# Patient Record
Sex: Female | Born: 1970 | Race: Black or African American | Hispanic: No | Marital: Married | Smoking: Never smoker
Health system: Southern US, Community
[De-identification: ages and names within clinical notes are randomized; demographics above are authoritative.]

## PROBLEM LIST (undated history)

## (undated) DIAGNOSIS — T8859XA Other complications of anesthesia, initial encounter: Secondary | ICD-10-CM

## (undated) DIAGNOSIS — N809 Endometriosis, unspecified: Secondary | ICD-10-CM

## (undated) DIAGNOSIS — M419 Scoliosis, unspecified: Secondary | ICD-10-CM

## (undated) DIAGNOSIS — D649 Anemia, unspecified: Secondary | ICD-10-CM

## (undated) DIAGNOSIS — G4700 Insomnia, unspecified: Secondary | ICD-10-CM

## (undated) DIAGNOSIS — IMO0002 Reserved for concepts with insufficient information to code with codable children: Secondary | ICD-10-CM

## (undated) DIAGNOSIS — K219 Gastro-esophageal reflux disease without esophagitis: Secondary | ICD-10-CM

## (undated) DIAGNOSIS — M797 Fibromyalgia: Secondary | ICD-10-CM

## (undated) DIAGNOSIS — T4145XA Adverse effect of unspecified anesthetic, initial encounter: Secondary | ICD-10-CM

## (undated) DIAGNOSIS — I499 Cardiac arrhythmia, unspecified: Secondary | ICD-10-CM

## (undated) DIAGNOSIS — E7800 Pure hypercholesterolemia, unspecified: Secondary | ICD-10-CM

## (undated) DIAGNOSIS — E282 Polycystic ovarian syndrome: Secondary | ICD-10-CM

## (undated) DIAGNOSIS — G8929 Other chronic pain: Secondary | ICD-10-CM

## (undated) DIAGNOSIS — R252 Cramp and spasm: Secondary | ICD-10-CM

## (undated) DIAGNOSIS — M1990 Unspecified osteoarthritis, unspecified site: Secondary | ICD-10-CM

## (undated) DIAGNOSIS — F419 Anxiety disorder, unspecified: Secondary | ICD-10-CM

## (undated) DIAGNOSIS — D219 Benign neoplasm of connective and other soft tissue, unspecified: Secondary | ICD-10-CM

## (undated) DIAGNOSIS — D573 Sickle-cell trait: Secondary | ICD-10-CM

## (undated) HISTORY — PX: BIOPSY BREAST: PRO8

## (undated) HISTORY — PX: WISDOM TOOTH EXTRACTION: SHX21

## (undated) HISTORY — DX: Endometriosis, unspecified: N80.9

## (undated) HISTORY — PX: LAPAROSCOPY: SHX197

---

## 1997-11-11 ENCOUNTER — Emergency Department (HOSPITAL_COMMUNITY): Admission: EM | Admit: 1997-11-11 | Discharge: 1997-11-11 | Payer: Self-pay | Admitting: Emergency Medicine

## 1998-07-02 ENCOUNTER — Other Ambulatory Visit: Admission: RE | Admit: 1998-07-02 | Discharge: 1998-07-02 | Payer: Self-pay | Admitting: Family Medicine

## 1999-08-10 ENCOUNTER — Other Ambulatory Visit: Admission: RE | Admit: 1999-08-10 | Discharge: 1999-08-10 | Payer: Self-pay | Admitting: *Deleted

## 2000-08-09 ENCOUNTER — Other Ambulatory Visit: Admission: RE | Admit: 2000-08-09 | Discharge: 2000-08-09 | Payer: Self-pay | Admitting: Family Medicine

## 2000-09-27 ENCOUNTER — Emergency Department (HOSPITAL_COMMUNITY): Admission: EM | Admit: 2000-09-27 | Discharge: 2000-09-27 | Payer: Self-pay | Admitting: Emergency Medicine

## 2000-09-28 ENCOUNTER — Encounter: Payer: Self-pay | Admitting: Emergency Medicine

## 2001-01-24 ENCOUNTER — Ambulatory Visit (HOSPITAL_COMMUNITY): Admission: RE | Admit: 2001-01-24 | Discharge: 2001-01-24 | Payer: Self-pay | Admitting: Obstetrics and Gynecology

## 2001-01-24 ENCOUNTER — Encounter (INDEPENDENT_AMBULATORY_CARE_PROVIDER_SITE_OTHER): Payer: Self-pay

## 2001-05-12 ENCOUNTER — Emergency Department (HOSPITAL_COMMUNITY): Admission: EM | Admit: 2001-05-12 | Discharge: 2001-05-13 | Payer: Self-pay | Admitting: Emergency Medicine

## 2001-05-13 ENCOUNTER — Encounter: Payer: Self-pay | Admitting: Emergency Medicine

## 2001-06-10 ENCOUNTER — Other Ambulatory Visit: Admission: RE | Admit: 2001-06-10 | Discharge: 2001-06-10 | Payer: Self-pay | Admitting: Obstetrics and Gynecology

## 2001-08-21 ENCOUNTER — Encounter: Admission: RE | Admit: 2001-08-21 | Discharge: 2001-11-19 | Payer: Self-pay | Admitting: Family Medicine

## 2001-08-29 ENCOUNTER — Emergency Department (HOSPITAL_COMMUNITY): Admission: EM | Admit: 2001-08-29 | Discharge: 2001-08-30 | Payer: Self-pay | Admitting: Emergency Medicine

## 2001-11-12 ENCOUNTER — Emergency Department (HOSPITAL_COMMUNITY): Admission: EM | Admit: 2001-11-12 | Discharge: 2001-11-12 | Payer: Self-pay | Admitting: Emergency Medicine

## 2001-11-12 ENCOUNTER — Encounter: Payer: Self-pay | Admitting: Emergency Medicine

## 2002-08-14 ENCOUNTER — Encounter: Payer: Self-pay | Admitting: Emergency Medicine

## 2002-08-14 ENCOUNTER — Emergency Department (HOSPITAL_COMMUNITY): Admission: EM | Admit: 2002-08-14 | Discharge: 2002-08-14 | Payer: Self-pay | Admitting: Emergency Medicine

## 2003-01-02 ENCOUNTER — Encounter: Admission: RE | Admit: 2003-01-02 | Discharge: 2003-01-02 | Payer: Self-pay | Admitting: Family Medicine

## 2003-01-02 ENCOUNTER — Encounter: Payer: Self-pay | Admitting: Family Medicine

## 2003-03-10 ENCOUNTER — Emergency Department (HOSPITAL_COMMUNITY): Admission: EM | Admit: 2003-03-10 | Discharge: 2003-03-10 | Payer: Self-pay | Admitting: Emergency Medicine

## 2003-04-04 ENCOUNTER — Emergency Department (HOSPITAL_COMMUNITY): Admission: EM | Admit: 2003-04-04 | Discharge: 2003-04-05 | Payer: Self-pay | Admitting: Emergency Medicine

## 2003-08-10 ENCOUNTER — Ambulatory Visit (HOSPITAL_COMMUNITY): Admission: RE | Admit: 2003-08-10 | Discharge: 2003-08-10 | Payer: Self-pay | Admitting: Obstetrics and Gynecology

## 2003-08-11 ENCOUNTER — Encounter: Admission: RE | Admit: 2003-08-11 | Discharge: 2003-11-09 | Payer: Self-pay | Admitting: Obstetrics and Gynecology

## 2004-06-15 ENCOUNTER — Emergency Department (HOSPITAL_COMMUNITY): Admission: EM | Admit: 2004-06-15 | Discharge: 2004-06-15 | Payer: Self-pay | Admitting: Emergency Medicine

## 2005-02-09 ENCOUNTER — Encounter: Admission: RE | Admit: 2005-02-09 | Discharge: 2005-03-06 | Payer: Self-pay | Admitting: Family Medicine

## 2005-02-15 ENCOUNTER — Ambulatory Visit (HOSPITAL_COMMUNITY): Admission: RE | Admit: 2005-02-15 | Discharge: 2005-02-15 | Payer: Self-pay | Admitting: Obstetrics and Gynecology

## 2005-05-23 ENCOUNTER — Emergency Department (HOSPITAL_COMMUNITY): Admission: EM | Admit: 2005-05-23 | Discharge: 2005-05-23 | Payer: Self-pay | Admitting: Emergency Medicine

## 2005-07-12 ENCOUNTER — Emergency Department (HOSPITAL_COMMUNITY): Admission: EM | Admit: 2005-07-12 | Discharge: 2005-07-13 | Payer: Self-pay | Admitting: Emergency Medicine

## 2005-09-05 ENCOUNTER — Encounter: Admission: RE | Admit: 2005-09-05 | Discharge: 2005-09-05 | Payer: Self-pay | Admitting: Sports Medicine

## 2005-09-21 ENCOUNTER — Encounter: Admission: RE | Admit: 2005-09-21 | Discharge: 2005-09-21 | Payer: Self-pay | Admitting: Sports Medicine

## 2005-10-12 ENCOUNTER — Encounter: Admission: RE | Admit: 2005-10-12 | Discharge: 2005-10-12 | Payer: Self-pay | Admitting: Sports Medicine

## 2006-05-01 HISTORY — PX: TOE SURGERY: SHX1073

## 2006-05-18 ENCOUNTER — Encounter: Admission: RE | Admit: 2006-05-18 | Discharge: 2006-05-18 | Payer: Self-pay | Admitting: Sports Medicine

## 2006-06-07 ENCOUNTER — Encounter: Admission: RE | Admit: 2006-06-07 | Discharge: 2006-06-07 | Payer: Self-pay | Admitting: Sports Medicine

## 2006-11-01 ENCOUNTER — Emergency Department (HOSPITAL_COMMUNITY): Admission: EM | Admit: 2006-11-01 | Discharge: 2006-11-01 | Payer: Self-pay | Admitting: Emergency Medicine

## 2006-12-24 ENCOUNTER — Emergency Department (HOSPITAL_COMMUNITY): Admission: EM | Admit: 2006-12-24 | Discharge: 2006-12-24 | Payer: Self-pay | Admitting: Family Medicine

## 2007-04-18 ENCOUNTER — Encounter: Admission: RE | Admit: 2007-04-18 | Discharge: 2007-04-18 | Payer: Self-pay | Admitting: Sports Medicine

## 2007-11-09 ENCOUNTER — Emergency Department (HOSPITAL_COMMUNITY): Admission: EM | Admit: 2007-11-09 | Discharge: 2007-11-09 | Payer: Self-pay | Admitting: Family Medicine

## 2007-11-12 ENCOUNTER — Other Ambulatory Visit: Admission: RE | Admit: 2007-11-12 | Discharge: 2007-11-12 | Payer: Self-pay | Admitting: Obstetrics and Gynecology

## 2007-12-14 ENCOUNTER — Emergency Department (HOSPITAL_COMMUNITY): Admission: EM | Admit: 2007-12-14 | Discharge: 2007-12-14 | Payer: Self-pay | Admitting: Family Medicine

## 2008-03-03 ENCOUNTER — Encounter: Admission: RE | Admit: 2008-03-03 | Discharge: 2008-03-03 | Payer: Self-pay | Admitting: Orthopaedic Surgery

## 2008-04-03 ENCOUNTER — Ambulatory Visit (HOSPITAL_COMMUNITY): Admission: RE | Admit: 2008-04-03 | Discharge: 2008-04-03 | Payer: Self-pay | Admitting: Obstetrics and Gynecology

## 2008-04-28 ENCOUNTER — Encounter: Admission: RE | Admit: 2008-04-28 | Discharge: 2008-04-28 | Payer: Self-pay | Admitting: Obstetrics and Gynecology

## 2008-05-01 HISTORY — PX: BACK SURGERY: SHX140

## 2008-05-27 ENCOUNTER — Encounter: Admission: RE | Admit: 2008-05-27 | Discharge: 2008-05-27 | Payer: Self-pay | Admitting: Orthopaedic Surgery

## 2008-07-12 ENCOUNTER — Emergency Department (HOSPITAL_COMMUNITY): Admission: EM | Admit: 2008-07-12 | Discharge: 2008-07-12 | Payer: Self-pay | Admitting: Family Medicine

## 2008-07-14 ENCOUNTER — Encounter: Admission: RE | Admit: 2008-07-14 | Discharge: 2008-07-14 | Payer: Self-pay | Admitting: Orthopaedic Surgery

## 2008-07-30 ENCOUNTER — Encounter: Admission: RE | Admit: 2008-07-30 | Discharge: 2008-07-30 | Payer: Self-pay | Admitting: Neurosurgery

## 2008-09-10 ENCOUNTER — Inpatient Hospital Stay (HOSPITAL_COMMUNITY): Admission: RE | Admit: 2008-09-10 | Discharge: 2008-09-14 | Payer: Self-pay | Admitting: Neurosurgery

## 2008-12-06 ENCOUNTER — Emergency Department (HOSPITAL_COMMUNITY): Admission: EM | Admit: 2008-12-06 | Discharge: 2008-12-06 | Payer: Self-pay | Admitting: Emergency Medicine

## 2009-04-29 ENCOUNTER — Encounter: Admission: RE | Admit: 2009-04-29 | Discharge: 2009-04-29 | Payer: Self-pay | Admitting: Obstetrics and Gynecology

## 2009-06-06 ENCOUNTER — Emergency Department (HOSPITAL_COMMUNITY): Admission: EM | Admit: 2009-06-06 | Discharge: 2009-06-06 | Payer: Self-pay | Admitting: Family Medicine

## 2009-06-23 ENCOUNTER — Emergency Department (HOSPITAL_COMMUNITY): Admission: EM | Admit: 2009-06-23 | Discharge: 2009-06-23 | Payer: Self-pay | Admitting: Family Medicine

## 2009-10-07 ENCOUNTER — Emergency Department (HOSPITAL_COMMUNITY): Admission: EM | Admit: 2009-10-07 | Discharge: 2009-10-07 | Payer: Self-pay | Admitting: Emergency Medicine

## 2009-12-02 ENCOUNTER — Emergency Department (HOSPITAL_COMMUNITY): Admission: EM | Admit: 2009-12-02 | Discharge: 2009-12-02 | Payer: Self-pay | Admitting: Emergency Medicine

## 2010-04-14 ENCOUNTER — Observation Stay (HOSPITAL_COMMUNITY)
Admission: EM | Admit: 2010-04-14 | Discharge: 2010-04-15 | Payer: Self-pay | Source: Home / Self Care | Attending: Internal Medicine | Admitting: Internal Medicine

## 2010-04-15 ENCOUNTER — Encounter (INDEPENDENT_AMBULATORY_CARE_PROVIDER_SITE_OTHER): Payer: Self-pay | Admitting: Internal Medicine

## 2010-05-22 ENCOUNTER — Encounter: Payer: Self-pay | Admitting: Sports Medicine

## 2010-05-22 ENCOUNTER — Encounter: Payer: Self-pay | Admitting: Obstetrics and Gynecology

## 2010-07-10 ENCOUNTER — Inpatient Hospital Stay (INDEPENDENT_AMBULATORY_CARE_PROVIDER_SITE_OTHER)
Admission: RE | Admit: 2010-07-10 | Discharge: 2010-07-10 | Disposition: A | Discharge status: Home / Self Care | Payer: BC Managed Care – PPO | Source: Ambulatory Visit | Attending: Family Medicine | Admitting: Family Medicine

## 2010-07-10 DIAGNOSIS — M549 Dorsalgia, unspecified: Secondary | ICD-10-CM

## 2010-07-11 LAB — COMPREHENSIVE METABOLIC PANEL
Alkaline Phosphatase: 110 U/L (ref 39–117)
BUN: 8 mg/dL (ref 6–23)
CO2: 24 mEq/L (ref 19–32)
Chloride: 102 mEq/L (ref 96–112)
GFR calc non Af Amer: >60 mL/min (ref >60)
Glucose, Bld: 432 mg/dL — ABNORMAL HIGH (ref 70–99)
Potassium: 4.3 mEq/L (ref 3.5–5.1)
Total Bilirubin: 0.3 mg/dL (ref 0.3–1.2)

## 2010-07-11 LAB — CBC
HCT: 38.4 % (ref 36.0–46.0)
MCV: 82.9 fL (ref 78.0–100.0)
RBC: 4.63 MIL/uL (ref 3.87–5.11)
WBC: 8.3 10*3/uL (ref 4.0–10.5)

## 2010-07-11 LAB — LIPID PANEL
LDL Cholesterol: 149 mg/dL — ABNORMAL HIGH (ref 0–99)
Total CHOL/HDL Ratio: 6 RATIO
VLDL: 25 mg/dL (ref 0–40)

## 2010-07-11 LAB — CK TOTAL AND CKMB (NOT AT ARMC)
Relative Index: INVALID (ref 0.0–2.5)
Total CK: 74 U/L (ref 7–177)

## 2010-07-11 LAB — CARDIAC PANEL(CRET KIN+CKTOT+MB+TROPI)
CK, MB: 1.1 ng/mL (ref 0.3–4.0)
Relative Index: INVALID (ref 0.0–2.5)
Relative Index: INVALID (ref 0.0–2.5)
Troponin I: 0.01 ng/mL (ref 0.00–0.06)
Troponin I: 0.01 ng/mL (ref 0.00–0.06)

## 2010-07-11 LAB — HEMOGLOBIN A1C
Hgb A1c MFr Bld: 10.9 % — ABNORMAL HIGH (ref <5.7)
Mean Plasma Glucose: 266 mg/dL — ABNORMAL HIGH (ref <117)

## 2010-07-11 LAB — GLUCOSE, CAPILLARY
Glucose-Capillary: 177 mg/dL — ABNORMAL HIGH (ref 70–99)
Glucose-Capillary: 280 mg/dL — ABNORMAL HIGH (ref 70–99)
Glucose-Capillary: 419 mg/dL — ABNORMAL HIGH (ref 70–99)

## 2010-07-11 LAB — POCT CARDIAC MARKERS: Myoglobin, poc: 59.5 ng/mL (ref 12–200)

## 2010-07-11 LAB — SAMPLE TO BLOOD BANK

## 2010-07-20 LAB — GLUCOSE, CAPILLARY

## 2010-08-06 LAB — CBC
Hemoglobin: 10.9 g/dL — ABNORMAL LOW (ref 12.0–15.0)
MCHC: 32 g/dL (ref 30.0–36.0)
MCV: 81.5 fL (ref 78.0–100.0)
RBC: 4.17 MIL/uL (ref 3.87–5.11)

## 2010-08-06 LAB — POCT I-STAT, CHEM 8
Calcium, Ion: 1.13 mmol/L (ref 1.12–1.32)
Chloride: 104 mEq/L (ref 96–112)
Glucose, Bld: 150 mg/dL — ABNORMAL HIGH (ref 70–99)
HCT: 34 % — ABNORMAL LOW (ref 36.0–46.0)
Hemoglobin: 11.6 g/dL — ABNORMAL LOW (ref 12.0–15.0)

## 2010-08-06 LAB — GLUCOSE, CAPILLARY

## 2010-08-06 LAB — URINALYSIS, ROUTINE W REFLEX MICROSCOPIC
Bilirubin Urine: NEGATIVE
Hgb urine dipstick: NEGATIVE
Specific Gravity, Urine: 1.013 (ref 1.005–1.030)
pH: 6 (ref 5.0–8.0)

## 2010-08-06 LAB — POCT CARDIAC MARKERS: Troponin i, poc: <0.05 ng/mL (ref 0.00–0.09)

## 2010-08-06 LAB — DIFFERENTIAL
Basophils Relative: 2 % — ABNORMAL HIGH (ref 0–1)
Eosinophils Absolute: 0.2 10*3/uL (ref 0.0–0.7)
Monocytes Absolute: 0.5 10*3/uL (ref 0.1–1.0)
Monocytes Relative: 6 % (ref 3–12)

## 2010-08-09 LAB — GLUCOSE, CAPILLARY
Glucose-Capillary: 124 mg/dL — ABNORMAL HIGH (ref 70–99)
Glucose-Capillary: 147 mg/dL — ABNORMAL HIGH (ref 70–99)
Glucose-Capillary: 138 mg/dL — ABNORMAL HIGH (ref 70–99)
Glucose-Capillary: 149 mg/dL — ABNORMAL HIGH (ref 70–99)
Glucose-Capillary: 166 mg/dL — ABNORMAL HIGH (ref 70–99)
Glucose-Capillary: 170 mg/dL — ABNORMAL HIGH (ref 70–99)
Glucose-Capillary: 173 mg/dL — ABNORMAL HIGH (ref 70–99)

## 2010-08-09 LAB — CBC
MCHC: 32.9 g/dL (ref 30.0–36.0)
Platelets: 387 10*3/uL (ref 150–400)
RBC: 4.33 MIL/uL (ref 3.87–5.11)
RDW: 16 % — ABNORMAL HIGH (ref 11.5–15.5)

## 2010-08-09 LAB — BASIC METABOLIC PANEL
BUN: 6 mg/dL (ref 6–23)
CO2: 26 mEq/L (ref 19–32)
Calcium: 9 mg/dL (ref 8.4–10.5)
Creatinine, Ser: 0.8 mg/dL (ref 0.4–1.2)
GFR calc Af Amer: >60 mL/min (ref >60)

## 2010-08-09 LAB — URINALYSIS, MICROSCOPIC ONLY
Nitrite: NEGATIVE
Protein, ur: NEGATIVE mg/dL
Specific Gravity, Urine: 1.014 (ref 1.005–1.030)
Urobilinogen, UA: 1 mg/dL (ref 0.0–1.0)

## 2010-08-09 LAB — PROTIME-INR
INR: 1 (ref 0.00–1.49)
Prothrombin Time: 13.2 seconds (ref 11.6–15.2)

## 2010-08-09 LAB — TYPE AND SCREEN: Antibody Screen: NEGATIVE

## 2010-09-03 ENCOUNTER — Emergency Department (HOSPITAL_COMMUNITY)
Admission: EM | Admit: 2010-09-03 | Discharge: 2010-09-03 | Disposition: A | Discharge status: Home / Self Care | Payer: BC Managed Care – PPO | Attending: Emergency Medicine | Admitting: Emergency Medicine

## 2010-09-03 DIAGNOSIS — M545 Low back pain, unspecified: Secondary | ICD-10-CM | POA: Insufficient documentation

## 2010-09-03 DIAGNOSIS — N390 Urinary tract infection, site not specified: Secondary | ICD-10-CM | POA: Insufficient documentation

## 2010-09-03 DIAGNOSIS — Z79899 Other long term (current) drug therapy: Secondary | ICD-10-CM | POA: Insufficient documentation

## 2010-09-03 DIAGNOSIS — E119 Type 2 diabetes mellitus without complications: Secondary | ICD-10-CM | POA: Insufficient documentation

## 2010-09-03 DIAGNOSIS — Z794 Long term (current) use of insulin: Secondary | ICD-10-CM | POA: Insufficient documentation

## 2010-09-03 LAB — POCT PREGNANCY, URINE: Preg Test, Ur: NEGATIVE

## 2010-09-03 LAB — URINALYSIS, ROUTINE W REFLEX MICROSCOPIC
Ketones, ur: NEGATIVE mg/dL
Leukocytes, UA: NEGATIVE
Nitrite: POSITIVE — AB
Protein, ur: NEGATIVE mg/dL

## 2010-09-05 LAB — URINE CULTURE
Colony Count: 75000
Culture  Setup Time: 201205061158

## 2010-09-13 NOTE — Op Note (Signed)
NAMERONESHIA, DREW               ACCOUNT NO.:  [id number]   MEDICAL RECORD NO.:  [id number]          PATIENT TYPE:  INP   LOCATION:  3025                         FACILITY:  [facility]   PHYSICIAN:  Clydene Fake, M.D.  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   PROCEDURES:  Decompressive laminectomy; decompression of the L5 and S1  roots (2 levels); posterior lumbar interbody fusion, L5-S1; Saber  interbody cages, L5-S1; Expedium pedicle screw fixation, nonsegmented,  L5-S1; posterolateral fusion, L5-S1; autograft, same incision; and  Infuse bone morphogenetic protein.   SURGEON:  Clydene Fake, MD   ASSISTANT:  Hilda Lias, MD   General endotracheal tube anesthesia.   ESTIMATED BLOOD LOSS:  150 cc   BLOOD GIVEN:  None.   DRAINS:  None.   COMPLICATIONS:  None.   REASON FOR PROCEDURE:  The patient is a 40 year old woman who has had a  long history of chronic back pain, treated with epidural injections, but  has had acute and sudden radiculopathy, right leg pain on top that with  increasing back pain.  She had an epidural injection for chronic pain  just prior to leg pain coming on and MRI was done showing severe  spondylitic changes at L5-S1, broad-based disk bulging, spurring, and  probably new acute disk herniation to the right side at L5-S1.  The  patient has multilevel spinal changes also.  After much discussion,  decided to bring the patient for decompressive laminectomy, diskectomy,  and fusion of L5-S1 level.   PROCEDURE IN DETAIL:  The patient was brought into the  operating room  and general anesthesia was induced.  The patient was placed in the prone  position on a Wilson frame with all pressure points padded.  The patient  was prepped and draped in sterile fashion.  The site of incision was  injected with 20 cc of 1% lidocaine with epinephrine.  Incision was  made.  Incision taken down to the fascia.  Hemostasis obtained with  Bovie cauterization.  Fascia was incised at L5 and S1 spinous process  and subperiosteal dissection was done over the lamina out to the facets,  markers were placed at the interspace.  X-rays were obtained confirming  our positioning at L5-S1 on the contralateral side.  We then did a  subperiosteal dissection and we dissected out laterally, so we can see  the facet at L5-S1, the transverse processes of both L5 and on the  lateral sacrum.  We brought fluoro and put markers at the pedicles and  the interspace again confirmed our positioning.  Decompressive  laminectomy was then done removing the spinous processes and most of the  lamina of L5 and medial facetectomy.  We decompressed the the central  canal and decompressed the  L5 and S1 roots, did a significant  foraminotomy over the S1 roots.  We explored disk space in front, broad-  based and severely spondylitic disk space and inside of the space.  We  started the diskectomy with pituitary rongeurs.  We removed the  osteophytes with osteophyte tools and curettes.  We were able to get  interspace clearing and distracted the interspace up to 7 mm in each  side and then continued the diskectomy with curettes, pituitary  rongeurs.  We spent significant amount of time trying to remove the bony  lip of the disk space that went off medially, working from each side.  Doing this we decompressed the nerve roots  Finally we still had a  midline ridge, but we had good decompression of nerve roots and thecal  sac.  We then explored the space through the axilla under the S1 root  and on  the right side found free fragment of disk and we removed this.  At this point, we had good decompression of the nerve roots.  We then  packed two 7-mm Saber cages with Infuse BNP and autograft bone, all the  bone that was removed during laminectomy, so this was cleaned from its  soft tissue, chopped in small pieces and this was then also packed in  the interspace, and then we tapped the Saber cages into place, one in  each side.  They were in good position, firmly in place and we used high-  speed drill to decorticate facet, lateral sacrum, transverse process of  L5 in each side, then used drill under fluoroscopic guidance to drill  entry point for pedicle screws for L5 and S1, and placed a probe down  the pedicle, tapped the hole, used a small ball probe to make sure we  had bony circumference.  We then placed pedicle screws, Expedium screws  were used and used 6-mm wide screws and 50-mm screws were used at L5, 45-  mm screw was used in the right side at S1 and 40-mm screw at the left  side.  After screws are placed, we took final AP and lateral  fluoroscopic imaging showing good position of the interbody cages and  pedicle screws.  We placed Infuse BNP and bone graft in the  posterolateral gutters for posterolateral fusion at L5 to S1, placed the  rods in the screw heads, placed locking nuts and then final tightened  the nuts over the all 4 screws.  Retractors were removed and again  explored dura, nerve roots, both L5 and S1 roots were well-decompressed.  Placed some Gelfoam over the lateral gutters so no bone graft would  impinge on the nerve roots and the fascia was closed with 0 Vicryl  interrupted sutures.  Subcutaneous tissue closed with 0, 2-0, and 3-0  Vicryl interrupted sutures.  Skin closed with Benzoin, Steri-Strips,  dressing was placed.  The patient was placed back in a supine position,  awoken from anesthesia, and transferred to recovery room in stable  condition.            ______________________________  Clydene Fake, M.D.     JRH/MEDQ  D:  09/10/2008  T:  09/11/2008  Job:  [id number]

## 2010-09-16 NOTE — Op Note (Signed)
[facility]  Patient:    Karen Stein, Karen Stein Visit Number: [id number] MRN: [id number]          Service Type: DSU Location: [facility] Attending Physician:  Jaymes Graff A Dictated by:   Pierre Bali Normand Sloop, M.D. Admit Date:  01/24/2001                             Operative Report  PREOPERATIVE DIAGNOSIS:       Chronic pelvic pain.  POSTOPERATIVE DIAGNOSES:      1. Multiple omental-abdominal wall adhesions.                               2. Implants in the posterior cul-de-sac and left                                  abdominal wall along the ureter consistent                                  with endometriosis.  PROCEDURES:                   1. Diagnostic laparoscopy.                               2. Biopsy of the posterior cul-de-sac and right                                  uterosacral ligament implants.  SURGEON:                      Naima A. Normand Sloop, M.D.  ASSISTANT:                    Maris Berger. Pennie Rushing, M.D.  ANESTHESIA:                   General.  ESTIMATED BLOOD LOSS:         Minimal.  IV FLUIDS:                    2100 cc of crystalloid.  URINE OUTPUT:                 100 cc clear urine.  FINDINGS:                     Small implants in the posterior cul-de-sac and right uterosacral ligaments.  Also a white implant consistent with endometriosis along the left ureter.  Normal-appearing appendix, right upper quadrant, gallbladder and liver.  Multiple thick omental-abdominal wall adhesions.  Normal-appearing uterus, tubes and ovaries bilaterally.  COMPLICATIONS:                None.  DISPOSITION:                  The patient to the recovery room in stable condition.  PROCEDURE IN DETAIL:          The risks and benefits of the procedure were reviewed with the patient.  The risk of bleeding, infection and damage to abdominal organs such as bowel and  bladder.  She still wished to proceed with the procedure.  The patient was taken to the  operating room and placed in the dorsal lithotomy position.  She was prepped and draped in the normal sterile fashion.  A bivalve speculum was then placed in the vagina.  The cervix was grasped with a single-tooth tenaculum.  The uterus was then sounded to 8 cm. The ______ was then placed after the sound was removed without difficulty as a means to manipulate the uterus.  Attention was then turned to the patients umbilicus, where a vertical 10-mm infraumbilical incision was made with a knife.  A Veress needle was placed at a 45-degree angle while tenting the abdominal wall.  Intra-abdominal placement was confirmed with fluid-filled syringe.  The abdomen was then insufflated with CO2 gas.  Upon starting the gas, the pressure was ______ mmHg.  The abdomen was insufflated with about 3.5 L of CO2 gas.  The Veress neelde was removed.  A 10-mm trocar was placed at a 45-degree angle while tenting the abdominal wall.  Intra-abomdinal placement was confirmed with the laparoscope.  The findings noted above were seen.  Multiple omental adhesions toward the patients left side an on down toward the lower abdomen.  They were dense adhesions and could not be taken down safely laparoscopically.  Maneuvering through the adhesions, I was to see both ovaries and tubes, which appeared normal.  The anterior cul-de-sac, which did have some small, filmy adhesions, was taken down, for the most part bluntly.  Normal bladder.  The posterior cul-de-sac and along the right uterosacral ligament, there were small, powderburn implants, each about 1 mm in size.  Biopsy was done on that side.  the laparoscope was then panned on the right side and we were able to find the appendix, which was normal in appearance.  The liver and gallbladder seemed normal in appearance.  Because of dense adhesions, we were able to see the stomach or further up in the left upper quadrant.  There was, however, a white implant along the ureter on  the left.  Also, there was a small amount of fluid in the cul-de-sac, which was drained.  Once the fluid was drained, there still was no abnormality seen except for the areas with endometriosis.  The biopsy was done with biopsy forceps and hemostasis was noted.  There were three biopsies taken.  No biopsies were taken of the left implant secondary to the placement right along the left ureter.  The CO2 gas was then allowed to escape from the trocar.  The trocar was removed under direct vision of the laparoscope.  There was noted to be no entrance into bowel just entrance into some adhesions, which were hemostatic.  No injury was noted.  The 10-mm port was closed using 0 Vicryl on a urology needle.  Hemostasis was assured.  The skin was closed with 3-0 Vicryl in a subcutaneous stitch.  Hemostasis was assured.  The ______ was removed and no serious bleeding from the vagina or from the tenaculum site. Sponge, lap, and needle counts were correct x 2.  The patient went to the recovery room in stable condition.  The patient will be offered Lupron before she leaves the hospital and, if she refuses, will be offered Lupron at her postoperative visit. Dictated by:   Pierre Bali. Normand Sloop, M.D. Attending Physician:  Michael Litter DD:  01/24/01 TD:  01/24/01 Job: [id number]

## 2010-09-16 NOTE — H&P (Signed)
[facility]  Patient:    Karen Stein, Karen Stein Visit Number: [id number] MRN: [id number]          Service Type: Attending:  Naima A. Normand Sloop, M.D. Dictated by:   Pierre Bali. Normand Sloop, M.D.                           History and Physical  HISTORY OF PRESENT ILLNESS:   The patient is a 40 year old African-American female G2, P0-1-1 who presents with chronic pelvic pain unrelieved by Naprosyn or other pain medications for 2-3 years.  The patient also complains of dyspareunia.  The pain is constant.  She missed one period in June 2002.  A pregnancy test was negative.  Ultrasound revealed a very small fibroid.  The patient has taken Tylenol and hydrocodone with Naprosyn off and on for one month without relief.  She does have some nausea, no vomiting, no change in bowel habits.  The patient denies any history of PID or sexually transmitted disease or history of abnormal Pap.  She does have dysmenorrhea.  PAST MEDICAL HISTORY:         Reflux for several years.  PAST SURGICAL HISTORY:        C-section in 1995.  ALLERGIES:                    SULFA.  FAMILY HISTORY:               Hypertension and diabetes.  No GYN cancer.  SOCIAL HISTORY:               Negative x 2 with occasional alcohol use.  PHYSICAL EXAMINATION:  VITAL SIGNS:                  On December 24, 2000, the patients blood pressure was 118/70, her weight was 240 pounds.  HEENT:                        Within normal limits.  HEART:                        Regular.  LUNGS:                        Clear.  ABDOMEN:                      Soft and nontender.  BACK:                         No CVA tenderness.  EXTREMITIES:                  No cyanosis, clubbing, or edema.  GENITOURINARY:                The patients ______ vaginal was within normal limits.  Cervix:  She had no CMT and no uterine tenderness.  Uterus was normal size and consistency, nontender.  Adnexa had no masses or  tenderness. GC/chlamydia as of April 2002 was within normal limits.  IMPRESSION:                   The patient has chronic pelvic pain was scheduled for a diagnostic laparoscopy.  The patient was given Naprosyn to help alleviate the pain.  I told her to stop if the reflux became worse.  At the visit, the patient was diagnosed with bacterial vaginosis and was treated with MetroGel.  The patient is for surgery on January 24, 2001. Dictated by:   Pierre Bali. Normand Sloop, M.D. Attending:  Naima A. Dillard, M.D. DD:  01/21/01 TD:  01/21/01 Job: [id number]

## 2010-09-16 NOTE — Discharge Summary (Signed)
Karen Stein, Karen Stein               ACCOUNT NO.:  [id number]   MEDICAL RECORD NO.:  [id number]          PATIENT TYPE:  INP   LOCATION:  3025                         FACILITY:  [facility]   PHYSICIAN:  Clydene Fake, M.D.  DATE OF BIRTH:  03-17-1971   DATE OF ADMISSION:  09/10/2008  DATE OF DISCHARGE:  09/14/2008                               DISCHARGE SUMMARY   DIAGNOSES:  Degenerative disk disease with herniated nucleus pulposus,  with radiculopathy and spondylosis, middle recess stenosis of L5-S1.   DISCHARGE DIAGNOSES:  Degenerative disk disease with herniated nucleus  pulposus, with radiculopathy and spondylosis, middle recess stenosis of  L5-S1.   PROCEDURE:  Decompression  laminectomy in L5-S1 roots with PLIF L5-S1  with Saber interbody cages, Expedium pedicle screw fixation and  posterolateral fusion with Infuse.   REASON FOR ADMISSION:  The patient is a 40 year old woman with long  history of chronic back pain treated with epidural injections,  had  sudden acute radiculopathy and right leg pain.  MRI was done showing  spondylotic changes at L5-S1 and new acute large disk herniation right  side L5-S1.  The patient was brought in for decompression and fusion at  L5-S1 level.   HOSPITAL COURSE:  The patient admitted today for surgery and underwent  the procedure without complications.  Postop, the patient was  transferred to the recovery room and then to the floor where she slowly  increased ambulating.  PT/OT to work in increasing her activity.  She  continued making progress.  Incision remained clean, dry, and intact;  switched from PCA meds to p.o. meds, but she continued improving and by  Sep 14, 2008, she had a bowel movement, and she was eating well, up  ambulating, some moderate incisional pain, but doing well, treated with  p.o. meds.  The patient was discharged to home in stable condition.  Discharge meds same as prehospitalization plus Percocet and Flexeril   p.r.n.   No strenuous activity, up with brace and follow up in 3-4 weeks in my  office.           ______________________________  Clydene Fake, M.D.     JRH/MEDQ  D:  10/08/2008  T:  10/08/2008  Job:  [id number]

## 2010-12-04 ENCOUNTER — Inpatient Hospital Stay (INDEPENDENT_AMBULATORY_CARE_PROVIDER_SITE_OTHER)
Admission: RE | Admit: 2010-12-04 | Discharge: 2010-12-04 | Disposition: A | Discharge status: Home / Self Care | Payer: BC Managed Care – PPO | Source: Ambulatory Visit | Attending: Family Medicine | Admitting: Family Medicine

## 2010-12-04 DIAGNOSIS — M549 Dorsalgia, unspecified: Secondary | ICD-10-CM

## 2010-12-04 DIAGNOSIS — E119 Type 2 diabetes mellitus without complications: Secondary | ICD-10-CM

## 2010-12-04 LAB — POCT URINALYSIS DIP (DEVICE)
Bilirubin Urine: NEGATIVE
Glucose, UA: >=1000 mg/dL — AB
Leukocytes, UA: NEGATIVE
Nitrite: NEGATIVE
Urobilinogen, UA: 0.2 mg/dL (ref 0.0–1.0)
pH: 5 (ref 5.0–8.0)

## 2010-12-04 LAB — POCT I-STAT, CHEM 8
Calcium, Ion: 1.13 mmol/L (ref 1.12–1.32)
Chloride: 100 mEq/L (ref 96–112)
HCT: 42 % (ref 36.0–46.0)
Hemoglobin: 14.3 g/dL (ref 12.0–15.0)

## 2010-12-21 ENCOUNTER — Emergency Department (HOSPITAL_COMMUNITY)
Admission: EM | Admit: 2010-12-21 | Discharge: 2010-12-21 | Disposition: A | Discharge status: Home / Self Care | Payer: BC Managed Care – PPO | Attending: Emergency Medicine | Admitting: Emergency Medicine

## 2010-12-21 ENCOUNTER — Emergency Department (HOSPITAL_COMMUNITY): Payer: BC Managed Care – PPO

## 2010-12-21 DIAGNOSIS — E669 Obesity, unspecified: Secondary | ICD-10-CM | POA: Insufficient documentation

## 2010-12-21 DIAGNOSIS — Z79899 Other long term (current) drug therapy: Secondary | ICD-10-CM | POA: Insufficient documentation

## 2010-12-21 DIAGNOSIS — G8929 Other chronic pain: Secondary | ICD-10-CM | POA: Insufficient documentation

## 2010-12-21 DIAGNOSIS — E119 Type 2 diabetes mellitus without complications: Secondary | ICD-10-CM | POA: Insufficient documentation

## 2010-12-21 DIAGNOSIS — M129 Arthropathy, unspecified: Secondary | ICD-10-CM | POA: Insufficient documentation

## 2010-12-21 DIAGNOSIS — M545 Low back pain, unspecified: Secondary | ICD-10-CM | POA: Insufficient documentation

## 2010-12-21 DIAGNOSIS — Z794 Long term (current) use of insulin: Secondary | ICD-10-CM | POA: Insufficient documentation

## 2010-12-26 ENCOUNTER — Other Ambulatory Visit: Payer: Self-pay | Admitting: *Deleted

## 2010-12-26 ENCOUNTER — Other Ambulatory Visit: Payer: Self-pay | Admitting: Obstetrics and Gynecology

## 2010-12-26 DIAGNOSIS — Z1231 Encounter for screening mammogram for malignant neoplasm of breast: Secondary | ICD-10-CM

## 2010-12-28 ENCOUNTER — Ambulatory Visit
Admission: RE | Admit: 2010-12-28 | Discharge: 2010-12-28 | Disposition: A | Discharge status: Home / Self Care | Payer: BC Managed Care – PPO | Source: Ambulatory Visit | Attending: Obstetrics and Gynecology | Admitting: Obstetrics and Gynecology

## 2010-12-28 DIAGNOSIS — Z1231 Encounter for screening mammogram for malignant neoplasm of breast: Secondary | ICD-10-CM

## 2010-12-29 ENCOUNTER — Other Ambulatory Visit: Payer: Self-pay | Admitting: Obstetrics and Gynecology

## 2010-12-29 DIAGNOSIS — R928 Other abnormal and inconclusive findings on diagnostic imaging of breast: Secondary | ICD-10-CM

## 2011-01-06 ENCOUNTER — Other Ambulatory Visit: Payer: Self-pay | Admitting: Obstetrics and Gynecology

## 2011-01-06 ENCOUNTER — Ambulatory Visit
Admission: RE | Admit: 2011-01-06 | Discharge: 2011-01-06 | Disposition: A | Discharge status: Home / Self Care | Payer: BC Managed Care – PPO | Source: Ambulatory Visit | Attending: Obstetrics and Gynecology | Admitting: Obstetrics and Gynecology

## 2011-01-06 DIAGNOSIS — R928 Other abnormal and inconclusive findings on diagnostic imaging of breast: Secondary | ICD-10-CM

## 2011-02-07 ENCOUNTER — Other Ambulatory Visit: Payer: Self-pay | Admitting: Neurosurgery

## 2011-02-07 DIAGNOSIS — M792 Neuralgia and neuritis, unspecified: Secondary | ICD-10-CM

## 2011-02-14 LAB — POCT CARDIAC MARKERS
CKMB, poc: <1 — ABNORMAL LOW
Myoglobin, poc: 97

## 2011-02-14 LAB — BASIC METABOLIC PANEL
CO2: 25
Chloride: 105
GFR calc Af Amer: >60
Potassium: 3.7
Sodium: 136

## 2011-02-15 ENCOUNTER — Ambulatory Visit
Admission: RE | Admit: 2011-02-15 | Discharge: 2011-02-15 | Disposition: A | Discharge status: Home / Self Care | Payer: BC Managed Care – PPO | Source: Ambulatory Visit | Attending: Neurosurgery | Admitting: Neurosurgery

## 2011-02-15 DIAGNOSIS — M792 Neuralgia and neuritis, unspecified: Secondary | ICD-10-CM

## 2011-02-15 MED ORDER — GADOBENATE DIMEGLUMINE 529 MG/ML IV SOLN
20.0000 mL | Freq: Once | INTRAVENOUS | Status: AC | PRN
  Administered 2011-02-15: 20 mL via INTRAVENOUS

## 2011-04-06 ENCOUNTER — Observation Stay (HOSPITAL_COMMUNITY)
Admission: EM | Admit: 2011-04-06 | Discharge: 2011-04-06 | Disposition: A | Discharge status: Home / Self Care | Payer: BC Managed Care – PPO | Attending: Emergency Medicine | Admitting: Emergency Medicine

## 2011-04-06 ENCOUNTER — Encounter (HOSPITAL_COMMUNITY): Payer: Self-pay | Admitting: *Deleted

## 2011-04-06 DIAGNOSIS — M5136 Other intervertebral disc degeneration, lumbar region: Secondary | ICD-10-CM | POA: Insufficient documentation

## 2011-04-06 DIAGNOSIS — IMO0002 Reserved for concepts with insufficient information to code with codable children: Secondary | ICD-10-CM | POA: Insufficient documentation

## 2011-04-06 DIAGNOSIS — E119 Type 2 diabetes mellitus without complications: Principal | ICD-10-CM | POA: Insufficient documentation

## 2011-04-06 DIAGNOSIS — D573 Sickle-cell trait: Secondary | ICD-10-CM | POA: Insufficient documentation

## 2011-04-06 DIAGNOSIS — R739 Hyperglycemia, unspecified: Secondary | ICD-10-CM

## 2011-04-06 HISTORY — DX: Sickle-cell trait: D57.3

## 2011-04-06 HISTORY — DX: Reserved for concepts with insufficient information to code with codable children: IMO0002

## 2011-04-06 HISTORY — DX: Unspecified osteoarthritis, unspecified site: M19.90

## 2011-04-06 LAB — URINALYSIS, ROUTINE W REFLEX MICROSCOPIC
Glucose, UA: >1000 mg/dL — AB
Leukocytes, UA: NEGATIVE
Nitrite: NEGATIVE
Protein, ur: NEGATIVE mg/dL
pH: 6 (ref 5.0–8.0)

## 2011-04-06 LAB — URINE MICROSCOPIC-ADD ON

## 2011-04-06 LAB — GLUCOSE, CAPILLARY
Glucose-Capillary: 392 mg/dL — ABNORMAL HIGH (ref 70–99)
Glucose-Capillary: 411 mg/dL — ABNORMAL HIGH (ref 70–99)
Glucose-Capillary: 354 mg/dL — ABNORMAL HIGH (ref 70–99)
Glucose-Capillary: 265 mg/dL — ABNORMAL HIGH (ref 70–99)
Glucose-Capillary: 187 mg/dL — ABNORMAL HIGH (ref 70–99)

## 2011-04-06 LAB — POCT I-STAT, CHEM 8
BUN: 17 mg/dL (ref 6–23)
Calcium, Ion: 1.17 mmol/L (ref 1.12–1.32)
Glucose, Bld: 431 mg/dL — ABNORMAL HIGH (ref 70–99)
HCT: 39 % (ref 36.0–46.0)
TCO2: 25 mmol/L (ref 0–100)

## 2011-04-06 MED ORDER — INSULIN ASPART 100 UNIT/ML ~~LOC~~ SOLN
10.0000 [IU] | Freq: Once | SUBCUTANEOUS | Status: AC
  Administered 2011-04-06: 10 [IU] via SUBCUTANEOUS
  Filled 2011-04-06: qty 1

## 2011-04-06 MED ORDER — INSULIN ASPART 100 UNIT/ML ~~LOC~~ SOLN
10.0000 [IU] | Freq: Once | SUBCUTANEOUS | Status: AC
  Administered 2011-04-06: 10 [IU] via SUBCUTANEOUS

## 2011-04-06 MED ORDER — SODIUM CHLORIDE 0.9 % IV SOLN: Freq: Once | INTRAVENOUS | Status: DC

## 2011-04-06 MED ORDER — SODIUM CHLORIDE 0.9 % IV SOLN
INTRAVENOUS | Status: DC
  Administered 2011-04-06: 2.5 [IU]/h via INTRAVENOUS
  Filled 2011-04-06: qty 1

## 2011-04-06 MED ORDER — DEXTROSE-NACL 5-0.45 % IV SOLN: INTRAVENOUS | Status: DC

## 2011-04-06 MED ORDER — SODIUM CHLORIDE 0.9 % IV SOLN
Freq: Once | INTRAVENOUS | Status: AC
  Administered 2011-04-06: 11:00:00 via INTRAVENOUS

## 2011-04-06 MED ORDER — INSULIN ASPART 100 UNIT/ML ~~LOC~~ SOLN
SUBCUTANEOUS | Status: AC
  Filled 2011-04-06: qty 1

## 2011-04-06 MED ORDER — INSULIN REGULAR BOLUS VIA INFUSION
0.0000 [IU] | Freq: Three times a day (TID) | INTRAVENOUS | Status: DC
  Filled 2011-04-06 (×2): qty 10

## 2011-04-06 MED ORDER — SODIUM CHLORIDE 0.9 % IV SOLN
INTRAVENOUS | Status: DC
  Administered 2011-04-06: 17:00:00 via INTRAVENOUS

## 2011-04-06 MED ORDER — DEXTROSE 50 % IV SOLN: 25.0000 mL | INTRAVENOUS | Status: DC | PRN

## 2011-04-06 NOTE — ED Notes (Signed)
Pt CBG was 337

## 2011-04-06 NOTE — ED Notes (Signed)
Vital signs stable. 

## 2011-04-06 NOTE — Progress Notes (Signed)
Observation review completed. 

## 2011-04-06 NOTE — ED Provider Notes (Signed)
History     CSN: [id number] Arrival date & time: 04/06/2011  9:24 AM   First MD Initiated Contact with Patient 04/06/11 1708      Chief Complaint  Patient presents with  . Hyperglycemia    (Consider location/radiation/quality/duration/timing/severity/associated sxs/prior treatment) HPI  Past Medical History  Diagnosis Date  . Diabetes mellitus   . Arthritis   . Degenerative disc disease   . Degenerative disc disease   . Sickle cell trait   . DDD (degenerative disc disease)     Past Surgical History  Procedure Date  . Back surgery   . Cesarean section     History reviewed. No pertinent family history.  History  Substance Use Topics  . Smoking status: Never Smoker   . Smokeless tobacco: Not on file  . Alcohol Use: Yes     twice anually    OB History    Grav Para Term Preterm Abortions TAB SAB Ect Mult Living                  Review of Systems  Allergies  Shellfish allergy and Sulfa antibiotics  Home Medications   Current Outpatient Rx  Name Route Sig Dispense Refill  . INSULIN ISOPHANE & REGULAR (70-30) 100 UNIT/ML [place] SUSP Subcutaneous Inject 30 Units into the skin 3 (three) times daily with meals.     Marland Kitchen PRESCRIPTION MEDICATION Intramuscular Inject 1 each into the muscle once.      Marland Kitchen TIZANIDINE HCL 4 MG PO TABS Oral Take 4 mg by mouth every 6 (six) hours as needed. Back pain       BP 144/85  Pulse 86  Temp(Src) 98.3 F (36.8 C) (Oral)  Resp 20  Ht 5\' 8"  (1.727 m)  Wt 239 lb (108.41 kg)  BMI 36.34 kg/m2  SpO2 99%  LMP 03/05/2011  Physical Exam  ED Course  Procedures (including critical care time)  Labs Reviewed  GLUCOSE, CAPILLARY - Abnormal; Notable for the following:    Glucose-Capillary 392 (*)    All other components within normal limits  GLUCOSE, CAPILLARY - Abnormal; Notable for the following:    Glucose-Capillary 411 (*)    All other components within normal limits  POCT I-STAT, CHEM 8 - Abnormal; Notable for the following:    Glucose, Bld 431 (*)    All other components within normal limits  GLUCOSE, CAPILLARY - Abnormal; Notable for the following:    Glucose-Capillary 354 (*)    All other components within normal limits  GLUCOSE, CAPILLARY - Abnormal; Notable for the following:    Glucose-Capillary 337 (*)    All other components within normal limits  URINALYSIS, ROUTINE W REFLEX MICROSCOPIC - Abnormal; Notable for the following:    Glucose, UA >1000 (*)    Hgb urine dipstick TRACE (*)    All other components within normal limits  URINE MICROSCOPIC-ADD ON - Abnormal; Notable for the following:    Squamous Epithelial / LPF MANY (*)    Bacteria, UA MANY (*)    All other components within normal limits  GLUCOSE, CAPILLARY - Abnormal; Notable for the following:    Glucose-Capillary 310 (*)    All other components within normal limits  POCT CBG MONITORING  I-STAT, CHEM 8  POCT CBG MONITORING  POCT CBG MONITORING   No results found.   No diagnosis found.    MDM  Arrived in CDU, blood sugar remains at 310 - stable on glucomander      Repeat blood sugar now,  is 159 - has eaten, will discharge home.  Feels markedly improved - instructed to do glucose checks between meals tomorrow and use her regular coverage scale.  Izola Price Berger, [place] 04/06/11 2146

## 2011-04-06 NOTE — ED Notes (Signed)
Informed patient and/or family of status.  

## 2011-04-06 NOTE — ED Provider Notes (Signed)
Medical screening examination/treatment/procedure(s) were performed by non-physician practitioner and as supervising physician I was immediately available for consultation/collaboration.   Hartlee Amedee L Debhora Titus, MD 04/06/11 2348 

## 2011-04-06 NOTE — ED Notes (Signed)
Pt. Ate 100 % of dinner, no s/s of hypoglycemia or hyper glycemia.

## 2011-04-06 NOTE — ED Provider Notes (Signed)
History     CSN: [id number] Arrival date & time: 04/06/2011  9:24 AM   None     Chief Complaint  Patient presents with  . Hyperglycemia    (Consider location/radiation/quality/duration/timing/severity/associated sxs/prior treatment) HPI  Past Medical History  Diagnosis Date  . Diabetes mellitus   . Arthritis   . Degenerative disc disease   . Degenerative disc disease   . Sickle cell trait   . DDD (degenerative disc disease)     Past Surgical History  Procedure Date  . Back surgery   . Cesarean section     No family history on file.  History  Substance Use Topics  . Smoking status: Never Smoker   . Smokeless tobacco: Not on file  . Alcohol Use: Yes     twice anually    OB History    Grav Para Term Preterm Abortions TAB SAB Ect Mult Living                  Review of Systems  Allergies  Shellfish allergy and Sulfa antibiotics  Home Medications   Current Outpatient Rx  Name Route Sig Dispense Refill  . INSULIN ISOPHANE & REGULAR (70-30) 100 UNIT/ML [place] SUSP Subcutaneous Inject into the skin.        BP 140/85  Pulse 107  Temp(Src) 97.3 F (36.3 C) (Oral)  Resp 18  SpO2 99%  LMP 03/05/2011  Physical Exam  ED Course  Procedures (including critical care time)  Labs Reviewed  GLUCOSE, CAPILLARY - Abnormal; Notable for the following:    Glucose-Capillary 392 (*)    All other components within normal limits  POCT CBG MONITORING   CBG 411 at 10:53, 10 units novolog given at same time  Glucose 431 on istat at 11:19.  AG 11 on istat, so no evidence of ketoacidosis  Hyperglycemia: Likely related to steroid injection in back yesterday.  Glucose still high at 337 at 13:47, despite 10 units novolog subQ x 2 so far.  Will transfer her to ED CDU for observation and place her on glucostabilizer protocol to lower her sugars, with goal glucose <250, ideally <200.     MDM  I discussed all aspects of this case with my attending Dr. Ignacia Palma.  He agreed  with the workup and plan of care.    I spoke with her new outpatient PCP Dr. Velna Hatchet at [facility].  She saw Ms. Neisen for the first time yesterday.  Labwork drawn yesterday is still pending, no results back.  Dr. Allyne Gee is aware that Ms. Julien Girt came to the ED today for high sugars and wishes to see patient for follow-up next week to check sugar and monitor glucose.  Once sugar is down today, will discharge patient with plan to follow-up with Dr. Allyne Gee next week.        Blanca Friend, MD 04/06/11 1356

## 2011-04-06 NOTE — ED Notes (Signed)
Pt cbg <250. Ok to d/c insulin drip per PA

## 2011-04-06 NOTE — ED Notes (Signed)
Patient is resting comfortably. 

## 2011-04-06 NOTE — ED Notes (Signed)
Had steroid injection in back yesterday, c/o high blood sugar since then. Reports running 400's. C/o feeling lightheaded.

## 2011-04-06 NOTE — ED Notes (Signed)
Patient denies pain and is resting comfortably.  

## 2011-09-05 ENCOUNTER — Emergency Department (HOSPITAL_COMMUNITY): Payer: BC Managed Care – PPO

## 2011-09-05 ENCOUNTER — Encounter (HOSPITAL_COMMUNITY): Payer: Self-pay | Admitting: *Deleted

## 2011-09-05 ENCOUNTER — Emergency Department (HOSPITAL_COMMUNITY)
Admission: EM | Admit: 2011-09-05 | Discharge: 2011-09-06 | Disposition: A | Discharge status: Home / Self Care | Payer: BC Managed Care – PPO | Attending: Emergency Medicine | Admitting: Emergency Medicine

## 2011-09-05 DIAGNOSIS — R0989 Other specified symptoms and signs involving the circulatory and respiratory systems: Secondary | ICD-10-CM | POA: Insufficient documentation

## 2011-09-05 DIAGNOSIS — R079 Chest pain, unspecified: Secondary | ICD-10-CM | POA: Insufficient documentation

## 2011-09-05 DIAGNOSIS — M129 Arthropathy, unspecified: Secondary | ICD-10-CM | POA: Insufficient documentation

## 2011-09-05 DIAGNOSIS — Z79899 Other long term (current) drug therapy: Secondary | ICD-10-CM | POA: Insufficient documentation

## 2011-09-05 DIAGNOSIS — R0609 Other forms of dyspnea: Secondary | ICD-10-CM | POA: Insufficient documentation

## 2011-09-05 DIAGNOSIS — R0602 Shortness of breath: Secondary | ICD-10-CM | POA: Insufficient documentation

## 2011-09-05 DIAGNOSIS — E119 Type 2 diabetes mellitus without complications: Secondary | ICD-10-CM | POA: Insufficient documentation

## 2011-09-05 DIAGNOSIS — R000 Tachycardia, unspecified: Secondary | ICD-10-CM | POA: Insufficient documentation

## 2011-09-05 DIAGNOSIS — Z794 Long term (current) use of insulin: Secondary | ICD-10-CM | POA: Insufficient documentation

## 2011-09-05 HISTORY — DX: Anxiety disorder, unspecified: F41.9

## 2011-09-05 LAB — DIFFERENTIAL
Basophils Relative: 0 % (ref 0–1)
Eosinophils Absolute: 0.1 10*3/uL (ref 0.0–0.7)
Monocytes Absolute: 0.6 10*3/uL (ref 0.1–1.0)
Monocytes Relative: 7 % (ref 3–12)

## 2011-09-05 LAB — POCT I-STAT TROPONIN I: Troponin i, poc: 0.01 ng/mL (ref 0.00–0.08)

## 2011-09-05 LAB — COMPREHENSIVE METABOLIC PANEL
Albumin: 3.1 g/dL — ABNORMAL LOW (ref 3.5–5.2)
BUN: 8 mg/dL (ref 6–23)
Calcium: 9 mg/dL (ref 8.4–10.5)
Creatinine, Ser: 0.73 mg/dL (ref 0.50–1.10)
Total Bilirubin: 0.3 mg/dL (ref 0.3–1.2)
Total Protein: 8.2 g/dL (ref 6.0–8.3)

## 2011-09-05 LAB — CK TOTAL AND CKMB (NOT AT ARMC)
CK, MB: 1.3 ng/mL (ref 0.3–4.0)
Total CK: 44 U/L (ref 7–177)

## 2011-09-05 LAB — CBC
HCT: 34.7 % — ABNORMAL LOW (ref 36.0–46.0)
Hemoglobin: 11.7 g/dL — ABNORMAL LOW (ref 12.0–15.0)
MCH: 27.1 pg (ref 26.0–34.0)
MCHC: 33.7 g/dL (ref 30.0–36.0)

## 2011-09-05 LAB — TROPONIN I: Troponin I: <0.3 ng/mL (ref <0.30)

## 2011-09-05 MED ORDER — IOHEXOL 350 MG/ML SOLN
100.0000 mL | Freq: Once | INTRAVENOUS | Status: AC | PRN
  Administered 2011-09-05: 100 mL via INTRAVENOUS

## 2011-09-05 NOTE — ED Notes (Signed)
Patient with chest pain and shortness of breath and cough.  Patient continues with pain in chest when she coughs.

## 2011-09-05 NOTE — ED Notes (Signed)
Patient returned from CT

## 2011-09-05 NOTE — ED Provider Notes (Signed)
History     CSN: [id number]  Arrival date & time 09/05/11  1730   First MD Initiated Contact with Patient 09/05/11 1857      Chief Complaint  Patient presents with  . Chest Pain  . Shortness of Breath    (Consider location/radiation/quality/duration/timing/severity/associated sxs/prior treatment) HPI The patient presents with concerns of chest pain and lightheadedness.  She notes that prior to one week ago she was in her usual state of health.  Since that time she has had intermittent ability increasingly more present pain focally about her sternum.  The pain is described as a tightness, there is associated dyspnea.  The dyspnea and the pain worse with deep inspiration and coughing.  It is not an exertional component.  The patient denies any syncope, and that she is lightheaded.  She denies any nausea, vomiting, diarrhea, fevers, chills. Past Medical History  Diagnosis Date  . Diabetes mellitus   . Arthritis   . Degenerative disc disease   . Degenerative disc disease   . Sickle cell trait   . DDD (degenerative disc disease)   . Anxiety     Past Surgical History  Procedure Date  . Back surgery   . Cesarean section     No family history on file.  History  Substance Use Topics  . Smoking status: Never Smoker   . Smokeless tobacco: Not on file  . Alcohol Use: Yes     twice anually    OB History    Grav Para Term Preterm Abortions TAB SAB Ect Mult Living                  Review of Systems  Constitutional:       HPI  HENT:       HPI otherwise negative  Eyes: Negative.   Respiratory:       HPI, otherwise negative  Cardiovascular:       HPI, otherwise nmegative  Gastrointestinal: Negative for vomiting.  Genitourinary:       HPI, otherwise negative  Musculoskeletal:       HPI, otherwise negative  Skin: Negative.   Neurological: Negative for syncope.    Allergies  Shellfish allergy and Sulfa antibiotics  Home Medications   Current Outpatient Rx  Name  Route Sig Dispense Refill  . AMITRIPTYLINE HCL 10 MG PO TABS Oral Take 10 mg by mouth at bedtime. For pain    . INSULIN DETEMIR 100 UNIT/ML [place] SOLN Subcutaneous Inject 15 Units into the skin at bedtime.    Marland Kitchen METFORMIN HCL 500 MG PO TABS Oral Take 1,000 mg by mouth 2 (two) times daily with a meal.    . NITROFURANTOIN MONOHYD MACRO 100 MG PO CAPS Oral Take 100 mg by mouth 2 (two) times daily.    Marland Kitchen SITAGLIPTIN PHOSPHATE 100 MG PO TABS Oral Take 100 mg by mouth 2 (two) times daily.    Marland Kitchen TAPENTADOL HCL 50 MG PO TABS Oral Take 50 mg by mouth at bedtime as needed. For back pain      BP 133/84  Pulse 116  Temp(Src) 98.3 F (36.8 C) (Oral)  Resp 21  SpO2 100%  LMP 08/31/2011  Physical Exam  Nursing note and vitals reviewed. Constitutional: She is oriented to person, place, and time. She appears well-developed and well-nourished. No distress.  HENT:  Head: Normocephalic and atraumatic.  Eyes: Conjunctivae and EOM are normal.  Cardiovascular: Regular rhythm.  Tachycardia present.   Pulmonary/Chest: Effort normal and breath sounds normal.  No stridor. No respiratory distress.  Abdominal: She exhibits no distension.  Musculoskeletal: She exhibits no edema.  Neurological: She is alert and oriented to person, place, and time. No cranial nerve deficit.  Skin: Skin is warm and dry.  Psychiatric: She has a normal mood and affect.    ED Course  Procedures (including critical care time)  Labs Reviewed  CBC - Abnormal; Notable for the following:    Hemoglobin 11.7 (*)    HCT 34.7 (*)    Platelets 479 (*)    All other components within normal limits  COMPREHENSIVE METABOLIC PANEL - Abnormal; Notable for the following:    Sodium 134 (*)    Glucose, Bld 162 (*)    Albumin 3.1 (*)    All other components within normal limits  DIFFERENTIAL  CK TOTAL AND CKMB  TROPONIN I  POCT I-STAT TROPONIN I  D-DIMER, QUANTITATIVE   Dg Chest 2 View  09/05/2011  *RADIOLOGY REPORT*  Clinical Data: Chest  pain.  Short of breath.  CHEST - 2 VIEW  Comparison: 04/14/2010.  Findings: Chronic elevation of the right hemidiaphragm with right basilar atelectasis.  There is no airspace disease.  No effusion. Cardiopericardial silhouette and mediastinal contours are within normal limits.  IMPRESSION: Chronic elevation of the right hemidiaphragm with associated atelectasis.  No acute cardiopulmonary disease.  Original Report Authenticated By: Andreas Newport, M.D.     No diagnosis found.  The patient has cardiac monitor 110 sinus tach abnormal Pulse oximetry 100% room air normal   Date: 09/06/2011  Rate: 116  Rhythm: sinus tachycardia  QRS Axis: right  Intervals: normal  ST/T Wave abnormalities: nonspecific T wave changes  Conduction Disutrbances:none  Narrative Interpretation:   Old EKG Reviewed: changes noted ABNORMAL  12:05 AM Patient is ambulatory  MDM  This young female presents with chest pain.  Notably, the patient also complains of dyspnea and vague dysesthesia.  On exam the patient is in no distress, but given the patient's tachycardia, tachypnea, there is some suspicion for PE.  The patient is not per negative.  The patient's femoral is positive, and all the remainder of her labs are largely reassuring she had CT angiography performed.  CT did not demonstrate acute PE or other notable findings.  The patient was discharged in stable condition to continue evaluation of her pain with her primary care physician.        Gerhard Munch, MD 09/06/11 [phone]

## 2011-09-05 NOTE — ED Notes (Signed)
The has had mid-chest pain for over a week.  At present she is hyperventilating c/o numbness and tingling and dizziness with the pain.  lmp April 9th

## 2011-09-05 NOTE — ED Notes (Signed)
Patient transported to CT 

## 2011-09-06 MED ORDER — TRAMADOL HCL 50 MG PO TABS: 50.0000 mg | ORAL_TABLET | Freq: Four times a day (QID) | ORAL | Status: AC | PRN

## 2011-09-06 NOTE — Discharge Instructions (Signed)
Chest Pain (Nonspecific) It is often hard to give a specific diagnosis for the cause of chest pain. There is always a chance that your pain could be related to something serious, such as a heart attack or a blood clot in the lungs. You need to follow up with your caregiver for further evaluation. CAUSES   Heartburn.   Pneumonia or bronchitis.   Anxiety or stress.   Inflammation around your heart (pericarditis) or lung (pleuritis or pleurisy).   A blood clot in the lung.   A collapsed lung (pneumothorax). It can develop suddenly on its own (spontaneous pneumothorax) or from injury (trauma) to the chest.   Shingles infection (herpes zoster virus).  The chest wall is composed of bones, muscles, and cartilage. Any of these can be the source of the pain.  The bones can be bruised by injury.   The muscles or cartilage can be strained by coughing or overwork.   The cartilage can be affected by inflammation and become sore (costochondritis).  DIAGNOSIS  Lab tests or other studies, such as X-rays, electrocardiography, stress testing, or cardiac imaging, may be needed to find the cause of your pain.  TREATMENT   Treatment depends on what may be causing your chest pain. Treatment may include:   Acid blockers for heartburn.   Anti-inflammatory medicine.   Pain medicine for inflammatory conditions.   Antibiotics if an infection is present.   You may be advised to change lifestyle habits. This includes stopping smoking and avoiding alcohol, caffeine, and chocolate.   You may be advised to keep your head raised (elevated) when sleeping. This reduces the chance of acid going backward from your stomach into your esophagus.   Most of the time, nonspecific chest pain will improve within 2 to 3 days with rest and mild pain medicine.  HOME CARE INSTRUCTIONS   If antibiotics were prescribed, take your antibiotics as directed. Finish them even if you start to feel better.   For the next few  days, avoid physical activities that bring on chest pain. Continue physical activities as directed.   Do not smoke.   Avoid drinking alcohol.   Only take over-the-counter or prescription medicine for pain, discomfort, or fever as directed by your caregiver.   Follow your caregiver's suggestions for further testing if your chest pain does not go away.   Keep any follow-up appointments you made. If you do not go to an appointment, you could develop lasting (chronic) problems with pain. If there is any problem keeping an appointment, you must call to reschedule.  SEEK MEDICAL CARE IF:   You think you are having problems from the medicine you are taking. Read your medicine instructions carefully.   Your chest pain does not go away, even after treatment.   You develop a rash with blisters on your chest.  SEEK IMMEDIATE MEDICAL CARE IF:   You have increased chest pain or pain that spreads to your arm, neck, jaw, back, or abdomen.   You develop shortness of breath, an increasing cough, or you are coughing up blood.   You have severe back or abdominal pain, feel nauseous, or vomit.   You develop severe weakness, fainting, or chills.   You have a fever.  THIS IS AN EMERGENCY. Do not wait to see if the pain will go away. Get medical help at once. Call your local emergency services (911 in [place] Do not drive yourself to the hospital. MAKE SURE YOU:   Understand these instructions.     Will watch your condition.   Will get help right away if you are not doing well or get worse.  Document Released: 01/25/2005 Document Revised: 04/06/2011 Document Reviewed: 11/21/2007 Exit[employer] Patient Information 2012 [employer]. 

## 2011-09-07 ENCOUNTER — Encounter: Payer: Self-pay | Admitting: Obstetrics and Gynecology

## 2011-09-21 ENCOUNTER — Encounter: Payer: Self-pay | Admitting: Obstetrics and Gynecology

## 2011-11-06 ENCOUNTER — Other Ambulatory Visit: Payer: Self-pay | Admitting: Obstetrics and Gynecology

## 2011-11-06 DIAGNOSIS — N6019 Diffuse cystic mastopathy of unspecified breast: Secondary | ICD-10-CM

## 2011-11-08 ENCOUNTER — Other Ambulatory Visit: Payer: Self-pay | Admitting: Internal Medicine

## 2011-11-08 DIAGNOSIS — N6019 Diffuse cystic mastopathy of unspecified breast: Secondary | ICD-10-CM

## 2011-11-10 ENCOUNTER — Other Ambulatory Visit: Payer: BC Managed Care – PPO

## 2011-11-16 ENCOUNTER — Ambulatory Visit
Admission: RE | Admit: 2011-11-16 | Discharge: 2011-11-16 | Disposition: A | Discharge status: Home / Self Care | Payer: BC Managed Care – PPO | Source: Ambulatory Visit | Attending: Obstetrics and Gynecology | Admitting: Obstetrics and Gynecology

## 2011-11-16 DIAGNOSIS — N6019 Diffuse cystic mastopathy of unspecified breast: Secondary | ICD-10-CM

## 2011-11-17 ENCOUNTER — Encounter: Payer: Self-pay | Admitting: Obstetrics and Gynecology

## 2011-11-23 ENCOUNTER — Ambulatory Visit (INDEPENDENT_AMBULATORY_CARE_PROVIDER_SITE_OTHER): Payer: BC Managed Care – PPO | Admitting: Obstetrics and Gynecology

## 2011-11-23 ENCOUNTER — Encounter: Payer: Self-pay | Admitting: Obstetrics and Gynecology

## 2011-11-23 VITALS — BP 114/70 | Temp 98.5°F | Wt 228.0 lb

## 2011-11-23 DIAGNOSIS — IMO0001 Reserved for inherently not codable concepts without codable children: Secondary | ICD-10-CM

## 2011-11-23 DIAGNOSIS — Z2089 Contact with and (suspected) exposure to other communicable diseases: Secondary | ICD-10-CM

## 2011-11-23 DIAGNOSIS — Z202 Contact with and (suspected) exposure to infections with a predominantly sexual mode of transmission: Secondary | ICD-10-CM

## 2011-11-23 DIAGNOSIS — Z309 Encounter for contraceptive management, unspecified: Secondary | ICD-10-CM

## 2011-11-23 NOTE — Patient Instructions (Signed)
Sterilization, Women Sterilization is a surgical procedure. This surgery permanently prevents pregnancy in women. This can be done by tying (with or without cutting) the fallopian tubes or burning the tubes closed (tubal ligation). Tubal ligation blocks the tubes and prevents the egg from being fertilized by the sperm. Sterilization can be done by removing the ovaries that produce the egg (castration) as well. Sterilization is considered safe with very rare complications. It does not affect menstrual periods, sexual desire, or performance.  Since sterilization is considered permanent, you should not do it until you are sure you do not want to have more children. You and your partner should fully agree to have the procedure. Your decision to have the procedure should not be made when you are in a stressful situation. This can include a loss of a pregnancy, illness or death of a spouse, or divorce. There are other means of preventing unwanted pregnancies that can be used until you are completely sure you want to be sterilized. Sterilization does not protect against sexually transmitted disease. Women who had a sterilization procedure and want it reversed must know that it requires an expensive and major operation. The reversal may not be successful and has a high rate of tubal (ectopic) pregnancy that can be dangerous and require surgery. There are several ways to perform a tubal sterlization:  Laparoscopy. The abdomen is filled with a gas to see the pelvic organs. Then, a tube with a light attached is inserted into the abdomen through 2 small incisions. The fallopian tubes are blocked with a ring, clip or electrocautery to burn closed the tubes. Then, the gas is released and the small incisions are closed.   Hysteroscopy. A tube with a light is inserted in the vagina, through the cervix and then into the uterus. A spring-like instrument is inserted into the opening of the fallopian tubes. The spring causes  scaring and blocks the tubes. Other forms of contraception should be used for three months at which time an X-ray is done to be sure the tubes are blocked.   Minilaparotomy. This is done right after giving birth. A small incision is made under the belly button and the tubes are exposed. The tubes can then be burned, tied and/or cut.   Tubal ligation can be done during a Cesarean section.   Castration is a surgical procedure that removes both ovaries.  Tubal sterilization should be discussed with your caregiver to answer any concerns you or your partner might have. This meeting will help to decide for sure if the operation is safe for you and which procedure is the best one for you. You can change your mind and cancel the surgery at any time. HOME CARE INSTRUCTIONS   Follow your caregivers instructions regarding diet, rest, work, social and sexual activities and follow up appointments.   Shoulder pain is common following a laparoscopy. The pain may be relieved by lying down flat.   Only take over-the-counter or prescription medicines for pain, discomfort or fever as directed by your caregiver.   You may use lozenges for throat discomfort.   Keep the incisions covered to prevent infection.  SEEK IMMEDIATE MEDICAL CARE IF:   You develop a temperature of 102 F (38.9 C), or as your caregiver suggests.   You become dizzy or faint.   You start to feel sick to your stomach (nausea) or throw up (vomit).   You develop abdominal pain not relieved with over-the-counter medications.   You have redness and puffiness (  swelling) of the cut (incision).   You see pus draining from the incision.   You miss a menstrual period.  Document Released: 10/04/2007 Document Revised: 04/06/2011 Document Reviewed: 10/04/2007 Exit[employer] Patient Information 2012 [employer]. 

## 2011-11-23 NOTE — Progress Notes (Signed)
Pt here today to discuss tubal ligation. Pt given [employer] pamphlet for Sterilization for Men and Women. 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   Pt denies ever having an outbreak.  She desires to be tested.  She also desires to have a tubal ligation Past Medical History  Diagnosis Date  . Diabetes mellitus   . Arthritis   . Degenerative disc disease   . Degenerative disc disease   . Sickle cell trait   . DDD (degenerative disc disease)   . Anxiety   . Endometriosis    Past Surgical History  Procedure Date  . Back surgery   . Cesarean section   . Laparoscopy 2002 or 2003  . Wisdom tooth extraction    PRN Meds:.   Family History  Problem Relation Age of Onset  . Diabetes Paternal Grandfather   . Hypertension Paternal Grandfather   . Hyperlipidemia Paternal Grandfather   . Diabetes Paternal Grandmother   . Hypertension Paternal Grandmother   . Hyperlipidemia Paternal Grandmother   . Alzheimer's disease Maternal Grandmother   . Brain cancer Maternal Grandfather   . Diabetes Father   . Hypertension Father   . Hyperlipidemia Father    History   Social History  . Marital Status: Married    Spouse Name: N/A    Number of Children: N/A  . Years of Education: N/A   Social History Main Topics  . Smoking status: Never Smoker   . Smokeless tobacco: None  . Alcohol Use: No     twice anually  . Drug Use: No  . Sexually Active: Yes   Other Topics Concern  . None   Social History Narrative  . None  Pt without complaints Physical Examination: General appearance - alert, well appearing, and in no distress Mental status - normal mood, behavior, speech, dress, motor activity, and thought processes Neck - supple, no significant adenopathy, thyroid exam: thyroid is normal in size without nodules or tenderness Chest - clear to auscultation, no wheezes, rales or rhonchi, symmetric air entry Heart  - normal rate and regular rhythm Abdomen - soft, nontender, nondistended, no masses or organomegaly Breasts - breasts appear normal, no suspicious masses, no skin or nipple changes or axillary nodes Pelvic - normal external genitalia, vulva, vagina, cervix, uterus and adnexa Rectal - normal rectal, no masses, rectal exam not indicated Back exam - full range of motion, no tenderness, palpable spasm or pain on motion Neurological - alert, oriented, normal speech, no focal findings or movement disorder noted Musculoskeletal - no joint tenderness, deformity or swelling Extremities - no edema, redness or tenderness in the calves or thighs Skin - normal coloration and turgor, no rashes, no suspicious skin lesions noted Routine exam Pap sent no Mammogram due no pt desires sterilization.  L/S BTL and Essure reviewed with the pt.  she will call with the decision RT

## 2011-11-24 ENCOUNTER — Telehealth: Payer: Self-pay | Admitting: Obstetrics and Gynecology

## 2011-11-24 LAB — HSV 2 ANTIBODY, IGG: HSV 2 Glycoprotein G Ab, IgG: 5.1 IV — ABNORMAL HIGH

## 2011-11-24 NOTE — Telephone Encounter (Signed)
NICCOLE/RES

## 2011-11-27 NOTE — Telephone Encounter (Signed)
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

## 2011-11-27 NOTE — Telephone Encounter (Signed)
Lm on vm tcb rgd labs 

## 2011-12-23 ENCOUNTER — Emergency Department (INDEPENDENT_AMBULATORY_CARE_PROVIDER_SITE_OTHER)
Admission: EM | Admit: 2011-12-23 | Discharge: 2011-12-23 | Disposition: A | Discharge status: Home / Self Care | Payer: BC Managed Care – PPO | Source: Home / Self Care | Attending: Emergency Medicine | Admitting: Emergency Medicine

## 2011-12-23 ENCOUNTER — Encounter (HOSPITAL_COMMUNITY): Payer: Self-pay

## 2011-12-23 DIAGNOSIS — N390 Urinary tract infection, site not specified: Secondary | ICD-10-CM

## 2011-12-23 DIAGNOSIS — M25569 Pain in unspecified knee: Secondary | ICD-10-CM

## 2011-12-23 DIAGNOSIS — IMO0002 Reserved for concepts with insufficient information to code with codable children: Secondary | ICD-10-CM

## 2011-12-23 DIAGNOSIS — M25561 Pain in right knee: Secondary | ICD-10-CM

## 2011-12-23 LAB — POCT URINALYSIS DIP (DEVICE)
Ketones, ur: NEGATIVE mg/dL
Protein, ur: NEGATIVE mg/dL
Specific Gravity, Urine: 1.01 (ref 1.005–1.030)

## 2011-12-23 MED ORDER — HYDROCODONE-ACETAMINOPHEN 5-325 MG PO TABS
ORAL_TABLET | ORAL | Status: AC
  Filled 2011-12-23: qty 2

## 2011-12-23 MED ORDER — KETOROLAC TROMETHAMINE 60 MG/2ML IM SOLN
60.0000 mg | Freq: Once | INTRAMUSCULAR | Status: AC
  Administered 2011-12-23: 60 mg via INTRAMUSCULAR

## 2011-12-23 MED ORDER — CEPHALEXIN 500 MG PO CAPS: 500.0000 mg | ORAL_CAPSULE | Freq: Three times a day (TID) | ORAL | Status: AC

## 2011-12-23 MED ORDER — HYDROCODONE-ACETAMINOPHEN 5-325 MG PO TABS
2.0000 | ORAL_TABLET | Freq: Once | ORAL | Status: AC
  Administered 2011-12-23: 2 via ORAL

## 2011-12-23 MED ORDER — CYCLOBENZAPRINE HCL 5 MG PO TABS: 5.0000 mg | ORAL_TABLET | Freq: Three times a day (TID) | ORAL | Status: AC | PRN

## 2011-12-23 MED ORDER — DICLOFENAC SODIUM 75 MG PO TBEC: 75.0000 mg | DELAYED_RELEASE_TABLET | Freq: Two times a day (BID) | ORAL | Status: DC

## 2011-12-23 MED ORDER — OXYCODONE-ACETAMINOPHEN 5-325 MG PO TABS: ORAL_TABLET | ORAL | Status: AC

## 2011-12-23 MED ORDER — KETOROLAC TROMETHAMINE 60 MG/2ML IM SOLN
INTRAMUSCULAR | Status: AC
  Filled 2011-12-23: qty 2

## 2011-12-23 NOTE — ED Notes (Signed)
Pt has back pain that extends from her upper back to lower back that started one week ago after moving her daughter in her dorm, she also has rt knee pain.  No known injury.

## 2011-12-23 NOTE — ED Provider Notes (Signed)
Chief Complaint  Patient presents with  . Back Pain    History of Present Illness:   The patient is a 41 year old female who has had a three-year history of lower back pain. She had back surgery in 2010 done by Dr. Phoebe Perch. She's had lower back pain since then and been on Nucynta since then. She continues to see Dr. Phoebe Perch in followup. Her back was at its baseline status up until about a week ago when she helped her daughter move into a dorm at [employer]. She had long car ride back home to [place]. Ever since then her back pain has been severe rated 10+ over 10 in intensity. It feels like a shock like pain or sharp pain and is worse with any kind of movement such as getting in and out of the car, twisting, or bending. Despite that she's been working every day. The pain is localized to her neck on down to her feet. Her right knee feels like it's about to give way. She has some aching in the right knee. She denies any numbness or tingling in the legs, bladder or bowel complaints. Specifically, she denies dysuria, frequency, urgency, or hematuria. She's had no fever, chills, weight loss. She is a diabetic and is on Levemir and metformin. She also takes an antidepressant. She is allergic to sulfa.  Review of Systems:  Other than noted above, the patient denies any of the following symptoms: Systemic:  No fever, chills, fatigue, or weight loss. GI:  No abdominal pain, nausea, vomiting, diarrhea, constipation or blood in stool. GU:  No dysuria, frequency, urgency, or hematuria. No incontinence or difficulty urinating.  M-S:  No neck pain, joint pain, arthritis, or myalgias. Neuro:  No parethesias or muscular weakness. Skin:  No rash or itching.   PMFSH:  Past medical history, family history, social history, meds, and allergies were reviewed.  Physical Exam:   Vital signs:  BP 158/89  Pulse 104  Temp 98.7 F (37.1 C) (Oral)  Resp 20  SpO2 100%  LMP 12/08/2011 General:  Alert, oriented,  in no distress. Abdomen:  Soft, non-tender.  No organomegaly or mass.  No pulsatile midline abdominal mass or bruit. Back:  Her back was diffusely tender to palpation and had just a few degrees of motion in all directions with pain. Straight leg raising produces some lower back pain, but her muscles are very tight. Neuro:  Normal muscle strength, sensations and DTRs. Extremities: Pedal pulses were full, there was no edema. Exam of the knee reveals no swelling or pain to palpation. The knee has a full range of motion with no pain and no crepitus. McMurray sign was negative, Lachman's sign was negative, anterior and posterior drawer signs were negative, and varus and valgus stress were negative. Skin:  Clear, warm and dry.  No rash.  Labs:   Results for orders placed during the hospital encounter of 12/23/11  POCT URINALYSIS DIP (DEVICE)      Component Value Range   Glucose, UA 500 (*) NEGATIVE mg/dL   Bilirubin Urine NEGATIVE  NEGATIVE   Ketones, ur NEGATIVE  NEGATIVE mg/dL   Specific Gravity, Urine 1.010  1.005 - 1.030   Hgb urine dipstick TRACE (*) NEGATIVE   pH 5.5  5.0 - 8.0   Protein, ur NEGATIVE  NEGATIVE mg/dL   Urobilinogen, UA 0.2  0.0 - 1.0 mg/dL   Nitrite POSITIVE (*) NEGATIVE   Leukocytes, UA NEGATIVE  NEGATIVE    Other Labs Obtained at Urgent  Care Center:  A urine culture was obtained.  Results are pending at this time and we will call about any positive results.  Course in Urgent Care Center:   She was given Toradol 60 mg IM and Norco 5/325, 2 by mouth for pain and tolerated these well without any immediate side effects.  Assessment:  The primary encounter diagnosis was Degenerative disc disease. Diagnoses of Right knee pain and UTI (lower urinary tract infection) were also pertinent to this visit.  Plan:   1.  The following meds were prescribed:   New Prescriptions   CEPHALEXIN (KEFLEX) 500 MG CAPSULE    Take 1 capsule (500 mg total) by mouth 3 (three) times daily.    CYCLOBENZAPRINE (FLEXERIL) 5 MG TABLET    Take 1 tablet (5 mg total) by mouth 3 (three) times daily as needed for muscle spasms.   DICLOFENAC (VOLTAREN) 75 MG EC TABLET    Take 1 tablet (75 mg total) by mouth 2 (two) times daily.   OXYCODONE-ACETAMINOPHEN (PERCOCET) 5-325 MG PER TABLET    1 to 2 tablets every 6 hours as needed for pain.   2.  The patient was instructed in symptomatic care and handouts were given. 3.  The patient was told to return if becoming worse in any way, if no better in 2 weeks, and given some red flag symptoms that would indicate earlier return. 4.  The patient was encouraged to try to be as active as possible and given some exercises to do followed by moist heat. She was encouraged to followup with Dr. Phoebe Perch next week.    Reuben Likes, MD 12/23/11 1315

## 2011-12-25 LAB — URINE CULTURE: Colony Count: >=100000

## 2011-12-25 NOTE — ED Notes (Signed)
Urine culture: >100,000 colonies E. Coli.  Pt. adequately treated with Keflex. Karen Stein 12/25/2011

## 2012-02-25 ENCOUNTER — Emergency Department (HOSPITAL_COMMUNITY)
Admission: EM | Admit: 2012-02-25 | Discharge: 2012-02-26 | Disposition: A | Discharge status: Home / Self Care | Payer: BC Managed Care – PPO | Attending: Emergency Medicine | Admitting: Emergency Medicine

## 2012-02-25 ENCOUNTER — Encounter (HOSPITAL_COMMUNITY): Payer: Self-pay | Admitting: *Deleted

## 2012-02-25 DIAGNOSIS — N809 Endometriosis, unspecified: Secondary | ICD-10-CM | POA: Insufficient documentation

## 2012-02-25 DIAGNOSIS — M4120 Other idiopathic scoliosis, site unspecified: Secondary | ICD-10-CM | POA: Insufficient documentation

## 2012-02-25 DIAGNOSIS — R739 Hyperglycemia, unspecified: Secondary | ICD-10-CM

## 2012-02-25 DIAGNOSIS — M129 Arthropathy, unspecified: Secondary | ICD-10-CM | POA: Insufficient documentation

## 2012-02-25 DIAGNOSIS — R350 Frequency of micturition: Secondary | ICD-10-CM | POA: Insufficient documentation

## 2012-02-25 DIAGNOSIS — Z79899 Other long term (current) drug therapy: Secondary | ICD-10-CM | POA: Insufficient documentation

## 2012-02-25 DIAGNOSIS — Z794 Long term (current) use of insulin: Secondary | ICD-10-CM | POA: Insufficient documentation

## 2012-02-25 DIAGNOSIS — R7309 Other abnormal glucose: Secondary | ICD-10-CM | POA: Insufficient documentation

## 2012-02-25 DIAGNOSIS — D573 Sickle-cell trait: Secondary | ICD-10-CM | POA: Insufficient documentation

## 2012-02-25 DIAGNOSIS — IMO0002 Reserved for concepts with insufficient information to code with codable children: Secondary | ICD-10-CM | POA: Insufficient documentation

## 2012-02-25 DIAGNOSIS — F411 Generalized anxiety disorder: Secondary | ICD-10-CM | POA: Insufficient documentation

## 2012-02-25 DIAGNOSIS — E119 Type 2 diabetes mellitus without complications: Secondary | ICD-10-CM | POA: Insufficient documentation

## 2012-02-25 DIAGNOSIS — R5381 Other malaise: Secondary | ICD-10-CM | POA: Insufficient documentation

## 2012-02-25 HISTORY — DX: Scoliosis, unspecified: M41.9

## 2012-02-25 LAB — GLUCOSE, CAPILLARY: Glucose-Capillary: 328 mg/dL — ABNORMAL HIGH (ref 70–99)

## 2012-02-25 NOTE — ED Notes (Signed)
Pt reports that her blood sugar was > 400 at home this pm; She states that she is supposed to take 1000mg  Glucophage in the evening but took 1500mg  this evening; denies increased thirst or increase in urination; pt reports that she has been having trouble with her blood sugar being greater than 200 for several months; denies any other symptoms currently.

## 2012-02-26 ENCOUNTER — Encounter (HOSPITAL_COMMUNITY): Payer: Self-pay | Admitting: Emergency Medicine

## 2012-02-26 LAB — CBC WITH DIFFERENTIAL/PLATELET
Basophils Absolute: 0 10*3/uL (ref 0.0–0.1)
Basophils Relative: 1 % (ref 0–1)
Eosinophils Absolute: 0.1 K/uL (ref 0.0–0.7)
Eosinophils Relative: 2 % (ref 0–5)
HCT: 33.4 % — ABNORMAL LOW (ref 36.0–46.0)
Hemoglobin: 10.8 g/dL — ABNORMAL LOW (ref 12.0–15.0)
Lymphocytes Relative: 44 % (ref 12–46)
Lymphs Abs: 3.3 K/uL (ref 0.7–4.0)
MCH: 25.7 pg — ABNORMAL LOW (ref 26.0–34.0)
MCHC: 32.3 g/dL (ref 30.0–36.0)
MCV: 79.5 fL (ref 78.0–100.0)
Monocytes Absolute: 0.3 10*3/uL (ref 0.1–1.0)
Monocytes Relative: 4 % (ref 3–12)
Neutro Abs: 3.8 10*3/uL (ref 1.7–7.7)
Neutrophils Relative %: 51 % (ref 43–77)
Platelets: 451 10*3/uL — ABNORMAL HIGH (ref 150–400)
RBC: 4.2 MIL/uL (ref 3.87–5.11)
RDW: 14 % (ref 11.5–15.5)
WBC: 7.6 10*3/uL (ref 4.0–10.5)

## 2012-02-26 LAB — URINALYSIS, ROUTINE W REFLEX MICROSCOPIC
Bilirubin Urine: NEGATIVE
Glucose, UA: >1000 mg/dL — AB
Hgb urine dipstick: NEGATIVE
Ketones, ur: NEGATIVE mg/dL
Leukocytes, UA: NEGATIVE
Nitrite: NEGATIVE
Protein, ur: NEGATIVE mg/dL
Specific Gravity, Urine: 1.021 (ref 1.005–1.030)
Urobilinogen, UA: 0.2 mg/dL (ref 0.0–1.0)
pH: 6 (ref 5.0–8.0)

## 2012-02-26 LAB — COMPREHENSIVE METABOLIC PANEL WITH GFR
AST: 11 U/L (ref 0–37)
Albumin: 3.3 g/dL — ABNORMAL LOW (ref 3.5–5.2)
Calcium: 8.6 mg/dL (ref 8.4–10.5)
Chloride: 100 meq/L (ref 96–112)
Creatinine, Ser: 0.84 mg/dL (ref 0.50–1.10)
Total Bilirubin: 0.2 mg/dL — ABNORMAL LOW (ref 0.3–1.2)

## 2012-02-26 LAB — COMPREHENSIVE METABOLIC PANEL
ALT: 8 U/L (ref 0–35)
Alkaline Phosphatase: 86 U/L (ref 39–117)
BUN: 10 mg/dL (ref 6–23)
CO2: 27 mEq/L (ref 19–32)
GFR calc Af Amer: >90 mL/min (ref >90)
GFR calc non Af Amer: 85 mL/min — ABNORMAL LOW (ref >90)
Glucose, Bld: 296 mg/dL — ABNORMAL HIGH (ref 70–99)
Potassium: 3.8 mEq/L (ref 3.5–5.1)
Sodium: 134 mEq/L — ABNORMAL LOW (ref 135–145)
Total Protein: 7.7 g/dL (ref 6.0–8.3)

## 2012-02-26 LAB — GLUCOSE, CAPILLARY
Glucose-Capillary: 232 mg/dL — ABNORMAL HIGH (ref 70–99)
Glucose-Capillary: 184 mg/dL — ABNORMAL HIGH (ref 70–99)

## 2012-02-26 LAB — URINE MICROSCOPIC-ADD ON

## 2012-02-26 MED ORDER — SODIUM CHLORIDE 0.9 % IV BOLUS (SEPSIS)
1000.0000 mL | Freq: Once | INTRAVENOUS | Status: AC
  Administered 2012-02-26: 1000 mL via INTRAVENOUS

## 2012-02-26 MED ORDER — INSULIN ASPART 100 UNIT/ML ~~LOC~~ SOLN: 8.0000 [IU] | Freq: Once | SUBCUTANEOUS | Status: DC

## 2012-02-26 MED ORDER — INSULIN REGULAR HUMAN 100 UNIT/ML IJ SOLN: 8.0000 [IU] | Freq: Once | INTRAMUSCULAR | Status: DC

## 2012-02-26 NOTE — ED Provider Notes (Signed)
History     CSN: [id number]  Arrival date & time 02/25/12  2202   First MD Initiated Contact with Patient 02/25/12 2341      Chief Complaint  Patient presents with  . Hyperglycemia    (Consider location/radiation/quality/duration/timing/severity/associated sxs/prior treatment) HPI Comments: Pt with h/o DM for more than 10 years, now on 3 meds for DM, reports over past few months sugars have been trending higher, she think she may need to be on insulin soon.   Her PCP is Dr. Allyne Gee.  This evening, her sugar was over 400, so came to the ED.  Feels thirsty, has polyuria, but denies dysuria, N/V, fevers, cold symptoms, cough, diarrhea.  No sig change in diet.  No CP, SOB.   No new numbness or weakness.  She notes some itching and dry skin to feet, worse on left side. She has tried some fungal cream in the past with no sig improvement.    The history is provided by the patient.    Past Medical History  Diagnosis Date  . Diabetes mellitus   . Arthritis   . Degenerative disc disease   . Degenerative disc disease   . Sickle cell trait   . DDD (degenerative disc disease)   . Anxiety   . Endometriosis   . Scoliosis   . DDD (degenerative disc disease)     Past Surgical History  Procedure Date  . Back surgery   . Cesarean section   . Laparoscopy 2002 or 2003  . Wisdom tooth extraction   . Toe surgery     Family History  Problem Relation Age of Onset  . Diabetes Paternal Grandfather   . Hypertension Paternal Grandfather   . Hyperlipidemia Paternal Grandfather   . Diabetes Paternal Grandmother   . Hypertension Paternal Grandmother   . Hyperlipidemia Paternal Grandmother   . Alzheimer's disease Maternal Grandmother   . Brain cancer Maternal Grandfather   . Diabetes Father   . Hypertension Father   . Hyperlipidemia Father     History  Substance Use Topics  . Smoking status: Never Smoker   . Smokeless tobacco: Not on file  . Alcohol Use: No     twice anually    OB  History    Grav Para Term Preterm Abortions TAB SAB Ect Mult Living   4 2 1 1 2  2   1       Review of Systems  Constitutional: Positive for fatigue. Negative for fever, chills, appetite change and unexpected weight change.  Cardiovascular: Negative for chest pain.  Gastrointestinal: Negative for nausea, vomiting, abdominal pain and diarrhea.  Genitourinary: Positive for frequency. Negative for dysuria.  All other systems reviewed and are negative.    Allergies  Shellfish allergy and Sulfa antibiotics  Home Medications   Current Outpatient Rx  Name Route Sig Dispense Refill  . AMITRIPTYLINE HCL 10 MG PO TABS Oral Take 10 mg by mouth at bedtime. For pain    . DICLOFENAC SODIUM 75 MG PO TBEC Oral Take 1 tablet (75 mg total) by mouth 2 (two) times daily. 20 tablet 0  . INSULIN DETEMIR 100 UNIT/ML [place] SOLN Subcutaneous Inject 15 Units into the skin at bedtime.    Marland Kitchen METFORMIN HCL 500 MG PO TABS Oral Take 1,000 mg by mouth 2 (two) times daily with a meal.    . SITAGLIPTIN PHOSPHATE 100 MG PO TABS Oral Take 100 mg by mouth 2 (two) times daily.    Marland Kitchen TAPENTADOL HCL 50 MG  PO TABS Oral Take 50 mg by mouth at bedtime as needed. For back pain.    Marland Kitchen TAPENTADOL HCL ER 100 MG PO TB12 Oral Take 1 tablet by mouth daily. Pt uses medication for during the day      BP 129/75  Pulse 93  Temp 99.1 F (37.3 C) (Oral)  Resp 18  Ht 5\' 8"  (1.727 m)  Wt 260 lb (117.935 kg)  BMI 39.53 kg/m2  SpO2 100%  LMP 02/02/2012  Physical Exam  Nursing note and vitals reviewed. Constitutional: She is oriented to person, place, and time. She appears well-developed and well-nourished.  HENT:  Head: Normocephalic and atraumatic.  Eyes: Pupils are equal, round, and reactive to light.  Neck: Normal range of motion. Neck supple.  Cardiovascular: Normal rate and regular rhythm.   Pulmonary/Chest: Effort normal. No respiratory distress. She has no wheezes.  Abdominal: Soft. She exhibits no distension. There is no  tenderness.  Musculoskeletal: Normal range of motion. She exhibits no edema and no tenderness.       Right foot: Normal.       Left foot: Normal. She exhibits normal range of motion, no tenderness, no bony tenderness, no swelling, no deformity and no laceration.  Neurological: She is alert and oriented to person, place, and time. No cranial nerve deficit. Coordination normal.  Skin: Skin is warm.    ED Course  Procedures (including critical care time)  Labs Reviewed  GLUCOSE, CAPILLARY - Abnormal; Notable for the following:    Glucose-Capillary 328 (*)     All other components within normal limits  COMPREHENSIVE METABOLIC PANEL - Abnormal; Notable for the following:    Sodium 134 (*)     Glucose, Bld 296 (*)     Albumin 3.3 (*)     Total Bilirubin 0.2 (*)     GFR calc non Af Amer 85 (*)     All other components within normal limits  CBC WITH DIFFERENTIAL - Abnormal; Notable for the following:    Hemoglobin 10.8 (*)     HCT 33.4 (*)     MCH 25.7 (*)     Platelets 451 (*)     All other components within normal limits  URINALYSIS, ROUTINE W REFLEX MICROSCOPIC - Abnormal; Notable for the following:    APPearance CLOUDY (*)     Glucose, UA >1000 (*)     All other components within normal limits  GLUCOSE, CAPILLARY - Abnormal; Notable for the following:    Glucose-Capillary 232 (*)     All other components within normal limits  URINE MICROSCOPIC-ADD ON - Abnormal; Notable for the following:    Squamous Epithelial / LPF MANY (*)     Bacteria, UA MANY (*)     All other components within normal limits  GLUCOSE, CAPILLARY - Abnormal; Notable for the following:    Glucose-Capillary 184 (*)     All other components within normal limits   No results found.   1. Hyperglycemia without ketosis      ra sat is 100% and i interpret to be normal  Sugar improved to <250.  After 2nd liter, glucose now 184.  Stable for discharge to home.     MDM  Will give IVF's.  CMP shows glucose  is actually 296.  No evidecne of DKA.  anion gap is only 7.  Not in DKA, mildly dehydrated, possibly.  Not toxic appearing.  Pt understands to discuss with PCP this week to consider starting insulin.  Gavin Pound. Oletta Lamas, MD 02/26/12 [phone]

## 2012-02-26 NOTE — Discharge Instructions (Signed)

## 2012-04-18 ENCOUNTER — Telehealth: Payer: Self-pay | Admitting: Obstetrics and Gynecology

## 2012-04-18 MED ORDER — VALACYCLOVIR HCL 500 MG PO TABS: 500.0000 mg | ORAL_TABLET | Freq: Every day | ORAL | Status: DC

## 2012-04-18 NOTE — Telephone Encounter (Signed)
Tc to pt regarding message. Pt want rx for hsv 2. Sent rx to pt pharmacy. Pt voiced understanding.

## 2012-07-04 ENCOUNTER — Telehealth: Payer: Self-pay | Admitting: Obstetrics and Gynecology

## 2012-07-04 NOTE — Telephone Encounter (Signed)
Spoke with pt rgd msg. Pt stated she was bleeding for 12 days and heavy with clots.made an app with EP on 07/09/2012 @ 11:15 am. To have a evaluation done. Pt's voice understanding

## 2012-07-07 ENCOUNTER — Encounter (HOSPITAL_COMMUNITY): Payer: Self-pay | Admitting: *Deleted

## 2012-07-07 ENCOUNTER — Inpatient Hospital Stay (HOSPITAL_COMMUNITY)
Admission: AD | Admit: 2012-07-07 | Discharge: 2012-07-07 | Disposition: A | Discharge status: Hospice | Payer: BC Managed Care – PPO | Source: Ambulatory Visit | Attending: Obstetrics and Gynecology | Admitting: Obstetrics and Gynecology

## 2012-07-07 DIAGNOSIS — D649 Anemia, unspecified: Secondary | ICD-10-CM | POA: Insufficient documentation

## 2012-07-07 DIAGNOSIS — N920 Excessive and frequent menstruation with regular cycle: Secondary | ICD-10-CM | POA: Insufficient documentation

## 2012-07-07 DIAGNOSIS — E119 Type 2 diabetes mellitus without complications: Secondary | ICD-10-CM | POA: Insufficient documentation

## 2012-07-07 DIAGNOSIS — N390 Urinary tract infection, site not specified: Secondary | ICD-10-CM | POA: Insufficient documentation

## 2012-07-07 DIAGNOSIS — R109 Unspecified abdominal pain: Secondary | ICD-10-CM | POA: Insufficient documentation

## 2012-07-07 HISTORY — DX: Polycystic ovarian syndrome: E28.2

## 2012-07-07 HISTORY — DX: Benign neoplasm of connective and other soft tissue, unspecified: D21.9

## 2012-07-07 LAB — URINALYSIS, ROUTINE W REFLEX MICROSCOPIC
Nitrite: POSITIVE — AB
Specific Gravity, Urine: 1.02 (ref 1.005–1.030)
Urobilinogen, UA: 0.2 mg/dL (ref 0.0–1.0)
pH: 6 (ref 5.0–8.0)

## 2012-07-07 LAB — COMPREHENSIVE METABOLIC PANEL
BUN: 8 mg/dL (ref 6–23)
Calcium: 9.1 mg/dL (ref 8.4–10.5)
GFR calc Af Amer: >90 mL/min (ref >90)
Glucose, Bld: 249 mg/dL — ABNORMAL HIGH (ref 70–99)
Total Protein: 6.9 g/dL (ref 6.0–8.3)

## 2012-07-07 LAB — WET PREP, GENITAL
Clue Cells Wet Prep HPF POC: NONE SEEN
Yeast Wet Prep HPF POC: NONE SEEN

## 2012-07-07 LAB — CBC
Hemoglobin: 9.2 g/dL — ABNORMAL LOW (ref 12.0–15.0)
MCV: 80 fL (ref 78.0–100.0)
Platelets: 452 10*3/uL — ABNORMAL HIGH (ref 150–400)
RBC: 3.55 MIL/uL — ABNORMAL LOW (ref 3.87–5.11)
WBC: 8.1 10*3/uL (ref 4.0–10.5)

## 2012-07-07 LAB — URINE MICROSCOPIC-ADD ON

## 2012-07-07 LAB — POCT PREGNANCY, URINE: Preg Test, Ur: NEGATIVE

## 2012-07-07 NOTE — MAU Provider Note (Signed)
History     CSN: [id number]  Arrival date and time: 07/07/12 2055   First Provider Initiated Contact with Patient 07/07/12 2254      No chief complaint on file.  HPI Comments: Pt is a [id number] that arrives with c/o menorrhagia, most recent cycle about 48 days, now has had bleeding for about 15 days, using tampons and pads, changing about every 4-6 hours. Bleeding was a little lighter today. Pt also c/o abdominal cramping, has not taken any OTC meds. Denies syncope. Denies any UTI sx's. Pt has appt at the office this week for evaluation.      Past Medical History  Diagnosis Date  . Diabetes mellitus   . Arthritis   . Degenerative disc disease   . Degenerative disc disease   . Sickle cell trait   . DDD (degenerative disc disease)   . Anxiety   . Endometriosis   . Scoliosis   . DDD (degenerative disc disease)   . PCOS (polycystic ovarian syndrome)   . Fibroids     Past Surgical History  Procedure Laterality Date  . Back surgery    . Cesarean section    . Laparoscopy  2002 or 2003  . Wisdom tooth extraction    . Toe surgery    . Biopsy breast      Family History  Problem Relation Age of Onset  . Diabetes Paternal Grandfather   . Hypertension Paternal Grandfather   . Hyperlipidemia Paternal Grandfather   . Diabetes Paternal Grandmother   . Hypertension Paternal Grandmother   . Hyperlipidemia Paternal Grandmother   . Alzheimer's disease Maternal Grandmother   . Brain cancer Maternal Grandfather   . Diabetes Father   . Hypertension Father   . Hyperlipidemia Father     History  Substance Use Topics  . Smoking status: Never Smoker   . Smokeless tobacco: Not on file  . Alcohol Use: No     Comment: twice anually    Allergies:  Allergies  Allergen Reactions  . Shellfish Allergy Swelling    Itching of lips and ears  . Sulfa Antibiotics Other (See Comments)    Dizzy and sees spots with sulfa drugs    Prescriptions prior to admission  Medication Sig Dispense  Refill  . insulin detemir (LEVEMIR) 100 UNIT/ML injection Inject 10 Units into the skin at bedtime.       . SitaGLIPtin-MetFORMIN HCl [phone] MG TB24 Take 1 tablet by mouth daily.      . tapentadol (NUCYNTA) 50 MG TABS Take 50 mg by mouth at bedtime as needed. For back pain.      . Tapentadol HCl (NUCYNTA ER) 150 MG TB12 Take 1 tablet by mouth daily.      . valACYclovir (VALTREX) 500 MG tablet Take 1 tablet (500 mg total) by mouth daily.  30 tablet  11    Review of Systems  Gastrointestinal: Positive for abdominal pain.  Neurological: Negative for dizziness.  All other systems reviewed and are negative.   Physical Exam   Blood pressure 119/77, pulse 101, temperature 97 F (36.1 C), temperature source Oral, resp. rate 16, height 5\' 8"  (1.727 Stein), weight 236 lb (107.049 kg), last menstrual period 06/23/2012.  Physical Exam  Nursing note and vitals reviewed. Constitutional: She is oriented to person, place, and time. She appears well-developed and well-nourished.  HENT:  Head: Normocephalic.  Cardiovascular: Normal rate.   Respiratory: Effort normal.  GI: Soft. She exhibits no distension. There is no tenderness. There is no  rebound.  Genitourinary: Vagina normal.  Wet prep and GC/CT obtained Mod amt blood in vault, cervix otherwise normal Bimanual exam normal    Musculoskeletal: Normal range of motion.  Neurological: She is alert and oriented to person, place, and time. She has normal reflexes.  Skin: Skin is warm and dry.  Psychiatric: She has a normal mood and affect. Her behavior is normal.    MAU Course  Procedures CBC, CMP Wet prep GC/CT  Assessment and Plan  42yo w menorrhagia Diabetes Anemia - hemodynamically stable  Possible UTI - will send for culture  D/c home, f/u office this week as scheduled Increase iron-rich foods    Karen Stein,Karen Stein 07/07/2012, 11:03 PM

## 2012-07-09 ENCOUNTER — Encounter: Payer: BC Managed Care – PPO | Admitting: Obstetrics and Gynecology

## 2012-07-10 LAB — URINE CULTURE

## 2012-07-22 ENCOUNTER — Other Ambulatory Visit: Payer: Self-pay | Admitting: Obstetrics and Gynecology

## 2012-07-22 LAB — CBC
Hemoglobin: 10.3 g/dL — ABNORMAL LOW (ref 12.0–15.0)
MCHC: 32.6 g/dL (ref 30.0–36.0)
RBC: 4.11 MIL/uL (ref 3.87–5.11)

## 2012-07-22 LAB — PROLACTIN: Prolactin: 6.7 ng/mL

## 2012-07-22 LAB — TSH: TSH: 0.676 u[IU]/mL (ref 0.350–4.500)

## 2012-08-07 ENCOUNTER — Other Ambulatory Visit: Payer: Self-pay | Admitting: Internal Medicine

## 2012-08-07 DIAGNOSIS — N6320 Unspecified lump in the left breast, unspecified quadrant: Secondary | ICD-10-CM

## 2012-08-13 ENCOUNTER — Ambulatory Visit
Admission: RE | Admit: 2012-08-13 | Discharge: 2012-08-13 | Disposition: A | Discharge status: Home / Self Care | Payer: BC Managed Care – PPO | Source: Ambulatory Visit | Attending: Internal Medicine | Admitting: Internal Medicine

## 2012-08-13 ENCOUNTER — Other Ambulatory Visit: Payer: Self-pay | Admitting: Internal Medicine

## 2012-08-13 DIAGNOSIS — N6320 Unspecified lump in the left breast, unspecified quadrant: Secondary | ICD-10-CM

## 2013-05-21 ENCOUNTER — Emergency Department (HOSPITAL_COMMUNITY)
Admission: EM | Admit: 2013-05-21 | Discharge: 2013-05-22 | Disposition: A | Discharge status: Home / Self Care | Payer: BC Managed Care – PPO | Attending: Emergency Medicine | Admitting: Emergency Medicine

## 2013-05-21 ENCOUNTER — Emergency Department (HOSPITAL_COMMUNITY): Payer: BC Managed Care – PPO

## 2013-05-21 ENCOUNTER — Encounter (HOSPITAL_COMMUNITY): Payer: Self-pay | Admitting: Emergency Medicine

## 2013-05-21 DIAGNOSIS — E119 Type 2 diabetes mellitus without complications: Secondary | ICD-10-CM | POA: Insufficient documentation

## 2013-05-21 DIAGNOSIS — Z79899 Other long term (current) drug therapy: Secondary | ICD-10-CM | POA: Insufficient documentation

## 2013-05-21 DIAGNOSIS — R197 Diarrhea, unspecified: Secondary | ICD-10-CM | POA: Insufficient documentation

## 2013-05-21 DIAGNOSIS — N760 Acute vaginitis: Secondary | ICD-10-CM | POA: Insufficient documentation

## 2013-05-21 DIAGNOSIS — Z8739 Personal history of other diseases of the musculoskeletal system and connective tissue: Secondary | ICD-10-CM | POA: Insufficient documentation

## 2013-05-21 DIAGNOSIS — Z862 Personal history of diseases of the blood and blood-forming organs and certain disorders involving the immune mechanism: Secondary | ICD-10-CM | POA: Insufficient documentation

## 2013-05-21 DIAGNOSIS — K5900 Constipation, unspecified: Secondary | ICD-10-CM

## 2013-05-21 DIAGNOSIS — R109 Unspecified abdominal pain: Secondary | ICD-10-CM

## 2013-05-21 DIAGNOSIS — N898 Other specified noninflammatory disorders of vagina: Secondary | ICD-10-CM | POA: Insufficient documentation

## 2013-05-21 DIAGNOSIS — R1032 Left lower quadrant pain: Secondary | ICD-10-CM | POA: Insufficient documentation

## 2013-05-21 DIAGNOSIS — A499 Bacterial infection, unspecified: Secondary | ICD-10-CM | POA: Insufficient documentation

## 2013-05-21 DIAGNOSIS — Z3202 Encounter for pregnancy test, result negative: Secondary | ICD-10-CM | POA: Insufficient documentation

## 2013-05-21 DIAGNOSIS — B9689 Other specified bacterial agents as the cause of diseases classified elsewhere: Secondary | ICD-10-CM | POA: Insufficient documentation

## 2013-05-21 DIAGNOSIS — Z794 Long term (current) use of insulin: Secondary | ICD-10-CM | POA: Insufficient documentation

## 2013-05-21 DIAGNOSIS — Z8659 Personal history of other mental and behavioral disorders: Secondary | ICD-10-CM | POA: Insufficient documentation

## 2013-05-21 LAB — POCT I-STAT, CHEM 8
BUN: 14 mg/dL (ref 6–23)
Calcium, Ion: 1.2 mmol/L (ref 1.12–1.23)
Chloride: 101 mEq/L (ref 96–112)
Creatinine, Ser: 1 mg/dL (ref 0.50–1.10)
Glucose, Bld: 302 mg/dL — ABNORMAL HIGH (ref 70–99)
HCT: 41 % (ref 36.0–46.0)
Hemoglobin: 13.9 g/dL (ref 12.0–15.0)
Potassium: 4.3 mEq/L (ref 3.7–5.3)
Sodium: 137 mEq/L (ref 137–147)
TCO2: 26 mmol/L (ref 0–100)

## 2013-05-21 LAB — CBC WITH DIFFERENTIAL/PLATELET
Basophils Absolute: 0.1 10*3/uL (ref 0.0–0.1)
Basophils Relative: 1 % (ref 0–1)
Eosinophils Absolute: 0.1 10*3/uL (ref 0.0–0.7)
Eosinophils Relative: 1 % (ref 0–5)
HCT: 37 % (ref 36.0–46.0)
Hemoglobin: 11.9 g/dL — ABNORMAL LOW (ref 12.0–15.0)
Lymphocytes Relative: 28 % (ref 12–46)
Lymphs Abs: 2.9 10*3/uL (ref 0.7–4.0)
MCH: 25 pg — ABNORMAL LOW (ref 26.0–34.0)
MCHC: 32.2 g/dL (ref 30.0–36.0)
MCV: 77.7 fL — ABNORMAL LOW (ref 78.0–100.0)
Monocytes Absolute: 0.5 10*3/uL (ref 0.1–1.0)
Monocytes Relative: 5 % (ref 3–12)
Neutro Abs: 6.8 10*3/uL (ref 1.7–7.7)
Neutrophils Relative %: 65 % (ref 43–77)
Platelets: 477 10*3/uL — ABNORMAL HIGH (ref 150–400)
RBC: 4.76 MIL/uL (ref 3.87–5.11)
RDW: 16.9 % — ABNORMAL HIGH (ref 11.5–15.5)
WBC: 10.3 10*3/uL (ref 4.0–10.5)

## 2013-05-21 LAB — URINALYSIS, ROUTINE W REFLEX MICROSCOPIC
Bilirubin Urine: NEGATIVE
Glucose, UA: >1000 mg/dL — AB
KETONES UR: NEGATIVE mg/dL
NITRITE: NEGATIVE
PH: 5 (ref 5.0–8.0)
Protein, ur: NEGATIVE mg/dL
SPECIFIC GRAVITY, URINE: 1.027 (ref 1.005–1.030)
Urobilinogen, UA: 0.2 mg/dL (ref 0.0–1.0)

## 2013-05-21 LAB — URINE MICROSCOPIC-ADD ON

## 2013-05-21 LAB — GLUCOSE, CAPILLARY: Glucose-Capillary: 313 mg/dL — ABNORMAL HIGH (ref 70–99)

## 2013-05-21 LAB — WET PREP, GENITAL
Trich, Wet Prep: NONE SEEN
WBC, Wet Prep HPF POC: NONE SEEN
Yeast Wet Prep HPF POC: NONE SEEN

## 2013-05-21 LAB — PREGNANCY, URINE: Preg Test, Ur: NEGATIVE

## 2013-05-21 MED ORDER — FENTANYL CITRATE 0.05 MG/ML IJ SOLN
50.0000 ug | Freq: Once | INTRAMUSCULAR | Status: AC
  Administered 2013-05-21: 50 ug via INTRAVENOUS
  Filled 2013-05-21: qty 2

## 2013-05-21 MED ORDER — POLYETHYLENE GLYCOL 3350 17 G PO PACK: 17.0000 g | PACK | Freq: Every day | ORAL | Status: DC

## 2013-05-21 MED ORDER — METRONIDAZOLE 500 MG PO TABS: 500.0000 mg | ORAL_TABLET | Freq: Two times a day (BID) | ORAL | Status: DC

## 2013-05-21 MED ORDER — IOHEXOL 300 MG/ML  SOLN
100.0000 mL | Freq: Once | INTRAMUSCULAR | Status: AC | PRN
  Administered 2013-05-21: 100 mL via INTRAVENOUS

## 2013-05-21 MED ORDER — SODIUM CHLORIDE 0.9 % IV BOLUS (SEPSIS)
500.0000 mL | Freq: Once | INTRAVENOUS | Status: AC
  Administered 2013-05-21: 500 mL via INTRAVENOUS

## 2013-05-21 MED ORDER — IOHEXOL 300 MG/ML  SOLN
50.0000 mL | Freq: Once | INTRAMUSCULAR | Status: AC | PRN
  Administered 2013-05-21: 50 mL via ORAL

## 2013-05-21 NOTE — ED Notes (Signed)
Pt states that she has been having LLQ abd pain since Monday.  States that her cycle just went off and states that during her period, she was passing "funny clots".  States that she "felt up inside herself and with her fingers extended she could feel something "smooth, round, and hard".  Pt informed that she was probably feeling her cervix.

## 2013-05-21 NOTE — ED Provider Notes (Signed)
CSN: [id number]     Arrival date & time 05/21/13  1833 History   First MD Initiated Contact with Patient 05/21/13 1918     Chief Complaint  Patient presents with  . Abdominal Pain   (Consider location/radiation/quality/duration/timing/severity/associated sxs/prior Treatment) Patient is a 43 y.o. female presenting with abdominal pain. The history is provided by the patient.  Abdominal Pain Associated symptoms: vaginal bleeding   Associated symptoms: no chest pain, no diarrhea, no nausea, no shortness of breath, no vaginal discharge and no vomiting    patient presents with lower abdominal pain for last 2 days. It is in her right lower abdomen. It is worse with movement. It is worse with using her legs also. No dysuria. She states she did have some irregular bleeding with her menses. She states she missed a month or 2 of her menses. She states she has had a problem with constipation, however she has had regular bowel movement yesterday. No nausea vomiting. No fevers. She states her temperature was 99.5 at home. Pain is constant. She denies dysuria. She states that her blood sugars have been running somewhat higher, however she has not been checking them. She states that she put her fingers in her vagina and felt something round in there. She states it was more on the right side where the pain was.  Past Medical History  Diagnosis Date  . Diabetes mellitus   . Arthritis   . Degenerative disc disease   . Degenerative disc disease   . Sickle cell trait   . DDD (degenerative disc disease)   . Anxiety   . Endometriosis   . Scoliosis   . DDD (degenerative disc disease)   . PCOS (polycystic ovarian syndrome)   . Fibroids    Past Surgical History  Procedure Laterality Date  . Back surgery    . Cesarean section    . Laparoscopy  2002 or 2003  . Wisdom tooth extraction    . Toe surgery    . Biopsy breast     Family History  Problem Relation Age of Onset  . Diabetes Paternal Grandfather    . Hypertension Paternal Grandfather   . Hyperlipidemia Paternal Grandfather   . Diabetes Paternal Grandmother   . Hypertension Paternal Grandmother   . Hyperlipidemia Paternal Grandmother   . Alzheimer's disease Maternal Grandmother   . Brain cancer Maternal Grandfather   . Diabetes Father   . Hypertension Father   . Hyperlipidemia Father    History  Substance Use Topics  . Smoking status: Never Smoker   . Smokeless tobacco: Not on file  . Alcohol Use: No     Comment: twice anually   OB History   Grav Para Term Preterm Abortions TAB SAB Ect Mult Living   4 2 1 1 2  2   1      Review of Systems  Constitutional: Negative for activity change and appetite change.  Eyes: Negative for pain.  Respiratory: Negative for chest tightness and shortness of breath.   Cardiovascular: Negative for chest pain and leg swelling.  Gastrointestinal: Positive for abdominal pain. Negative for nausea, vomiting and diarrhea.  Genitourinary: Positive for vaginal bleeding. Negative for flank pain and vaginal discharge.  Musculoskeletal: Negative for back pain and neck stiffness.  Skin: Negative for rash.  Neurological: Negative for weakness, numbness and headaches.  Psychiatric/Behavioral: Negative for behavioral problems.   pelvic exam: Mild white vaginal discharge. Mild cervical motion tenderness. No adnexal tenderness or Allergies  Shellfish allergy and Sulfa  antibiotics  Home Medications   Current Outpatient Rx  Name  Route  Sig  Dispense  Refill  . CRESTOR 10 MG tablet   Oral   Take 10 mg by mouth daily.          . cyclobenzaprine (FLEXERIL) 10 MG tablet   Oral   Take 10 mg by mouth 3 (three) times daily as needed for muscle spasms.         . ferrous sulfate 325 (65 FE) MG tablet   Oral   Take 325 mg by mouth daily with breakfast.         . JARDIANCE 25 MG TABS   Oral   Take 25 mg by mouth daily.          Marland Kitchen lidocaine (LIDODERM) 5 %   Transdermal   Place 1 patch onto the  skin as needed (back pain). Remove & Discard patch within 12 hours or as directed by MD         . SitaGLIPtin-MetFORMIN HCl [phone] MG TB24   Oral   Take 1 tablet by mouth every evening.          . Tapentadol HCl (NUCYNTA ER) 150 MG TB12   Oral   Take 150 mg by mouth 2 (two) times daily as needed (chronic pain).         . valACYclovir (VALTREX) 500 MG tablet   Oral   Take 1 tablet (500 mg total) by mouth daily.   30 tablet   11   . WELCHOL 625 MG tablet   Oral   Take 1,250 mg by mouth 2 (two) times daily with a meal.          . LANTUS SOLOSTAR 100 UNIT/ML Solostar Pen   Subcutaneous   Inject 20 Units into the skin daily at 10 pm.          . metroNIDAZOLE (FLAGYL) 500 MG tablet   Oral   Take 1 tablet (500 mg total) by mouth 2 (two) times daily.   14 tablet   0   . polyethylene glycol (MIRALAX) packet   Oral   Take 17 g by mouth daily.   7 each   0    BP 108/65  Pulse 87  Temp(Src) 98.7 F (37.1 C) (Oral)  Resp 14  SpO2 100%  LMP 05/14/2013 Physical Exam  Nursing note and vitals reviewed. Constitutional: She is oriented to person, place, and time. She appears well-developed and well-nourished.  HENT:  Head: Normocephalic and atraumatic.  Eyes: EOM are normal. Pupils are equal, round, and reactive to light.  Neck: Normal range of motion. Neck supple.  Cardiovascular: Normal rate, regular rhythm and normal heart sounds.   No murmur heard. Pulmonary/Chest: Effort normal and breath sounds normal. No respiratory distress. She has no wheezes. She has no rales.  Abdominal: Soft. Bowel sounds are normal. She exhibits no distension. There is tenderness. There is no rebound and no guarding.  Bilateral lower abdominal and suprapubic tenderness without rebound or guarding. No masses.  Musculoskeletal: Normal range of motion.  Neurological: She is alert and oriented to person, place, and time. No cranial nerve deficit.  Skin: Skin is warm and dry.  Psychiatric:  She has a normal mood and affect. Her speech is normal.    ED Course  Procedures (including critical care time) Labs Review Labs Reviewed  WET PREP, GENITAL - Abnormal; Notable for the following:    Clue Cells Wet Prep HPF POC RARE (*)  All other components within normal limits  GLUCOSE, CAPILLARY - Abnormal; Notable for the following:    Glucose-Capillary 313 (*)    All other components within normal limits  CBC WITH DIFFERENTIAL - Abnormal; Notable for the following:    Hemoglobin 11.9 (*)    MCV 77.7 (*)    MCH 25.0 (*)    RDW 16.9 (*)    Platelets 477 (*)    All other components within normal limits  URINALYSIS, ROUTINE W REFLEX MICROSCOPIC - Abnormal; Notable for the following:    APPearance CLOUDY (*)    Glucose, UA >1000 (*)    Hgb urine dipstick SMALL (*)    Leukocytes, UA SMALL (*)    All other components within normal limits  POCT I-STAT, CHEM 8 - Abnormal; Notable for the following:    Glucose, Bld 302 (*)    All other components within normal limits  GC/CHLAMYDIA PROBE AMP  URINE CULTURE  PREGNANCY, URINE  URINE MICROSCOPIC-ADD ON   Imaging Review Ct Abdomen Pelvis W Contrast  05/21/2013   CLINICAL DATA:  Left lower quadrant pain since Monday.  EXAM: CT ABDOMEN AND PELVIS WITH CONTRAST  TECHNIQUE: Multidetector CT imaging of the abdomen and pelvis was performed using the standard protocol following bolus administration of intravenous contrast.  CONTRAST:  95mL OMNIPAQUE IOHEXOL 300 MG/ML SOLN, 174mL OMNIPAQUE IOHEXOL 300 MG/ML SOLN  COMPARISON:  None.  FINDINGS: Lower Chest: Anterior right lung base scarring. Heart size upper normal, without pericardial or pleural effusion.  Abdomen/Pelvis: Normal liver, spleen, stomach, pancreas. Partially contracted gallbladder. Normal biliary tract. Normal adrenal glands and kidneys.  No retroperitoneal or retrocrural adenopathy. Colonic stool burden suggests constipation. Normal terminal ileum and appendix. Solid stool like  material within the distal small bowel. No obstruction or ascites.  No pelvic adenopathy. Uterus eccentric right. Normal urinary bladder. No adnexal mass or significant free fluid.  Bones/Musculoskeletal: Lumbosacral spine fixation. The sacral screws extend anterior to the cortex, without surrounding hematoma. Advanced degenerative disc disease throughout the lumbar spine.  IMPRESSION: 1.  Possible constipation. 2. No other explanation for left lower quadrant pain.   Electronically Signed   By: Abigail Miyamoto M.D.   On: 05/21/2013 22:33    EKG Interpretation   None       MDM   1. Abdominal pain   2. Bacterial vaginosis   3. Constipation    Patient lower abdominal pain. Lab work overall reassuring. CT scan done and shows only constipation. We'll treat for mild bacterial vaginosis and constipation. Will followup with PCP as needed    Jasper Riling. Alvino Chapel, MD 05/21/13 2332

## 2013-05-21 NOTE — Discharge Instructions (Signed)
Abdominal Pain, Adult Many things can cause abdominal pain. Usually, abdominal pain is not caused by a disease and will improve without treatment. It can often be observed and treated at home. Your health care provider will do a physical exam and possibly order blood tests and X-rays to help determine the seriousness of your pain. However, in many cases, more time must pass before a clear cause of the pain can be found. Before that point, your health care provider may not know if you need more testing or further treatment. HOME CARE INSTRUCTIONS  Monitor your abdominal pain for any changes. The following actions may help to alleviate any discomfort you are experiencing:  Only take over-the-counter or prescription medicines as directed by your health care provider.  Do not take laxatives unless directed to do so by your health care provider.  Try a clear liquid diet (broth, tea, or water) as directed by your health care provider. Slowly move to a bland diet as tolerated. SEEK MEDICAL CARE IF:  You have unexplained abdominal pain.  You have abdominal pain associated with nausea or diarrhea.  You have pain when you urinate or have a bowel movement.  You experience abdominal pain that wakes you in the night.  You have abdominal pain that is worsened or improved by eating food.  You have abdominal pain that is worsened with eating fatty foods. SEEK IMMEDIATE MEDICAL CARE IF:   Your pain does not go away within 2 hours.  You have a fever.  You keep throwing up (vomiting).  Your pain is felt only in portions of the abdomen, such as the right side or the left lower portion of the abdomen.  You pass bloody or black tarry stools. MAKE SURE YOU:  Understand these instructions.   Will watch your condition.   Will get help right away if you are not doing well or get worse.  Document Released: 01/25/2005 Document Revised: 02/05/2013 Document Reviewed: 12/25/2012 [facility] Patient  Information 2014 [place].  Constipation, Adult Constipation is when a person has fewer than 3 bowel movements a week; has difficulty having a bowel movement; or has stools that are dry, hard, or larger than normal. As people grow older, constipation is more common. If you try to fix constipation with medicines that make you have a bowel movement (laxatives), the problem may get worse. Long-term laxative use may cause the muscles of the colon to become weak. A low-fiber diet, not taking in enough fluids, and taking certain medicines may make constipation worse. CAUSES   Certain medicines, such as antidepressants, pain medicine, iron supplements, antacids, and water pills.   Certain diseases, such as diabetes, irritable bowel syndrome (IBS), thyroid disease, or depression.   Not drinking enough water.   Not eating enough fiber-rich foods.   Stress or travel.  Lack of physical activity or exercise.  Not going to the restroom when there is the urge to have a bowel movement.  Ignoring the urge to have a bowel movement.  Using laxatives too much. SYMPTOMS   Having fewer than 3 bowel movements a week.   Straining to have a bowel movement.   Having hard, dry, or larger than normal stools.   Feeling full or bloated.   Pain in the lower abdomen.  Not feeling relief after having a bowel movement. DIAGNOSIS  Your caregiver will take a medical history and perform a physical exam. Further testing may be done for severe constipation. Some tests may include:   A barium  enema X-ray to examine your rectum, colon, and sometimes, your small intestine.  A sigmoidoscopy to examine your lower colon.  A colonoscopy to examine your entire colon. TREATMENT  Treatment will depend on the severity of your constipation and what is causing it. Some dietary treatments include drinking more fluids and eating more fiber-rich foods. Lifestyle treatments may include regular exercise. If these  diet and lifestyle recommendations do not help, your caregiver may recommend taking over-the-counter laxative medicines to help you have bowel movements. Prescription medicines may be prescribed if over-the-counter medicines do not work.  HOME CARE INSTRUCTIONS   Increase dietary fiber in your diet, such as fruits, vegetables, whole grains, and beans. Limit high-fat and processed sugars in your diet, such as [place] fries, hamburgers, cookies, candies, and soda.   A fiber supplement may be added to your diet if you cannot get enough fiber from foods.   Drink enough fluids to keep your urine clear or pale yellow.   Exercise regularly or as directed by your caregiver.   Go to the restroom when you have the urge to go. Do not hold it.  Only take medicines as directed by your caregiver. Do not take other medicines for constipation without talking to your caregiver first. [place] IF:   You have bright red blood in your stool.   Your constipation lasts for more than 4 days or gets worse.   You have abdominal or rectal pain.   You have thin, pencil-like stools.  You have unexplained weight loss. MAKE SURE YOU:   Understand these instructions.  Will watch your condition.  Will get help right away if you are not doing well or get worse. Document Released: 01/14/2004 Document Revised: 07/10/2011 Document Reviewed: 01/27/2013 [facility] Patient Information 2014 [place], [place].  Bacterial Vaginosis Bacterial vaginosis is a vaginal infection that occurs when the normal balance of bacteria in the vagina is disrupted. It results from an overgrowth of certain bacteria. This is the most common vaginal infection in women of childbearing age. Treatment is important to prevent complications, especially in pregnant women, as it can cause a premature delivery. CAUSES  Bacterial vaginosis is caused by an increase in harmful bacteria that are normally present in smaller amounts  in the vagina. Several different kinds of bacteria can cause bacterial vaginosis. However, the reason that the condition develops is not fully understood. RISK FACTORS Certain activities or behaviors can put you at an increased risk of developing bacterial vaginosis, including:  Having a new sex partner or multiple sex partners.  Douching.  Using an intrauterine device (IUD) for contraception. Women do not get bacterial vaginosis from toilet seats, bedding, swimming pools, or contact with objects around them. SIGNS AND SYMPTOMS  Some women with bacterial vaginosis have no signs or symptoms. Common symptoms include:  Grey vaginal discharge.  A fishlike odor with discharge, especially after sexual intercourse.  Itching or burning of the vagina and vulva.  Burning or pain with urination. DIAGNOSIS  Your health care provider will take a medical history and examine the vagina for signs of bacterial vaginosis. A sample of vaginal fluid may be taken. Your health care provider will look at this sample under a microscope to check for bacteria and abnormal cells. A vaginal pH test may also be done.  TREATMENT  Bacterial vaginosis may be treated with antibiotic medicines. These may be given in the form of a pill or a vaginal cream. A second round of antibiotics may be prescribed if  the condition comes back after treatment.  HOME CARE INSTRUCTIONS   Only take over-the-counter or prescription medicines as directed by your health care provider.  If antibiotic medicine was prescribed, take it as directed. Make sure you finish it even if you start to feel better.  Do not have sex until treatment is completed.  Tell all sexual partners that you have a vaginal infection. They should see their health care provider and be treated if they have problems, such as a mild rash or itching.  Practice safe sex by using condoms and only having one sex partner. SEEK MEDICAL CARE IF:   Your symptoms are not  improving after 3 days of treatment.  You have increased discharge or pain.  You have a fever. MAKE SURE YOU:   Understand these instructions.  Will watch your condition.  Will get help right away if you are not doing well or get worse. FOR MORE INFORMATION  Centers for Disease Control and Prevention, Division of STD Prevention: Appraiser[employer]raud.fi American Sexual Health Association (ASHA): www.ashastd.org  Document Released: 04/17/2005 Document Revised: 02/05/2013 Document Reviewed: 11/27/2012 [facility] Patient Information 2014 [place].

## 2013-05-22 LAB — GC/CHLAMYDIA PROBE AMP
CT Probe RNA: NEGATIVE
GC PROBE AMP APTIMA: NEGATIVE

## 2013-05-22 NOTE — ED Notes (Signed)
Waited x4 hours after Fentanyl for pt to be able to drive herself home at this time

## 2013-05-24 LAB — URINE CULTURE: Colony Count: >=100000

## 2013-05-25 ENCOUNTER — Telehealth (HOSPITAL_COMMUNITY): Payer: Self-pay | Admitting: Emergency Medicine

## 2013-05-25 NOTE — ED Notes (Signed)
Post ED Visit - Positive Culture Follow-up  Culture report reviewed by antimicrobial stewardship pharmacist: []  Wes [place], Pharm.D., BCPS [x]  Heide Guile, Pharm.D., BCPS []  Alycia Rossetti, Pharm.D., BCPS []  [place], Pharm.D., BCPS, AAHIVP []  Legrand Como, Pharm.D., BCPS, AAHIVP  Positive urine culture Per [facility] PA-C, no treatment needed and no further patient follow-up is required at this time.  Myrna Blazer 05/25/2013, 11:39 AM

## 2013-09-18 ENCOUNTER — Encounter (HOSPITAL_COMMUNITY): Payer: Self-pay | Admitting: Emergency Medicine

## 2013-09-18 ENCOUNTER — Emergency Department (INDEPENDENT_AMBULATORY_CARE_PROVIDER_SITE_OTHER)
Admission: EM | Admit: 2013-09-18 | Discharge: 2013-09-18 | Disposition: A | Discharge status: Home / Self Care | Payer: 59 | Source: Home / Self Care | Attending: Emergency Medicine | Admitting: Emergency Medicine

## 2013-09-18 DIAGNOSIS — S39012A Strain of muscle, fascia and tendon of lower back, initial encounter: Secondary | ICD-10-CM

## 2013-09-18 DIAGNOSIS — N390 Urinary tract infection, site not specified: Secondary | ICD-10-CM

## 2013-09-18 DIAGNOSIS — S335XXA Sprain of ligaments of lumbar spine, initial encounter: Secondary | ICD-10-CM

## 2013-09-18 HISTORY — DX: Pure hypercholesterolemia, unspecified: E78.00

## 2013-09-18 LAB — POCT URINALYSIS DIP (DEVICE)
Bilirubin Urine: NEGATIVE
Glucose, UA: >=1000 mg/dL — AB
Ketones, ur: NEGATIVE mg/dL
LEUKOCYTES UA: NEGATIVE
NITRITE: POSITIVE — AB
PROTEIN: NEGATIVE mg/dL
Specific Gravity, Urine: <=1.005 (ref 1.005–1.030)
UROBILINOGEN UA: 0.2 mg/dL (ref 0.0–1.0)
pH: 5.5 (ref 5.0–8.0)

## 2013-09-18 MED ORDER — DICLOFENAC SODIUM 75 MG PO TBEC: 75.0000 mg | DELAYED_RELEASE_TABLET | Freq: Two times a day (BID) | ORAL | Status: DC

## 2013-09-18 MED ORDER — CEPHALEXIN 500 MG PO CAPS: 500.0000 mg | ORAL_CAPSULE | Freq: Three times a day (TID) | ORAL | Status: DC

## 2013-09-18 MED ORDER — HYDROCODONE-ACETAMINOPHEN 5-325 MG PO TABS
2.0000 | ORAL_TABLET | Freq: Once | ORAL | Status: AC
  Administered 2013-09-18: 2 via ORAL

## 2013-09-18 MED ORDER — METHYLPREDNISOLONE ACETATE 80 MG/ML IJ SUSP
80.0000 mg | Freq: Once | INTRAMUSCULAR | Status: AC
  Administered 2013-09-18: 80 mg via INTRAMUSCULAR

## 2013-09-18 MED ORDER — METHYLPREDNISOLONE ACETATE 80 MG/ML IJ SUSP
INTRAMUSCULAR | Status: AC
  Filled 2013-09-18: qty 1

## 2013-09-18 MED ORDER — HYDROCODONE-ACETAMINOPHEN 5-325 MG PO TABS: ORAL_TABLET | ORAL | Status: DC

## 2013-09-18 MED ORDER — HYDROCODONE-ACETAMINOPHEN 5-325 MG PO TABS
ORAL_TABLET | ORAL | Status: AC
  Filled 2013-09-18: qty 2

## 2013-09-18 MED ORDER — KETOROLAC TROMETHAMINE 60 MG/2ML IM SOLN
60.0000 mg | Freq: Once | INTRAMUSCULAR | Status: AC
  Administered 2013-09-18: 60 mg via INTRAMUSCULAR

## 2013-09-18 MED ORDER — KETOROLAC TROMETHAMINE 60 MG/2ML IM SOLN
INTRAMUSCULAR | Status: AC
  Filled 2013-09-18: qty 2

## 2013-09-18 MED ORDER — CYCLOBENZAPRINE HCL 5 MG PO TABS: 5.0000 mg | ORAL_TABLET | Freq: Three times a day (TID) | ORAL | Status: DC | PRN

## 2013-09-18 NOTE — ED Notes (Signed)
Hx. Scoliosis and DDD. C/o sharp burning pain in low back while walking on treadmill at work yesterday.  She put a heating pad on it when she got home and Tylenol PM without relief.

## 2013-09-18 NOTE — ED Provider Notes (Signed)
Chief Complaint   Chief Complaint  Patient presents with  . Back Pain    History of Present Illness   Karen Stein is a 43 year old female with a long-standing history of degenerative disc disease. She had a 2 level spinal fusion done in 2010 by Dr. Luiz Ochoa. She's had ongoing back pain since that. The patient was exercising on a treadmill at work yesterday. She states she was not going particularly fast or an incline but experienced sudden onset of burning discomfort in the left shoulder blade area and the left flank. This was a 10 over 10 in intensity and worse with movement. She denies any radiation down the arm or down the leg. There is no pain with inspiration. No shortness of breath, cough, or hemoptysis. She denies any abdominal pain, fever, chills, weight loss, or history of cancer and there is no radiation into the legs. No numbness, tingling, or weakness. She denies any bladder or bowel dysfunction or saddle anesthesia. It hurts to bend or with any type of movement.  Review of Systems   Other than as noted above, the patient denies any of the following symptoms: Systemic:  No fever, chills, or unexplained weight loss. GI:  No abdominal painor incontinence of bowel. GU:  No dysuria, frequency, urgency, or hematuria. No incontinence of urine or urinary retention.  M-S:  No neck pain or arthritis. Neuro:  No paresthesias, headache, saddle anesthesia, muscular weakness, or progressive neurological deficit.  [place]   Past medical history, family history, social history, meds, and allergies were reviewed. Specifically, there is no history of cancer, major trauma, osteoporosis, immunosuppression, or HIV infection. She is allergic to sulfa and shellfish. She has diabetes and hypercholesterolemia. Current meds include Invokana, Levemir insulin, Victoza, Livalo, Valtrex, WelChol, Crestor, ferrous sulfate, Jardiance, Lantus, sitagliptin/metformin, Nucenta, MiraLax, and Flagyl.  Physical  Examination    Vital signs:  BP 121/75  Pulse 90  Temp(Src) 99.3 F (37.4 C) (Oral)  Resp 18  SpO2 98%  LMP 09/06/2013 General:  Alert, oriented, appears to be in distress, she is laying over the table. Abdomen:  Soft, non-tender.  No organomegaly or mass.  No pulsatile midline abdominal mass or bruit. Back: There is no tenderness to palpation over the left shoulder, left shoulder blade, the upper back. There is moderate tenderness to palpation over the left lower back area. The lower back has a very limited range of motion with 10 of forward flexion, 5 of extension, 10 of lateral bending, and 15 of rotation with pain. Straight leg raising was positive on the left side, negative on the right  Neuro:  Normal muscle strength, sensations and DTRs. Extremities: Pedal pulses were full, there was no edema. Skin:  Clear, warm and dry.  No rash.  Labs   Results for orders placed during the hospital encounter of 09/18/13  POCT URINALYSIS DIP (DEVICE)      Result Value Ref Range   Glucose, UA >=1000 (*) NEGATIVE mg/dL   Bilirubin Urine NEGATIVE  NEGATIVE   Ketones, ur NEGATIVE  NEGATIVE mg/dL   Specific Gravity, Urine <=1.005  1.005 - 1.030   Hgb urine dipstick SMALL (*) NEGATIVE   pH 5.5  5.0 - 8.0   Protein, ur NEGATIVE  NEGATIVE mg/dL   Urobilinogen, UA 0.2  0.0 - 1.0 mg/dL   Nitrite POSITIVE (*) NEGATIVE   Leukocytes, UA NEGATIVE  NEGATIVE    The urine was cultured.  Assessment   The primary encounter diagnosis was Lumbar strain. A diagnosis  of UTI (lower urinary tract infection) was also pertinent to this visit.  Suggested that she followup with her neurosurgeon within the next week. No evidence of cauda equina syndrome.  Plan     1.  Meds:  The following meds were prescribed:   Discharge Medication List as of 09/18/2013  7:36 PM    START taking these medications   Details  cephALEXin (KEFLEX) 500 MG capsule Take 1 capsule (500 mg total) by mouth 3 (three) times daily.,  Starting 09/18/2013, Until Discontinued, Normal    !! cyclobenzaprine (FLEXERIL) 5 MG tablet Take 1 tablet (5 mg total) by mouth 3 (three) times daily as needed for muscle spasms., Starting 09/18/2013, Until Discontinued, Normal    diclofenac (VOLTAREN) 75 MG EC tablet Take 1 tablet (75 mg total) by mouth 2 (two) times daily., Starting 09/18/2013, Until Discontinued, Normal    HYDROcodone-acetaminophen (NORCO/VICODIN) 5-325 MG per tablet 1 to 2 tabs every 4 to 6 hours as needed for pain., Print     !! - Potential duplicate medications found. Please discuss with provider.      2.  Patient Education/Counseling:  The patient was given appropriate handouts, self care instructions, and instructed in symptomatic relief. The patient was encouraged to try to be as active as possible and given some exercises to do followed by moist heat.  3.  Follow up:  The patient was told to follow up here if no better in 3 to 4 days, or sooner if becoming worse in any way, and given some red flag symptoms such as worsening pain or new neurological symptoms which would prompt immediate return.  Follow up with Dr. Luiz Ochoa next week.     Harden Mo, MD 09/18/13 2141

## 2013-09-18 NOTE — Discharge Instructions (Signed)
Do exercises twice daily followed by moist heat for 15 minutes. ° ° ° ° ° °Try to be as active as possible. ° °If no better in 2 weeks, follow up with orthopedist. ° ° °

## 2013-09-20 LAB — URINE CULTURE

## 2013-09-21 NOTE — ED Notes (Signed)
Urine culture: >100,000 colonies E.Coli.  Pt. adequately treated with Keflex. Hanley Seamen [facility] 09/21/2013

## 2013-09-21 NOTE — Progress Notes (Signed)
Quick Note:  Results are abnormal as noted, but have been adequately treated. No further action necessary. ______ 

## 2013-12-13 ENCOUNTER — Encounter (HOSPITAL_COMMUNITY): Payer: Self-pay | Admitting: Emergency Medicine

## 2013-12-13 ENCOUNTER — Emergency Department (HOSPITAL_COMMUNITY): Payer: 59

## 2013-12-13 ENCOUNTER — Emergency Department (HOSPITAL_COMMUNITY)
Admission: EM | Admit: 2013-12-13 | Discharge: 2013-12-13 | Disposition: A | Discharge status: Home / Self Care | Payer: 59 | Attending: Emergency Medicine | Admitting: Emergency Medicine

## 2013-12-13 DIAGNOSIS — S99919A Unspecified injury of unspecified ankle, initial encounter: Principal | ICD-10-CM

## 2013-12-13 DIAGNOSIS — R296 Repeated falls: Secondary | ICD-10-CM | POA: Diagnosis not present

## 2013-12-13 DIAGNOSIS — Y9389 Activity, other specified: Secondary | ICD-10-CM | POA: Insufficient documentation

## 2013-12-13 DIAGNOSIS — S93409A Sprain of unspecified ligament of unspecified ankle, initial encounter: Secondary | ICD-10-CM | POA: Insufficient documentation

## 2013-12-13 DIAGNOSIS — S8990XA Unspecified injury of unspecified lower leg, initial encounter: Secondary | ICD-10-CM | POA: Diagnosis not present

## 2013-12-13 DIAGNOSIS — S8992XA Unspecified injury of left lower leg, initial encounter: Secondary | ICD-10-CM

## 2013-12-13 DIAGNOSIS — E119 Type 2 diabetes mellitus without complications: Secondary | ICD-10-CM | POA: Insufficient documentation

## 2013-12-13 DIAGNOSIS — Y92009 Unspecified place in unspecified non-institutional (private) residence as the place of occurrence of the external cause: Secondary | ICD-10-CM | POA: Insufficient documentation

## 2013-12-13 DIAGNOSIS — IMO0002 Reserved for concepts with insufficient information to code with codable children: Secondary | ICD-10-CM | POA: Diagnosis not present

## 2013-12-13 DIAGNOSIS — Z791 Long term (current) use of non-steroidal anti-inflammatories (NSAID): Secondary | ICD-10-CM | POA: Insufficient documentation

## 2013-12-13 DIAGNOSIS — W19XXXA Unspecified fall, initial encounter: Secondary | ICD-10-CM

## 2013-12-13 DIAGNOSIS — Z87448 Personal history of other diseases of urinary system: Secondary | ICD-10-CM | POA: Insufficient documentation

## 2013-12-13 DIAGNOSIS — S8991XA Unspecified injury of right lower leg, initial encounter: Secondary | ICD-10-CM

## 2013-12-13 DIAGNOSIS — E7800 Pure hypercholesterolemia, unspecified: Secondary | ICD-10-CM | POA: Insufficient documentation

## 2013-12-13 DIAGNOSIS — Z794 Long term (current) use of insulin: Secondary | ICD-10-CM | POA: Diagnosis not present

## 2013-12-13 DIAGNOSIS — S99929A Unspecified injury of unspecified foot, initial encounter: Secondary | ICD-10-CM | POA: Diagnosis not present

## 2013-12-13 DIAGNOSIS — M129 Arthropathy, unspecified: Secondary | ICD-10-CM | POA: Insufficient documentation

## 2013-12-13 DIAGNOSIS — Z792 Long term (current) use of antibiotics: Secondary | ICD-10-CM | POA: Diagnosis not present

## 2013-12-13 DIAGNOSIS — S93402A Sprain of unspecified ligament of left ankle, initial encounter: Secondary | ICD-10-CM

## 2013-12-13 DIAGNOSIS — Z9889 Other specified postprocedural states: Secondary | ICD-10-CM | POA: Insufficient documentation

## 2013-12-13 MED ORDER — HYDROCODONE-ACETAMINOPHEN 5-325 MG PO TABS
2.0000 | ORAL_TABLET | Freq: Once | ORAL | Status: AC
  Administered 2013-12-13: 2 via ORAL
  Filled 2013-12-13: qty 2

## 2013-12-13 MED ORDER — HYDROCODONE-ACETAMINOPHEN 5-325 MG PO TABS: 1.0000 | ORAL_TABLET | ORAL | Status: DC | PRN

## 2013-12-13 NOTE — ED Provider Notes (Signed)
Medical screening examination/treatment/procedure(s) were performed by non-physician practitioner and as supervising physician I was immediately available for consultation/collaboration.   EKG Interpretation None       Orlie Dakin, MD 12/13/13 2154

## 2013-12-13 NOTE — ED Provider Notes (Signed)
CSN: [id number]     Arrival date & time 12/13/13  1544 History  This chart was scribed for non-physician practitioner, Alvina Chou, PA-C, working with Orlie Dakin, MD, by Delphia Grates, ED Scribe. This patient was seen in room WTR6/WTR6 and the patient's care was started at 5:21 PM.    Chief Complaint  Patient presents with  . Fall     Patient is a 43 y.o. female presenting with fall. The history is provided by the patient. No language interpreter was used.  Fall This is a new problem. The problem has not changed since onset.Pertinent negatives include no chest pain, no abdominal pain, no headaches and no shortness of breath. The symptoms are aggravated by walking and standing. She has tried nothing for the symptoms.    HPI Comments: Karen Stein is a 43 y.o. female who presents to the Emergency Department for fall that occurred PTA. Patient states she was doing yard work when she fell forward off her deck, landing on her bilateral knees. Patient suspects she fractured her left ankle. She denies hitting her head or LOC. Patient has history of back surgery and suspects the fall "aggrevated her back". There is associated bilateral knee pain, left ankle pain, and back pain. Patient states the pain is worsen when standing and ambulating and states her right knee "feels like it is going to pop". Patient has history of DM, arthritis, degenerative disc disease, anxiety, endometriosis, scoliosis, PCOS, fibroids, and high cholesterol.    Past Medical History  Diagnosis Date  . Diabetes mellitus   . Arthritis   . Degenerative disc disease   . Degenerative disc disease   . Sickle cell trait   . DDD (degenerative disc disease)   . Anxiety   . Endometriosis   . Scoliosis   . DDD (degenerative disc disease)   . PCOS (polycystic ovarian syndrome)   . Fibroids   . High cholesterol    Past Surgical History  Procedure Laterality Date  . Cesarean section    . Laparoscopy  2002  or 2003  . Wisdom tooth extraction    . Toe surgery  2008  . Biopsy breast    . Back surgery  2010   Family History  Problem Relation Age of Onset  . Diabetes Paternal Grandfather   . Hypertension Paternal Grandfather   . Hyperlipidemia Paternal Grandfather   . Diabetes Paternal Grandmother   . Hypertension Paternal Grandmother   . Hyperlipidemia Paternal Grandmother   . Alzheimer's disease Maternal Grandmother   . Brain cancer Maternal Grandfather   . Diabetes Father   . Hypertension Father   . Hyperlipidemia Father   . Kidney failure Father    History  Substance Use Topics  . Smoking status: Never Smoker   . Smokeless tobacco: Not on file  . Alcohol Use: No     Comment: twice anually   OB History   Grav Para Term Preterm Abortions TAB SAB Ect Mult Living   4 2 1 1 2  2   1      Review of Systems  Respiratory: Negative for shortness of breath.   Cardiovascular: Negative for chest pain.  Gastrointestinal: Negative for abdominal pain.  Musculoskeletal: Positive for arthralgias (bilateral knees, left ankle) and back pain.  Neurological: Negative for headaches.  All other systems reviewed and are negative.     Allergies  Shellfish allergy and Sulfa antibiotics  Home Medications   Prior to Admission medications   Medication Sig Start Date End  Date Taking? Authorizing Provider  Canagliflozin (INVOKANA) 300 MG TABS Take by mouth.    Historical Provider, MD  cephALEXin (KEFLEX) 500 MG capsule Take 1 capsule (500 mg total) by mouth 3 (three) times daily. 09/18/13   Harden Mo, MD  CRESTOR 10 MG tablet Take 10 mg by mouth daily.  05/19/13   Historical Provider, MD  cyclobenzaprine (FLEXERIL) 10 MG tablet Take 10 mg by mouth 3 (three) times daily as needed for muscle spasms.    Historical Provider, MD  cyclobenzaprine (FLEXERIL) 5 MG tablet Take 1 tablet (5 mg total) by mouth 3 (three) times daily as needed for muscle spasms. 09/18/13   Harden Mo, MD  diclofenac  (VOLTAREN) 75 MG EC tablet Take 1 tablet (75 mg total) by mouth 2 (two) times daily. 09/18/13   Harden Mo, MD  ferrous sulfate 325 (65 FE) MG tablet Take 325 mg by mouth daily with breakfast.    Historical Provider, MD  HYDROcodone-acetaminophen (NORCO/VICODIN) 5-325 MG per tablet 1 to 2 tabs every 4 to 6 hours as needed for pain. 09/18/13   Harden Mo, MD  insulin detemir (LEVEMIR) 100 UNIT/ML injection Inject 30 Units into the skin at bedtime.    Historical Provider, MD  JARDIANCE 25 MG TABS Take 25 mg by mouth daily.  05/19/13   Historical Provider, MD  LANTUS SOLOSTAR 100 UNIT/ML Solostar Pen Inject 20 Units into the skin daily at 10 pm.  05/19/13   Historical Provider, MD  lidocaine (LIDODERM) 5 % Place 1 patch onto the skin as needed (back pain). Remove & Discard patch within 12 hours or as directed by MD    Historical Provider, MD  Liraglutide (VICTOZA) 18 MG/3ML SOPN Inject 18 mg into the skin.    Historical Provider, MD  metroNIDAZOLE (FLAGYL) 500 MG tablet Take 1 tablet (500 mg total) by mouth 2 (two) times daily. 05/21/13   Jasper Riling. Pickering, MD  Pitavastatin Calcium (LIVALO) 2 MG TABS Take by mouth every morning.    Historical Provider, MD  polyethylene glycol (MIRALAX) packet Take 17 g by mouth daily. 05/21/13   Jasper Riling. Pickering, MD  SitaGLIPtin-MetFORMIN HCl [phone] MG TB24 Take 1 tablet by mouth every evening.     Historical Provider, MD  Tapentadol HCl (NUCYNTA ER) 150 MG TB12 Take 150 mg by mouth 2 (two) times daily as needed (chronic pain).    Historical Provider, MD  valACYclovir (VALTREX) 500 MG tablet Take 1 tablet (500 mg total) by mouth daily. 04/18/12   Naima A Dillard, MD  WELCHOL 625 MG tablet Take 1,250 mg by mouth 2 (two) times daily with a meal.  05/19/13   Historical Provider, MD   Triage Vitals: BP 127/73  Pulse 109  Temp(Src) 98.4 F (36.9 C) (Oral)  Resp 16  SpO2 100%  LMP 11/29/2013  Physical Exam  Nursing note and vitals reviewed. Constitutional:  She is oriented to person, place, and time. She appears well-developed and well-nourished. No distress.  HENT:  Head: Normocephalic and atraumatic.  Eyes: Conjunctivae and EOM are normal.  Neck: Neck supple. No tracheal deviation present.  Cardiovascular: Normal rate.   Pulmonary/Chest: Effort normal. No respiratory distress.  Musculoskeletal: Normal range of motion. She exhibits edema and tenderness.  Bilateral anterior knee TTP without edema or obvious defrormity. Left malleolar edama with TTP. Sightly limited ROM of left ankle due to pain.  Neurological: She is alert and oriented to person, place, and time.  Skin: Skin is warm and  dry.  Psychiatric: She has a normal mood and affect. Her behavior is normal.    ED Course  Procedures (including critical care time)  DIAGNOSTIC STUDIES: Oxygen Saturation is 100% on room air, normal by my interpretation.    COORDINATION OF CARE: At 1728 Discussed treatment plan with patient which includes ASO ankle brace. Patient agrees.   SPLINT APPLICATION Date/Time: 8:09 PM Authorized by: Alvina Chou Consent: Verbal consent obtained. Risks and benefits: risks, benefits and alternatives were discussed Consent given by: patient Splint applied by: orthopedic technician Location details: left ankle Splint type: ASO brace Supplies used: ASO brace Post-procedure: The splinted body part was neurovascularly unchanged following the procedure. Patient tolerance: Patient tolerated the procedure well with no immediate complications.     Labs Review Labs Reviewed - No data to display  Imaging Review Dg Ankle Complete Left  12/13/2013   CLINICAL DATA:  Left ankle pain.  EXAM: LEFT ANKLE COMPLETE - 3+ VIEW  COMPARISON:  None.  FINDINGS: Imaged bones, joints and soft tissues appear normal.  IMPRESSION: Negative exam.   Electronically Signed   By: Inge Rise M.D.   On: 12/13/2013 17:12   Dg Knee Complete 4 Views Left  12/13/2013   CLINICAL  DATA:  Knee pain, fall  EXAM: LEFT KNEE - COMPLETE 4+ VIEW  COMPARISON:  None.  FINDINGS: No fracture of the proximal tibia or distal femur. Patella is normal. No joint effusion.  IMPRESSION: No acute osseous abnormality.   Electronically Signed   By: Suzy Bouchard M.D.   On: 12/13/2013 17:12   Dg Knee Complete 4 Views Right  12/13/2013   CLINICAL DATA:  Left knee buckled  EXAM: RIGHT KNEE - COMPLETE 4+ VIEW  COMPARISON:  None  FINDINGS: No fracture of the proximal tibia or distal femur. Patella is normal. No joint effusion.  IMPRESSION: No acute osseous abnormality.   Electronically Signed   By: Suzy Bouchard M.D.   On: 12/13/2013 17:16     EKG Interpretation None      MDM   Final diagnoses:  Fall, initial encounter  Left ankle sprain, initial encounter  Knee injury, left, initial encounter  Knee injury, right, initial encounter    Patient's xrays unremarkable for acute changes. Patient will have Vicodin for pain. ASO splint applied for ankle sprain.   I personally performed the services described in this documentation, which was scribed in my presence. The recorded information has been reviewed and is accurate.    Alvina Chou, [place] 12/13/13 1906

## 2013-12-13 NOTE — Discharge Instructions (Signed)
Rest, ice and elevate injuries. Take Vicodin as needed for pain. Follow up with Dr Sharol Given for further evaluation as needed.

## 2013-12-13 NOTE — ED Notes (Signed)
She states she fell in her yard today.  She c/o bilat. Knee pain, plus left ankle pain.  She is in no distress and walks with great difficulty.

## 2014-01-06 ENCOUNTER — Emergency Department (HOSPITAL_COMMUNITY): Payer: BC Managed Care – PPO

## 2014-01-06 ENCOUNTER — Encounter (HOSPITAL_COMMUNITY): Payer: Self-pay | Admitting: Emergency Medicine

## 2014-01-06 ENCOUNTER — Emergency Department (HOSPITAL_COMMUNITY)
Admission: EM | Admit: 2014-01-06 | Discharge: 2014-01-06 | Disposition: A | Discharge status: Home / Self Care | Payer: BC Managed Care – PPO | Attending: Emergency Medicine | Admitting: Emergency Medicine

## 2014-01-06 DIAGNOSIS — Z8742 Personal history of other diseases of the female genital tract: Secondary | ICD-10-CM | POA: Insufficient documentation

## 2014-01-06 DIAGNOSIS — Z8659 Personal history of other mental and behavioral disorders: Secondary | ICD-10-CM | POA: Insufficient documentation

## 2014-01-06 DIAGNOSIS — R079 Chest pain, unspecified: Secondary | ICD-10-CM | POA: Diagnosis present

## 2014-01-06 DIAGNOSIS — Z794 Long term (current) use of insulin: Secondary | ICD-10-CM | POA: Diagnosis not present

## 2014-01-06 DIAGNOSIS — Z3202 Encounter for pregnancy test, result negative: Secondary | ICD-10-CM | POA: Insufficient documentation

## 2014-01-06 DIAGNOSIS — R1012 Left upper quadrant pain: Secondary | ICD-10-CM | POA: Insufficient documentation

## 2014-01-06 DIAGNOSIS — Z79899 Other long term (current) drug therapy: Secondary | ICD-10-CM | POA: Diagnosis not present

## 2014-01-06 DIAGNOSIS — J039 Acute tonsillitis, unspecified: Secondary | ICD-10-CM | POA: Diagnosis not present

## 2014-01-06 DIAGNOSIS — Z9889 Other specified postprocedural states: Secondary | ICD-10-CM | POA: Diagnosis not present

## 2014-01-06 DIAGNOSIS — Z862 Personal history of diseases of the blood and blood-forming organs and certain disorders involving the immune mechanism: Secondary | ICD-10-CM | POA: Insufficient documentation

## 2014-01-06 DIAGNOSIS — R1011 Right upper quadrant pain: Secondary | ICD-10-CM | POA: Insufficient documentation

## 2014-01-06 DIAGNOSIS — E7800 Pure hypercholesterolemia, unspecified: Secondary | ICD-10-CM | POA: Insufficient documentation

## 2014-01-06 DIAGNOSIS — R1013 Epigastric pain: Secondary | ICD-10-CM | POA: Diagnosis not present

## 2014-01-06 DIAGNOSIS — E119 Type 2 diabetes mellitus without complications: Secondary | ICD-10-CM | POA: Diagnosis not present

## 2014-01-06 DIAGNOSIS — M129 Arthropathy, unspecified: Secondary | ICD-10-CM | POA: Insufficient documentation

## 2014-01-06 LAB — I-STAT TROPONIN, ED: Troponin i, poc: 0 ng/mL (ref 0.00–0.08)

## 2014-01-06 LAB — CBC
HCT: 38.8 % (ref 36.0–46.0)
HEMOGLOBIN: 12 g/dL (ref 12.0–15.0)
MCH: 23.8 pg — ABNORMAL LOW (ref 26.0–34.0)
MCHC: 30.9 g/dL (ref 30.0–36.0)
MCV: 76.8 fL — AB (ref 78.0–100.0)
Platelets: 498 10*3/uL — ABNORMAL HIGH (ref 150–400)
RBC: 5.05 MIL/uL (ref 3.87–5.11)
RDW: 14.8 % (ref 11.5–15.5)
WBC: 7.1 10*3/uL (ref 4.0–10.5)

## 2014-01-06 LAB — BASIC METABOLIC PANEL
Anion gap: 12 (ref 5–15)
BUN: 10 mg/dL (ref 6–23)
CO2: 24 meq/L (ref 19–32)
CREATININE: 0.94 mg/dL (ref 0.50–1.10)
Calcium: 9.1 mg/dL (ref 8.4–10.5)
Chloride: 103 mEq/L (ref 96–112)
GFR calc Af Amer: 84 mL/min — ABNORMAL LOW (ref >90)
GFR calc non Af Amer: 73 mL/min — ABNORMAL LOW (ref >90)
GLUCOSE: 129 mg/dL — AB (ref 70–99)
Potassium: 3.9 mEq/L (ref 3.7–5.3)
Sodium: 139 mEq/L (ref 137–147)

## 2014-01-06 LAB — HEPATIC FUNCTION PANEL
ALT: 10 U/L (ref 0–35)
AST: 13 U/L (ref 0–37)
Albumin: 3.5 g/dL (ref 3.5–5.2)
Alkaline Phosphatase: 92 U/L (ref 39–117)
Bilirubin, Direct: <0.2 mg/dL (ref 0.0–0.3)
TOTAL PROTEIN: 8.4 g/dL — AB (ref 6.0–8.3)
Total Bilirubin: 0.4 mg/dL (ref 0.3–1.2)

## 2014-01-06 LAB — LIPASE, BLOOD: LIPASE: 145 U/L — AB (ref 11–59)

## 2014-01-06 LAB — RAPID STREP SCREEN (MED CTR MEBANE ONLY): Streptococcus, Group A Screen (Direct): NEGATIVE

## 2014-01-06 LAB — POC URINE PREG, ED: Preg Test, Ur: NEGATIVE

## 2014-01-06 MED ORDER — AMOXICILLIN 500 MG PO CAPS: 500.0000 mg | ORAL_CAPSULE | Freq: Three times a day (TID) | ORAL | Status: DC

## 2014-01-06 MED ORDER — HYDROCODONE-ACETAMINOPHEN 5-325 MG PO TABS: 2.0000 | ORAL_TABLET | ORAL | Status: DC | PRN

## 2014-01-06 NOTE — Discharge Instructions (Signed)
Abdominal Pain, Women °Abdominal (stomach, pelvic, or belly) pain can be caused by many things. It is important to tell your doctor: °· The location of the pain. °· Does it come and go or is it present all the time? °· Are there things that start the pain (eating certain foods, exercise)? °· Are there other symptoms associated with the pain (fever, nausea, vomiting, diarrhea)? °All of this is helpful to know when trying to find the cause of the pain. °CAUSES  °· Stomach: virus or bacteria infection, or ulcer. °· Intestine: appendicitis (inflamed appendix), regional ileitis (Crohn's disease), ulcerative colitis (inflamed colon), irritable bowel syndrome, diverticulitis (inflamed diverticulum of the colon), or cancer of the stomach or intestine. °· Gallbladder disease or stones in the gallbladder. °· Kidney disease, kidney stones, or infection. °· Pancreas infection or cancer. °· Fibromyalgia (pain disorder). °· Diseases of the female organs: °¨ Uterus: fibroid (non-cancerous) tumors or infection. °¨ Fallopian tubes: infection or tubal pregnancy. °¨ Ovary: cysts or tumors. °¨ Pelvic adhesions (scar tissue). °¨ Endometriosis (uterus lining tissue growing in the pelvis and on the pelvic organs). °¨ Pelvic congestion syndrome (female organs filling up with blood just before the menstrual period). °¨ Pain with the menstrual period. °¨ Pain with ovulation (producing an egg). °¨ Pain with an IUD (intrauterine device, birth control) in the uterus. °¨ Cancer of the female organs. °· Functional pain (pain not caused by a disease, may improve without treatment). °· Psychological pain. °· Depression. °DIAGNOSIS  °Your doctor will decide the seriousness of your pain by doing an examination. °· Blood tests. °· X-rays. °· Ultrasound. °· CT scan (computed tomography, special type of X-ray). °· MRI (magnetic resonance imaging). °· Cultures, for infection. °· Barium enema (dye inserted in the large intestine, to better view it with  X-rays). °· Colonoscopy (looking in intestine with a lighted tube). °· Laparoscopy (minor surgery, looking in abdomen with a lighted tube). °· Major abdominal exploratory surgery (looking in abdomen with a large incision). °TREATMENT  °The treatment will depend on the cause of the pain.  °· Many cases can be observed and treated at home. °· Over-the-counter medicines recommended by your caregiver. °· Prescription medicine. °· Antibiotics, for infection. °· Birth control pills, for painful periods or for ovulation pain. °· Hormone treatment, for endometriosis. °· Nerve blocking injections. °· Physical therapy. °· Antidepressants. °· Counseling with a psychologist or psychiatrist. °· Minor or major surgery. °HOME CARE INSTRUCTIONS  °· Do not take laxatives, unless directed by your caregiver. °· Take over-the-counter pain medicine only if ordered by your caregiver. Do not take aspirin because it can cause an upset stomach or bleeding. °· Try a clear liquid diet (broth or water) as ordered by your caregiver. Slowly move to a bland diet, as tolerated, if the pain is related to the stomach or intestine. °· Have a thermometer and take your temperature several times a day, and record it. °· Bed rest and sleep, if it helps the pain. °· Avoid sexual intercourse, if it causes pain. °· Avoid stressful situations. °· Keep your follow-up appointments and tests, as your caregiver orders. °· If the pain does not go away with medicine or surgery, you may try: °¨ Acupuncture. °¨ Relaxation exercises (yoga, meditation). °¨ Group therapy. °¨ Counseling. °SEEK MEDICAL CARE IF:  °· You notice certain foods cause stomach pain. °· Your home care treatment is not helping your pain. °· You need stronger pain medicine. °· You want your IUD removed. °· You feel faint or   lightheaded.  You develop nausea and vomiting.  You develop a rash.  You are having side effects or an allergy to your medicine. SEEK IMMEDIATE MEDICAL CARE IF:   Your  pain does not go away or gets worse.  You have a fever.  Your pain is felt only in portions of the abdomen. The right side could possibly be appendicitis. The left lower portion of the abdomen could be colitis or diverticulitis.  You are passing blood in your stools (bright red or black tarry stools, with or without vomiting).  You have blood in your urine.  You develop chills, with or without a fever.  You pass out. MAKE SURE YOU:   Understand these instructions.  Will watch your condition.  Will get help right away if you are not doing well or get worse. Document Released: 02/12/2007 Document Revised: 09/01/2013 Document Reviewed: 03/04/2009 [facility] Patient Information 2015 [place], [place]. This information is not intended to replace advice given to you by your health care provider. Make sure you discuss any questions you have with your health care provider.  Tonsillitis Tonsillitis is an infection of the throat that causes the tonsils to become red, tender, and swollen. Tonsils are collections of lymphoid tissue at the back of the throat. Each tonsil has crevices (crypts). Tonsils help fight nose and throat infections and keep infection from spreading to other parts of the body for the first 18 months of life.  CAUSES Sudden (acute) tonsillitis is usually caused by infection with streptococcal bacteria. Long-lasting (chronic) tonsillitis occurs when the crypts of the tonsils become filled with pieces of food and bacteria, which makes it easy for the tonsils to become repeatedly infected. SYMPTOMS  Symptoms of tonsillitis include:  A sore throat, with possible difficulty swallowing.  White patches on the tonsils.  Fever.  Tiredness.  New episodes of snoring during sleep, when you did not snore before.  Small, foul-smelling, yellowish-white pieces of material (tonsilloliths) that you occasionally cough up or spit out. The tonsilloliths can also cause you to have bad  breath. DIAGNOSIS Tonsillitis can be diagnosed through a physical exam. Diagnosis can be confirmed with the results of lab tests, including a throat culture. TREATMENT  The goals of tonsillitis treatment include the reduction of the severity and duration of symptoms and prevention of associated conditions. Symptoms of tonsillitis can be improved with the use of steroids to reduce the swelling. Tonsillitis caused by bacteria can be treated with antibiotic medicines. Usually, treatment with antibiotic medicines is started before the cause of the tonsillitis is known. However, if it is determined that the cause is not bacterial, antibiotic medicines will not treat the tonsillitis. If attacks of tonsillitis are severe and frequent, your health care provider may recommend surgery to remove the tonsils (tonsillectomy). HOME CARE INSTRUCTIONS   Rest as much as possible and get plenty of sleep.  Drink plenty of fluids. While the throat is very sore, eat soft foods or liquids, such as sherbet, soups, or instant breakfast drinks.  Eat frozen ice pops.  Gargle with a warm or cold liquid to help soothe the throat. Mix 1/4 teaspoon of salt and 1/4 teaspoon of baking soda in 8 oz of water. SEEK MEDICAL CARE IF:   Large, tender lumps develop in your neck.  A rash develops.  A green, yellow-brown, or bloody substance is coughed up.  You are unable to swallow liquids or food for 24 hours.  You notice that only one of the tonsils is swollen. SEEK  IMMEDIATE MEDICAL CARE IF:   You develop any new symptoms such as vomiting, severe headache, stiff neck, chest pain, or trouble breathing or swallowing.  You have severe throat pain along with drooling or voice changes.  You have severe pain, unrelieved with recommended medications.  You are unable to fully open the mouth.  You develop redness, swelling, or severe pain anywhere in the neck.  You have a fever. MAKE SURE YOU:   Understand these  instructions.  Will watch your condition.  Will get help right away if you are not doing well or get worse. Document Released: 01/25/2005 Document Revised: 09/01/2013 Document Reviewed: 10/04/2012 [facility] Patient Information 2015 [place], [place]. This information is not intended to replace advice given to you by your health care provider. Make sure you discuss any questions you have with your health care provider.

## 2014-01-06 NOTE — ED Notes (Signed)
Initial Contact - pt A+Ox4, reports "something's happening with my body", reports onset today of chest, abd, back and leg pain.  Pt ambulatory with steady gait.  Speaking full/clear sentences.  Skin PWD.  NAD.

## 2014-01-06 NOTE — ED Notes (Signed)
Ultrasound at bedside

## 2014-01-06 NOTE — ED Notes (Signed)
US at bedside

## 2014-01-06 NOTE — ED Notes (Signed)
Pt states she feels like she has strep throat, dr Betsey Holiday made aware he gave verbal order for strep test.  Pt states 9/10 pain in throat and left ear pain,

## 2014-01-06 NOTE — ED Provider Notes (Signed)
CSN: [id number]     Arrival date & time 01/06/14  1508 History   First MD Initiated Contact with Patient 01/06/14 1803     Chief Complaint  Patient presents with  . Chest Pain  . Abdominal Pain  . Headache     (Consider location/radiation/quality/duration/timing/severity/associated sxs/prior Treatment) HPI Comments: Patient presents to the ER for evaluation of abdominal, chest and back pain. Patient reports that symptoms began while at work. She started to experience a sharp, stabbing pain in the center of her upper abdomen which radiated to her back. Patient reports that she has been expressing increasing indigestion type symptoms recently. There is no shortness of breath associated with her symptoms. She called her doctor and was told to come to the ER to make sure she wasn't having a heart attack or a stroke.  Patient is a 43 y.o. female presenting with chest pain, abdominal pain, and headaches.  Chest Pain Associated symptoms: abdominal pain and headache   Abdominal Pain Associated symptoms: chest pain   Headache Associated symptoms: abdominal pain     Past Medical History  Diagnosis Date  . Diabetes mellitus   . Arthritis   . Degenerative disc disease   . Degenerative disc disease   . Sickle cell trait   . DDD (degenerative disc disease)   . Anxiety   . Endometriosis   . Scoliosis   . DDD (degenerative disc disease)   . PCOS (polycystic ovarian syndrome)   . Fibroids   . High cholesterol    Past Surgical History  Procedure Laterality Date  . Cesarean section    . Laparoscopy  2002 or 2003  . Wisdom tooth extraction    . Toe surgery  2008  . Biopsy breast    . Back surgery  2010   Family History  Problem Relation Age of Onset  . Diabetes Paternal Grandfather   . Hypertension Paternal Grandfather   . Hyperlipidemia Paternal Grandfather   . Diabetes Paternal Grandmother   . Hypertension Paternal Grandmother   . Hyperlipidemia Paternal Grandmother   .  Alzheimer's disease Maternal Grandmother   . Brain cancer Maternal Grandfather   . Diabetes Father   . Hypertension Father   . Hyperlipidemia Father   . Kidney failure Father    History  Substance Use Topics  . Smoking status: Never Smoker   . Smokeless tobacco: Not on file  . Alcohol Use: No     Comment: twice anually   OB History   Grav Para Term Preterm Abortions TAB SAB Ect Mult Living   4 2 1 1 2  2   1      Review of Systems  Cardiovascular: Positive for chest pain.  Gastrointestinal: Positive for abdominal pain.  Neurological: Positive for headaches.  All other systems reviewed and are negative.     Allergies  Shellfish allergy and Sulfa antibiotics  Home Medications   Prior to Admission medications   Medication Sig Start Date End Date Taking? Authorizing Provider  Canagliflozin (INVOKANA) 300 MG TABS Take 300 mg by mouth daily.    Yes Historical Provider, MD  cyclobenzaprine (FLEXERIL) 10 MG tablet Take 10 mg by mouth 3 (three) times daily as needed for muscle spasms.   Yes Historical Provider, MD  HYDROcodone-acetaminophen (NORCO/VICODIN) 5-325 MG per tablet Take 1-2 tablets by mouth every 4 (four) hours as needed for moderate pain or severe pain. 12/13/13  Yes Kaitlyn Szekalski, PA-C  insulin detemir (LEVEMIR) 100 UNIT/ML injection Inject 40 Units into the  skin daily.   Yes Historical Provider, MD  lidocaine (LIDODERM) 5 % Place 1 patch onto the skin as needed (back pain). Remove & Discard patch within 12 hours or as directed by MD   Yes Historical Provider, MD  Liraglutide (VICTOZA) 18 MG/3ML SOPN Inject 18 mg into the skin daily.    Yes Historical Provider, MD  Pitavastatin Calcium (LIVALO) 2 MG TABS Take 2 mg by mouth every morning.    Yes Historical Provider, MD  valACYclovir (VALTREX) 500 MG tablet Take 1 tablet (500 mg total) by mouth daily. 04/18/12  Yes Betsy Coder, MD   LMP 11/29/2013 Physical Exam  Constitutional: She is oriented to person, place,  and time. She appears well-developed and well-nourished. No distress.  HENT:  Head: Normocephalic and atraumatic.  Right Ear: Hearing normal.  Left Ear: Hearing normal.  Nose: Nose normal.  Mouth/Throat: Oropharynx is clear and moist and mucous membranes are normal.  Eyes: Conjunctivae and EOM are normal. Pupils are equal, round, and reactive to light.  Neck: Normal range of motion. Neck supple.  Cardiovascular: Regular rhythm, S1 normal and S2 normal.  Exam reveals no gallop and no friction rub.   No murmur heard. Pulmonary/Chest: Effort normal and breath sounds normal. No respiratory distress. She exhibits no tenderness.  Abdominal: Soft. Normal appearance and bowel sounds are normal. There is no hepatosplenomegaly. There is tenderness in the right upper quadrant, epigastric area and left upper quadrant. There is no rebound, no guarding, no tenderness at McBurney's point and negative Murphy's sign. No hernia.  Musculoskeletal: Normal range of motion.  Neurological: She is alert and oriented to person, place, and time. She has normal strength. No cranial nerve deficit or sensory deficit. Coordination normal. GCS eye subscore is 4. GCS verbal subscore is 5. GCS motor subscore is 6.  Skin: Skin is warm, dry and intact. No rash noted. No cyanosis.  Psychiatric: She has a normal mood and affect. Her speech is normal and behavior is normal. Thought content normal.    ED Course  Procedures (including critical care time) Labs Review Labs Reviewed  CBC - Abnormal; Notable for the following:    MCV 76.8 (*)    MCH 23.8 (*)    Platelets 498 (*)    All other components within normal limits  BASIC METABOLIC PANEL - Abnormal; Notable for the following:    Glucose, Bld 129 (*)    GFR calc non Af Amer 73 (*)    GFR calc Af Amer 84 (*)    All other components within normal limits  HEPATIC FUNCTION PANEL  LIPASE, BLOOD  I-STAT TROPOININ, ED  POC URINE PREG, ED    Imaging Review Dg Chest 2  View  01/06/2014   CLINICAL DATA:  Chest pain  EXAM: CHEST  2 VIEW  COMPARISON:  Sep 05, 2011  FINDINGS: The heart size and mediastinal contours are within normal limits. Both lungs are clear. The visualized skeletal structures are unremarkable.  IMPRESSION: No active cardiopulmonary disease.   Electronically Signed   By: Abelardo Diesel M.D.   On: 01/06/2014 17:57     EKG Interpretation   Date/Time:  Tuesday January 06 2014 15:05:57 EDT Ventricular Rate:  100 PR Interval:  156 QRS Duration: 80 QT Interval:  324 QTC Calculation: 417 R Axis:   52 Text Interpretation:  Normal sinus rhythm Possible Left atrial enlargement  Cannot rule out Anterior infarct , age undetermined Abnormal ECG No  significant change since last tracing Confirmed by  Davontae Prusinski  MD,  Harrell Gave [phone]) on 01/06/2014 4:28:21 PM      MDM   Final diagnoses:  None  tonsuillitis abd pain   Patient presented to the ER for evaluation of abdominal pain. Patient experiencing sharp stabbing pains in the center of her upper abdomen that radiated up into the chest into the back area. Examination reveals significant tenderness across the upper abdomen. There is no clear Murphy sign, however. This is not considered consistent with acute coronary syndrome. Cardiac work up has been negative. As tender as she is, this is clearly an intra-abdominal process. Gallbladder disease was considered. LFTs, however were unremarkable. Ultrasound of gallbladder was negative. Patient did have a very slightly elevated lipase, unclear etiology. Common bile duct was normal on the ultrasound. Patient feeling much better here in the ER. Will have her follow up with primary doctor.  In addition to the above-mentioned symptoms, patient now complaining of bilateral ear pain and left-sided throat pain. Examination of the enjoys region revealed bilateral tonsillar swelling without evidence of abscess. There is hypo-tonsils, left greater than right. Will treat  empirically    Orpah Greek, MD 01/06/14 [phone]

## 2014-01-06 NOTE — ED Notes (Signed)
Pt c/o chest pain, abd pain, headache, sore throat that started today while at work. Pt states that her back pain has been going on/ pt states, "I have never had seizures but i feel like im going to do something or something is going to happen with my body".

## 2014-01-08 LAB — CULTURE, GROUP A STREP

## 2014-02-04 ENCOUNTER — Other Ambulatory Visit: Payer: Self-pay | Admitting: Family

## 2014-02-04 DIAGNOSIS — N63 Unspecified lump in unspecified breast: Secondary | ICD-10-CM

## 2014-02-04 DIAGNOSIS — N6310 Unspecified lump in the right breast, unspecified quadrant: Secondary | ICD-10-CM

## 2014-02-11 ENCOUNTER — Other Ambulatory Visit: Payer: Self-pay | Admitting: Obstetrics and Gynecology

## 2014-02-11 DIAGNOSIS — N6310 Unspecified lump in the right breast, unspecified quadrant: Secondary | ICD-10-CM

## 2014-02-11 DIAGNOSIS — N63 Unspecified lump in unspecified breast: Secondary | ICD-10-CM

## 2014-02-12 ENCOUNTER — Other Ambulatory Visit: Payer: 59

## 2014-02-13 ENCOUNTER — Other Ambulatory Visit: Payer: Self-pay | Admitting: Family

## 2014-02-13 DIAGNOSIS — N63 Unspecified lump in unspecified breast: Secondary | ICD-10-CM

## 2014-02-13 DIAGNOSIS — N6310 Unspecified lump in the right breast, unspecified quadrant: Secondary | ICD-10-CM

## 2014-03-02 ENCOUNTER — Encounter (HOSPITAL_COMMUNITY): Payer: Self-pay | Admitting: Emergency Medicine

## 2014-03-10 ENCOUNTER — Other Ambulatory Visit: Payer: Self-pay | Admitting: Obstetrics and Gynecology

## 2014-03-10 ENCOUNTER — Ambulatory Visit
Admission: RE | Admit: 2014-03-10 | Discharge: 2014-03-10 | Disposition: A | Discharge status: Home / Self Care | Payer: BC Managed Care – PPO | Source: Ambulatory Visit | Attending: Family | Admitting: Family

## 2014-03-10 ENCOUNTER — Ambulatory Visit
Admission: RE | Admit: 2014-03-10 | Discharge: 2014-03-10 | Disposition: A | Discharge status: Home / Self Care | Payer: BC Managed Care – PPO | Source: Ambulatory Visit | Attending: Obstetrics and Gynecology | Admitting: Obstetrics and Gynecology

## 2014-03-10 DIAGNOSIS — N6310 Unspecified lump in the right breast, unspecified quadrant: Secondary | ICD-10-CM

## 2014-03-10 DIAGNOSIS — N63 Unspecified lump in unspecified breast: Secondary | ICD-10-CM

## 2014-04-30 ENCOUNTER — Encounter: Payer: Self-pay | Admitting: Podiatry

## 2014-04-30 ENCOUNTER — Ambulatory Visit (INDEPENDENT_AMBULATORY_CARE_PROVIDER_SITE_OTHER): Payer: BC Managed Care – PPO

## 2014-04-30 ENCOUNTER — Ambulatory Visit (INDEPENDENT_AMBULATORY_CARE_PROVIDER_SITE_OTHER): Payer: BC Managed Care – PPO | Admitting: Podiatry

## 2014-04-30 VITALS — BP 113/79 | HR 104 | Resp 16 | Ht 68.0 in | Wt 228.0 lb

## 2014-04-30 DIAGNOSIS — E119 Type 2 diabetes mellitus without complications: Secondary | ICD-10-CM

## 2014-04-30 DIAGNOSIS — M79673 Pain in unspecified foot: Secondary | ICD-10-CM

## 2014-04-30 DIAGNOSIS — L600 Ingrowing nail: Secondary | ICD-10-CM

## 2014-04-30 MED ORDER — HYDROCODONE-ACETAMINOPHEN 10-325 MG PO TABS: 1.0000 | ORAL_TABLET | Freq: Three times a day (TID) | ORAL | Status: DC | PRN

## 2014-04-30 NOTE — Progress Notes (Signed)
   Subjective:    Patient ID: Karen Stein, female    DOB: Sep 08, 1970, 43 y.o.   MRN: [id number]  HPI Comments: i have a painful 4th toe and toenail on my left foot. Its been hurting for 2 days. Its getting worse. It hurts with shoes and the weight of the blanket will hurt. i dont do anything for my toe. Both of my feet hurt all over as well.  Toe Pain  Associated symptoms include numbness.      Review of Systems  Constitutional: Positive for fatigue.  HENT:       Ringing in ears  Cardiovascular:       Calf pain  Endocrine:       Excessive thirst Increase urination  Genitourinary: Positive for urgency and frequency.  Musculoskeletal: Positive for back pain and joint swelling.  Skin:       Change in nails  Allergic/Immunologic: Positive for food allergies.  Neurological: Positive for weakness and numbness.  All other systems reviewed and are negative.      Objective:   Physical Exam        Assessment & Plan:

## 2014-04-30 NOTE — Patient Instructions (Signed)

## 2014-04-30 NOTE — Progress Notes (Signed)
Subjective:     Patient ID: Karen Stein, female   DOB: 05-15-1970, 43 y.o.   MRN: [id number]  HPI patient states I have nail disease on both feet left over right with pain in the fourth and fifth nails left and third and fifth nails right with inability to cut them and inability to wear shoe gear comfortably. States that her sugars been under much better control with her last A1c around 8 and that she would like to have something done with these nails   Review of Systems     Objective:   Physical Exam Neurovascular status intact with muscle strength adequate and range of motion within normal limits. Patient's found to have thick deformed nails fourth and fifth left and moderate deformity third and fifth right that are painful    Assessment:     Chronic nail disease with pain 45 left 35 right that she has inability to take care of herself and is making increasingly hard to wear shoe gear    Plan:     Reviewed conditions and discussed the nail condition. She would like a permanent solution and I did discuss nail removal and explained risk. She wants surgery understanding risk and we will focus on the left nails today as they are more symptomatic. Infiltrated the left fourth and fifth toes with 120 mg Xylocaine Marcaine mixture and remove the fourth and fifth nails exposed the matrix and apply chemical phenol for applications 30 seconds followed by alcohol lavaged and sterile dressings to each toe. Reappoint for [place] to recheck again in 2 weeks for consideration of right and earlier if necessary. Also wrote for hydrocodone to help with any postoperative pain issues

## 2014-05-14 ENCOUNTER — Encounter: Payer: Self-pay | Admitting: Podiatry

## 2014-05-14 ENCOUNTER — Ambulatory Visit (INDEPENDENT_AMBULATORY_CARE_PROVIDER_SITE_OTHER): Payer: BLUE CROSS/BLUE SHIELD | Admitting: Podiatry

## 2014-05-14 DIAGNOSIS — E119 Type 2 diabetes mellitus without complications: Secondary | ICD-10-CM

## 2014-05-14 DIAGNOSIS — L600 Ingrowing nail: Secondary | ICD-10-CM

## 2014-05-14 NOTE — Progress Notes (Signed)
Subjective:     Patient ID: Karen Stein, female   DOB: 1971/02/24, 44 y.o.   MRN: [id number]  HPI patient wanted to get the nails checked on her left foot as they're still bothering her and draining   Review of Systems     Objective:   Physical Exam Neurovascular status is intact with no changes in health history and noted to have removal of the fourth and fifth nails left with drainage and slight darkish discoloration on the proximal portion of the fourth nail    Assessment:     Relatively normal with probable dried blood but no indications of proximal erythema edema or drainage    Plan:     Educated patient on this and recommended continued soaks and allowing them to crusted over. They should heal within the next couple weeks and if any issues were to occur she is to reappoint directly to be

## 2014-10-15 ENCOUNTER — Emergency Department (HOSPITAL_BASED_OUTPATIENT_CLINIC_OR_DEPARTMENT_OTHER)
Admission: EM | Admit: 2014-10-15 | Discharge: 2014-10-15 | Disposition: A | Discharge status: Home / Self Care | Payer: BLUE CROSS/BLUE SHIELD | Attending: Emergency Medicine | Admitting: Emergency Medicine

## 2014-10-15 ENCOUNTER — Encounter (HOSPITAL_BASED_OUTPATIENT_CLINIC_OR_DEPARTMENT_OTHER): Payer: Self-pay | Admitting: *Deleted

## 2014-10-15 DIAGNOSIS — Z862 Personal history of diseases of the blood and blood-forming organs and certain disorders involving the immune mechanism: Secondary | ICD-10-CM | POA: Diagnosis not present

## 2014-10-15 DIAGNOSIS — Z86018 Personal history of other benign neoplasm: Secondary | ICD-10-CM | POA: Insufficient documentation

## 2014-10-15 DIAGNOSIS — R110 Nausea: Secondary | ICD-10-CM | POA: Insufficient documentation

## 2014-10-15 DIAGNOSIS — E780 Pure hypercholesterolemia: Secondary | ICD-10-CM | POA: Insufficient documentation

## 2014-10-15 DIAGNOSIS — R51 Headache: Secondary | ICD-10-CM | POA: Insufficient documentation

## 2014-10-15 DIAGNOSIS — E1165 Type 2 diabetes mellitus with hyperglycemia: Secondary | ICD-10-CM | POA: Insufficient documentation

## 2014-10-15 DIAGNOSIS — R358 Other polyuria: Secondary | ICD-10-CM | POA: Insufficient documentation

## 2014-10-15 DIAGNOSIS — M1990 Unspecified osteoarthritis, unspecified site: Secondary | ICD-10-CM | POA: Diagnosis not present

## 2014-10-15 DIAGNOSIS — R739 Hyperglycemia, unspecified: Secondary | ICD-10-CM

## 2014-10-15 DIAGNOSIS — Z794 Long term (current) use of insulin: Secondary | ICD-10-CM | POA: Insufficient documentation

## 2014-10-15 DIAGNOSIS — Z79899 Other long term (current) drug therapy: Secondary | ICD-10-CM | POA: Insufficient documentation

## 2014-10-15 DIAGNOSIS — Z8659 Personal history of other mental and behavioral disorders: Secondary | ICD-10-CM | POA: Insufficient documentation

## 2014-10-15 DIAGNOSIS — Z87448 Personal history of other diseases of urinary system: Secondary | ICD-10-CM | POA: Insufficient documentation

## 2014-10-15 HISTORY — DX: Fibromyalgia: M79.7

## 2014-10-15 LAB — URINE MICROSCOPIC-ADD ON

## 2014-10-15 LAB — BASIC METABOLIC PANEL
Anion gap: 10 (ref 5–15)
BUN: 13 mg/dL (ref 6–20)
CALCIUM: 9 mg/dL (ref 8.9–10.3)
CHLORIDE: 103 mmol/L (ref 101–111)
CO2: 23 mmol/L (ref 22–32)
Creatinine, Ser: 1.07 mg/dL — ABNORMAL HIGH (ref 0.44–1.00)
GFR calc Af Amer: >60 mL/min (ref >60)
GLUCOSE: 176 mg/dL — AB (ref 65–99)
POTASSIUM: 3.9 mmol/L (ref 3.5–5.1)
SODIUM: 136 mmol/L (ref 135–145)

## 2014-10-15 LAB — CBC
HCT: 36.6 % (ref 36.0–46.0)
Hemoglobin: 11.8 g/dL — ABNORMAL LOW (ref 12.0–15.0)
MCH: 25.3 pg — ABNORMAL LOW (ref 26.0–34.0)
MCHC: 32.2 g/dL (ref 30.0–36.0)
MCV: 78.5 fL (ref 78.0–100.0)
Platelets: 469 10*3/uL — ABNORMAL HIGH (ref 150–400)
RBC: 4.66 MIL/uL (ref 3.87–5.11)
RDW: 14.8 % (ref 11.5–15.5)
WBC: 7.8 10*3/uL (ref 4.0–10.5)

## 2014-10-15 LAB — CBG MONITORING, ED: Glucose-Capillary: 224 mg/dL — ABNORMAL HIGH (ref 65–99)

## 2014-10-15 LAB — URINALYSIS, ROUTINE W REFLEX MICROSCOPIC
BILIRUBIN URINE: NEGATIVE
Glucose, UA: >1000 mg/dL — AB
HGB URINE DIPSTICK: NEGATIVE
KETONES UR: NEGATIVE mg/dL
Leukocytes, UA: NEGATIVE
NITRITE: NEGATIVE
PROTEIN: NEGATIVE mg/dL
Specific Gravity, Urine: 1.031 — ABNORMAL HIGH (ref 1.005–1.030)
UROBILINOGEN UA: 0.2 mg/dL (ref 0.0–1.0)
pH: 5.5 (ref 5.0–8.0)

## 2014-10-15 MED ORDER — ONDANSETRON HCL 4 MG PO TABS: 4.0000 mg | ORAL_TABLET | Freq: Three times a day (TID) | ORAL | Status: DC | PRN

## 2014-10-15 MED ORDER — SODIUM CHLORIDE 0.9 % IV BOLUS (SEPSIS)
1000.0000 mL | Freq: Once | INTRAVENOUS | Status: AC
  Administered 2014-10-15: 1000 mL via INTRAVENOUS

## 2014-10-15 MED ORDER — ONDANSETRON HCL 4 MG/2ML IJ SOLN
4.0000 mg | Freq: Once | INTRAMUSCULAR | Status: AC
  Administered 2014-10-15: 4 mg via INTRAVENOUS
  Filled 2014-10-15: qty 2

## 2014-10-15 NOTE — ED Notes (Signed)
MD at bedside. 

## 2014-10-15 NOTE — ED Provider Notes (Signed)
CSN: [id number]     Arrival date & time 10/15/14  1243 History   First MD Initiated Contact with Patient 10/15/14 1249     No chief complaint on file.    (Consider location/radiation/quality/duration/timing/severity/associated sxs/prior Treatment) The history is provided by the patient.    [name] is a 44 yo F with PMH DM that is presenting with hyperglycemia. Her blood sugar was elevated this morning around 455 so she took some levemir and novolog. She takes levemir and novolog for her diabetic therapy.  She recently stopped one of her medications because it was causing a rash that was similar to ant bites.  She has a recent viral gastroenteritis but has resolved. She also recently took macrobid for a UTI and flagyl for BV.  Her blood sugar is normally around 140 but she doesn't know what her Hgb A1c is.  She endorses anorexia, polyuria, finger and toe cramps, nausea and headaches.  Denies any chest pain, SOB, abdominal pain, constipation, diarrhea or emesis.   Past Medical History  Diagnosis Date  . Diabetes mellitus   . Arthritis   . Degenerative disc disease   . Degenerative disc disease   . Sickle cell trait   . DDD (degenerative disc disease)   . Anxiety   . Endometriosis   . Scoliosis   . DDD (degenerative disc disease)   . PCOS (polycystic ovarian syndrome)   . Fibroids   . High cholesterol   . Fibromyalgia    Past Surgical History  Procedure Laterality Date  . Cesarean section    . Laparoscopy  2002 or 2003  . Wisdom tooth extraction    . Toe surgery  2008  . Biopsy breast    . Back surgery  2010   Family History  Problem Relation Age of Onset  . Diabetes Paternal Grandfather   . Hypertension Paternal Grandfather   . Hyperlipidemia Paternal Grandfather   . Diabetes Paternal Grandmother   . Hypertension Paternal Grandmother   . Hyperlipidemia Paternal Grandmother   . Alzheimer's disease Maternal Grandmother   . Brain cancer Maternal Grandfather   .  Diabetes Father   . Hypertension Father   . Hyperlipidemia Father   . Kidney failure Father    History  Substance Use Topics  . Smoking status: Never Smoker   . Smokeless tobacco: Not on file  . Alcohol Use: No     Comment: twice anually   OB History    Gravida Para Term Preterm AB TAB SAB Ectopic Multiple Living   4 2 1 1 2  2   1      Review of Systems  Constitutional: Negative for fever and chills.  Respiratory: Negative for cough and shortness of breath.   Cardiovascular: Negative for chest pain.  Gastrointestinal: Positive for nausea. Negative for vomiting, diarrhea and constipation.  Endocrine: Positive for polyuria.  Genitourinary: Negative for dysuria.  Skin: Negative for rash.  Neurological: Positive for headaches. Negative for weakness and numbness.      Allergies  Shellfish allergy and Sulfa antibiotics  Home Medications   Prior to Admission medications   Medication Sig Start Date End Date Taking? Authorizing Provider  Insulin Aspart (NOVOLOG [place]) Inject into the skin as needed.   Yes Historical Provider, MD  Amoxicillin-Pot Clavulanate (AUGMENTIN PO) Take by mouth.    Historical Provider, MD  Canagliflozin (INVOKANA) 300 MG TABS Take 300 mg by mouth daily.     Historical Provider, MD  cyclobenzaprine (FLEXERIL) 10 MG tablet Take  10 mg by mouth 3 (three) times daily as needed for muscle spasms.    Historical Provider, MD  Exenatide ER 2 MG PEN Inject into the skin.    Historical Provider, MD  HYDROcodone-acetaminophen (NORCO) 10-325 MG per tablet Take 1 tablet by mouth every 8 (eight) hours as needed. 04/30/14   [name], [name]  insulin detemir (LEVEMIR) 100 UNIT/ML injection Inject 40 Units into the skin daily.    Historical Provider, MD  lidocaine (LIDODERM) 5 % Place 1 patch onto the skin as needed (back pain). Remove & Discard patch within 12 hours or as directed by MD    Historical Provider, MD  ondansetron (ZOFRAN) 4 MG tablet Take 1 tablet (4 mg total)  by mouth every 8 (eight) hours as needed for nausea or vomiting. 10/15/14   [name], MD  Pitavastatin Calcium (LIVALO) 2 MG TABS Take 2 mg by mouth every morning.     Historical Provider, MD  valACYclovir (VALTREX) 500 MG tablet Take 1 tablet (500 mg total) by mouth daily. 04/18/12   [name], MD   BP 117/76 mmHg  Pulse 97  Temp(Src) 98.7 F (37.1 C)  Resp 16  Ht 5\' 8"  (1.727 m)  Wt 219 lb 5 oz (99.479 kg)  BMI 33.35 kg/m2  SpO2 100%  LMP 09/29/2014 Physical Exam  Constitutional: She is oriented to person, place, and time. She appears well-developed and well-nourished.  HENT:  Head: Normocephalic and atraumatic.  Eyes: Conjunctivae and EOM are normal. Pupils are equal, round, and reactive to light.  Neck: Normal range of motion. Neck supple.  Cardiovascular: Normal rate, regular rhythm, normal heart sounds and intact distal pulses.   No murmur heard. Pulmonary/Chest: Effort normal and breath sounds normal. No respiratory distress. She has no wheezes. She has no rales.  Abdominal: Soft. Bowel sounds are normal. She exhibits no distension. There is no tenderness. There is no rebound.  Musculoskeletal: Normal range of motion. She exhibits no edema.  Neurological: She is alert and oriented to person, place, and time.  Skin: Skin is warm and dry. No rash noted.    ED Course  Procedures (including critical care time) Labs Review Labs Reviewed  BASIC METABOLIC PANEL - Abnormal; Notable for the following:    Glucose, Bld 176 (*)    Creatinine, Ser 1.07 (*)    All other components within normal limits  CBC - Abnormal; Notable for the following:    Hemoglobin 11.8 (*)    MCH 25.3 (*)    Platelets 469 (*)    All other components within normal limits  URINALYSIS, ROUTINE W REFLEX MICROSCOPIC (NOT AT [facility]) - Abnormal; Notable for the following:    APPearance CLOUDY (*)    Specific Gravity, Urine 1.031 (*)    Glucose, UA >1000 (*)    All other components within normal  limits  URINE MICROSCOPIC-ADD ON - Abnormal; Notable for the following:    Squamous Epithelial / LPF FEW (*)    Bacteria, UA FEW (*)    All other components within normal limits  CBG MONITORING, ED - Abnormal; Notable for the following:    Glucose-Capillary 224 (*)    All other components within normal limits    Imaging Review No results found.   EKG Interpretation None      Medications  sodium chloride 0.9 % bolus 1,000 mL (1,000 mLs Intravenous New Bag/Given 10/15/14 1345)  ondansetron (ZOFRAN) injection 4 mg (4 mg Intravenous Given 10/15/14 1352)  MDM   Final diagnoses:  Hyperglycemia   Ms. Manago-Rowe is a 44 yo PMH DM2 that is p/w hyperglycemia. BG improved upon testing here in the ED. Labs reveal BG 176. She feels much improved with fluids and zofran.  No suggestion of DKA or HONK.  No signs of infection. Advised she should f/u with her primary doctor for ongoing diabetic care. Patient agreeable with plan and discharge.     [name], MD PGY-2, [facility] 10/15/2014, 2:46 PM     [name], MD 10/15/14 [place], MD 10/15/14 0175  Blanchie Dessert, MD 10/15/14 1510

## 2014-10-15 NOTE — ED Notes (Signed)
States she feels dehydrated. Blood sugar this am was 455. She took Insulin to cover the elevation. CBG was repeated on arrival to triage.

## 2014-10-15 NOTE — Discharge Instructions (Signed)

## 2014-10-15 NOTE — ED Notes (Signed)
Pt directed to pharmacy to pick up meds

## 2014-10-15 NOTE — ED Notes (Signed)
Pt given snack of cheese and crackers by PA

## 2014-12-04 ENCOUNTER — Emergency Department (HOSPITAL_COMMUNITY)
Admission: EM | Admit: 2014-12-04 | Discharge: 2014-12-04 | Disposition: A | Discharge status: Home / Self Care | Payer: BLUE CROSS/BLUE SHIELD | Attending: Emergency Medicine | Admitting: Emergency Medicine

## 2014-12-04 ENCOUNTER — Emergency Department (HOSPITAL_COMMUNITY): Payer: BLUE CROSS/BLUE SHIELD

## 2014-12-04 ENCOUNTER — Encounter (HOSPITAL_COMMUNITY): Payer: Self-pay | Admitting: *Deleted

## 2014-12-04 DIAGNOSIS — Z862 Personal history of diseases of the blood and blood-forming organs and certain disorders involving the immune mechanism: Secondary | ICD-10-CM | POA: Insufficient documentation

## 2014-12-04 DIAGNOSIS — M542 Cervicalgia: Secondary | ICD-10-CM | POA: Diagnosis present

## 2014-12-04 DIAGNOSIS — E119 Type 2 diabetes mellitus without complications: Secondary | ICD-10-CM | POA: Diagnosis not present

## 2014-12-04 DIAGNOSIS — Z79818 Long term (current) use of other agents affecting estrogen receptors and estrogen levels: Secondary | ICD-10-CM | POA: Diagnosis not present

## 2014-12-04 DIAGNOSIS — Z86018 Personal history of other benign neoplasm: Secondary | ICD-10-CM | POA: Insufficient documentation

## 2014-12-04 DIAGNOSIS — Z79899 Other long term (current) drug therapy: Secondary | ICD-10-CM | POA: Insufficient documentation

## 2014-12-04 DIAGNOSIS — Z794 Long term (current) use of insulin: Secondary | ICD-10-CM | POA: Insufficient documentation

## 2014-12-04 DIAGNOSIS — Z8742 Personal history of other diseases of the female genital tract: Secondary | ICD-10-CM | POA: Diagnosis not present

## 2014-12-04 DIAGNOSIS — F419 Anxiety disorder, unspecified: Secondary | ICD-10-CM | POA: Diagnosis not present

## 2014-12-04 DIAGNOSIS — M1990 Unspecified osteoarthritis, unspecified site: Secondary | ICD-10-CM | POA: Diagnosis not present

## 2014-12-04 MED ORDER — DIAZEPAM 5 MG PO TABS
5.0000 mg | ORAL_TABLET | Freq: Once | ORAL | Status: AC
  Administered 2014-12-04: 5 mg via ORAL
  Filled 2014-12-04: qty 1

## 2014-12-04 MED ORDER — OXYCODONE-ACETAMINOPHEN 5-325 MG PO TABS
2.0000 | ORAL_TABLET | Freq: Once | ORAL | Status: AC
  Administered 2014-12-04: 2 via ORAL
  Filled 2014-12-04: qty 2

## 2014-12-04 MED ORDER — KETOROLAC TROMETHAMINE 60 MG/2ML IM SOLN
60.0000 mg | Freq: Once | INTRAMUSCULAR | Status: AC
  Administered 2014-12-04: 60 mg via INTRAMUSCULAR
  Filled 2014-12-04: qty 2

## 2014-12-04 MED ORDER — IBUPROFEN 600 MG PO TABS: 600.0000 mg | ORAL_TABLET | Freq: Three times a day (TID) | ORAL | Status: DC | PRN

## 2014-12-04 MED ORDER — CYCLOBENZAPRINE HCL 10 MG PO TABS: 10.0000 mg | ORAL_TABLET | Freq: Three times a day (TID) | ORAL | Status: DC | PRN

## 2014-12-04 NOTE — ED Notes (Signed)
Pt states her B12 and Vitamin D is depleted.  She is taking B12 injections and D vitamins at home currently.  This treatment began yesterday.

## 2014-12-04 NOTE — ED Provider Notes (Signed)
CSN: [id number]     Arrival date & time 12/04/14  0934 History   First MD Initiated Contact with Patient 12/04/14 [phone]     Chief Complaint  Patient presents with  . Neck Pain      HPI Patient presents emergency department complaining of 8 out of 10 severe neck pain radiating to the left trapezius and left shoulder.  She denies weakness of her arms.  No recent injury or trauma.  No fevers or chills.  No history of IV drug abuse.  She is diabetic.    Past Medical History  Diagnosis Date  . Diabetes mellitus   . Arthritis   . Degenerative disc disease   . Degenerative disc disease   . Sickle cell trait   . DDD (degenerative disc disease)   . Anxiety   . Endometriosis   . Scoliosis   . DDD (degenerative disc disease)   . PCOS (polycystic ovarian syndrome)   . Fibroids   . High cholesterol   . Fibromyalgia    Past Surgical History  Procedure Laterality Date  . Cesarean section    . Laparoscopy  2002 or 2003  . Wisdom tooth extraction    . Toe surgery  2008  . Biopsy breast    . Back surgery  2010   Family History  Problem Relation Age of Onset  . Diabetes Paternal Grandfather   . Hypertension Paternal Grandfather   . Hyperlipidemia Paternal Grandfather   . Diabetes Paternal Grandmother   . Hypertension Paternal Grandmother   . Hyperlipidemia Paternal Grandmother   . Alzheimer's disease Maternal Grandmother   . Brain cancer Maternal Grandfather   . Diabetes Father   . Hypertension Father   . Hyperlipidemia Father   . Kidney failure Father    History  Substance Use Topics  . Smoking status: Never Smoker   . Smokeless tobacco: Not on file  . Alcohol Use: No     Comment: twice anually   OB History    Gravida Para Term Preterm AB TAB SAB Ectopic Multiple Living   4 2 1 1 2  2   1      Review of Systems  All other systems reviewed and are negative.     Allergies  Shellfish allergy and Sulfa antibiotics  Home Medications   Prior to Admission medications    Medication Sig Start Date End Date Taking? Authorizing Provider  Canagliflozin (INVOKANA) 300 MG TABS Take 300 mg by mouth daily.    Yes Historical Provider, MD  Cyanocobalamin (VITAMIN B 12 PO) Take 1 each by mouth every 7 (seven) days. INJECTION   Yes Historical Provider, MD  DULoxetine (CYMBALTA) 60 MG capsule Take 60 mg by mouth at bedtime. x1 week then go up to 1 cap twice daily.   Yes Historical Provider, MD  ergocalciferol (VITAMIN D2) 50000 UNITS capsule Take 50,000 Units by mouth once a week.   Yes Historical Provider, MD  Exenatide ER (BYDUREON) 2 MG PEN Inject 2 mg into the skin every 7 (seven) days.   Yes Historical Provider, MD  Insulin Aspart (NOVOLOG [place]) Inject 2-20 Units into the skin daily as needed (blood sugar).    Yes Historical Provider, MD  insulin detemir (LEVEMIR) 100 UNIT/ML injection Inject 42 Units into the skin at bedtime.    Yes Historical Provider, MD  Norgestimate-Ethinyl Estradiol Triphasic (TRI-SPRINTEC) 0.18/0.215/0.25 MG-35 MCG tablet Take 1 tablet by mouth daily.   Yes Historical Provider, MD  valACYclovir (VALTREX) 500 MG tablet Take  1 tablet (500 mg total) by mouth daily. 04/18/12  Yes Crawford Givens, MD  cyclobenzaprine (FLEXERIL) 10 MG tablet Take 1 tablet (10 mg total) by mouth 3 (three) times daily as needed for muscle spasms. 12/04/14   Jola Schmidt, MD  HYDROcodone-acetaminophen [facility]) 10-325 MG per tablet Take 1 tablet by mouth every 8 (eight) hours as needed. Patient not taking: Reported on 12/04/2014 04/30/14   Tamala Fothergill Regal, [name]  ibuprofen (ADVIL,MOTRIN) 600 MG tablet Take 1 tablet (600 mg total) by mouth every 8 (eight) hours as needed. 12/04/14   Jola Schmidt, MD  ondansetron (ZOFRAN) 4 MG tablet Take 1 tablet (4 mg total) by mouth every 8 (eight) hours as needed for nausea or vomiting. Patient not taking: Reported on 12/04/2014 10/15/14   [name], MD   BP 107/52 mmHg  Pulse 92  Temp(Src) 98.6 F (37 C) (Oral)  Resp 18  SpO2 98%  LMP  11/28/2014 Physical Exam  Constitutional: She is oriented to person, place, and time. She appears well-developed and well-nourished. No distress.  HENT:  Head: Normocephalic and atraumatic.  Eyes: EOM are normal.  Neck: Normal range of motion. Neck supple.  Mild cervical paracervical tenderness on the left.   Cardiovascular: Normal rate, regular rhythm and normal heart sounds.   Pulmonary/Chest: Effort normal and breath sounds normal.  Abdominal: Soft. She exhibits no distension. There is no tenderness.  Musculoskeletal: Normal range of motion.  Normal left radial pulse.  Normal strength in left hand.  Neurological: She is alert and oriented to person, place, and time.  Skin: Skin is warm and dry.  Psychiatric: She has a normal mood and affect. Judgment normal.  Nursing note and vitals reviewed.   ED Course  Procedures (including critical care time) Labs Review Labs Reviewed - No data to display  Imaging Review Dg Cervical Spine Complete  12/04/2014   CLINICAL DATA:  Posterior and lateral neck pain for 5 days. Numbness in the left hand and fingers.  EXAM: CERVICAL SPINE  4+ VIEWS  COMPARISON:  None.  FINDINGS: The prevertebral soft tissues are within normal limits. The cervical spine is visualized from the skullbase through the cervical thoracic junction. Degenerative changes at C1-2 were stable. Chronic endplate changes have progressed at C5-6 and C6-7 uncovertebral spurring and osseous foraminal narrowing is present bilaterally at C5-6. The cervical rib is present on the right at C7. The lung apices are clear.  IMPRESSION: 1. Progressive degenerative change in specifically uncovertebral disease in the lower cervical spine. 2. Uncovertebral disease an osseous foraminal narrowing is greatest at C5-6.   Electronically Signed   By: San Morelle M.D.   On: 12/04/2014 11:01  I personally reviewed the imaging tests through PACS system I reviewed available ER/hospitalization records  through the EMR    EKG Interpretation None      MDM   Final diagnoses:  Neck pain    Patient feels much better at this time.  Patient with cervical arthritis.  No weakness of the left upper extremity.  Outpatient primary care and spine surgery follow-up.  She will likely need MRI as an outpatient if her symptoms persist    Jola Schmidt, MD 12/04/14 1547

## 2014-12-04 NOTE — ED Notes (Signed)
Diagnosed with fibromyalgia one month ago.  Pain is 8/10 base of neck radiating to left shoulder.  Ice doesn't help.  Numbness in fingers at times.  Denies any type of injury to area.  She took two old APAP w/codeine tabs that helped her sleep, but did not help pain

## 2014-12-21 ENCOUNTER — Other Ambulatory Visit: Payer: Self-pay | Admitting: Obstetrics and Gynecology

## 2014-12-24 ENCOUNTER — Other Ambulatory Visit: Payer: Self-pay | Admitting: Obstetrics and Gynecology

## 2014-12-24 ENCOUNTER — Encounter (HOSPITAL_COMMUNITY)
Admission: RE | Admit: 2014-12-24 | Discharge: 2014-12-24 | Disposition: A | Discharge status: Home / Self Care | Payer: BLUE CROSS/BLUE SHIELD | Source: Ambulatory Visit | Attending: Obstetrics and Gynecology | Admitting: Obstetrics and Gynecology

## 2014-12-24 ENCOUNTER — Encounter (HOSPITAL_COMMUNITY): Payer: Self-pay

## 2014-12-24 DIAGNOSIS — N926 Irregular menstruation, unspecified: Secondary | ICD-10-CM | POA: Diagnosis not present

## 2014-12-24 DIAGNOSIS — N920 Excessive and frequent menstruation with regular cycle: Secondary | ICD-10-CM | POA: Insufficient documentation

## 2014-12-24 DIAGNOSIS — Z01818 Encounter for other preprocedural examination: Secondary | ICD-10-CM | POA: Insufficient documentation

## 2014-12-24 HISTORY — DX: Cardiac arrhythmia, unspecified: I49.9

## 2014-12-24 HISTORY — DX: Adverse effect of unspecified anesthetic, initial encounter: T41.45XA

## 2014-12-24 HISTORY — DX: Insomnia, unspecified: G47.00

## 2014-12-24 HISTORY — DX: Anemia, unspecified: D64.9

## 2014-12-24 HISTORY — DX: Cramp and spasm: R25.2

## 2014-12-24 HISTORY — DX: Other complications of anesthesia, initial encounter: T88.59XA

## 2014-12-24 LAB — BASIC METABOLIC PANEL
ANION GAP: 9 (ref 5–15)
BUN: 19 mg/dL (ref 6–20)
CHLORIDE: 106 mmol/L (ref 101–111)
CO2: 22 mmol/L (ref 22–32)
Calcium: 9.3 mg/dL (ref 8.9–10.3)
Creatinine, Ser: 0.98 mg/dL (ref 0.44–1.00)
GFR calc Af Amer: >60 mL/min (ref >60)
GFR calc non Af Amer: >60 mL/min (ref >60)
Glucose, Bld: 121 mg/dL — ABNORMAL HIGH (ref 65–99)
POTASSIUM: 4.1 mmol/L (ref 3.5–5.1)
SODIUM: 137 mmol/L (ref 135–145)

## 2014-12-24 LAB — CBC
HCT: 37.2 % (ref 36.0–46.0)
HEMOGLOBIN: 11.9 g/dL — AB (ref 12.0–15.0)
MCH: 25.4 pg — AB (ref 26.0–34.0)
MCHC: 32 g/dL (ref 30.0–36.0)
MCV: 79.3 fL (ref 78.0–100.0)
Platelets: 436 10*3/uL — ABNORMAL HIGH (ref 150–400)
RBC: 4.69 MIL/uL (ref 3.87–5.11)
RDW: 16.4 % — ABNORMAL HIGH (ref 11.5–15.5)
WBC: 8.9 10*3/uL (ref 4.0–10.5)

## 2014-12-24 NOTE — Patient Instructions (Addendum)
Your procedure is scheduled on:  Friday, SEPT. 2, 2016  Enter through the Main Entrance of [facility] at: 11:00 a.m.  Pick up the phone at the desk and dial [phone].  Call this number if you have problems the morning of surgery: [phone].  Remember: Do NOT eat food: AFTER MIDNIGHT Thursday SEPT. 1, 2016 Do NOT drink clear liquids after: AFTER 8:30 A.M. DAY OF SURGERY Take these medicines the morning of surgery with a SIP OF WATER: NONE *TAKE HALF BEDTIME INSULIN DOSE SEPT 1, 2016  Do NOT wear jewelry (body piercing), metal hair clips/bobby pins, make-up, or nail polish. Do NOT wear lotions, powders, or perfumes.  You may wear deoderant. Do NOT shave for 48 hours prior to surgery. Do NOT bring valuables to the hospital. Contacts, dentures, or bridgework may not be worn into surgery. Have a responsible adult drive you home and stay with you for 24 hours after your procedure

## 2015-01-01 ENCOUNTER — Encounter (HOSPITAL_COMMUNITY): Payer: Self-pay | Admitting: Anesthesiology

## 2015-01-01 ENCOUNTER — Ambulatory Visit (HOSPITAL_COMMUNITY): Payer: BLUE CROSS/BLUE SHIELD | Admitting: Anesthesiology

## 2015-01-01 ENCOUNTER — Encounter (HOSPITAL_COMMUNITY)
Admission: RE | Disposition: A | Discharge status: Home / Self Care | Payer: Self-pay | Source: Ambulatory Visit | Attending: Obstetrics and Gynecology

## 2015-01-01 ENCOUNTER — Ambulatory Visit (HOSPITAL_COMMUNITY)
Admission: RE | Admit: 2015-01-01 | Discharge: 2015-01-01 | Disposition: A | Discharge status: Home / Self Care | Payer: BLUE CROSS/BLUE SHIELD | Source: Ambulatory Visit | Attending: Obstetrics and Gynecology | Admitting: Obstetrics and Gynecology

## 2015-01-01 DIAGNOSIS — E119 Type 2 diabetes mellitus without complications: Secondary | ICD-10-CM | POA: Diagnosis not present

## 2015-01-01 DIAGNOSIS — D573 Sickle-cell trait: Secondary | ICD-10-CM | POA: Insufficient documentation

## 2015-01-01 DIAGNOSIS — Z882 Allergy status to sulfonamides status: Secondary | ICD-10-CM | POA: Diagnosis not present

## 2015-01-01 DIAGNOSIS — E8881 Metabolic syndrome: Secondary | ICD-10-CM | POA: Insufficient documentation

## 2015-01-01 DIAGNOSIS — N925 Other specified irregular menstruation: Secondary | ICD-10-CM | POA: Diagnosis not present

## 2015-01-01 DIAGNOSIS — Z6833 Body mass index (BMI) 33.0-33.9, adult: Secondary | ICD-10-CM | POA: Insufficient documentation

## 2015-01-01 DIAGNOSIS — F419 Anxiety disorder, unspecified: Secondary | ICD-10-CM | POA: Insufficient documentation

## 2015-01-01 DIAGNOSIS — N920 Excessive and frequent menstruation with regular cycle: Secondary | ICD-10-CM | POA: Diagnosis present

## 2015-01-01 DIAGNOSIS — N946 Dysmenorrhea, unspecified: Secondary | ICD-10-CM | POA: Diagnosis not present

## 2015-01-01 DIAGNOSIS — E282 Polycystic ovarian syndrome: Secondary | ICD-10-CM | POA: Insufficient documentation

## 2015-01-01 DIAGNOSIS — M797 Fibromyalgia: Secondary | ICD-10-CM | POA: Insufficient documentation

## 2015-01-01 DIAGNOSIS — M419 Scoliosis, unspecified: Secondary | ICD-10-CM | POA: Diagnosis not present

## 2015-01-01 DIAGNOSIS — E669 Obesity, unspecified: Secondary | ICD-10-CM | POA: Insufficient documentation

## 2015-01-01 DIAGNOSIS — E780 Pure hypercholesterolemia: Secondary | ICD-10-CM | POA: Diagnosis not present

## 2015-01-01 DIAGNOSIS — Z91013 Allergy to seafood: Secondary | ICD-10-CM | POA: Diagnosis not present

## 2015-01-01 HISTORY — PX: DILATATION & CURETTAGE/HYSTEROSCOPY WITH MYOSURE: SHX6511

## 2015-01-01 LAB — GLUCOSE, CAPILLARY
Glucose-Capillary: 164 mg/dL — ABNORMAL HIGH (ref 65–99)
Glucose-Capillary: 145 mg/dL — ABNORMAL HIGH (ref 65–99)

## 2015-01-01 LAB — PREGNANCY, URINE: Preg Test, Ur: NEGATIVE

## 2015-01-01 SURGERY — DILATATION & CURETTAGE/HYSTEROSCOPY WITH MYOSURE
Anesthesia: General | Site: Vagina

## 2015-01-01 MED ORDER — ONDANSETRON HCL 4 MG/2ML IJ SOLN
INTRAMUSCULAR | Status: AC
  Filled 2015-01-01: qty 2

## 2015-01-01 MED ORDER — SCOPOLAMINE 1 MG/3DAYS TD PT72
1.0000 | MEDICATED_PATCH | Freq: Once | TRANSDERMAL | Status: DC
  Administered 2015-01-01: 1.5 mg via TRANSDERMAL

## 2015-01-01 MED ORDER — DEXAMETHASONE SODIUM PHOSPHATE 4 MG/ML IJ SOLN
INTRAMUSCULAR | Status: AC
  Filled 2015-01-01: qty 1

## 2015-01-01 MED ORDER — LIDOCAINE HCL 2 % IJ SOLN
INTRAMUSCULAR | Status: AC
  Filled 2015-01-01: qty 20

## 2015-01-01 MED ORDER — LIDOCAINE HCL (CARDIAC) 20 MG/ML IV SOLN
INTRAVENOUS | Status: AC
  Filled 2015-01-01: qty 5

## 2015-01-01 MED ORDER — PROPOFOL 10 MG/ML IV BOLUS
INTRAVENOUS | Status: AC
  Filled 2015-01-01: qty 20

## 2015-01-01 MED ORDER — LIDOCAINE HCL (PF) 2 % IJ SOLN
INTRAMUSCULAR | Status: DC | PRN
Start: 2015-01-01 — End: 2015-01-01
  Administered 2015-01-01: 20 mL via INTRADERMAL

## 2015-01-01 MED ORDER — ONDANSETRON HCL 4 MG/2ML IJ SOLN
INTRAMUSCULAR | Status: DC | PRN
  Administered 2015-01-01: 4 mg via INTRAVENOUS

## 2015-01-01 MED ORDER — KETOROLAC TROMETHAMINE 30 MG/ML IJ SOLN
INTRAMUSCULAR | Status: AC
  Filled 2015-01-01: qty 1

## 2015-01-01 MED ORDER — KETOROLAC TROMETHAMINE 30 MG/ML IJ SOLN
INTRAMUSCULAR | Status: DC | PRN
  Administered 2015-01-01: 30 mg via INTRAVENOUS

## 2015-01-01 MED ORDER — OXYCODONE HCL 5 MG PO TABS: 5.0000 mg | ORAL_TABLET | Freq: Once | ORAL | Status: DC | PRN

## 2015-01-01 MED ORDER — FENTANYL CITRATE (PF) 100 MCG/2ML IJ SOLN
INTRAMUSCULAR | Status: AC
  Filled 2015-01-01: qty 2

## 2015-01-01 MED ORDER — DOXYCYCLINE HYCLATE 50 MG PO CAPS: 100.0000 mg | ORAL_CAPSULE | Freq: Two times a day (BID) | ORAL | Status: DC

## 2015-01-01 MED ORDER — DEXAMETHASONE SODIUM PHOSPHATE 10 MG/ML IJ SOLN
INTRAMUSCULAR | Status: DC | PRN
  Administered 2015-01-01: 4 mg via INTRAVENOUS

## 2015-01-01 MED ORDER — MEPERIDINE HCL 25 MG/ML IJ SOLN: 6.2500 mg | INTRAMUSCULAR | Status: DC | PRN

## 2015-01-01 MED ORDER — FENTANYL CITRATE (PF) 100 MCG/2ML IJ SOLN
25.0000 ug | INTRAMUSCULAR | Status: DC | PRN
  Administered 2015-01-01 (×2): 50 ug via INTRAVENOUS

## 2015-01-01 MED ORDER — MIDAZOLAM HCL 2 MG/2ML IJ SOLN
INTRAMUSCULAR | Status: AC
  Filled 2015-01-01: qty 4

## 2015-01-01 MED ORDER — HYDROCODONE-ACETAMINOPHEN 5-325 MG PO TABS
1.0000 | ORAL_TABLET | Freq: Four times a day (QID) | ORAL | Status: DC | PRN
Start: 2015-01-01 — End: 2015-09-29

## 2015-01-01 MED ORDER — LACTATED RINGERS IR SOLN
Status: DC | PRN
  Administered 2015-01-01: 3000 mL

## 2015-01-01 MED ORDER — OXYCODONE HCL 5 MG/5ML PO SOLN: 5.0000 mg | Freq: Once | ORAL | Status: DC | PRN

## 2015-01-01 MED ORDER — FENTANYL CITRATE (PF) 100 MCG/2ML IJ SOLN
INTRAMUSCULAR | Status: AC
  Filled 2015-01-01: qty 4

## 2015-01-01 MED ORDER — FENTANYL CITRATE (PF) 100 MCG/2ML IJ SOLN
INTRAMUSCULAR | Status: DC | PRN
  Administered 2015-01-01: 100 ug via INTRAVENOUS

## 2015-01-01 MED ORDER — PROPOFOL 10 MG/ML IV BOLUS
INTRAVENOUS | Status: DC | PRN
  Administered 2015-01-01: 200 mg via INTRAVENOUS

## 2015-01-01 MED ORDER — NORGESTIM-ETH ESTRAD TRIPHASIC 0.18/0.215/0.25 MG-35 MCG PO TABS: 1.0000 | ORAL_TABLET | Freq: Every day | ORAL | Status: DC

## 2015-01-01 MED ORDER — LIDOCAINE HCL (CARDIAC) 20 MG/ML IV SOLN
INTRAVENOUS | Status: DC | PRN
  Administered 2015-01-01: 50 mg via INTRAVENOUS

## 2015-01-01 MED ORDER — LACTATED RINGERS IV SOLN
INTRAVENOUS | Status: DC
  Administered 2015-01-01: 11:00:00 via INTRAVENOUS

## 2015-01-01 MED ORDER — SCOPOLAMINE 1 MG/3DAYS TD PT72
MEDICATED_PATCH | TRANSDERMAL | Status: AC
  Filled 2015-01-01: qty 1

## 2015-01-01 MED ORDER — MIDAZOLAM HCL 2 MG/2ML IJ SOLN
INTRAMUSCULAR | Status: DC | PRN
  Administered 2015-01-01: 2 mg via INTRAVENOUS

## 2015-01-01 MED ORDER — KETOROLAC TROMETHAMINE 30 MG/ML IJ SOLN: 30.0000 mg | Freq: Once | INTRAMUSCULAR | Status: DC | PRN

## 2015-01-01 MED ORDER — IBUPROFEN 600 MG PO TABS: 600.0000 mg | ORAL_TABLET | Freq: Four times a day (QID) | ORAL | Status: DC | PRN

## 2015-01-01 MED ORDER — ONDANSETRON HCL 4 MG/2ML IJ SOLN: 4.0000 mg | Freq: Once | INTRAMUSCULAR | Status: DC | PRN

## 2015-01-01 MED ORDER — MIDAZOLAM HCL 2 MG/2ML IJ SOLN: 0.5000 mg | Freq: Once | INTRAMUSCULAR | Status: DC

## 2015-01-01 SURGICAL SUPPLY — 21 items
CANISTER SUCT 3000ML (MISCELLANEOUS) ×3 IMPLANT
CATH ROBINSON RED A/P 16FR (CATHETERS) ×3 IMPLANT
CLOTH BEACON ORANGE TIMEOUT ST (SAFETY) ×3 IMPLANT
CONTAINER PREFILL 10% NBF 60ML (FORM) ×6 IMPLANT
DEVICE MYOSURE CLASSIC (MISCELLANEOUS) IMPLANT
DEVICE MYOSURE LITE (MISCELLANEOUS) IMPLANT
DILATOR CANAL MILEX (MISCELLANEOUS) IMPLANT
ELECT REM PT RETURN 9FT ADLT (ELECTROSURGICAL) ×3
ELECTRODE REM PT RTRN 9FT ADLT (ELECTROSURGICAL) ×1 IMPLANT
FILTER ARTHROSCOPY CONVERTOR (FILTER) ×3 IMPLANT
GLOVE BIO SURGEON STRL SZ 6.5 (GLOVE) ×4 IMPLANT
GLOVE BIO SURGEONS STRL SZ 6.5 (GLOVE) ×2
GLOVE BIOGEL PI IND STRL 7.0 (GLOVE) ×2 IMPLANT
GLOVE BIOGEL PI INDICATOR 7.0 (GLOVE) ×4
GOWN STRL REUS W/TWL LRG LVL3 (GOWN DISPOSABLE) ×6 IMPLANT
PACK VAGINAL MINOR WOMEN LF (CUSTOM PROCEDURE TRAY) ×3 IMPLANT
PAD OB MATERNITY 4.3X12.25 (PERSONAL CARE ITEMS) ×3 IMPLANT
SEAL ROD LENS SCOPE MYOSURE (ABLATOR) ×3 IMPLANT
TOWEL OR 17X24 6PK STRL BLUE (TOWEL DISPOSABLE) ×6 IMPLANT
TUBING AQUILEX INFLOW (TUBING) ×3 IMPLANT
TUBING AQUILEX OUTFLOW (TUBING) ×3 IMPLANT

## 2015-01-01 NOTE — Discharge Instructions (Signed)
DISCHARGE INSTRUCTIONS: D&C / D&E The following instructions have been prepared to help you care for yourself upon your return home.  MAY TAKE IBUPROFEN (MOTRIN, ADVIL) OR ALEVE AFTER 6:30 PM FOR PAIN!! MAY TAKE THE PATCH BEHIND YOUR EAR OFF ON 01/03/15.  [facility] HANDS AFTER REMOVAL!!   Personal hygiene:  Use sanitary pads for vaginal drainage, not tampons.  Shower the day after your procedure.  NO tub baths, pools or Jacuzzis for 2-3 weeks.  Wipe front to back after using the bathroom.  Activity and limitations:  Do NOT drive or operate any equipment for 24 hours. The effects of anesthesia are still present and drowsiness may result.  Do NOT rest in bed all day.  Walking is encouraged.  Walk up and down stairs slowly.  You may resume your normal activity in one to two days or as indicated by your physician.  Sexual activity: NO intercourse for at least 2 weeks after the procedure, or as indicated by your physician.  Diet: Eat a light meal as desired this evening. You may resume your usual diet tomorrow.  Return to work: You may resume your work activities in one to two days or as indicated by your doctor.  What to expect after your surgery: Expect to have vaginal bleeding/discharge for 2-3 days and spotting for up to 10 days. It is not unusual to have soreness for up to 1-2 weeks. You may have a slight burning sensation when you urinate for the first day. Mild cramps may continue for a couple of days. You may have a regular period in 2-6 weeks.  Call your doctor for any of the following:  Excessive vaginal bleeding, saturating and changing one pad every hour.  Inability to urinate 6 hours after discharge from hospital.  Pain not relieved by pain medication.  Fever of 100.4 F or greater.  Unusual vaginal discharge or odor.   Call for an appointment:    Patients signature: ______________________  Nurses signature ________________________  Support person's  signature_______________________

## 2015-01-01 NOTE — Op Note (Signed)
Pre op DX: Irregular Periods, Menorrhagia   Post Op BP:ZWCH   PHYSICIAN : Kensli Bowley   ASSISTANTS: none   ANESTHESIA:   General LMA and paracervical block  ESTIMATED BLOOD LOSS: minimal  LOCAL MEDICATIONS USED:  LIDOCAINE 20CC  SPECIMEN:  Source of Specimen:  endometrial curettings  DISPOSITION OF SPECIMEN:  PATHOLOGY  COUNTS Correct:  YES    DICTATION #: The patient was taken to the operating room and prepped and draped in a normal sterile fashion. An in out catheter was used to drain the bladder.   A bivalve speculum was placed into the vagina and anterior lip of the cervix was grasped with a single-tooth tenaculum.  20 cc of 2% lidocaine was used for cervical block.  the cervix was then dilated with Kennon Rounds dilators up to 21. The hysteroscope was placed into the uterine cavity. The  entire uterus and both ostia were visualized.   Hyseroscope was then removed from the uterus. A sharp curettage was then done with a curette and endometrial curettings were obtained. The endometrial curettings were sent to pathology. Again the hysteroscope was placed into the uterine cavity. Both ostia were again visualized there were no polyps or submucosal fibroids or endometrial masses seen. The tenaculum was removed from the cervix and hemostasis was noted.   PLAN OF CARE: discharge to home  PATIENT DISPOSITION:  PACU - hemodynamically stable.

## 2015-01-01 NOTE — H&P (Signed)
Karen Coaster Villanueva-Rowe44 y.o. female. Who presents with heavy and irreg vaginal bleeding for the last 6 months.  .  She has uses 1-2 pads/ tampons every hour while menstruating.  She denies any CP or SOB.  Nothing makes it better.  Nothing makes it worse.  She soes have dysmenorrhea.  Pt has tried OCPS and provera  without success. Pertinent Gynecological History: Contraception: Education given regarding options for contraception, including oral contraceptives. Blood transfusions: none Sexually transmitted diseases: na Previous GYN Procedures: none Last mammogram:  Last pap: normal Date: WNL 2016 OB History: G1P!   Menstrual History: Menarche age: 56  Patient's last menstrual period was 11/28/2014.    Past Medical History  Diagnosis Date  . Arthritis   . Degenerative disc disease   . Degenerative disc disease   . Sickle cell trait   . DDD (degenerative disc disease)   . Anxiety   . Endometriosis   . Scoliosis   . DDD (degenerative disc disease)   . PCOS (polycystic ovarian syndrome)   . Fibroids   . High cholesterol   . Fibromyalgia   . Dysrhythmia     stress and caffeine related  . Leg cramps   . Insomnia   . Diabetes mellitus     Type 2 insulin resistant  . Anemia   . Complication of anesthesia     anesthesia awareness,   Past Surgical History  Procedure Laterality Date  . Cesarean section    . Laparoscopy  2002 or 2003  . Wisdom tooth extraction    . Toe surgery  2008  . Biopsy breast    . Back surgery  2010    Current facility-administered medications:  .  lactated ringers infusion, , Intravenous, Continuous, Catalina Gravel, MD, Last Rate: 125 mL/hr at 01/01/15 1112 .  scopolamine (TRANSDERM-SCOP) 1 MG/3DAYS 1.5 mg, 1 patch, Transdermal, Once, Catalina Gravel, MD, 1.5 mg at 01/01/15 1113 .  scopolamine (TRANSDERM-SCOP) 1 MG/3DAYS, , , ,  Allergies  Allergen Reactions  . Shellfish Allergy Swelling    Itching of lips and ears, NOT allergic to iodine  contrast  . Sulfa Antibiotics Other (See Comments)    Dizzy and sees spots with sulfa drugs   Review of Systems - Negative except  for above   Physical Exam  BP 117/65 mmHg  Pulse 98  Temp(Src) 98.2 F (36.8 C) (Oral)  Resp 16  SpO2 100%  LMP 11/28/2014 Constitutional: She appears well-developed and well-nourished.  HENT:  Head: Normocephalic.  Eyes: Pupils are equal, round, and reactive to light.  Neck: Normal range of motion. Neck supple.  Cardiovascular: Regular rhythm.   Respiratory: Effort normal and breath sounds normal.  GI: Soft.  Genitourinary:vulvua vaginal WNL. Uterus normal size nt Musculoskeletal: Normal range of motion.  Neurological: She is alert.  Skin: Skin is warm.  Psychiatric: She has a normal mood and affect.  Results for orders placed or performed during the hospital encounter of 01/01/15 (from the past 72 hour(s))  Pregnancy, urine     Status: None   Collection Time: 01/01/15 10:50 AM  Result Value Ref Range   Preg Test, Ur NEGATIVE NEGATIVE    Comment:        THE SENSITIVITY OF THIS METHODOLOGY IS >20 mIU/mL.   Glucose, capillary     Status: Abnormal   Collection Time: 01/01/15 11:04 AM  Result Value Ref Range   Glucose-Capillary 164 (H) 65 - 99 mg/dL   [place] width7  Length4 OvariesWNL  Assessment/Plan: Menometrorrhagia with endometrial polyp Pt offered  obs vs surgery.  Pt chose surgery.  Plan D&C hysteroscopy polypectomy.  Risks are but not limited to bleeding, infection, scarring of the uterus and perforation.     [place] A 03/20/2011, 11:41 AM

## 2015-01-01 NOTE — Anesthesia Preprocedure Evaluation (Signed)
Anesthesia Evaluation  Patient identified by MRN, date of birth, ID band Patient awake    Reviewed: Allergy & Precautions, H&P , NPO status , Patient's Chart, lab work & pertinent test results  History of Anesthesia Complications (+) AWARENESS UNDER ANESTHESIA and history of anesthetic complications  Airway Mallampati: II  TM Distance: >3 FB Neck ROM: full    Dental no notable dental hx. (+) Teeth Intact   Pulmonary  breath sounds clear to auscultation  Pulmonary exam normal       Cardiovascular negative cardio ROS Normal cardiovascular exam    Neuro/Psych    GI/Hepatic negative GI ROS, Neg liver ROS,   Endo/Other  diabetes, Type 2  Renal/GU negative Renal ROS     Musculoskeletal   Abdominal (+) + obese,   Peds  Hematology   Anesthesia Other Findings   Reproductive/Obstetrics negative OB ROS                             Anesthesia Physical Anesthesia Plan  ASA: II  Anesthesia Plan: General   Post-op Pain Management:    Induction: Intravenous  Airway Management Planned: LMA  Additional Equipment:   Intra-op Plan:   Post-operative Plan:   Informed Consent: I have reviewed the patients History and Physical, chart, labs and discussed the procedure including the risks, benefits and alternatives for the proposed anesthesia with the patient or authorized representative who has indicated his/her understanding and acceptance.   Dental Advisory Given and History available from chart only  Plan Discussed with: CRNA, Anesthesiologist and Surgeon  Anesthesia Plan Comments: (The episode of awareness was during an emergency csection at [facility]. I reassured her that her experience during the csection will not translate over to her surgery today in that her procedure is not emergent and we now have BIS monitoring for awareness.)        Anesthesia Quick Evaluation

## 2015-01-01 NOTE — Transfer of Care (Signed)
Immediate Anesthesia Transfer of Care Note  Patient: Karen Stein  Procedure(s) Performed: Procedure(s): DILATATION & CURETTAGE/HYSTEROSCOPY WITH MYOSURE (N/A)  Patient Location: PACU  Anesthesia Type:General  Level of Consciousness: awake, alert  and oriented  Airway & Oxygen Therapy: Patient Spontanous Breathing and Patient connected to nasal cannula oxygen  Post-op Assessment: Report given to RN  Post vital signs: Reviewed  Last Vitals:  Filed Vitals:   01/01/15 1107  BP: 117/65  Pulse: 98  Temp: 36.8 C  Resp: 16    Complications: No apparent anesthesia complications

## 2015-01-01 NOTE — Anesthesia Procedure Notes (Signed)
Procedure Name: LMA Insertion Date/Time: 01/01/2015 12:04 PM Performed by: Jonna Munro Pre-anesthesia Checklist: Patient identified, Emergency Drugs available, Suction available, Patient being monitored and Timeout performed Patient Re-evaluated:Patient Re-evaluated prior to inductionOxygen Delivery Method: Circle system utilized Preoxygenation: Pre-oxygenation with 100% oxygen Intubation Type: IV induction LMA: LMA inserted LMA Size: 4.0 Number of attempts: 1 Dental Injury: Teeth and Oropharynx as per pre-operative assessment

## 2015-01-03 NOTE — Anesthesia Postprocedure Evaluation (Signed)
  Anesthesia Post-op Note  Patient: Karen Stein  Procedure(s) Performed: Procedure(s) (LRB): DILATATION & CURETTAGE/HYSTEROSCOPY WITH MYOSURE (N/A)  Patient Location: PACU  Anesthesia Type: General  Level of Consciousness: awake and alert   Airway and Oxygen Therapy: Patient Spontanous Breathing  Post-op Pain: mild  Post-op Assessment: Post-op Vital signs reviewed, Patient's Cardiovascular Status Stable, Respiratory Function Stable, Patent Airway and No signs of Nausea or vomiting  Last Vitals:  Filed Vitals:   01/01/15 1330  BP: 137/79  Pulse: 94  Temp: 36.7 C  Resp: 17    Post-op Vital Signs: stable   Complications: No apparent anesthesia complications

## 2015-01-05 ENCOUNTER — Encounter (HOSPITAL_COMMUNITY): Payer: Self-pay | Admitting: Obstetrics and Gynecology

## 2015-02-17 ENCOUNTER — Encounter (HOSPITAL_BASED_OUTPATIENT_CLINIC_OR_DEPARTMENT_OTHER): Payer: Self-pay

## 2015-02-17 ENCOUNTER — Emergency Department (HOSPITAL_BASED_OUTPATIENT_CLINIC_OR_DEPARTMENT_OTHER)
Admission: EM | Admit: 2015-02-17 | Discharge: 2015-02-17 | Disposition: A | Discharge status: Home / Self Care | Payer: BLUE CROSS/BLUE SHIELD | Attending: Emergency Medicine | Admitting: Emergency Medicine

## 2015-02-17 DIAGNOSIS — M419 Scoliosis, unspecified: Secondary | ICD-10-CM | POA: Insufficient documentation

## 2015-02-17 DIAGNOSIS — Z8679 Personal history of other diseases of the circulatory system: Secondary | ICD-10-CM | POA: Insufficient documentation

## 2015-02-17 DIAGNOSIS — E1165 Type 2 diabetes mellitus with hyperglycemia: Secondary | ICD-10-CM | POA: Insufficient documentation

## 2015-02-17 DIAGNOSIS — Z794 Long term (current) use of insulin: Secondary | ICD-10-CM | POA: Insufficient documentation

## 2015-02-17 DIAGNOSIS — E7800 Pure hypercholesterolemia, unspecified: Secondary | ICD-10-CM | POA: Diagnosis not present

## 2015-02-17 DIAGNOSIS — R739 Hyperglycemia, unspecified: Secondary | ICD-10-CM

## 2015-02-17 DIAGNOSIS — M1990 Unspecified osteoarthritis, unspecified site: Secondary | ICD-10-CM | POA: Diagnosis not present

## 2015-02-17 DIAGNOSIS — Z8742 Personal history of other diseases of the female genital tract: Secondary | ICD-10-CM | POA: Diagnosis not present

## 2015-02-17 DIAGNOSIS — Z3202 Encounter for pregnancy test, result negative: Secondary | ICD-10-CM | POA: Diagnosis not present

## 2015-02-17 DIAGNOSIS — R42 Dizziness and giddiness: Secondary | ICD-10-CM | POA: Insufficient documentation

## 2015-02-17 DIAGNOSIS — Z8659 Personal history of other mental and behavioral disorders: Secondary | ICD-10-CM | POA: Insufficient documentation

## 2015-02-17 DIAGNOSIS — Z86018 Personal history of other benign neoplasm: Secondary | ICD-10-CM | POA: Diagnosis not present

## 2015-02-17 DIAGNOSIS — Z8669 Personal history of other diseases of the nervous system and sense organs: Secondary | ICD-10-CM | POA: Insufficient documentation

## 2015-02-17 DIAGNOSIS — M797 Fibromyalgia: Secondary | ICD-10-CM | POA: Diagnosis not present

## 2015-02-17 DIAGNOSIS — Z79899 Other long term (current) drug therapy: Secondary | ICD-10-CM | POA: Diagnosis not present

## 2015-02-17 DIAGNOSIS — IMO0001 Reserved for inherently not codable concepts without codable children: Secondary | ICD-10-CM

## 2015-02-17 DIAGNOSIS — Z862 Personal history of diseases of the blood and blood-forming organs and certain disorders involving the immune mechanism: Secondary | ICD-10-CM | POA: Insufficient documentation

## 2015-02-17 DIAGNOSIS — K219 Gastro-esophageal reflux disease without esophagitis: Secondary | ICD-10-CM | POA: Diagnosis not present

## 2015-02-17 HISTORY — DX: Gastro-esophageal reflux disease without esophagitis: K21.9

## 2015-02-17 LAB — CBG MONITORING, ED
GLUCOSE-CAPILLARY: 485 mg/dL — AB (ref 65–99)
GLUCOSE-CAPILLARY: 306 mg/dL — AB (ref 65–99)
Glucose-Capillary: 421 mg/dL — ABNORMAL HIGH (ref 65–99)
Glucose-Capillary: 372 mg/dL — ABNORMAL HIGH (ref 65–99)

## 2015-02-17 LAB — URINALYSIS, ROUTINE W REFLEX MICROSCOPIC
Bilirubin Urine: NEGATIVE
HGB URINE DIPSTICK: NEGATIVE
KETONES UR: NEGATIVE mg/dL
LEUKOCYTES UA: NEGATIVE
Nitrite: NEGATIVE
PH: 5.5 (ref 5.0–8.0)
PROTEIN: NEGATIVE mg/dL
Specific Gravity, Urine: 1.025 (ref 1.005–1.030)
Urobilinogen, UA: 0.2 mg/dL (ref 0.0–1.0)

## 2015-02-17 LAB — CBC WITH DIFFERENTIAL/PLATELET
BASOS PCT: 1 %
Basophils Absolute: 0 10*3/uL (ref 0.0–0.1)
EOS ABS: 0.2 10*3/uL (ref 0.0–0.7)
Eosinophils Relative: 2 %
HCT: 36.8 % (ref 36.0–46.0)
HEMOGLOBIN: 11.9 g/dL — AB (ref 12.0–15.0)
Lymphocytes Relative: 35 %
Lymphs Abs: 2.3 10*3/uL (ref 0.7–4.0)
MCH: 25.5 pg — ABNORMAL LOW (ref 26.0–34.0)
MCHC: 32.3 g/dL (ref 30.0–36.0)
MCV: 79 fL (ref 78.0–100.0)
MONOS PCT: 6 %
Monocytes Absolute: 0.4 10*3/uL (ref 0.1–1.0)
NEUTROS PCT: 56 %
Neutro Abs: 3.7 10*3/uL (ref 1.7–7.7)
PLATELETS: 407 10*3/uL — AB (ref 150–400)
RBC: 4.66 MIL/uL (ref 3.87–5.11)
RDW: 16.1 % — ABNORMAL HIGH (ref 11.5–15.5)
WBC: 6.5 10*3/uL (ref 4.0–10.5)

## 2015-02-17 LAB — URINE MICROSCOPIC-ADD ON

## 2015-02-17 LAB — BASIC METABOLIC PANEL
Anion gap: 7 (ref 5–15)
BUN: 16 mg/dL (ref 6–20)
CALCIUM: 8.6 mg/dL — AB (ref 8.9–10.3)
CO2: 23 mmol/L (ref 22–32)
CREATININE: 0.95 mg/dL (ref 0.44–1.00)
Chloride: 100 mmol/L — ABNORMAL LOW (ref 101–111)
GFR calc non Af Amer: >60 mL/min (ref >60)
Glucose, Bld: 495 mg/dL — ABNORMAL HIGH (ref 65–99)
Potassium: 3.9 mmol/L (ref 3.5–5.1)
SODIUM: 130 mmol/L — AB (ref 135–145)

## 2015-02-17 LAB — TROPONIN I: Troponin I: <0.03 ng/mL (ref <0.031)

## 2015-02-17 LAB — PREGNANCY, URINE: Preg Test, Ur: NEGATIVE

## 2015-02-17 MED ORDER — INSULIN ASPART 100 UNIT/ML ~~LOC~~ SOLN
8.0000 [IU] | Freq: Once | SUBCUTANEOUS | Status: AC
  Administered 2015-02-17: 8 [IU] via SUBCUTANEOUS
  Filled 2015-02-17: qty 1

## 2015-02-17 MED ORDER — KETOROLAC TROMETHAMINE 30 MG/ML IJ SOLN
30.0000 mg | Freq: Once | INTRAMUSCULAR | Status: AC
  Administered 2015-02-17: 30 mg via INTRAVENOUS
  Filled 2015-02-17: qty 1

## 2015-02-17 MED ORDER — SODIUM CHLORIDE 0.9 % IV BOLUS (SEPSIS)
1000.0000 mL | Freq: Once | INTRAVENOUS | Status: AC
  Administered 2015-02-17: 1000 mL via INTRAVENOUS

## 2015-02-17 MED ORDER — ONDANSETRON HCL 4 MG/2ML IJ SOLN
4.0000 mg | Freq: Once | INTRAMUSCULAR | Status: AC
  Administered 2015-02-17: 4 mg via INTRAVENOUS
  Filled 2015-02-17: qty 2

## 2015-02-17 MED ORDER — GI COCKTAIL ~~LOC~~
30.0000 mL | Freq: Once | ORAL | Status: AC
  Administered 2015-02-17: 30 mL via ORAL
  Filled 2015-02-17: qty 30

## 2015-02-17 MED ORDER — RABEPRAZOLE SODIUM 20 MG PO TBEC: 20.0000 mg | DELAYED_RELEASE_TABLET | Freq: Every day | ORAL | Status: DC

## 2015-02-17 NOTE — ED Notes (Signed)
CBG - 372, RN notified.

## 2015-02-17 NOTE — ED Notes (Signed)
Pt reports fatigue, dizziness and pain in L lower posterior rib area, worse with deep inspiration.  BG at home would not read on her home machine.

## 2015-02-17 NOTE — Discharge Instructions (Signed)
You need to follow up as soon as possible with your PCP for medication adjustment. You may need a referral to endocrinologist. . Hyperglycemia Hyperglycemia occurs when the glucose (sugar) in your blood is too high. Hyperglycemia can happen for many reasons, but it most often happens to people who do not know they have diabetes or are not managing their diabetes properly.  CAUSES  Whether you have diabetes or not, there are other causes of hyperglycemia. Hyperglycemia can occur when you have diabetes, but it can also occur in other situations that you might not be as aware of, such as: Diabetes  If you have diabetes and are having problems controlling your blood glucose, hyperglycemia could occur because of some of the following reasons:  Not following your meal plan.  Not taking your diabetes medications or not taking it properly.  Exercising less or doing less activity than you normally do.  Being sick. Pre-diabetes  This cannot be ignored. Before people develop Type 2 diabetes, they almost always have "pre-diabetes." This is when your blood glucose levels are higher than normal, but not yet high enough to be diagnosed as diabetes. Research has shown that some long-term damage to the body, especially the heart and circulatory system, may already be occurring during pre-diabetes. If you take action to manage your blood glucose when you have pre-diabetes, you may delay or prevent Type 2 diabetes from developing. Stress  If you have diabetes, you may be "diet" controlled or on oral medications or insulin to control your diabetes. However, you may find that your blood glucose is higher than usual in the hospital whether you have diabetes or not. This is often referred to as "stress hyperglycemia." Stress can elevate your blood glucose. This happens because of hormones put out by the body during times of stress. If stress has been the cause of your high blood glucose, it can be followed regularly  by your caregiver. That way he/she can make sure your hyperglycemia does not continue to get worse or progress to diabetes. Steroids  Steroids are medications that act on the infection fighting system (immune system) to block inflammation or infection. One side effect can be a rise in blood glucose. Most people can produce enough extra insulin to allow for this rise, but for those who cannot, steroids make blood glucose levels go even higher. It is not unusual for steroid treatments to "uncover" diabetes that is developing. It is not always possible to determine if the hyperglycemia will go away after the steroids are stopped. A special blood test called an A1c is sometimes done to determine if your blood glucose was elevated before the steroids were started. SYMPTOMS  Thirsty.  Frequent urination.  Dry mouth.  Blurred vision.  Tired or fatigue.  Weakness.  Sleepy.  Tingling in feet or leg. DIAGNOSIS  Diagnosis is made by monitoring blood glucose in one or all of the following ways:  A1c test. This is a chemical found in your blood.  Fingerstick blood glucose monitoring.  Laboratory results. TREATMENT  First, knowing the cause of the hyperglycemia is important before the hyperglycemia can be treated. Treatment may include, but is not be limited to:  Education.  Change or adjustment in medications.  Change or adjustment in meal plan.  Treatment for an illness, infection, etc.  More frequent blood glucose monitoring.  Change in exercise plan.  Decreasing or stopping steroids.  Lifestyle changes. HOME CARE INSTRUCTIONS   Test your blood glucose as directed.  Exercise regularly.  Your caregiver will give you instructions about exercise. Pre-diabetes or diabetes which comes on with stress is helped by exercising.  Eat wholesome, balanced meals. Eat often and at regular, fixed times. Your caregiver or nutritionist will give you a meal plan to guide your sugar  intake.  Being at an ideal weight is important. If needed, losing as little as 10 to 15 pounds may help improve blood glucose levels. SEEK MEDICAL CARE IF:   You have questions about medicine, activity, or diet.  You continue to have symptoms (problems such as increased thirst, urination, or weight gain). SEEK IMMEDIATE MEDICAL CARE IF:   You are vomiting or have diarrhea.  Your breath smells fruity.  You are breathing faster or slower.  You are very sleepy or incoherent.  You have numbness, tingling, or pain in your feet or hands.  You have chest pain.  Your symptoms get worse even though you have been following your caregiver's orders.  If you have any other questions or concerns.   This information is not intended to replace advice given to you by your health care provider. Make sure you discuss any questions you have with your health care provider.   Document Released: 10/11/2000 Document Revised: 07/10/2011 Document Reviewed: 12/22/2014 Elsevier Interactive Patient Education 2016 [place] is a feeling of pain, discomfort, burning, or fullness in the upper part of your abdomen. It can come and go. It may occur frequently or rarely. Indigestion tends to occur while you are eating or right after you have finished eating. It may be worse at night and while bending over or lying down. HOME CARE INSTRUCTIONS Take these actions to decrease your pain or discomfort and to help avoid complications. Diet  Follow a diet as recommended by your health care provider. This may involve avoiding foods and drinks such as:  Coffee and tea (with or without caffeine).  Drinks that contain alcohol.  Energy drinks and sports drinks.  Carbonated drinks or sodas.  Chocolate and cocoa.  Peppermint and mint flavorings.  Garlic and onions.  Horseradish.  Spicy and acidic foods, including peppers, chili powder, curry powder, vinegar, hot sauces, and  barbecue sauce.  Citrus fruit juices and citrus fruits, such as oranges, lemons, and limes.  Tomato-based foods, such as red sauce, chili, salsa, and pizza with red sauce.  Fried and fatty foods, such as donuts, french fries, potato chips, and high-fat dressings.  High-fat meats, such as hot dogs and fatty cuts of red and white meats, such as rib eye steak, sausage, ham, and bacon.  High-fat dairy items, such as whole milk, butter, and cream cheese.  Eat small, frequent meals instead of large meals.  Avoid drinking large amounts of liquid with your meals.  Avoid eating meals during the 2-3 hours before bedtime.  Avoid lying down right after you eat.  Do not exercise right after you eat. General Instructions  Pay attention to any changes in your symptoms.  Take over-the-counter and prescription medicines only as told by your health care provider. Do not take aspirin, ibuprofen, or other NSAIDs unless your health care provider told you to do so.  Do not use any tobacco products, including cigarettes, chewing tobacco, and e-cigarettes. If you need help quitting, ask your health care provider.  Wear loose-fitting clothing. Do not wear anything tight around your waist that causes pressure on your abdomen.  Raise (elevate) the head of your bed about 6 inches (15 cm).  Try to reduce your  stress, such as with yoga or meditation. If you need help reducing stress, ask your health care provider.  If you are overweight, reduce your weight to an amount that is healthy for you. Ask your health care provider for guidance about a safe weight loss goal.  Keep all follow-up visits as told by your health care provider. This is important. SEEK MEDICAL CARE IF:  You have new symptoms.  You have unexplained weight loss.  You have difficulty swallowing, or it hurts to swallow.  Your symptoms do not improve with treatment.  Your symptoms last for more than two days.  You have a  fever.  You vomit. SEEK IMMEDIATE MEDICAL CARE IF:  You have pain in your arms, neck, jaw, teeth, or back.  You feel sweaty, dizzy, or light-headed.  You faint.  You have chest pain or shortness of breath.  You cannot stop vomiting, or you vomit blood.  Your stool is bloody or black.  You have severe pain in your abdomen.   This information is not intended to replace advice given to you by your health care provider. Make sure you discuss any questions you have with your health care provider.   Document Released: 05/25/2004 Document Revised: 01/06/2015 Document Reviewed: 08/12/2014 Elsevier Interactive Patient Education [employer].

## 2015-02-17 NOTE — ED Notes (Signed)
CBG - 421, RN notified.

## 2015-02-17 NOTE — ED Provider Notes (Signed)
CSN: [id number]     Arrival date & time 02/17/15  1142 History   First MD Initiated Contact with Patient 02/17/15 1204     Chief Complaint  Patient presents with  . Hyperglycemia  . Dizziness     (Consider location/radiation/quality/duration/timing/severity/associated sxs/prior Treatment) HPI  Karen Stein Is a 44 year old female with a past medical history of insulin resistant type 2 diabetes, hypercholesterolemia, chronic pain, fibromyalgia who presents emergency Department with chief complaint of hyperglycemia. Patient states that her blood sugars have been running very high. She states that she has been under significant emotional stress due to multiple social factors and grieving. The patient states that last night she checked her meter and her blood sugar was almost 500. This morning when she woke up and checked her sugar, her meter read high. The patient states that she's been taking her medications as directed. She denies any systemic symptoms of infection including fevers, chills, myalgias other than what she normally has, she denies urinary symptoms, abdominal pain, diarrhea or fevers. The patient denies cough. She is complaining of reflux and states that she doesn't generally have been and did not take anything for it today. She has some associated nausea, she denies chest pain, shortness of breath, left jaw or arm pain. She denies paresthesia. Her cardiac risk factors include obesity, diabetes, hypercholesterolemia, and family history of early heart attack.  Past Medical History  Diagnosis Date  . Arthritis   . Degenerative disc disease   . Degenerative disc disease   . Sickle cell trait ([place])   . DDD (degenerative disc disease)   . Anxiety   . Endometriosis   . Scoliosis   . DDD (degenerative disc disease)   . PCOS (polycystic ovarian syndrome)   . Fibroids   . High cholesterol   . Fibromyalgia   . Dysrhythmia     stress and caffeine related  . Leg cramps   .  Insomnia   . Diabetes mellitus     Type 2 insulin resistant  . Anemia   . Complication of anesthesia     anesthesia awareness,  . GERD (gastroesophageal reflux disease)    Past Surgical History  Procedure Laterality Date  . Cesarean section    . Laparoscopy  2002 or 2003  . Wisdom tooth extraction    . Toe surgery  2008  . Biopsy breast    . Back surgery  2010  . Dilatation & curettage/hysteroscopy with myosure N/A 01/01/2015    Procedure: DILATATION & CURETTAGE/HYSTEROSCOPY WITH MYOSURE;  Surgeon: Crawford Givens, MD;  Location: [facility];  Service: Gynecology;  Laterality: N/A;   Family History  Problem Relation Age of Onset  . Diabetes Paternal Grandfather   . Hypertension Paternal Grandfather   . Hyperlipidemia Paternal Grandfather   . Diabetes Paternal Grandmother   . Hypertension Paternal Grandmother   . Hyperlipidemia Paternal Grandmother   . Alzheimer's disease Maternal Grandmother   . Brain cancer Maternal Grandfather   . Diabetes Father   . Hypertension Father   . Hyperlipidemia Father   . Kidney failure Father    Social History  Substance Use Topics  . Smoking status: Never Smoker   . Smokeless tobacco: Never Used  . Alcohol Use: No     Comment: twice anually   OB History    Gravida Para Term Preterm AB TAB SAB Ectopic Multiple Living   4 2 1 1 2  2   1      Review of Systems  Ten  systems reviewed and are negative for acute change, except as noted in the HPI.    Allergies  Shellfish allergy and Sulfa antibiotics  Home Medications   Prior to Admission medications   Medication Sig Start Date End Date Taking? Authorizing Provider  Canagliflozin (INVOKANA) 300 MG TABS Take 300 mg by mouth daily.     Historical Provider, MD  Cyanocobalamin (VITAMIN B 12 PO) Take 1 each by mouth every 7 (seven) days. INJECTION    Historical Provider, MD  cyclobenzaprine (FLEXERIL) 10 MG tablet Take 1 tablet (10 mg total) by mouth 3 (three) times daily as needed for muscle  spasms. 12/04/14   Jola Schmidt, MD  doxycycline (VIBRAMYCIN) 50 MG capsule Take 2 capsules (100 mg total) by mouth 2 (two) times daily. 01/01/15   Crawford Givens, MD  ergocalciferol (VITAMIN D2) 50000 UNITS capsule Take 50,000 Units by mouth once a week.    Historical Provider, MD  Exenatide ER (BYDUREON) 2 MG PEN Inject 2 mg into the skin every 7 (seven) days. Inject on Saturday.    Historical Provider, MD  FeAsp-B12-FA-C-DSS-SuccAc-Zn (FERIVA 21/7 PO) Take 1 tablet by mouth daily.    Historical Provider, MD  HYDROcodone-acetaminophen (NORCO/VICODIN) 5-325 MG per tablet Take 1 tablet by mouth every 6 (six) hours as needed. 01/01/15   Crawford Givens, MD  ibuprofen (ADVIL,MOTRIN) 600 MG tablet Take 1 tablet (600 mg total) by mouth every 6 (six) hours as needed. 01/01/15   Crawford Givens, MD  Insulin Aspart (NOVOLOG [place]) Inject 2-20 Units into the skin daily as needed (blood sugar).     Historical Provider, MD  insulin detemir (LEVEMIR) 100 UNIT/ML injection Inject 42 Units into the skin at bedtime.     Historical Provider, MD  Norgestimate-Ethinyl Estradiol Triphasic (TRI-SPRINTEC) 0.18/0.215/0.25 MG-35 MCG tablet Take 1 tablet by mouth daily. 01/01/15   Crawford Givens, MD  ondansetron (ZOFRAN) 4 MG tablet Take 1 tablet (4 mg total) by mouth every 8 (eight) hours as needed for nausea or vomiting. Patient not taking: Reported on 12/04/2014 10/15/14   [name], MD  valACYclovir (VALTREX) 500 MG tablet Take 1 tablet (500 mg total) by mouth daily. 04/18/12   [name], MD   BP 133/77 mmHg  Pulse 101  Temp(Src) 98.2 F (36.8 C) (Oral)  Resp 14  Ht 5\' 8"  (1.727 m)  Wt 230 lb (104.327 kg)  BMI 34.98 kg/m2  SpO2 97% Physical Exam  Constitutional: She is oriented to person, place, and time. She appears well-developed and well-nourished. No distress.  HENT:  Head: Normocephalic and atraumatic.  Eyes: Conjunctivae are normal. No scleral icterus.  Neck: Normal range of motion.  Cardiovascular: Normal  rate, regular rhythm and normal heart sounds.  Exam reveals no gallop and no friction rub.   No murmur heard. Pulmonary/Chest: Effort normal and breath sounds normal. No respiratory distress.  Abdominal: Soft. Bowel sounds are normal. She exhibits no distension and no mass. There is no tenderness. There is no guarding.  Neurological: She is alert and oriented to person, place, and time.  Skin: Skin is warm and dry. She is not diaphoretic.  Nursing note and vitals reviewed.   ED Course  Procedures (including critical care time) Labs Review Labs Reviewed  URINALYSIS, ROUTINE W REFLEX MICROSCOPIC (NOT AT [facility]) - Abnormal; Notable for the following:    Glucose, UA >1000 (*)    All other components within normal limits  CBG MONITORING, ED - Abnormal; Notable for the following:    Glucose-Capillary 485 (*)  All other components within normal limits  PREGNANCY, URINE  URINE MICROSCOPIC-ADD ON  BASIC METABOLIC PANEL  CBC WITH DIFFERENTIAL/PLATELET  I-STAT TROPOININ, ED    Imaging Review No results found. I have personally reviewed and evaluated these images and lab results as part of my medical decision-making.   EKG Interpretation   Date/Time:  Wednesday February 17 2015 11:57:26 EDT Ventricular Rate:  103 PR Interval:  164 QRS Duration: 80 QT Interval:  340 QTC Calculation: 445 R Axis:   24 Text Interpretation:  Sinus tachycardia Possible Left atrial enlargement  Cannot rule out Anterior infarct , age undetermined Abnormal ECG No  significant change was found Confirmed by Wyvonnia Dusky  MD, STEPHEN [phone]) on  02/17/2015 12:20:47 PM      MDM   Final diagnoses:  None    12:49 PM BP 133/77 mmHg  Pulse 101  Temp(Src) 98.2 F (36.8 C) (Oral)  Resp 14  Ht 5\' 8"  (1.727 m)  Wt 230 lb (104.327 kg)  BMI 34.98 kg/m2  SpO2 97% Patient here with sig hyperglycemia.  Labs pending. Cardiac workup and labs pending. C/o mild headache without  Neuro sxs. Will treat sxs.  Filed  Vitals:   02/17/15 1430 02/17/15 1500 02/17/15 1530 02/17/15 1610  BP: 118/61 120/70 114/62 109/65  Pulse: 90 87 86 88  Temp:    98.6 F (37 C)  TempSrc:    Oral  Resp:    15  Height:      Weight:      SpO2: 100% 99% 98% 100%    Patient with hyperglycemia. Negative CXR, ekg unchanged. Reflux alleviated. Negative troponin. No AGMA. doubty ACS. Patient seen in shared visit with attending physician. VS improved.  Discussed need to follow up asap with physician (preferably within 72 hours) Patient appears safe for discharge.     Margarita Mail, PA-C 02/20/15 1851  Ezequiel Essex, MD 02/21/15 1250

## 2015-04-21 ENCOUNTER — Other Ambulatory Visit: Payer: Self-pay | Admitting: Specialist

## 2015-04-21 DIAGNOSIS — M25511 Pain in right shoulder: Secondary | ICD-10-CM

## 2015-05-01 ENCOUNTER — Inpatient Hospital Stay: Admission: RE | Admit: 2015-05-01 | Payer: BLUE CROSS/BLUE SHIELD | Source: Ambulatory Visit

## 2015-05-04 ENCOUNTER — Other Ambulatory Visit: Payer: BLUE CROSS/BLUE SHIELD

## 2015-06-05 ENCOUNTER — Ambulatory Visit
Admission: RE | Admit: 2015-06-05 | Discharge: 2015-06-05 | Disposition: A | Discharge status: Home / Self Care | Payer: BLUE CROSS/BLUE SHIELD | Source: Ambulatory Visit | Attending: Specialist | Admitting: Specialist

## 2015-06-05 DIAGNOSIS — M25511 Pain in right shoulder: Secondary | ICD-10-CM

## 2015-07-22 ENCOUNTER — Encounter (HOSPITAL_BASED_OUTPATIENT_CLINIC_OR_DEPARTMENT_OTHER): Payer: Self-pay | Admitting: *Deleted

## 2015-07-22 ENCOUNTER — Emergency Department (HOSPITAL_BASED_OUTPATIENT_CLINIC_OR_DEPARTMENT_OTHER)
Admission: EM | Admit: 2015-07-22 | Discharge: 2015-07-22 | Disposition: A | Discharge status: Home / Self Care | Payer: BLUE CROSS/BLUE SHIELD | Attending: Emergency Medicine | Admitting: Emergency Medicine

## 2015-07-22 DIAGNOSIS — E1165 Type 2 diabetes mellitus with hyperglycemia: Secondary | ICD-10-CM | POA: Insufficient documentation

## 2015-07-22 DIAGNOSIS — R252 Cramp and spasm: Secondary | ICD-10-CM | POA: Diagnosis not present

## 2015-07-22 DIAGNOSIS — Z792 Long term (current) use of antibiotics: Secondary | ICD-10-CM | POA: Insufficient documentation

## 2015-07-22 DIAGNOSIS — Z86018 Personal history of other benign neoplasm: Secondary | ICD-10-CM | POA: Insufficient documentation

## 2015-07-22 DIAGNOSIS — Z79818 Long term (current) use of other agents affecting estrogen receptors and estrogen levels: Secondary | ICD-10-CM | POA: Diagnosis not present

## 2015-07-22 DIAGNOSIS — D849 Immunodeficiency, unspecified: Secondary | ICD-10-CM | POA: Insufficient documentation

## 2015-07-22 DIAGNOSIS — N760 Acute vaginitis: Secondary | ICD-10-CM | POA: Diagnosis not present

## 2015-07-22 DIAGNOSIS — M542 Cervicalgia: Secondary | ICD-10-CM | POA: Insufficient documentation

## 2015-07-22 DIAGNOSIS — E860 Dehydration: Secondary | ICD-10-CM | POA: Diagnosis not present

## 2015-07-22 DIAGNOSIS — R739 Hyperglycemia, unspecified: Secondary | ICD-10-CM

## 2015-07-22 DIAGNOSIS — Z3202 Encounter for pregnancy test, result negative: Secondary | ICD-10-CM | POA: Diagnosis not present

## 2015-07-22 DIAGNOSIS — Z7984 Long term (current) use of oral hypoglycemic drugs: Secondary | ICD-10-CM | POA: Insufficient documentation

## 2015-07-22 DIAGNOSIS — Z8669 Personal history of other diseases of the nervous system and sense organs: Secondary | ICD-10-CM | POA: Insufficient documentation

## 2015-07-22 DIAGNOSIS — Z794 Long term (current) use of insulin: Secondary | ICD-10-CM | POA: Diagnosis not present

## 2015-07-22 DIAGNOSIS — M791 Myalgia: Secondary | ICD-10-CM | POA: Diagnosis not present

## 2015-07-22 DIAGNOSIS — R631 Polydipsia: Secondary | ICD-10-CM | POA: Diagnosis present

## 2015-07-22 DIAGNOSIS — K219 Gastro-esophageal reflux disease without esophagitis: Secondary | ICD-10-CM | POA: Insufficient documentation

## 2015-07-22 LAB — PREGNANCY, URINE: PREG TEST UR: NEGATIVE

## 2015-07-22 LAB — BASIC METABOLIC PANEL
Anion gap: 12 (ref 5–15)
BUN: 11 mg/dL (ref 6–20)
CALCIUM: 9 mg/dL (ref 8.9–10.3)
CO2: 26 mmol/L (ref 22–32)
CREATININE: 0.9 mg/dL (ref 0.44–1.00)
Chloride: 99 mmol/L — ABNORMAL LOW (ref 101–111)
Glucose, Bld: 435 mg/dL — ABNORMAL HIGH (ref 65–99)
Potassium: 4 mmol/L (ref 3.5–5.1)
SODIUM: 137 mmol/L (ref 135–145)

## 2015-07-22 LAB — CBC WITH DIFFERENTIAL/PLATELET
BASOS PCT: 1 %
Basophils Absolute: 0.1 10*3/uL (ref 0.0–0.1)
EOS ABS: 0.1 10*3/uL (ref 0.0–0.7)
EOS PCT: 2 %
HCT: 38.8 % (ref 36.0–46.0)
HEMOGLOBIN: 12.7 g/dL (ref 12.0–15.0)
LYMPHS ABS: 2.2 10*3/uL (ref 0.7–4.0)
Lymphocytes Relative: 30 %
MCH: 26.9 pg (ref 26.0–34.0)
MCHC: 32.7 g/dL (ref 30.0–36.0)
MCV: 82.2 fL (ref 78.0–100.0)
Monocytes Absolute: 0.3 10*3/uL (ref 0.1–1.0)
Monocytes Relative: 4 %
NEUTROS PCT: 63 %
Neutro Abs: 4.6 10*3/uL (ref 1.7–7.7)
PLATELETS: 384 10*3/uL (ref 150–400)
RBC: 4.72 MIL/uL (ref 3.87–5.11)
RDW: 15.3 % (ref 11.5–15.5)
WBC: 7.3 10*3/uL (ref 4.0–10.5)

## 2015-07-22 LAB — URINALYSIS, ROUTINE W REFLEX MICROSCOPIC
BILIRUBIN URINE: NEGATIVE
Glucose, UA: >1000 mg/dL — AB
KETONES UR: NEGATIVE mg/dL
Leukocytes, UA: NEGATIVE
NITRITE: NEGATIVE
Protein, ur: NEGATIVE mg/dL
SPECIFIC GRAVITY, URINE: 1.025 (ref 1.005–1.030)
pH: 7 (ref 5.0–8.0)

## 2015-07-22 LAB — URINE MICROSCOPIC-ADD ON

## 2015-07-22 LAB — CBG MONITORING, ED
Glucose-Capillary: 314 mg/dL — ABNORMAL HIGH (ref 65–99)
Glucose-Capillary: 233 mg/dL — ABNORMAL HIGH (ref 65–99)

## 2015-07-22 MED ORDER — INSULIN ASPART 100 UNIT/ML ~~LOC~~ SOLN
6.0000 [IU] | Freq: Once | SUBCUTANEOUS | Status: AC
  Administered 2015-07-22: 6 [IU] via SUBCUTANEOUS
  Filled 2015-07-22: qty 1

## 2015-07-22 MED ORDER — MORPHINE SULFATE (PF) 4 MG/ML IV SOLN
4.0000 mg | Freq: Once | INTRAVENOUS | Status: AC
  Administered 2015-07-22: 4 mg via INTRAVENOUS
  Filled 2015-07-22: qty 1

## 2015-07-22 MED ORDER — SODIUM CHLORIDE 0.9 % IV BOLUS (SEPSIS)
2000.0000 mL | Freq: Once | INTRAVENOUS | Status: AC
  Administered 2015-07-22: 2000 mL via INTRAVENOUS

## 2015-07-22 MED ORDER — FLUCONAZOLE 150 MG PO TABS: 150.0000 mg | ORAL_TABLET | Freq: Once | ORAL | Status: DC

## 2015-07-22 NOTE — ED Notes (Signed)
MD at bedside. 

## 2015-07-22 NOTE — ED Notes (Signed)
Pt ambulated to restroom with no assistance, seemingly with no problem.

## 2015-07-22 NOTE — ED Notes (Signed)
Pt returned to room from [place]. Changing into gown at this time

## 2015-07-22 NOTE — Discharge Instructions (Signed)
Check your blood sugar before each meal and at bedtime, and report those numbers to your endocrinologist. Call your endocrinologist today for follow-up. Make sure that you drink at least  Six 8 ounce glasses of water each day to stay well hydrated. Return if your condition worsens for any reason Blood Glucose Monitoring, Adult Monitoring your blood glucose (also know as blood sugar) helps you to manage your diabetes. It also helps you and your health care provider monitor your diabetes and determine how well your treatment plan is working. WHY SHOULD YOU MONITOR YOUR BLOOD GLUCOSE?  It can help you understand how food, exercise, and medicine affect your blood glucose.  It allows you to know what your blood glucose is at any given moment. You can quickly tell if you are having low blood glucose (hypoglycemia) or high blood glucose (hyperglycemia).  It can help you and your health care provider know how to adjust your medicines.  It can help you understand how to manage an illness or adjust medicine for exercise. WHEN SHOULD YOU TEST? Your health care provider will help you decide how often you should check your blood glucose. This may depend on the type of diabetes you have, your diabetes control, or the types of medicines you are taking. Be sure to write down all of your blood glucose readings so that this information can be reviewed with your health care provider. See below for examples of testing times that your health care provider may suggest. Type 1 Diabetes  Test at least 2 times per day if your diabetes is well controlled, if you are using an insulin pump, or if you perform multiple daily injections.  If your diabetes is not well controlled or if you are sick, you may need to test more often.  It is a good idea to also test:  Before every insulin injection.  Before and after exercise.  Between meals and 2 hours after a meal.  Occasionally between 2:00 a.m. and 3:00 a.m. Type 2  Diabetes  If you are taking insulin, test at least 2 times per day. However, it is best to test before every insulin injection.  If you take medicines by mouth (orally), test 2 times a day.  If you are on a controlled diet, test once a day.  If your diabetes is not well controlled or if you are sick, you may need to monitor more often. HOW TO MONITOR YOUR BLOOD GLUCOSE Supplies Needed  Blood glucose meter.  Test strips for your meter. Each meter has its own strips. You must use the strips that go with your own meter.  A pricking needle (lancet).  A device that holds the lancet (lancing device).  A journal or log book to write down your results. Procedure  Wash your hands with soap and water. Alcohol is not preferred.  Prick the side of your finger (not the tip) with the lancet.  Gently milk the finger until a small drop of blood appears.  Follow the instructions that come with your meter for inserting the test strip, applying blood to the strip, and using your blood glucose meter. Other Areas to Get Blood for Testing Some meters allow you to use other areas of your body (other than your finger) to test your blood. These areas are called alternative sites. The most common alternative sites are:  The forearm.  The thigh.  The back area of the lower leg.  The palm of the hand. The blood flow in these  areas is slower. Therefore, the blood glucose values you get may be delayed, and the numbers are different from what you would get from your fingers. Do not use alternative sites if you think you are having hypoglycemia. Your reading will not be accurate. Always use a finger if you are having hypoglycemia. Also, if you cannot feel your lows (hypoglycemia unawareness), always use your fingers for your blood glucose checks. ADDITIONAL TIPS FOR GLUCOSE MONITORING  Do not reuse lancets.  Always carry your supplies with you.  All blood glucose meters have a 24-hour "hotline" number  to call if you have questions or need help.  Adjust (calibrate) your blood glucose meter with a control solution after finishing a few boxes of strips. BLOOD GLUCOSE RECORD KEEPING It is a good idea to keep a daily record or log of your blood glucose readings. Most glucose meters, if not all, keep your glucose records stored in the meter. Some meters come with the ability to download your records to your home computer. Keeping a record of your blood glucose readings is especially helpful if you are wanting to look for patterns. Make notes to go along with the blood glucose readings because you might forget what happened at that exact time. Keeping good records helps you and your health care provider to work together to achieve good diabetes management.    This information is not intended to replace advice given to you by your health care provider. Make sure you discuss any questions you have with your health care provider.   Document Released: 04/20/2003 Document Revised: 05/08/2014 Document Reviewed: 09/09/2012 Elsevier Interactive Patient Education [employer].

## 2015-07-22 NOTE — ED Notes (Signed)
Second saline bag completed before CBG performed.

## 2015-07-22 NOTE — ED Provider Notes (Signed)
CSN: [id number]     Arrival date & time 07/22/15  [id number] History   First MD Initiated Contact with Patient 07/22/15 1004     No chief complaint on file.    (Consider location/radiation/quality/duration/timing/severity/associated sxs/prior Treatment) HPI Complains of bilateral legcramps, right-sided neck pain and right  typical of pain from fibromyalgia onset approximate 5 days ago. Also complains of some right shoulder pain as result of rotator cuff injury. Patient feels dehydrated . She last checked her blood sugar yesterday at 3:30 PM which was 408. Other associated symptoms include polydipsia and polyuria. She denies nausea or vomiting denies fever. She's treated herself with tramadol with transient relief. Nothing makes symptoms better or worse. No other associated symptoms Past Medical History  Diagnosis Date  . Arthritis   . Degenerative disc disease   . Degenerative disc disease   . Sickle cell trait ([place])   . DDD (degenerative disc disease)   . Anxiety   . Endometriosis   . Scoliosis   . DDD (degenerative disc disease)   . PCOS (polycystic ovarian syndrome)   . Fibroids   . High cholesterol   . Fibromyalgia   . Dysrhythmia     stress and caffeine related  . Leg cramps   . Insomnia   . Diabetes mellitus     Type 2 insulin resistant  . Anemia   . Complication of anesthesia     anesthesia awareness,  . GERD (gastroesophageal reflux disease)    Past Surgical History  Procedure Laterality Date  . Cesarean section    . Laparoscopy  2002 or 2003  . Wisdom tooth extraction    . Toe surgery  2008  . Biopsy breast    . Back surgery  2010  . Dilatation & curettage/hysteroscopy with myosure N/A 01/01/2015    Procedure: DILATATION & CURETTAGE/HYSTEROSCOPY WITH MYOSURE;  Surgeon: Crawford Givens, MD;  Location: [facility];  Service: Gynecology;  Laterality: N/A;   Family History  Problem Relation Age of Onset  . Diabetes Paternal Grandfather   . Hypertension Paternal Grandfather    . Hyperlipidemia Paternal Grandfather   . Diabetes Paternal Grandmother   . Hypertension Paternal Grandmother   . Hyperlipidemia Paternal Grandmother   . Alzheimer's disease Maternal Grandmother   . Brain cancer Maternal Grandfather   . Diabetes Father   . Hypertension Father   . Hyperlipidemia Father   . Kidney failure Father    Social History  Substance Use Topics  . Smoking status: Never Smoker   . Smokeless tobacco: Never Used  . Alcohol Use: No     Comment: twice anually   OB History    Gravida Para Term Preterm AB TAB SAB Ectopic Multiple Living   4 2 1 1 2  2   1      Review of Systems  Endocrine: Positive for polydipsia and polyphagia.  Genitourinary:       Irregular menses. Patient feels as if she has yeast vaginitis  Musculoskeletal: Positive for myalgias and arthralgias.  Allergic/Immunologic: Positive for immunocompromised state.       Diabetic  All other systems reviewed and are negative.     Allergies  Shellfish allergy and Sulfa antibiotics  Home Medications   Prior to Admission medications   Medication Sig Start Date End Date Taking? Authorizing Provider  Canagliflozin (INVOKANA) 300 MG TABS Take 300 mg by mouth daily.     Historical Provider, MD  Cyanocobalamin (VITAMIN B 12 PO) Take 1 each by mouth every 7 (seven)  days. INJECTION    Historical Provider, MD  cyclobenzaprine (FLEXERIL) 10 MG tablet Take 1 tablet (10 mg total) by mouth 3 (three) times daily as needed for muscle spasms. 12/04/14   Jola Schmidt, MD  doxycycline (VIBRAMYCIN) 50 MG capsule Take 2 capsules (100 mg total) by mouth 2 (two) times daily. 01/01/15   Crawford Givens, MD  ergocalciferol (VITAMIN D2) 50000 UNITS capsule Take 50,000 Units by mouth once a week.    Historical Provider, MD  Exenatide ER (BYDUREON) 2 MG PEN Inject 2 mg into the skin every 7 (seven) days. Inject on Saturday.    Historical Provider, MD  FeAsp-B12-FA-C-DSS-SuccAc-Zn (FERIVA 21/7 PO) Take 1 tablet by mouth daily.     Historical Provider, MD  HYDROcodone-acetaminophen (NORCO/VICODIN) 5-325 MG per tablet Take 1 tablet by mouth every 6 (six) hours as needed. 01/01/15   Crawford Givens, MD  ibuprofen (ADVIL,MOTRIN) 600 MG tablet Take 1 tablet (600 mg total) by mouth every 6 (six) hours as needed. 01/01/15   Crawford Givens, MD  Insulin Aspart (NOVOLOG [place]) Inject 2-20 Units into the skin daily as needed (blood sugar).     Historical Provider, MD  insulin detemir (LEVEMIR) 100 UNIT/ML injection Inject 42 Units into the skin at bedtime.     Historical Provider, MD  Norgestimate-Ethinyl Estradiol Triphasic (TRI-SPRINTEC) 0.18/0.215/0.25 MG-35 MCG tablet Take 1 tablet by mouth daily. 01/01/15   Crawford Givens, MD  ondansetron (ZOFRAN) 4 MG tablet Take 1 tablet (4 mg total) by mouth every 8 (eight) hours as needed for nausea or vomiting. Patient not taking: Reported on 12/04/2014 10/15/14   [name], MD  RABEprazole (ACIPHEX) 20 MG tablet Take 1 tablet (20 mg total) by mouth daily. 02/17/15   Margarita Mail, PA-C  valACYclovir (VALTREX) 500 MG tablet Take 1 tablet (500 mg total) by mouth daily. 04/18/12   [name], MD   BP 112/74 mmHg  Pulse 105  Temp(Src) 97.7 F (36.5 C) (Oral)  Resp 18  Ht 5\' 8"  (1.727 m)  Wt 225 lb (102.059 kg)  BMI 34.22 kg/m2  SpO2 100% Physical Exam  Constitutional: She appears well-developed and well-nourished.  HENT:  Head: Normocephalic and atraumatic.  Mucous membranes dry  Eyes: Conjunctivae are normal. Pupils are equal, round, and reactive to light.  Neck: Neck supple. No tracheal deviation present. No thyromegaly present.  Cardiovascular: Normal rate and regular rhythm.   No murmur heard. Pulmonary/Chest: Effort normal and breath sounds normal.  Abdominal: Soft. Bowel sounds are normal. She exhibits no distension. There is no tenderness.  Obese  Musculoskeletal: Normal range of motion. She exhibits no edema or tenderness.  Neurological: She is alert. Coordination normal.   Skin: Skin is warm and dry. No rash noted.  Psychiatric: She has a normal mood and affect.  Nursing note and vitals reviewed.   ED Course  Procedures (including critical care time) Labs Review Labs Reviewed - No data to display  Imaging Review No results found. I have personally reviewed and evaluated these images and lab results as part of my medical decision-making.   EKG Interpretation None     11:40 AM feels improved after treatment with intravenous fluids and intravenous morphine.  1 PM feels much improved very to go home. Results for orders placed or performed during the hospital encounter of [id number]  Basic metabolic panel  Result Value Ref Range   Sodium 137 135 - 145 mmol/L   Potassium 4.0 3.5 - 5.1 mmol/L   Chloride 99 (L) 101 -  111 mmol/L   CO2 26 22 - 32 mmol/L   Glucose, Bld 435 (H) 65 - 99 mg/dL   BUN 11 6 - 20 mg/dL   Creatinine, Ser 0.90 0.44 - 1.00 mg/dL   Calcium 9.0 8.9 - 10.3 mg/dL   GFR calc non Af Amer >60 >60 mL/min   GFR calc Af Amer >60 >60 mL/min   Anion gap 12 5 - 15  CBC with Differential/Platelet  Result Value Ref Range   WBC 7.3 4.0 - 10.5 K/uL   RBC 4.72 3.87 - 5.11 MIL/uL   Hemoglobin 12.7 12.0 - 15.0 g/dL   HCT 38.8 36.0 - 46.0 %   MCV 82.2 78.0 - 100.0 fL   MCH 26.9 26.0 - 34.0 pg   MCHC 32.7 30.0 - 36.0 g/dL   RDW 15.3 11.5 - 15.5 %   Platelets 384 150 - 400 K/uL   Neutrophils Relative % 63 %   Neutro Abs 4.6 1.7 - 7.7 K/uL   Lymphocytes Relative 30 %   Lymphs Abs 2.2 0.7 - 4.0 K/uL   Monocytes Relative 4 %   Monocytes Absolute 0.3 0.1 - 1.0 K/uL   Eosinophils Relative 2 %   Eosinophils Absolute 0.1 0.0 - 0.7 K/uL   Basophils Relative 1 %   Basophils Absolute 0.1 0.0 - 0.1 K/uL  Urinalysis, Routine w reflex microscopic (not at [facility])  Result Value Ref Range   Color, Urine YELLOW YELLOW   APPearance CLOUDY (A) CLEAR   Specific Gravity, Urine 1.025 1.005 - 1.030   pH 7.0 5.0 - 8.0   Glucose, UA >1000 (A) NEGATIVE mg/dL    Hgb urine dipstick SMALL (A) NEGATIVE   Bilirubin Urine NEGATIVE NEGATIVE   Ketones, ur NEGATIVE NEGATIVE mg/dL   Protein, ur NEGATIVE NEGATIVE mg/dL   Nitrite NEGATIVE NEGATIVE   Leukocytes, UA NEGATIVE NEGATIVE  Pregnancy, urine  Result Value Ref Range   Preg Test, Ur NEGATIVE NEGATIVE  Urine microscopic-add on  Result Value Ref Range   Squamous Epithelial / LPF 0-5 (A) NONE SEEN   WBC, UA 0-5 0 - 5 WBC/hpf   RBC / HPF 0-5 0 - 5 RBC/hpf   Bacteria, UA RARE (A) NONE SEEN  CBG monitoring, ED  Result Value Ref Range   Glucose-Capillary 314 (H) 65 - 99 mg/dL   No results found.  MDM  Patient will be given 6 units this NovoLog subcutaneously prior to discharge for hyperglycemia. Final diagnoses:  None  Plan prescription diflucan. Encourage oral hydration. Follow-up with endocrinologist. Patient encouraged to check blood sugar before meals and at bedtime Diagnoses #1 hyperglycemia #2 dehydration #3 vaginitis #4 myalgias      Orlie Dakin, MD 07/22/15 1348

## 2015-07-22 NOTE — ED Notes (Signed)
Day 3 of leg, neck and shoulder pain. Pt has fibromyalgia and a right rotator cuff injury

## 2015-09-01 DIAGNOSIS — F332 Major depressive disorder, recurrent severe without psychotic features: Secondary | ICD-10-CM | POA: Diagnosis not present

## 2015-09-01 DIAGNOSIS — G4709 Other insomnia: Secondary | ICD-10-CM | POA: Diagnosis not present

## 2015-09-01 DIAGNOSIS — F4323 Adjustment disorder with mixed anxiety and depressed mood: Secondary | ICD-10-CM | POA: Diagnosis not present

## 2015-09-01 DIAGNOSIS — Z79899 Other long term (current) drug therapy: Secondary | ICD-10-CM | POA: Diagnosis not present

## 2015-09-22 DIAGNOSIS — M797 Fibromyalgia: Secondary | ICD-10-CM | POA: Diagnosis not present

## 2015-09-22 DIAGNOSIS — M5417 Radiculopathy, lumbosacral region: Secondary | ICD-10-CM | POA: Diagnosis not present

## 2015-09-22 DIAGNOSIS — G5603 Carpal tunnel syndrome, bilateral upper limbs: Secondary | ICD-10-CM | POA: Diagnosis not present

## 2015-09-22 DIAGNOSIS — G603 Idiopathic progressive neuropathy: Secondary | ICD-10-CM | POA: Diagnosis not present

## 2015-09-29 ENCOUNTER — Encounter (HOSPITAL_COMMUNITY): Payer: Self-pay | Admitting: Emergency Medicine

## 2015-09-29 ENCOUNTER — Ambulatory Visit (HOSPITAL_COMMUNITY)
Admission: EM | Admit: 2015-09-29 | Discharge: 2015-09-29 | Disposition: A | Discharge status: Home / Self Care | Payer: BLUE CROSS/BLUE SHIELD | Attending: Emergency Medicine | Admitting: Emergency Medicine

## 2015-09-29 DIAGNOSIS — R739 Hyperglycemia, unspecified: Secondary | ICD-10-CM

## 2015-09-29 DIAGNOSIS — M797 Fibromyalgia: Secondary | ICD-10-CM | POA: Diagnosis not present

## 2015-09-29 LAB — POCT I-STAT, CHEM 8
BUN: 15 mg/dL (ref 6–20)
CALCIUM ION: 1.13 mmol/L (ref 1.12–1.23)
Chloride: 98 mmol/L — ABNORMAL LOW (ref 101–111)
Creatinine, Ser: 0.8 mg/dL (ref 0.44–1.00)
GLUCOSE: 564 mg/dL — AB (ref 65–99)
HCT: 46 % (ref 36.0–46.0)
HEMOGLOBIN: 15.6 g/dL — AB (ref 12.0–15.0)
POTASSIUM: 3.8 mmol/L (ref 3.5–5.1)
Sodium: 134 mmol/L — ABNORMAL LOW (ref 135–145)
TCO2: 21 mmol/L (ref 0–100)

## 2015-09-29 MED ORDER — INSULIN ASPART 100 UNIT/ML ~~LOC~~ SOLN
10.0000 [IU] | Freq: Once | SUBCUTANEOUS | Status: AC
  Administered 2015-09-29: 10 [IU] via SUBCUTANEOUS

## 2015-09-29 MED ORDER — GLUCOSE BLOOD VI STRP: 1.0000 | ORAL_STRIP | Status: DC | PRN

## 2015-09-29 MED ORDER — KETOROLAC TROMETHAMINE 60 MG/2ML IM SOLN
60.0000 mg | Freq: Once | INTRAMUSCULAR | Status: AC
  Administered 2015-09-29: 60 mg via INTRAMUSCULAR

## 2015-09-29 MED ORDER — MORPHINE SULFATE (PF) 2 MG/ML IV SOLN
2.0000 mg | Freq: Once | INTRAVENOUS | Status: AC
  Administered 2015-09-29: 2 mg via INTRAMUSCULAR

## 2015-09-29 MED ORDER — INSULIN ASPART 100 UNIT/ML ~~LOC~~ SOLN
SUBCUTANEOUS | Status: AC
  Filled 2015-09-29: qty 1

## 2015-09-29 MED ORDER — HYDROCODONE-ACETAMINOPHEN 5-325 MG PO TABS: 1.0000 | ORAL_TABLET | Freq: Four times a day (QID) | ORAL | Status: DC | PRN

## 2015-09-29 MED ORDER — KETOROLAC TROMETHAMINE 60 MG/2ML IM SOLN
INTRAMUSCULAR | Status: AC
  Filled 2015-09-29: qty 2

## 2015-09-29 MED ORDER — MORPHINE SULFATE (PF) 2 MG/ML IV SOLN
INTRAVENOUS | Status: AC
  Filled 2015-09-29: qty 1

## 2015-09-29 MED ORDER — DIAZEPAM 5 MG PO TABS: 5.0000 mg | ORAL_TABLET | Freq: Every evening | ORAL | Status: DC | PRN

## 2015-09-29 NOTE — ED Notes (Signed)
PT has fibromyalgia. PT reports every now and then she has a "crisis" and her meds are not effective. PT reports she takes tramadol for pain and it is not helping at all. PT has generalized pain all over her body. PT is trying to get into pain management.

## 2015-09-29 NOTE — ED Provider Notes (Signed)
CSN: [id number]     Arrival date & time 09/29/15  1730 History   First MD Initiated Contact with Patient 09/29/15 1849     Chief Complaint  Patient presents with  . Extremity Pain   (Consider location/radiation/quality/duration/timing/severity/associated sxs/prior Treatment) HPI  She is a 45 year old woman here for evaluation of fibromyalgia pain crisis. She has a long history of fibromyalgia, arthritis, and neuropathy. She reports being on multiple medications in the past including Lyrica, gabapentin, muscle relaxers, anti-inflammatories, anti-depressents, as well as receiving injections. None of these therapies have been particularly beneficial. Her doctor has referred her to a pain clinic, but she is still waiting to hear about an appointment. She states she will typically have flareups of pain after [place] weather. She did try taking tramadol yesterday without improvement. She states for the last few days she has been unable to sleep for more than an hour at a time due to pain.  Pain is primarily in her back, but also moves among her extremities. It is described as a deep aching pain. She also reports frequent cramping, primarily in her feet and legs. No fevers or injuries.  Past Medical History  Diagnosis Date  . Arthritis   . Degenerative disc disease   . Degenerative disc disease   . Sickle cell trait ([place])   . DDD (degenerative disc disease)   . Anxiety   . Endometriosis   . Scoliosis   . DDD (degenerative disc disease)   . PCOS (polycystic ovarian syndrome)   . Fibroids   . High cholesterol   . Fibromyalgia   . Dysrhythmia     stress and caffeine related  . Leg cramps   . Insomnia   . Diabetes mellitus     Type 2 insulin resistant  . Anemia   . Complication of anesthesia     anesthesia awareness,  . GERD (gastroesophageal reflux disease)    Past Surgical History  Procedure Laterality Date  . Cesarean section    . Laparoscopy  2002 or 2003  . Wisdom tooth extraction     . Toe surgery  2008  . Biopsy breast    . Back surgery  2010  . Dilatation & curettage/hysteroscopy with myosure N/A 01/01/2015    Procedure: DILATATION & CURETTAGE/HYSTEROSCOPY WITH MYOSURE;  Surgeon: Crawford Givens, MD;  Location: [facility];  Service: Gynecology;  Laterality: N/A;   Family History  Problem Relation Age of Onset  . Diabetes Paternal Grandfather   . Hypertension Paternal Grandfather   . Hyperlipidemia Paternal Grandfather   . Diabetes Paternal Grandmother   . Hypertension Paternal Grandmother   . Hyperlipidemia Paternal Grandmother   . Alzheimer's disease Maternal Grandmother   . Brain cancer Maternal Grandfather   . Diabetes Father   . Hypertension Father   . Hyperlipidemia Father   . Kidney failure Father    Social History  Substance Use Topics  . Smoking status: Never Smoker   . Smokeless tobacco: Never Used  . Alcohol Use: No     Comment: twice anually   OB History    Gravida Para Term Preterm AB TAB SAB Ectopic Multiple Living   4 2 1 1 2  2   1      Review of Systems As in history of present illness Allergies  Shellfish allergy and Sulfa antibiotics  Home Medications   Prior to Admission medications   Medication Sig Start Date End Date Taking? Authorizing Provider  traMADol (ULTRAM) 50 MG tablet Take 50 mg by  mouth every 6 (six) hours as needed.   Yes Historical Provider, MD  Canagliflozin (INVOKANA) 300 MG TABS Take 300 mg by mouth daily.     Historical Provider, MD  Cyanocobalamin (VITAMIN B 12 PO) Take 1 each by mouth every 7 (seven) days. INJECTION    Historical Provider, MD  cyclobenzaprine (FLEXERIL) 10 MG tablet Take 1 tablet (10 mg total) by mouth 3 (three) times daily as needed for muscle spasms. 12/04/14   Jola Schmidt, MD  diazepam (VALIUM) 5 MG tablet Take 1 tablet (5 mg total) by mouth at bedtime as needed for anxiety or muscle spasms. 09/29/15   Melony Overly, MD  ergocalciferol (VITAMIN D2) 50000 UNITS capsule Take 50,000 Units by mouth once  a week.    Historical Provider, MD  Exenatide ER (BYDUREON) 2 MG PEN Inject 2 mg into the skin every 7 (seven) days. Inject on Saturday.    Historical Provider, MD  FeAsp-B12-FA-C-DSS-SuccAc-Zn (FERIVA 21/7 PO) Take 1 tablet by mouth daily.    Historical Provider, MD  fluconazole (DIFLUCAN) 150 MG tablet Take 1 tablet (150 mg total) by mouth once. 07/22/15   Orlie Dakin, MD  glucose blood test strip 1 each by Other route as needed for other. Use as instructed 09/29/15   Melony Overly, MD  HYDROcodone-acetaminophen (NORCO) 5-325 MG tablet Take 1 tablet by mouth every 6 (six) hours as needed for moderate pain. 09/29/15   Melony Overly, MD  ibuprofen (ADVIL,MOTRIN) 600 MG tablet Take 1 tablet (600 mg total) by mouth every 6 (six) hours as needed. 01/01/15   Crawford Givens, MD  Insulin Aspart (NOVOLOG [place]) Inject 2-20 Units into the skin daily as needed (blood sugar).     Historical Provider, MD  insulin detemir (LEVEMIR) 100 UNIT/ML injection Inject 42 Units into the skin at bedtime.     Historical Provider, MD  Liraglutide (VICTOZA ) Inject into the skin.    Historical Provider, MD  Norgestimate-Ethinyl Estradiol Triphasic (TRI-SPRINTEC) 0.18/0.215/0.25 MG-35 MCG tablet Take 1 tablet by mouth daily. 01/01/15   Crawford Givens, MD  RABEprazole (ACIPHEX) 20 MG tablet Take 1 tablet (20 mg total) by mouth daily. 02/17/15   Margarita Mail, PA-C  valACYclovir (VALTREX) 500 MG tablet Take 1 tablet (500 mg total) by mouth daily. 04/18/12   Crawford Givens, MD   Meds Ordered and Administered this Visit   Medications  ketorolac (TORADOL) injection 60 mg (60 mg Intramuscular Given 09/29/15 1925)  morphine 2 MG/ML injection 2 mg (2 mg Intramuscular Given 09/29/15 1925)  insulin aspart (novoLOG) injection 10 Units (10 Units Subcutaneous Given 09/29/15 2002)    BP 124/83 mmHg  Pulse 95  Temp(Src) 98.6 F (37 C) (Oral)  Resp 18  SpO2 100%  LMP 09/01/2015 No data found.   Physical Exam  Constitutional: She is  oriented to person, place, and time. She appears well-developed and well-nourished. No distress.  Does become intermittently tearful during interview.  Neck: Neck supple.  Cardiovascular: Normal rate, regular rhythm and normal heart sounds.   No murmur heard. Pulmonary/Chest: Effort normal and breath sounds normal. No respiratory distress. She has no wheezes. She has no rales.  Musculoskeletal:  She is diffusely tender to palpation of major muscle groups.  Neurological: She is alert and oriented to person, place, and time.    ED Course  Procedures (including critical care time)  Labs Review Labs Reviewed  POCT I-STAT, CHEM 8 - Abnormal; Notable for the following:    Sodium 134 (*)  Chloride 98 (*)    Glucose, Bld 564 (*)    Hemoglobin 15.6 (*)    All other components within normal limits    Imaging Review No results found.   MDM   1. Fibromyalgia muscle pain   2. Hyperglycemia    Toradol and morphine given here for pain. Will give 10 units subcutaneous insulin prior to discharge. Prescriptions given for Valium and hydrocodone to use for pain. Follow-up with PCP as needed.    Melony Overly, MD 09/29/15 2006

## 2015-09-29 NOTE — Discharge Instructions (Signed)
I'm sorry you are having such a hard time with pain and sleep right now. Use the Valium at bedtime as needed for sleep and muscle spasms. Use the hydrocodone every 4-6 hours as needed for pain. Try to take these medicines 4 hours apart. Keep a close eye on your blood sugars. Follow-up as needed.

## 2015-09-29 NOTE — ED Notes (Signed)
Insulin verified and witnessed by Ruby Cola, [place]

## 2015-10-11 ENCOUNTER — Emergency Department (HOSPITAL_COMMUNITY)
Admission: EM | Admit: 2015-10-11 | Discharge: 2015-10-12 | Disposition: A | Discharge status: Other Acute Inpatient Hospital | Payer: BLUE CROSS/BLUE SHIELD | Attending: Emergency Medicine | Admitting: Emergency Medicine

## 2015-10-11 ENCOUNTER — Encounter (HOSPITAL_COMMUNITY): Payer: Self-pay | Admitting: *Deleted

## 2015-10-11 DIAGNOSIS — Z794 Long term (current) use of insulin: Secondary | ICD-10-CM | POA: Insufficient documentation

## 2015-10-11 DIAGNOSIS — E119 Type 2 diabetes mellitus without complications: Secondary | ICD-10-CM | POA: Insufficient documentation

## 2015-10-11 DIAGNOSIS — M1990 Unspecified osteoarthritis, unspecified site: Secondary | ICD-10-CM | POA: Insufficient documentation

## 2015-10-11 DIAGNOSIS — F322 Major depressive disorder, single episode, severe without psychotic features: Secondary | ICD-10-CM | POA: Diagnosis not present

## 2015-10-11 DIAGNOSIS — Z792 Long term (current) use of antibiotics: Secondary | ICD-10-CM | POA: Insufficient documentation

## 2015-10-11 DIAGNOSIS — F332 Major depressive disorder, recurrent severe without psychotic features: Secondary | ICD-10-CM | POA: Insufficient documentation

## 2015-10-11 DIAGNOSIS — G8929 Other chronic pain: Secondary | ICD-10-CM | POA: Diagnosis not present

## 2015-10-11 DIAGNOSIS — Z79899 Other long term (current) drug therapy: Secondary | ICD-10-CM | POA: Diagnosis not present

## 2015-10-11 DIAGNOSIS — Z8719 Personal history of other diseases of the digestive system: Secondary | ICD-10-CM | POA: Diagnosis not present

## 2015-10-11 DIAGNOSIS — R45851 Suicidal ideations: Secondary | ICD-10-CM | POA: Diagnosis not present

## 2015-10-11 LAB — COMPREHENSIVE METABOLIC PANEL
ALT: 12 U/L — AB (ref 14–54)
AST: 11 U/L — AB (ref 15–41)
Albumin: 3.8 g/dL (ref 3.5–5.0)
Alkaline Phosphatase: 103 U/L (ref 38–126)
Anion gap: 8 (ref 5–15)
BUN: 16 mg/dL (ref 6–20)
CHLORIDE: 100 mmol/L — AB (ref 101–111)
CO2: 24 mmol/L (ref 22–32)
CREATININE: 1.04 mg/dL — AB (ref 0.44–1.00)
Calcium: 8.8 mg/dL — ABNORMAL LOW (ref 8.9–10.3)
GFR calc non Af Amer: >60 mL/min (ref >60)
Glucose, Bld: 375 mg/dL — ABNORMAL HIGH (ref 65–99)
POTASSIUM: 3.9 mmol/L (ref 3.5–5.1)
SODIUM: 132 mmol/L — AB (ref 135–145)
Total Bilirubin: 0.7 mg/dL (ref 0.3–1.2)
Total Protein: 7.9 g/dL (ref 6.5–8.1)

## 2015-10-11 LAB — CBC
HCT: 40.2 % (ref 36.0–46.0)
Hemoglobin: 13.5 g/dL (ref 12.0–15.0)
MCH: 27.1 pg (ref 26.0–34.0)
MCHC: 33.6 g/dL (ref 30.0–36.0)
MCV: 80.6 fL (ref 78.0–100.0)
PLATELETS: 323 10*3/uL (ref 150–400)
RBC: 4.99 MIL/uL (ref 3.87–5.11)
RDW: 14.1 % (ref 11.5–15.5)
WBC: 8.3 10*3/uL (ref 4.0–10.5)

## 2015-10-11 LAB — RAPID URINE DRUG SCREEN, HOSP PERFORMED
Amphetamines: NOT DETECTED
Barbiturates: NOT DETECTED
Benzodiazepines: POSITIVE — AB
Cocaine: NOT DETECTED
OPIATES: NOT DETECTED
Tetrahydrocannabinol: NOT DETECTED

## 2015-10-11 LAB — ETHANOL: Alcohol, Ethyl (B): <5 mg/dL (ref <5)

## 2015-10-11 LAB — CBG MONITORING, ED
GLUCOSE-CAPILLARY: 427 mg/dL — AB (ref 65–99)
Glucose-Capillary: 356 mg/dL — ABNORMAL HIGH (ref 65–99)

## 2015-10-11 LAB — SALICYLATE LEVEL

## 2015-10-11 LAB — ACETAMINOPHEN LEVEL: Acetaminophen (Tylenol), Serum: <10 ug/mL — ABNORMAL LOW (ref 10–30)

## 2015-10-11 MED ORDER — ZOLPIDEM TARTRATE 5 MG PO TABS: 5.0000 mg | ORAL_TABLET | Freq: Every evening | ORAL | Status: DC | PRN

## 2015-10-11 MED ORDER — ACETAMINOPHEN 325 MG PO TABS: 650.0000 mg | ORAL_TABLET | ORAL | Status: DC | PRN

## 2015-10-11 MED ORDER — ONDANSETRON HCL 4 MG PO TABS: 4.0000 mg | ORAL_TABLET | Freq: Three times a day (TID) | ORAL | Status: DC | PRN

## 2015-10-11 MED ORDER — INSULIN GLARGINE 100 UNIT/ML ~~LOC~~ SOLN
20.0000 [IU] | Freq: Every day | SUBCUTANEOUS | Status: DC
  Administered 2015-10-11 – 2015-10-12 (×2): 20 [IU] via SUBCUTANEOUS
  Filled 2015-10-11 (×2): qty 0.2

## 2015-10-11 MED ORDER — INSULIN ASPART 100 UNIT/ML ~~LOC~~ SOLN
10.0000 [IU] | Freq: Once | SUBCUTANEOUS | Status: AC
  Administered 2015-10-11: 10 [IU] via SUBCUTANEOUS
  Filled 2015-10-11: qty 1

## 2015-10-11 MED ORDER — NICOTINE 21 MG/24HR TD PT24
21.0000 mg | MEDICATED_PATCH | Freq: Every day | TRANSDERMAL | Status: DC
  Filled 2015-10-11: qty 1

## 2015-10-11 MED ORDER — ALUM & MAG HYDROXIDE-SIMETH 200-200-20 MG/5ML PO SUSP: 30.0000 mL | ORAL | Status: DC | PRN

## 2015-10-11 MED ORDER — FENTANYL 12 MCG/HR TD PT72
12.5000 ug | MEDICATED_PATCH | TRANSDERMAL | Status: DC
  Administered 2015-10-11: 12.5 ug via TRANSDERMAL
  Filled 2015-10-11: qty 1

## 2015-10-11 NOTE — ED Notes (Addendum)
Pt has been changed to scrubs, wanded by security, given meal/drink, ambulatory to restroom to provide urine sample. Most of pt belongings were sent with her husband, except the pt clothing. Pt requested a $20 bill to be locked up (locked up with security in lock box 6)

## 2015-10-11 NOTE — ED Notes (Signed)
Spoke with Claudette Head, pt has a bed at [facility], can be transported after midnight and glucose <350. Will administer insulin and recheck glucose.

## 2015-10-11 NOTE — ED Provider Notes (Signed)
CSN: [id number]     Arrival date & time 10/11/15  1802 History   First MD Initiated Contact with Patient 10/11/15 1944     Chief Complaint  Patient presents with  . Depression  . Suicidal      HPI  Expand All Collapse All   Pt reports her endocrinologist instructed her to come to the ED for medical clearance d/t depression. Pt states "5 out of 7 days I feel like jumping off a bridge" D/t fibromyalgia pain. Pt also reports hyperglycemia. States at times she is insulin resistant that her body is resistant to the insulin, she is also on PO DM meds. Pt's husband states that he did not know that this is happening until today. A week ago, she sort of mentioned something to him but he states that he just brushed it off. He thinks that her depression has to do with their 35 yo daughter suddenly moving to [place] and telling them that she was getting married. And thinks that she is not taking it well.        Past Medical History  Diagnosis Date  . Arthritis   . Degenerative disc disease   . Degenerative disc disease   . Sickle cell trait ([place])   . DDD (degenerative disc disease)   . Anxiety   . Endometriosis   . Scoliosis   . DDD (degenerative disc disease)   . PCOS (polycystic ovarian syndrome)   . Fibroids   . High cholesterol   . Fibromyalgia   . Dysrhythmia     stress and caffeine related  . Leg cramps   . Insomnia   . Diabetes mellitus     Type 2 insulin resistant  . Anemia   . Complication of anesthesia     anesthesia awareness,  . GERD (gastroesophageal reflux disease)    Past Surgical History  Procedure Laterality Date  . Cesarean section    . Laparoscopy  2002 or 2003  . Wisdom tooth extraction    . Toe surgery  2008  . Biopsy breast    . Back surgery  2010  . Dilatation & curettage/hysteroscopy with myosure N/A 01/01/2015    Procedure: DILATATION & CURETTAGE/HYSTEROSCOPY WITH MYOSURE;  Surgeon: Crawford Givens, MD;  Location: [facility];  Service: Gynecology;   Laterality: N/A;   Family History  Problem Relation Age of Onset  . Diabetes Paternal Grandfather   . Hypertension Paternal Grandfather   . Hyperlipidemia Paternal Grandfather   . Diabetes Paternal Grandmother   . Hypertension Paternal Grandmother   . Hyperlipidemia Paternal Grandmother   . Alzheimer's disease Maternal Grandmother   . Brain cancer Maternal Grandfather   . Diabetes Father   . Hypertension Father   . Hyperlipidemia Father   . Kidney failure Father    Social History  Substance Use Topics  . Smoking status: Never Smoker   . Smokeless tobacco: Never Used  . Alcohol Use: No     Comment: twice anually   OB History    Gravida Para Term Preterm AB TAB SAB Ectopic Multiple Living   4 2 1 1 2  2   1      Review of Systems  Psychiatric/Behavioral: Positive for suicidal ideas.  All other systems reviewed and are negative.     Allergies  Shellfish allergy and Sulfa antibiotics  Home Medications   Prior to Admission medications   Medication Sig Start Date End Date Taking? Authorizing Provider  Canagliflozin (INVOKANA) 300 MG TABS Take 300 mg  by mouth daily.    Yes Historical Provider, MD  Cyanocobalamin (VITAMIN B 12 PO) Take 1 each by mouth every 7 (seven) days. INJECTION   Yes Historical Provider, MD  diazepam (VALIUM) 5 MG tablet Take 1 tablet (5 mg total) by mouth at bedtime as needed for anxiety or muscle spasms. 09/29/15  Yes Melony Overly, MD  Insulin Degludec (TRESIBA FLEXTOUCH) 100 UNIT/ML SOPN Inject 20 Units into the skin daily.   Yes Historical Provider, MD  Liraglutide (VICTOZA) 18 MG/3ML SOPN Inject 18 mg into the skin daily. Reported on 10/11/2015   Yes Historical Provider, MD  traMADol (ULTRAM) 50 MG tablet Take 50 mg by mouth every 6 (six) hours as needed for moderate pain or severe pain.    Yes Historical Provider, MD  cyclobenzaprine (FLEXERIL) 10 MG tablet Take 1 tablet (10 mg total) by mouth 3 (three) times daily as needed for muscle  spasms. Patient not taking: Reported on 10/11/2015 12/04/14   Jola Schmidt, MD  ergocalciferol (VITAMIN D2) 50000 UNITS capsule Take 50,000 Units by mouth once a week.    Historical Provider, MD  fluconazole (DIFLUCAN) 150 MG tablet Take 1 tablet (150 mg total) by mouth once. Patient not taking: Reported on 10/11/2015 07/22/15   Orlie Dakin, MD  glucose blood test strip 1 each by Other route as needed for other. Use as instructed 09/29/15   Melony Overly, MD  ibuprofen (ADVIL,MOTRIN) 600 MG tablet Take 1 tablet (600 mg total) by mouth every 6 (six) hours as needed. Patient not taking: Reported on 10/11/2015 01/01/15   Crawford Givens, MD  Norgestimate-Ethinyl Estradiol Triphasic (TRI-SPRINTEC) 0.18/0.215/0.25 MG-35 MCG tablet Take 1 tablet by mouth daily. Patient not taking: Reported on 10/11/2015 01/01/15   Crawford Givens, MD  RABEprazole (ACIPHEX) 20 MG tablet Take 1 tablet (20 mg total) by mouth daily. Patient not taking: Reported on 10/11/2015 02/17/15   Margarita Mail, PA-C  valACYclovir (VALTREX) 500 MG tablet Take 1 tablet (500 mg total) by mouth daily. 04/18/12   [name], MD   BP 113/69 mmHg  Pulse 86  Temp(Src) 97.8 F (36.6 C) (Oral)  Resp 16  SpO2 100%  LMP 09/01/2015 Physical Exam  Constitutional: She is oriented to person, place, and time. She appears well-developed and well-nourished. No distress.  HENT:  Head: Normocephalic and atraumatic.  Eyes: Pupils are equal, round, and reactive to light.  Neck: Normal range of motion.  Cardiovascular: Normal rate and intact distal pulses.   Pulmonary/Chest: No respiratory distress.  Abdominal: Normal appearance. She exhibits no distension.  Musculoskeletal: Normal range of motion.  Neurological: She is alert and oriented to person, place, and time. No cranial nerve deficit.  Skin: Skin is warm and dry. No rash noted.  Psychiatric: She has a normal mood and affect. Her behavior is normal.  Nursing note and vitals reviewed.   ED Course   Procedures (including critical care time) Labs Review Labs Reviewed  COMPREHENSIVE METABOLIC PANEL - Abnormal; Notable for the following:    Sodium 132 (*)    Chloride 100 (*)    Glucose, Bld 375 (*)    Creatinine, Ser 1.04 (*)    Calcium 8.8 (*)    AST 11 (*)    ALT 12 (*)    All other components within normal limits  ACETAMINOPHEN LEVEL - Abnormal; Notable for the following:    Acetaminophen (Tylenol), Serum <10 (*)    All other components within normal limits  URINE RAPID DRUG SCREEN, HOSP PERFORMED -  Abnormal; Notable for the following:    Benzodiazepines POSITIVE (*)    All other components within normal limits  CBG MONITORING, ED - Abnormal; Notable for the following:    Glucose-Capillary 427 (*)    All other components within normal limits  CBG MONITORING, ED - Abnormal; Notable for the following:    Glucose-Capillary 356 (*)    All other components within normal limits  CBG MONITORING, ED - Abnormal; Notable for the following:    Glucose-Capillary 325 (*)    All other components within normal limits  CBG MONITORING, ED - Abnormal; Notable for the following:    Glucose-Capillary 246 (*)    All other components within normal limits  CBG MONITORING, ED - Abnormal; Notable for the following:    Glucose-Capillary 217 (*)    All other components within normal limits  ETHANOL  SALICYLATE LEVEL  CBC    Imaging Review No results found. I have personally reviewed and evaluated these images and lab results as part of my medical decision-making.    MDM   Final diagnoses:  Major depressive disorder, recurrent severe without psychotic features (HCC)        Leonard Schwartz, MD 10/14/15 [phone]
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
## 2015-10-11 NOTE — ED Notes (Signed)
TTS in progress at this time. Dr. Audie Pinto made aware pt cannot transfer to psych hold until glucose <300.

## 2015-10-11 NOTE — ED Notes (Addendum)
Pt reports her endocrinologist instructed her to come to the ED for medical clearance d/t depression.  Pt states "5 out of 7 days I feel like jumping off a bridge"  D/t fibromyalgia pain.  Pt also reports hyperglycemia.  States at times she is insulin resistant that her body is resistant to the insulin, she is also on PO DM meds.  Pt's husband states that he did not know that this is happening until today.  A week ago, she sort of mentioned something to him but he states that he just brushed it off.  He thinks that her depression has to do with their 48 yo daughter suddenly moving to [place] and telling them that she was getting married.  And thinks that she is not taking it well.

## 2015-10-12 ENCOUNTER — Inpatient Hospital Stay (HOSPITAL_COMMUNITY)
Admission: EM | Admit: 2015-10-12 | Discharge: 2015-10-18 | DRG: 885 | Disposition: A | Discharge status: Home / Self Care | Payer: BLUE CROSS/BLUE SHIELD | Source: Intra-hospital | Attending: Psychiatry | Admitting: Psychiatry

## 2015-10-12 ENCOUNTER — Encounter (HOSPITAL_COMMUNITY): Payer: Self-pay | Admitting: *Deleted

## 2015-10-12 DIAGNOSIS — Z808 Family history of malignant neoplasm of other organs or systems: Secondary | ICD-10-CM

## 2015-10-12 DIAGNOSIS — G8929 Other chronic pain: Secondary | ICD-10-CM | POA: Diagnosis present

## 2015-10-12 DIAGNOSIS — E785 Hyperlipidemia, unspecified: Secondary | ICD-10-CM | POA: Diagnosis present

## 2015-10-12 DIAGNOSIS — Z820 Family history of epilepsy and other diseases of the nervous system: Secondary | ICD-10-CM | POA: Diagnosis not present

## 2015-10-12 DIAGNOSIS — E669 Obesity, unspecified: Secondary | ICD-10-CM | POA: Diagnosis not present

## 2015-10-12 DIAGNOSIS — M542 Cervicalgia: Secondary | ICD-10-CM | POA: Diagnosis present

## 2015-10-12 DIAGNOSIS — R45851 Suicidal ideations: Secondary | ICD-10-CM | POA: Diagnosis present

## 2015-10-12 DIAGNOSIS — F332 Major depressive disorder, recurrent severe without psychotic features: Principal | ICD-10-CM | POA: Diagnosis present

## 2015-10-12 DIAGNOSIS — M549 Dorsalgia, unspecified: Secondary | ICD-10-CM | POA: Diagnosis present

## 2015-10-12 DIAGNOSIS — M797 Fibromyalgia: Secondary | ICD-10-CM | POA: Diagnosis not present

## 2015-10-12 DIAGNOSIS — Z794 Long term (current) use of insulin: Secondary | ICD-10-CM | POA: Diagnosis not present

## 2015-10-12 DIAGNOSIS — E7800 Pure hypercholesterolemia, unspecified: Secondary | ICD-10-CM | POA: Diagnosis not present

## 2015-10-12 DIAGNOSIS — M419 Scoliosis, unspecified: Secondary | ICD-10-CM | POA: Diagnosis not present

## 2015-10-12 DIAGNOSIS — Z833 Family history of diabetes mellitus: Secondary | ICD-10-CM

## 2015-10-12 DIAGNOSIS — Z8249 Family history of ischemic heart disease and other diseases of the circulatory system: Secondary | ICD-10-CM | POA: Diagnosis not present

## 2015-10-12 DIAGNOSIS — G4700 Insomnia, unspecified: Secondary | ICD-10-CM | POA: Diagnosis not present

## 2015-10-12 DIAGNOSIS — K219 Gastro-esophageal reflux disease without esophagitis: Secondary | ICD-10-CM | POA: Diagnosis present

## 2015-10-12 DIAGNOSIS — Z841 Family history of disorders of kidney and ureter: Secondary | ICD-10-CM | POA: Diagnosis not present

## 2015-10-12 DIAGNOSIS — D573 Sickle-cell trait: Secondary | ICD-10-CM | POA: Diagnosis present

## 2015-10-12 DIAGNOSIS — E119 Type 2 diabetes mellitus without complications: Secondary | ICD-10-CM | POA: Diagnosis present

## 2015-10-12 DIAGNOSIS — Z813 Family history of other psychoactive substance abuse and dependence: Secondary | ICD-10-CM

## 2015-10-12 DIAGNOSIS — F411 Generalized anxiety disorder: Secondary | ICD-10-CM | POA: Diagnosis present

## 2015-10-12 DIAGNOSIS — Z818 Family history of other mental and behavioral disorders: Secondary | ICD-10-CM

## 2015-10-12 LAB — CBG MONITORING, ED
GLUCOSE-CAPILLARY: 217 mg/dL — AB (ref 65–99)
GLUCOSE-CAPILLARY: 246 mg/dL — AB (ref 65–99)
GLUCOSE-CAPILLARY: 325 mg/dL — AB (ref 65–99)

## 2015-10-12 LAB — GLUCOSE, CAPILLARY
Glucose-Capillary: 281 mg/dL — ABNORMAL HIGH (ref 65–99)
Glucose-Capillary: 231 mg/dL — ABNORMAL HIGH (ref 65–99)

## 2015-10-12 MED ORDER — CYCLOBENZAPRINE HCL 10 MG PO TABS
10.0000 mg | ORAL_TABLET | Freq: Three times a day (TID) | ORAL | Status: DC | PRN
  Administered 2015-10-12 – 2015-10-17 (×10): 10 mg via ORAL
  Filled 2015-10-12 (×10): qty 1

## 2015-10-12 MED ORDER — TRAZODONE HCL 50 MG PO TABS
50.0000 mg | ORAL_TABLET | Freq: Every evening | ORAL | Status: DC | PRN
  Administered 2015-10-12 – 2015-10-13 (×4): 50 mg via ORAL
  Filled 2015-10-12 (×11): qty 1

## 2015-10-12 MED ORDER — INSULIN ASPART 100 UNIT/ML ~~LOC~~ SOLN
0.0000 [IU] | Freq: Every day | SUBCUTANEOUS | Status: DC
  Administered 2015-10-12 – 2015-10-13 (×2): 2 [IU] via SUBCUTANEOUS
  Administered 2015-10-14: 3 [IU] via SUBCUTANEOUS
  Administered 2015-10-15: 4 [IU] via SUBCUTANEOUS
  Administered 2015-10-16: 3 [IU] via SUBCUTANEOUS
  Administered 2015-10-17: 4 [IU] via SUBCUTANEOUS

## 2015-10-12 MED ORDER — ACETAMINOPHEN 325 MG PO TABS: 650.0000 mg | ORAL_TABLET | Freq: Four times a day (QID) | ORAL | Status: DC | PRN

## 2015-10-12 MED ORDER — INSULIN GLARGINE 100 UNIT/ML ~~LOC~~ SOLN
20.0000 [IU] | Freq: Every day | SUBCUTANEOUS | Status: DC
  Administered 2015-10-12 – 2015-10-14 (×3): 20 [IU] via SUBCUTANEOUS

## 2015-10-12 MED ORDER — IBUPROFEN 600 MG PO TABS
600.0000 mg | ORAL_TABLET | Freq: Four times a day (QID) | ORAL | Status: DC | PRN
  Administered 2015-10-12 – 2015-10-17 (×4): 600 mg via ORAL
  Filled 2015-10-12 (×4): qty 1

## 2015-10-12 MED ORDER — INSULIN ASPART 100 UNIT/ML ~~LOC~~ SOLN
0.0000 [IU] | Freq: Three times a day (TID) | SUBCUTANEOUS | Status: DC
  Administered 2015-10-12 – 2015-10-13 (×2): 8 [IU] via SUBCUTANEOUS
  Administered 2015-10-13: 5 [IU] via SUBCUTANEOUS
  Administered 2015-10-13 – 2015-10-14 (×2): 8 [IU] via SUBCUTANEOUS
  Administered 2015-10-14: 3 [IU] via SUBCUTANEOUS
  Administered 2015-10-14: 5 [IU] via SUBCUTANEOUS
  Administered 2015-10-15: 15 [IU] via SUBCUTANEOUS
  Administered 2015-10-15: 8 [IU] via SUBCUTANEOUS
  Administered 2015-10-15: 5 [IU] via SUBCUTANEOUS
  Administered 2015-10-16: 8 [IU] via SUBCUTANEOUS
  Administered 2015-10-16: 5 [IU] via SUBCUTANEOUS
  Administered 2015-10-17: 15 [IU] via SUBCUTANEOUS
  Administered 2015-10-17 (×2): 8 [IU] via SUBCUTANEOUS
  Administered 2015-10-18: 5 [IU] via SUBCUTANEOUS
  Administered 2015-10-18: 8 [IU] via SUBCUTANEOUS

## 2015-10-12 MED ORDER — ALUM & MAG HYDROXIDE-SIMETH 200-200-20 MG/5ML PO SUSP: 30.0000 mL | ORAL | Status: DC | PRN

## 2015-10-12 MED ORDER — MAGNESIUM HYDROXIDE 400 MG/5ML PO SUSP: 30.0000 mL | Freq: Every day | ORAL | Status: DC | PRN

## 2015-10-12 NOTE — Progress Notes (Deleted)
Recreation Therapy Notes  Animal-Assisted Activity (AAA) Program Checklist/Progress Notes Patient Eligibility Criteria Checklist & Daily Group note for Rec Tx Intervention  Date: 06.13.2017 Time: 2:45pm Location: [address]    AAA/T Program Assumption of Risk Form signed by Patient/ or Parent Legal Guardian Yes  Patient is free of allergies or sever asthma Yes  Patient reports no fear of animals Yes  Patient reports no history of cruelty to animals Yes  Patient understands his/her participation is voluntary Yes  Behavioral Response: Did not attend.   Laureen Ochs Joni Colegrove, LRT/CTRS      Elnita Surprenant L 10/12/2015 3:07 PM

## 2015-10-12 NOTE — ED Notes (Addendum)
Patient reports that she has chronic pain and that pain sometimes make her feel like jumping off the bridge and killing herself. Patient reports an increase in depression since her daughter has moved. Patient denies HI and AVH at his time. Plan of care discussed with patient. Patient voices no complaints or concerns at this time. Encouragement and support provided and safety maintain. Q 15 min safety checks in place.
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
## 2015-10-12 NOTE — Tx Team (Signed)
Initial Interdisciplinary Treatment Plan   PATIENT STRESSORS: Financial difficulties Health problems Occupational concerns   PATIENT STRENGTHS: Ability for insight Average or above average intelligence Capable of independent living Communication skills Motivation for treatment/growth Supportive family/friends   PROBLEM LIST: Problem List/Patient Goals Date to be addressed Date deferred Reason deferred Estimated date of resolution  At risk for suicide 10/12/2015  10/12/2015   D/C  Depression 10/12/2015  10/12/2015   D/C  Grief 10/12/2015  10/12/2015   D/C  "Exhausted all the time" 10/12/2015  10/12/2015   D/C  "constant pain with no sleep" 10/12/2015  10/12/2015   D/C                           DISCHARGE CRITERIA:  Improved stabilization in mood, thinking, and/or behavior Medical problems require only outpatient monitoring Motivation to continue treatment in a less acute level of care Need for constant or close observation no longer present Reduction of life-threatening or endangering symptoms to within safe limits  PRELIMINARY DISCHARGE PLAN: Outpatient therapy Return to previous living arrangement Return to previous work or school arrangements  PATIENT/FAMIILY INVOLVEMENT: This treatment plan has been presented to and reviewed with the patient, Karen Stein.  The patient and family have been given the opportunity to ask questions and make suggestions.  Donne Hazel P 10/12/2015, 3:41 PM

## 2015-10-12 NOTE — BH Assessment (Signed)
[place] Assessment Progress Note  Per Patriciaann Clan, PA, this pt requires psychiatric hospitalization at this time. Pt has been assigned pt to Rm 401-2.  Pt has signed Voluntary Admission and Consent for Treatment, as well as Consent to Release Information to her husband and her PCP, and a notification call has been placed to the PCP.  Signed forms have been faxed to [facility].  Pt's nurse, Gerrit Friends, has been notified, and agrees to send original paperwork along with pt via [place] [place], and to call report to [phone].  Jalene Mullet, [place] Triage Specialist [phone]

## 2015-10-12 NOTE — BHH Group Notes (Signed)
[place] LCSW Group Therapy 10/12/2015 1:15 PM  Type of Therapy: Group Therapy- Feelings about Diagnosis  Pt did not attend, declined invitation.   Peri Maris, [place] 10/12/2015 4:54 PM

## 2015-10-12 NOTE — ED Notes (Signed)
Attempted to call report, per [place], pt not able to transfer to [facility] until glucose <350 x 24 hours per staff member at [facility]. Notified EDP Audie Pinto) of the same.

## 2015-10-12 NOTE — ED Notes (Signed)
Patient transferred to [facility].  Left the unit ambulatory with [employer].  All belongings given to the driver.  Fentanyl patch was discontinued and removed prior to her departure.

## 2015-10-12 NOTE — Progress Notes (Signed)
Inpatient Diabetes Program Recommendations  AACE/ADA: New Consensus Statement on Inpatient Glycemic Control (2015)  Target Ranges:  Prepandial:   less than 140 mg/dL      Peak postprandial:   less than 180 mg/dL (1-2 hours)      Critically ill patients:  140 - 180 mg/dL   Results for Karen Stein, Karen Stein (MRN [id number]) as of 10/12/2015 11:42  Ref. Range 10/11/2015 21:33 10/11/2015 22:53 10/12/2015 00:09 10/12/2015 01:58 10/12/2015 08:04  Glucose-Capillary Latest Ref Range: 65-99 mg/dL 427 (H) 356 (H) 325 (H) 246 (H) 217 (H)    Admit with: Depression/ Hyperglycemia  History: DM  Home DM Meds: Tresiba Insulin- 20 units daily       Invokana 300 mg daily       Victoza 1.8 mg daily  Current Insulin Orders: Lantus 20 units daily      -Note patient waiting for medical clearance to go to [place].  Per notes, CBGs have to be less than 350 mg/dl X 24 hours before patient can be transferred.  -Lantus started this AM (home dose).  1st dose given at 9:43am today.     MD- Please consider placing orders for Novolog Moderate Correction Scale/ SSI (0-15 units) TID AC + HS  (Use Glycemic Control Order set)     --Will follow patient during hospitalization--  Wyn Quaker RN, MSN, CDE Diabetes Coordinator Inpatient Glycemic Control Team Team Pager: [phone] (8a-5p)

## 2015-10-13 ENCOUNTER — Encounter (HOSPITAL_COMMUNITY): Payer: Self-pay | Admitting: Psychiatry

## 2015-10-13 DIAGNOSIS — R45851 Suicidal ideations: Secondary | ICD-10-CM

## 2015-10-13 LAB — GLUCOSE, CAPILLARY
GLUCOSE-CAPILLARY: 279 mg/dL — AB (ref 65–99)
Glucose-Capillary: 298 mg/dL — ABNORMAL HIGH (ref 65–99)
Glucose-Capillary: 229 mg/dL — ABNORMAL HIGH (ref 65–99)

## 2015-10-13 MED ORDER — HYDROXYZINE HCL 50 MG PO TABS: 50.0000 mg | ORAL_TABLET | Freq: Four times a day (QID) | ORAL | Status: DC | PRN

## 2015-10-13 MED ORDER — DULOXETINE HCL 30 MG PO CPEP
30.0000 mg | ORAL_CAPSULE | Freq: Every day | ORAL | Status: DC
  Administered 2015-10-13 – 2015-10-14 (×2): 30 mg via ORAL
  Filled 2015-10-13 (×5): qty 1

## 2015-10-13 MED ORDER — MIRTAZAPINE 7.5 MG PO TABS
7.5000 mg | ORAL_TABLET | Freq: Every day | ORAL | Status: DC
  Administered 2015-10-13 – 2015-10-17 (×5): 7.5 mg via ORAL
  Filled 2015-10-13 (×6): qty 1

## 2015-10-13 NOTE — H&P (Signed)
Psychiatric Admission Assessment Adult  Patient Identification: ITA HOLTAN MRN:  [id number] Date of Evaluation:  10/13/2015 Chief Complaint:  MDD Principal Diagnosis: <principal problem not specified> Diagnosis:   Patient Active Problem List   Diagnosis Date Noted  . Major depressive disorder, recurrent severe without psychotic features ([place]) [F33.2] 10/12/2015  . Depression, major, severe recurrence ([place]) [F33.2] 10/12/2015  . Degenerative disc disease [IMO0002]   . Sickle cell trait ([place]) [D57.3]   . DDD (degenerative disc disease) [IMO0002]    [id number] Justine Null is 45 year old female who presents with worsening depression x 1 month and new onset of suicidal ideation x 2 months. She is married, with (1) daughter who is 6 years old and is expecting her first child any day now (DD 10/21/2015). 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 She lives with her husband, and aunt that was recently displaced from her home.   Chief Compliant: need help. Finances, lack of support from my husband " dynamics of the relationship have changed." Multiple losses in my family with no time to properly grieve, and then my husband lost his job of 27 years.   HPI:  Below information from behavioral health assessment has been reviewed by me and I agreed with the findings. JETAUN STIRN is an 45 y.o. female. Patient was at a scheduled doctor's office visit when she expressed suicidal thoughts with a plan to jump off the [employer] bridge. At that the practitioner encouraged the patient to come into the ED for a mental health crisis assessment. Patient continues to endorse suicidal thoughts with plan to jump off the [employer] bridge. She reports because of her chronic pain related to fibromyalgia and hx back surgeries she is unable to sleep for the past 6 months. Patient reports as a result of the chronic pain and lack of sleep she is making substantial errors at work  with the potential of losing her job, constantly irritable, having memory issues, fatigue, poor concentration, 5 out of 7 days suicidal thoughts with a plan, decreased hygiene, and recently forced to drop out of graduate school because of suffering academics. Patient reports other stressors are husband lost his job of 17 years in 01-15-15, father died a couple months ago and other death in the past 2 years, and her only daughter relocated to [place] expecting her first child. Patient currently denies HI, A/VH, and other self-injurious behaviors. Patient denies substance abuse current or past.   Drug related disorders:None  Legal History:None  Past Psychiatric History:MDD   Outpatient: none   Inpatient:[facility] in 1994, Depression   Past medication trial:Celexa, Lexapro, paxil   Past PH:[id number]  Medical Problems:DM, Obesity, HLD,   Allergies:shellfish and sulfa  Surgeries:Back surgery  Head trauma:None  [id number]   Family Psychiatric history:See below  Family Medical History:Diabetes, mental health, Drug addiction, scoliosis, arthritis, and dementia Associated Signs/Symptoms: Depression Symptoms:  depressed mood, anhedonia, insomnia, fatigue, feelings of worthlessness/guilt, difficulty concentrating, impaired memory, suicidal thoughts with specific plan, anxiety, loss of energy/fatigue, disturbed sleep, increased pain (Hypo) Manic Symptoms:  Irritable Mood, Anxiety Symptoms:  Excessive Worry, Psychotic Symptoms:  Denies PTSD Symptoms: Had a traumatic exposure:  father passed away. Written up at work for billing error Total Time spent with patient: 30 minutes   Is the patient at risk to self? Yes.    Has the patient been a risk to self in the past 6 months? Yes.    Has the patient been a  risk to self within the distant past? No.  Is the patient a risk to others? No.  Has the patient been a risk to others in the past 6 months? No.  Has the patient been a risk  to others within the distant past? No.   Alcohol Screening: 1. How often do you have a drink containing alcohol?: Never 9. Have you or someone else been injured as a result of your drinking?: No 10. Has a relative or friend or a doctor or another health worker been concerned about your drinking or suggested you cut down?: No Alcohol Use Disorder Identification Test Final Score (AUDIT): 0 Brief Intervention: AUDIT score less than 7 or less-screening does not suggest unhealthy drinking-brief intervention not indicated  Past Medical History:  Past Medical History  Diagnosis Date  . Arthritis   . Degenerative disc disease   . Degenerative disc disease   . Sickle cell trait ([place])   . DDD (degenerative disc disease)   . Anxiety   . Endometriosis   . Scoliosis   . DDD (degenerative disc disease)   . PCOS (polycystic ovarian syndrome)   . Fibroids   . High cholesterol   . Fibromyalgia   . Dysrhythmia     stress and caffeine related  . Leg cramps   . Insomnia   . Diabetes mellitus     Type 2 insulin resistant  . Anemia   . Complication of anesthesia     anesthesia awareness,  . GERD (gastroesophageal reflux disease)     Past Surgical History  Procedure Laterality Date  . Cesarean section    . Laparoscopy  2002 or 2003  . Wisdom tooth extraction    . Toe surgery  2008  . Biopsy breast    . Back surgery  2010  . Dilatation & curettage/hysteroscopy with myosure N/A 01/01/2015    Procedure: DILATATION & CURETTAGE/HYSTEROSCOPY WITH MYOSURE;  Surgeon: Crawford Givens, MD;  Location: [facility];  Service: Gynecology;  Laterality: N/A;   Family History:  Family History  Problem Relation Age of Onset  . Diabetes Paternal Grandfather   . Hypertension Paternal Grandfather   . Hyperlipidemia Paternal Grandfather   . Diabetes Paternal Grandmother   . Hypertension Paternal Grandmother   . Hyperlipidemia Paternal Grandmother   . Alzheimer's disease Maternal Grandmother   . Brain cancer  Maternal Grandfather   . Diabetes Father   . Hypertension Father   . Hyperlipidemia Father   . Kidney failure Father    Family Psychiatric  History:Maternal family- hx of bipolar schizophrenia, anxiety, and depression.  Tobacco Screening: None Social History:  History  Alcohol Use No    Comment: twice anually     History  Drug Use No    Additional Social History:    Allergies:   Allergies  Allergen Reactions  . Shellfish Allergy Swelling    Itching of lips and ears, NOT allergic to iodine contrast  . Sulfa Antibiotics Other (See Comments)    Dizzy and sees spots with sulfa drugs   Lab Results:  Results for orders placed or performed during the hospital encounter of 10/12/15 (from the past 48 hour(s))  Glucose, capillary     Status: Abnormal   Collection Time: 10/12/15  4:56 PM  Result Value Ref Range   Glucose-Capillary 281 (H) 65 - 99 mg/dL  Glucose, capillary     Status: Abnormal   Collection Time: 10/12/15  8:31 PM  Result Value Ref Range   Glucose-Capillary 231 (H) 65 -  99 mg/dL  Glucose, capillary     Status: Abnormal   Collection Time: 10/13/15  5:44 AM  Result Value Ref Range   Glucose-Capillary 298 (H) 65 - 99 mg/dL    Blood Alcohol level:  Lab Results  Component Value Date   ETH <5 [id number]    Metabolic Disorder Labs:  Lab Results  Component Value Date   HGBA1C * 04/14/2010    10.9 (NOTE)                                                                       According to the ADA Clinical Practice Recommendations for 2011, when HbA1c is used as a screening test:   >=6.5%   Diagnostic of Diabetes Mellitus           (if abnormal result  is confirmed)  5.7-6.4%   Increased risk of developing Diabetes Mellitus  References:Diagnosis and Classification of Diabetes Mellitus,Diabetes [id number] 1):S62-S69 and Standards of Medical Care in         Diabetes - 2011,Diabetes Care,2011,34  (Suppl 1):S11-S61.   MPG 266* 04/14/2010   Lab Results  Component  Value Date   PROLACTIN 6.7 07/22/2012   Lab Results  Component Value Date   CHOL * 04/14/2010    209        ATP III CLASSIFICATION:  <200     mg/dL   Desirable  200-239  mg/dL   Borderline High  >=240    mg/dL   High          TRIG 123 04/14/2010   HDL 35* 04/14/2010   CHOLHDL 6.0 04/14/2010   VLDL 25 04/14/2010   LDLCALC * 04/14/2010    149        Total Cholesterol/HDL:CHD Risk Coronary Heart Disease Risk Table                     Men   Women  1/2 Average Risk   3.4   3.3  Average Risk       5.0   4.4  2 X Average Risk   9.6   7.1  3 X Average Risk  23.4   11.0        Use the calculated Patient Ratio above and the CHD Risk Table to determine the patient's CHD Risk.        ATP III CLASSIFICATION (LDL):  <100     mg/dL   Optimal  100-129  mg/dL   Near or Above                    Optimal  130-159  mg/dL   Borderline  160-189  mg/dL   High  >190     mg/dL   Very High    Current Medications: Current Facility-Administered Medications  Medication Dose Route Frequency Provider Last Rate Last Dose  . acetaminophen (TYLENOL) tablet 650 mg  650 mg Oral Q6H PRN Patrecia Pour, NP      . alum & mag hydroxide-simeth (MAALOX/MYLANTA) 200-200-20 MG/5ML suspension 30 mL  30 mL Oral Q4H PRN Patrecia Pour, NP      . cyclobenzaprine (FLEXERIL) tablet 10 mg  10 mg Oral TID PRN Laverle Hobby, PA-C  10 mg at 10/13/15 0820  . DULoxetine (CYMBALTA) DR capsule 30 mg  30 mg Oral Daily Nanci Pina, FNP      . hydrOXYzine (ATARAX/VISTARIL) tablet 50 mg  50 mg Oral Q6H PRN Nanci Pina, FNP      . ibuprofen (ADVIL,MOTRIN) tablet 600 mg  600 mg Oral Q6H PRN Kerrie Buffalo, NP   600 mg at 10/13/15 0820  . insulin aspart (novoLOG) injection 0-15 Units  0-15 Units Subcutaneous TID WC Kerrie Buffalo, NP   8 Units at 10/13/15 [phone]  . insulin aspart (novoLOG) injection 0-5 Units  0-5 Units Subcutaneous QHS Kerrie Buffalo, NP   2 Units at 10/12/15 2107  . insulin glargine (LANTUS) injection 20  Units  20 Units Subcutaneous Daily Patrecia Pour, NP   20 Units at 10/13/15 0820  . magnesium hydroxide (MILK OF MAGNESIA) suspension 30 mL  30 mL Oral Daily PRN Patrecia Pour, NP      . mirtazapine (REMERON) tablet 7.5 mg  7.5 mg Oral QHS Nanci Pina, FNP      . traZODone (DESYREL) tablet 50 mg  50 mg Oral QHS,MR X 1 Laverle Hobby, PA-C   50 mg at 10/12/15 2304   PTA Medications: Prescriptions prior to admission  Medication Sig Dispense Refill Last Dose  . Canagliflozin (INVOKANA) 300 MG TABS Take 300 mg by mouth daily.    10/10/2015 at Unknown time  . Cyanocobalamin (VITAMIN B 12 PO) Take 1 each by mouth every 7 (seven) days. INJECTION   Past Month at Unknown time  . cyclobenzaprine (FLEXERIL) 10 MG tablet Take 1 tablet (10 mg total) by mouth 3 (three) times daily as needed for muscle spasms. (Patient not taking: Reported on 10/11/2015) 12 tablet 0 Completed Course at Unknown time  . diazepam (VALIUM) 5 MG tablet Take 1 tablet (5 mg total) by mouth at bedtime as needed for anxiety or muscle spasms. 15 tablet 0 10/10/2015 at Unknown time  . ergocalciferol (VITAMIN D2) 50000 UNITS capsule Take 50,000 Units by mouth once a week.   over 2 weehs  . fluconazole (DIFLUCAN) 150 MG tablet Take 1 tablet (150 mg total) by mouth once. (Patient not taking: Reported on 10/11/2015) 1 tablet 1 Completed Course at Unknown time  . glucose blood test strip 1 each by Other route as needed for other. Use as instructed 100 each 0   . ibuprofen (ADVIL,MOTRIN) 600 MG tablet Take 1 tablet (600 mg total) by mouth every 6 (six) hours as needed. (Patient not taking: Reported on 10/11/2015) 30 tablet 0 Completed Course at Unknown time  . Insulin Degludec (TRESIBA FLEXTOUCH) 100 UNIT/ML SOPN Inject 20 Units into the skin daily.   10/10/2015 at Unknown time  . Liraglutide (VICTOZA) 18 MG/3ML SOPN Inject 18 mg into the skin daily. Reported on 10/11/2015   10/10/2015 at Unknown time  . Norgestimate-Ethinyl Estradiol Triphasic  (TRI-SPRINTEC) 0.18/0.215/0.25 MG-35 MCG tablet Take 1 tablet by mouth daily. (Patient not taking: Reported on 10/11/2015) 1 Package 11 Completed Course at Unknown time  . RABEprazole (ACIPHEX) 20 MG tablet Take 1 tablet (20 mg total) by mouth daily. (Patient not taking: Reported on 10/11/2015) 30 tablet 0 Completed Course at Unknown time  . traMADol (ULTRAM) 50 MG tablet Take 50 mg by mouth every 6 (six) hours as needed for moderate pain or severe pain.    10/10/2015 at Unknown time  . valACYclovir (VALTREX) 500 MG tablet Take 1 tablet (500 mg total) by mouth daily.  30 tablet 11 over 2 months at unknown time    Musculoskeletal: Strength & Muscle Tone: within normal limits Gait & Station: normal Patient leans: N/A  Psychiatric Specialty Exam: Physical Exam  Review of Systems  Constitutional: Positive for malaise/fatigue.  Musculoskeletal: Positive for myalgias, back pain and joint pain.  Psychiatric/Behavioral: Positive for depression, suicidal ideas and memory loss (impaired memory, forgetfulness). Negative for hallucinations and substance abuse. The patient is nervous/anxious and has insomnia.     Blood pressure 106/72, pulse 101, temperature 98 F (36.7 C), temperature source Oral, resp. rate 18, height 5\' 8"  (1.727 m), weight 98.431 kg (217 lb), last menstrual period 09/01/2015.Body mass index is 33 kg/(m^2).  General Appearance: Fairly Groomed and in paper scrubs  Eye Contact:  Minimal  Speech:  Clear and Coherent and Normal Rate  Volume:  Normal  Mood:  Anxious, Depressed and Hopeless  Affect:  Constricted, Depressed and Flat  Thought Process:  Coherent and Linear  Orientation:  Full (Time, Place, and Person)  Thought Content:  WDL  Suicidal Thoughts:  Yes.  with intent/plan  Homicidal Thoughts:  No  Memory:  Immediate;   Good Recent;   Fair Remote;   Poor  Judgement:  Impaired  Insight:  Lacking  Psychomotor Activity:  Decreased  Concentration:  Concentration: Good  Recall:   [place] of Knowledge:  Good  Language:  Good  Akathisia:  No  Handed:  Right  AIMS (if indicated):     Assets:  Communication Skills Desire for Improvement Financial Resources/Insurance Physical Health Social Support Vocational/Educational  ADL's:  Intact  Cognition:  WNL  Sleep:  Number of Hours: 4   Treatment Plan Summary: Daily contact with patient to assess and evaluate symptoms and progress in treatment, Medication management and Plan Will start mirtazpine 7.5mg  po qhs for insomnia and depression. Will start Cymbalta 30mg  po daily for fibromyalgia pain, and depression.  1 Admit for crisis management and stabilization.  2. Medication management to reduce symptoms to baseline and improved the patient's overall level of functioning. Closely monitor the side effects, efficacy and therapeutic response of medication.  3. Treat health problem as indicated.  4. Developed treatment plan to decrease the risk of relapse upon discharge and to reduce the need for readmission.  5. Psychosocial education regarding relapse prevention in self-care.  6. Healthcare followup as needed for medical problems and called consults as indicated.  7. Increase collateral information.  8. Restart home medication where appropriate  9. Encouraged to participate and verbalize into group milieu therapy.   Observation Level/Precautions:  15 minute checks  Laboratory:  Labs reviewed in the ED have been assessed.   Psychotherapy: individual and group therapy   Medications:  Cymbalta, mirtazapine, trazodone  Consultations:  Per need, Endocrinology pending review of blood glucose and insulin levels  Discharge Concerns:  Medication compliance and stress management  Estimated LOS: 3-5 days  Other:     I certify that inpatient services furnished can reasonably be expected to improve the patient's condition.    Nanci Pina, FNP 6/14/201711:51 AM Patient case reviewed with NP and patient seen by me  Agree  with NP note and assessment as above  Patient is a 45 year old married female, who reports worsening depression, anxiety, insomnia, with recent suicidal ideations of " jumping ". She attributes her depression and anxiety at least partially to chronic pain, discomfort, associated with history of fibromyalgia, which has tended to worsen recently. She states that she has been  taking Norco, Tramadol 2-3 times per week to address pain, but denies any pattern of abuse, misuse. Medical history remarkable for hypercholesterolemia, Asthma, Fibromyalgia, DDD. Psychiatric History- one prior psychiatric admission in 89, history of depression, no history of mania, no history of psychosis, no history of suicide attempts , denies drug or alcohol abuse  Dx- Major Depression , Recurrent, no Psychotic Features Plan- inpatient admission . We discussed options, agrees to Cymbalta trial, and Remeron as antidepressant augmentation and to help insomnia- side effects reviewed .

## 2015-10-13 NOTE — Progress Notes (Signed)
D: Pt presents with depressed affect and mood.  She has isolated to her room for the majority of the night.  When asked about her day, pt states "I just stayed in the room to myself."  Pt states "I don't want to interact with anybody.  I'm still in a lot of pain.  I have fibromyalgia."  Pt requested medication for sleep.  She reports generalized pain of 9/10.  Pt denies SI/HI, denies hallucinations.    A: Introduced self to pt.  Actively listened to pt and offered support and encouragement.  On-site provider contacted for orders.  Medication administered per order.  PRN medication administered for sleep, pain, and muscle spasms.    R: Pt is compliant with medications.  She verbally contracts for safety.  Will continue to monitor and assess.

## 2015-10-13 NOTE — [facility] Group Notes (Signed)
[facility] LCSW Group Therapy 10/13/2015  1:15 PM   Type of Therapy: Group Therapy  Participation Level: Did Not Attend. Patient invited to participate but declined.   Tilden Fossa, MSW, [place] Clinical Social Worker [facility] [phone]

## 2015-10-13 NOTE — [facility] Group Notes (Signed)
Patient did not attend group.

## 2015-10-13 NOTE — Progress Notes (Signed)
Recreation Therapy Notes  Date: 06.14.2017 Time: 9:30am Location: 300 [address] Group Room   Group Topic: Stress Management  Goal Area(s) Addresses:  Patient will actively participate in stress management techniques presented during session.   Behavioral Response: Did not attend.   Laureen Ochs Deepti Gunawan, LRT/CTRS        Khadijatou Borak L 10/13/2015 1:46 PM

## 2015-10-13 NOTE — Progress Notes (Signed)
Patient ID: Karen Stein, female   DOB: 1970-08-02, 45 y.o.   MRN: [id number]   Pt currently presents with a flat affect and depressed behavior. Per self inventory, pt rates depression at a 8, hopelessness 8 and anxiety 10. Pt's daily goal is to "releiving pain and sleeping" and they intend to do so by "not sure because meds aren't helping me." Pt reports poor sleep, a good appetite, low energy and poor concentration. Pt remains in bed for most of the day today. Reports dizziness and fatigue when standing in hallways.   Pt provided with medications per providers orders. Pt's labs, CBGs and vitals were monitored throughout the day. Pt supported emotionally and encouraged to express concerns and questions. Pt educated on medications. Md notified of pt symptoms, wheelchair care ordered.   Pt's safety ensured with 15 minute and environmental checks. Pt currently denies SI/HI and A/V hallucinations. Pt verbally agrees to seek staff if SI/HI or A/VH occurs and to consult with staff before acting on any harmful thoughts. Pt writes "I do not want to attend group sessions and it is hard to rest with people near the phone outside of my door talking and laughing loudly. Will continue POC.

## 2015-10-13 NOTE — [facility] Group Notes (Signed)
[facility] LCSW Aftercare Discharge Planning Group Note  10/13/2015  8:45 AM  Participation Quality: Did Not Attend. Patient invited to participate but declined.  Tilden Fossa, MSW, [place] Clinical Social Worker [facility] [phone]

## 2015-10-13 NOTE — Tx Team (Signed)
Interdisciplinary Treatment Plan Update (Adult) Date: 10/13/2015    Time Reviewed: 9:30 AM  Progress in Treatment: Attending groups: Continuing to assess, patient new to milieu Participating in groups: Continuing to assess, patient new to milieu Taking medication as prescribed: Yes Tolerating medication: Yes Family/Significant other contact made: No, CSW assessing for appropriate contacts Patient understands diagnosis: Yes Discussing patient identified problems/goals with staff: Yes Medical problems stabilized or resolved: Yes Denies suicidal/homicidal ideation: Yes Issues/concerns per patient self-inventory: Yes Other:  New problem(s) identified: N/A  Discharge Plan or Barriers: Home with outpatient services.  Reason for Continuation of Hospitalization:  Depression Anxiety Medication Stabilization   Comments: N/A  Estimated length of stay: 3-5 days    Patient is a 45 year old female admitted for SI with plan to jump off bridge. Pt presented to the hospital with chronic pain and inability to sleep, as well as depression. Pt reports primary trigger(s) for admission was chronic pain, inability to sleep, husband losing job, declining work Systems analyst, recent death of father. Patient will benefit from crisis stabilization, medication evaluation, group therapy and psycho education in addition to case management for discharge planning. At discharge, it is recommended that Pt remain compliant with established discharge plan and continued treatment.   Review of initial/current patient goals per problem list:  1. Goal(s): Patient will participate in aftercare plan   Met: Yes   Target date: 3-5 days post admission date   As evidenced by: Patient will participate within aftercare plan AEB aftercare provider and housing plan at discharge being identified.  6/14: Goal met. Patient plans to return home with outpatient services.    2. Goal (s): Patient will exhibit decreased  depressive symptoms and suicidal ideations.   Met: No   Target date: 3-5 days post admission date   As evidenced by: Patient will utilize self rating of depression at 3 or below and demonstrate decreased signs of depression or be deemed stable for discharge by MD.  6/14: Patient rates depression at 8.    3. Goal(s): Patient will demonstrate decreased signs and symptoms of anxiety.   Met: No   Target date: 3-5 days post admission date   As evidenced by: Patient will utilize self rating of anxiety at 3 or below and demonstrated decreased signs of anxiety, or be deemed stable for discharge by MD  6/14: Patient rates anxiety at 10.    Attendees:  Patient:    Family:    Physician: Dr. Parke Poisson, MD  10/13/2015   Nursing: Marcella Dubs, Mayra Neer, [address], [place], [place] 10/13/2015   Clinical Social Worker: Erasmo Downer Terril Amaro LCSW, Alvina Chou Smart LCSW 10/13/2015   Other:    Clinical: May Malachi Carl, NP 10/13/2015   Other:  10/13/2015   Other:              Scribe for Treatment Team:  Tilden Fossa, [place]

## 2015-10-13 NOTE — Plan of Care (Signed)
Problem: Medication: Goal: Compliance with prescribed medication regimen will improve Outcome: Progressing Pt has been compliant with medications tonight.    

## 2015-10-13 NOTE — BHH Suicide Risk Assessment (Signed)
[facility] Admission Suicide Risk Assessment   Nursing information obtained from:  Patient Demographic factors:  NA Current Mental Status:  Suicidal ideation indicated by patient, Suicide plan, Self-harm thoughts Loss Factors:  Loss of significant relationship, Decline in physical health, Financial problems / change in socioeconomic status Historical Factors:  Prior suicide attempts, Family history of suicide, Family history of mental illness or substance abuse Risk Reduction Factors:  Employed, Living with another person, especially a relative, Positive social support  Total Time spent with patient: 45 minutes Principal Problem: Depression  Diagnosis:   Patient Active Problem List   Diagnosis Date Noted  . Major depressive disorder, recurrent severe without psychotic features ([place]) [F33.2] 10/12/2015  . Depression, major, severe recurrence ([place]) [F33.2] 10/12/2015  . Degenerative disc disease [IMO0002]   . Sickle cell trait ([place]) [D57.3]   . DDD (degenerative disc disease) [IMO0002]      Continued Clinical Symptoms:  Alcohol Use Disorder Identification Test Final Score (AUDIT): 0 The "Alcohol Use Disorders Identification Test", Guidelines for Use in Primary Care, Second Edition.  World Pharmacologist [facility]). Score between 0-7:  no or low risk or alcohol related problems. Score between 8-15:  moderate risk of alcohol related problems. Score between 16-19:  high risk of alcohol related problems. Score 20 or above:  warrants further diagnostic evaluation for alcohol dependence and treatment.   CLINICAL FACTORS:  Patient is a 45 year old married  female, who reports  worsening depression, anxiety, insomnia, with recent suicidal ideations of " jumping ". She attributes her depression and anxiety at least partially to chronic pain, discomfort, associated with history of fibromyalgia, which has tended to worsen recently. She states that she has been taking Norco, Tramadol 2-3 times per week  to address pain, but denies any pattern of abuse, misuse. Medical history remarkable for hypercholesterolemia, Asthma, Fibromyalgia, DDD. Psychiatric History- one prior psychiatric admission in 89, history of depression, no history of mania, no history of psychosis, no history of suicide attempts , denies drug or alcohol abuse  Dx- Major Depression , Recurrent, no Psychotic Features Plan- inpatient admission . We discussed options, agrees to Cymbalta trial, and Remeron as antidepressant augmentation and to help insomnia- side effects reviewed .   Musculoskeletal: Strength & Muscle Tone: within normal limits Gait & Station: normal Patient leans: N/A  Psychiatric Specialty Exam: Physical Exam  ROS no headache, diffuse muscular, joint pains related to fibromyalgia, no shortness of breath, no vomiting   Blood pressure 106/72, pulse 101, temperature 98 F (36.7 C), temperature source Oral, resp. rate 18, height 5\' 8"  (1.727 m), weight 217 lb (98.431 kg), last menstrual period 09/01/2015.Body mass index is 33 kg/(m^2).  General Appearance: Fairly Groomed  Eye Contact:  Good  Speech:  Normal Rate  Volume:  Decreased  Mood:  Depressed  Affect:  Constricted  Thought Process:  Linear  Orientation:  Full (Time, Place, and Person)  Thought Content:  no hallucinations, no delusions   Suicidal Thoughts:  No- at this time denies suicidal ideations and is able to contract for safety   Homicidal Thoughts:  No- denies any homicidal ideations   Memory:  recent and remote grossly intact   Judgement:  Fair  Insight:  Fair  Psychomotor Activity:  Decreased  Concentration:  Concentration: Good and Attention Span: Good  Recall:  Good  Fund of Knowledge:  Good  Language:  Good  Akathisia:  Negative  Handed:  Right  AIMS (if indicated):     Assets:  Desire for  Improvement Resilience  ADL's:  Intact  Cognition:  WNL  Sleep:  Number of Hours: 4      COGNITIVE FEATURES THAT CONTRIBUTE TO RISK:   Closed-mindedness and Loss of executive function    SUICIDE RISK:   Moderate:  Frequent suicidal ideation with limited intensity, and duration, some specificity in terms of plans, no associated intent, good self-control, limited dysphoria/symptomatology, some risk factors present, and identifiable protective factors, including available and accessible social support.  PLAN OF CARE: Patient will be admitted to inpatient psychiatric unit for stabilization and safety. Will provide and encourage milieu participation. Provide medication management and maked adjustments as needed.  Will follow daily.    I certify that inpatient services furnished can reasonably be expected to improve the patient's condition.   Neita Garnet, MD 10/13/2015, 6:30 PM

## 2015-10-14 DIAGNOSIS — F332 Major depressive disorder, recurrent severe without psychotic features: Secondary | ICD-10-CM | POA: Insufficient documentation

## 2015-10-14 LAB — GLUCOSE, CAPILLARY
GLUCOSE-CAPILLARY: 225 mg/dL — AB (ref 65–99)
GLUCOSE-CAPILLARY: 251 mg/dL — AB (ref 65–99)
GLUCOSE-CAPILLARY: 275 mg/dL — AB (ref 65–99)
Glucose-Capillary: 184 mg/dL — ABNORMAL HIGH (ref 65–99)
Glucose-Capillary: 291 mg/dL — ABNORMAL HIGH (ref 65–99)
Glucose-Capillary: 229 mg/dL — ABNORMAL HIGH (ref 65–99)

## 2015-10-14 MED ORDER — HYDROXYZINE HCL 25 MG PO TABS
25.0000 mg | ORAL_TABLET | Freq: Four times a day (QID) | ORAL | Status: DC | PRN
  Administered 2015-10-14 – 2015-10-17 (×3): 25 mg via ORAL
  Filled 2015-10-14 (×3): qty 1

## 2015-10-14 MED ORDER — INSULIN GLARGINE 100 UNIT/ML ~~LOC~~ SOLN
24.0000 [IU] | Freq: Every day | SUBCUTANEOUS | Status: DC
  Administered 2015-10-15 – 2015-10-18 (×5): 24 [IU] via SUBCUTANEOUS

## 2015-10-14 MED ORDER — OXYCODONE HCL 5 MG PO TABS
2.5000 mg | ORAL_TABLET | Freq: Three times a day (TID) | ORAL | Status: DC | PRN
  Administered 2015-10-16 – 2015-10-18 (×2): 2.5 mg via ORAL
  Filled 2015-10-14 (×2): qty 1

## 2015-10-14 MED ORDER — OXYCODONE-ACETAMINOPHEN 7.5-325 MG PO TABS: 1.0000 | ORAL_TABLET | Freq: Three times a day (TID) | ORAL | Status: DC | PRN

## 2015-10-14 MED ORDER — GABAPENTIN 100 MG PO CAPS
100.0000 mg | ORAL_CAPSULE | Freq: Three times a day (TID) | ORAL | Status: DC
  Administered 2015-10-14 – 2015-10-15 (×3): 100 mg via ORAL
  Filled 2015-10-14 (×8): qty 1

## 2015-10-14 MED ORDER — OXYCODONE-ACETAMINOPHEN 5-325 MG PO TABS
1.0000 | ORAL_TABLET | Freq: Three times a day (TID) | ORAL | Status: DC | PRN
  Administered 2015-10-16 – 2015-10-18 (×2): 1 via ORAL
  Filled 2015-10-14 (×2): qty 1

## 2015-10-14 MED ORDER — DULOXETINE HCL 20 MG PO CPEP
40.0000 mg | ORAL_CAPSULE | Freq: Every day | ORAL | Status: DC
  Administered 2015-10-15: 40 mg via ORAL
  Filled 2015-10-14 (×3): qty 2

## 2015-10-14 NOTE — Progress Notes (Addendum)
[facility] MD Progress Note  10/14/2015 1:42 PM Karen Stein  MRN:  [id number] Subjective:  Patient reports she is still feeling depressed, and reports some increased pain today, which she attributes to sleeping poorly last  Night. Denies medication side effects at this time. Objective : I have discussed case with treatment team and have met with patient She reports ongoing depression, sadness, but denies suicidal ideations . She continues to report chronic pain - at this time not in any acute distress or discomfort. Attributes pain mostly to fibromyalgia. She denies medication side effects at this time. We have discussed medication strategies/ options- she has agreed to Cymbalta trial - in the past she was also on Neurontin- does not remember max dose she was on, but does not remember it as particularly effective. However, she states she is willing to try it again and more hopeful it will work well for her at this time, in conjunction with the Cymbalta, to address her chronic pain and anxiety. No disruptive or agitated behaviors on unit . Going to some groups   Principal Problem: Depression, major, severe recurrence ([place]) Diagnosis:   Patient Active Problem List   Diagnosis Date Noted  . Major depressive disorder, recurrent severe without psychotic features ([place]) [F33.2] 10/12/2015  . Depression, major, severe recurrence ([place]) [F33.2] 10/12/2015  . Degenerative disc disease [IMO0002]   . Sickle cell trait ([place]) [D57.3]   . DDD (degenerative disc disease) [IMO0002]    Total Time spent with patient: 20 minutes    Past Medical History:  Past Medical History  Diagnosis Date  . Arthritis   . Degenerative disc disease   . Degenerative disc disease   . Sickle cell trait ([place])   . DDD (degenerative disc disease)   . Anxiety   . Endometriosis   . Scoliosis   . DDD (degenerative disc disease)   . PCOS (polycystic ovarian syndrome)   . Fibroids   . High cholesterol   . Fibromyalgia    . Dysrhythmia     stress and caffeine related  . Leg cramps   . Insomnia   . Diabetes mellitus     Type 2 insulin resistant  . Anemia   . Complication of anesthesia     anesthesia awareness,  . GERD (gastroesophageal reflux disease)     Past Surgical History  Procedure Laterality Date  . Cesarean section    . Laparoscopy  2002 or 2003  . Wisdom tooth extraction    . Toe surgery  2008  . Biopsy breast    . Back surgery  2010  . Dilatation & curettage/hysteroscopy with myosure N/A 01/01/2015    Procedure: DILATATION & CURETTAGE/HYSTEROSCOPY WITH MYOSURE;  Surgeon: Crawford Givens, MD;  Location: [facility];  Service: Gynecology;  Laterality: N/A;   Family History:  Family History  Problem Relation Age of Onset  . Diabetes Paternal Grandfather   . Hypertension Paternal Grandfather   . Hyperlipidemia Paternal Grandfather   . Diabetes Paternal Grandmother   . Hypertension Paternal Grandmother   . Hyperlipidemia Paternal Grandmother   . Alzheimer's disease Maternal Grandmother   . Brain cancer Maternal Grandfather   . Diabetes Father   . Hypertension Father   . Hyperlipidemia Father   . Kidney failure Father     Social History:  History  Alcohol Use No    Comment: twice anually     History  Drug Use No    Social History   Social History  . Marital Status: Married  Spouse Name: N/A  . Number of Children: N/A  . Years of Education: N/A   Social History Main Topics  . Smoking status: Never Smoker   . Smokeless tobacco: Never Used  . Alcohol Use: No     Comment: twice anually  . Drug Use: No  . Sexual Activity: Yes    Birth Control/ Protection: None   Other Topics Concern  . None   Social History Narrative   Additional Social History:   Sleep: Fair  Appetite:  Fair  Current Medications: Current Facility-Administered Medications  Medication Dose Route Frequency Provider Last Rate Last Dose  . acetaminophen (TYLENOL) tablet 650 mg  650 mg Oral Q6H PRN  Patrecia Pour, NP      . alum & mag hydroxide-simeth (MAALOX/MYLANTA) 200-200-20 MG/5ML suspension 30 mL  30 mL Oral Q4H PRN Patrecia Pour, NP      . cyclobenzaprine (FLEXERIL) tablet 10 mg  10 mg Oral TID PRN Laverle Hobby, PA-C   10 mg at 10/13/15 2202  . DULoxetine (CYMBALTA) DR capsule 30 mg  30 mg Oral Daily Nanci Pina, FNP   30 mg at 10/13/15 1217  . hydrOXYzine (ATARAX/VISTARIL) tablet 50 mg  50 mg Oral Q6H PRN Nanci Pina, FNP      . ibuprofen (ADVIL,MOTRIN) tablet 600 mg  600 mg Oral Q6H PRN Kerrie Buffalo, NP   600 mg at 10/13/15 0820  . insulin aspart (novoLOG) injection 0-15 Units  0-15 Units Subcutaneous TID WC Kerrie Buffalo, NP   3 Units at 10/14/15 0640  . insulin aspart (novoLOG) injection 0-5 Units  0-5 Units Subcutaneous QHS Kerrie Buffalo, NP   2 Units at 10/13/15 2200  . insulin glargine (LANTUS) injection 20 Units  20 Units Subcutaneous Daily Patrecia Pour, NP   20 Units at 10/13/15 0820  . magnesium hydroxide (MILK OF MAGNESIA) suspension 30 mL  30 mL Oral Daily PRN Patrecia Pour, NP      . mirtazapine (REMERON) tablet 7.5 mg  7.5 mg Oral QHS Nanci Pina, FNP   7.5 mg at 10/13/15 2200  . traZODone (DESYREL) tablet 50 mg  50 mg Oral QHS,MR X 1 Spencer E Simon, PA-C   50 mg at 10/13/15 2300    Lab Results:  Results for orders placed or performed during the hospital encounter of 10/12/15 (from the past 48 hour(s))  Glucose, capillary     Status: Abnormal   Collection Time: 10/12/15  4:56 PM  Result Value Ref Range   Glucose-Capillary 281 (H) 65 - 99 mg/dL  Glucose, capillary     Status: Abnormal   Collection Time: 10/12/15  8:31 PM  Result Value Ref Range   Glucose-Capillary 231 (H) 65 - 99 mg/dL  Glucose, capillary     Status: Abnormal   Collection Time: 10/13/15  5:44 AM  Result Value Ref Range   Glucose-Capillary 298 (H) 65 - 99 mg/dL  Glucose, capillary     Status: Abnormal   Collection Time: 10/13/15 12:10 PM  Result Value Ref Range    Glucose-Capillary 229 (H) 65 - 99 mg/dL  Glucose, capillary     Status: Abnormal   Collection Time: 10/13/15  5:13 PM  Result Value Ref Range   Glucose-Capillary 279 (H) 65 - 99 mg/dL   Comment 1 Notify RN   Glucose, capillary     Status: Abnormal   Collection Time: 10/13/15  9:44 PM  Result Value Ref Range   Glucose-Capillary 225 (H)  65 - 99 mg/dL    Blood Alcohol level:  Lab Results  Component Value Date   ETH <5 10/11/2015    Physical Findings: AIMS: Facial and Oral Movements Muscles of Facial Expression: None, normal Lips and Perioral Area: None, normal Jaw: None, normal Tongue: None, normal,Extremity Movements Upper (arms, wrists, hands, fingers): None, normal Lower (legs, knees, ankles, toes): None, normal, Trunk Movements Neck, shoulders, hips: None, normal, Overall Severity Severity of abnormal movements (highest score from questions above): None, normal Incapacitation due to abnormal movements: None, normal Patient's awareness of abnormal movements (rate only patient's report): No Awareness, Dental Status Current problems with teeth and/or dentures?: No Does patient usually wear dentures?: No  CIWA:    COWS:     Musculoskeletal: Strength & Muscle Tone: within normal limits Gait & Station: normal Patient leans: N/A  Psychiatric Specialty Exam: Physical Exam  ROS chronic pain, some nausea, no vomiting   Blood pressure 127/78, pulse 109, temperature 98 F (36.7 C), temperature source Tympanic, resp. rate 20, height _0  (1.727 m), weight 217 lb (98.431 kg), last menstrual period 09/01/2015.Body mass index is 33 kg/(m^2).  General Appearance: Fairly Groomed  Eye Contact:  Good  Speech:  Normal Rate  Volume:  Decreased  Mood:  Depressed  Affect:  constricted, but a little more reactive today,smiles at times appropriately   Thought Process:  Linear  Orientation:  Full (Time, Place, and Person)  Thought Content:  denies hallucinations, no delusions   Suicidal  Thoughts:  No today denies any suicidal plan or intention and contracts for safety on the unit   Homicidal Thoughts:  No- denies any homicidal ideations   Memory:  recent and remote grossly intact   Judgement:  Other:  improving   Insight:  improving   Psychomotor Activity:  Decreased  Concentration:  Concentration: Good and Attention Span: Good  Recall:  Good  Fund of Knowledge:  Good  Language:  Good  Akathisia:  Negative  Handed:  Right  AIMS (if indicated):     Assets:  Communication Skills Desire for Improvement Resilience  ADL's:  Intact  Cognition:  WNL  Sleep:  Number of Hours: 4  Assessment- patient reports ongoing depression, sadness, but denies suicidal ideations at this time. Attributes depression in part to chronic pain associated with fibromyalgia. She is tolerating Cymbalta trial well thus far- in the past tried Neurontin, without side effects, and is amenable to try it again . Denies medication side effects at this time.    Treatment Plan Summary: Daily contact with patient to assess and evaluate symptoms and progress in treatment, Medication management, Plan inpatient treatment  and medications as below  Encourage ongoing group and milieu participation to work on coping skills and symptom reduction  Continue Cymbalta at 40 mgrs QDAY for depression Continue Remeron 7.5 mgrs for depression / insomnia  Start Neurontin at 100 mgrs TID  For anxiety, chronic pain Continue Vistaril 25 mgrs Q 6 hours PRN for anxiety as needed  As recommended by Diabetes Coordinator , increase Insulin Lantus to 24 units in AM Neita Garnet, MD 10/14/2015, 1:42 PM

## 2015-10-14 NOTE — Progress Notes (Signed)
Patient ID: Karen Stein, female   DOB: 1971/04/29, 45 y.o.   MRN: [id number]  DAR: Pt. Denies SI/HI and A/V Hallucinations. He reports sleep is poor, appetite is fair, energy level is low, and concentration is poor. He rates depression, anxiety, and hopelessness 7/10. Patient does report generalized pain, "due to my fibromyalgia." She received PRN medication but continues to report pain. Support and encouragement provided to the patient. Scheduled medications administered to patient per physician's orders. She is minimal and isolative at this time. She continues to use a wheelchair to ambulate. Q15 minute checks are maintained for safety.

## 2015-10-14 NOTE — BHH Suicide Risk Assessment (Signed)
[place] INPATIENT:  Family/Significant Other Suicide Prevention Education  Suicide Prevention Education:  Education Completed; husband Daisey Must [phone],  (name of family member/significant other) has been identified by the patient as the family member/significant other with whom the patient will be residing, and identified as the person(s) who will aid the patient in the event of a mental health crisis (suicidal ideations/suicide attempt).  With written consent from the patient, the family member/significant other has been provided the following suicide prevention education, prior to the and/or following the discharge of the patient.  The suicide prevention education provided includes the following:  Suicide risk factors  Suicide prevention and interventions  National Suicide Hotline telephone number  [facility] assessment telephone number  [facility] Emergency Assistance [place] and/or Residential Mobile Crisis Unit telephone number  Request made of family/significant other to:  Remove weapons (e.g., guns, rifles, knives), all items previously/currently identified as safety concern.    Remove drugs/medications (over-the-counter, prescriptions, illicit drugs), all items previously/currently identified as a safety concern.  The family member/significant other verbalizes understanding of the suicide prevention education information provided.  The family member/significant other agrees to remove the items of safety concern listed above.  Armentha Branagan, Casimiro Needle 10/14/2015, 4:55 PM
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
## 2015-10-14 NOTE — Progress Notes (Signed)
Patient did not attend wrap up group. 

## 2015-10-14 NOTE — BHH Group Notes (Signed)
[place] LCSW Group Therapy 10/14/2015 1:15 PM Type of Therapy: Group Therapy Participation Level: Active  Participation Quality: Attentive, Sharing and Supportive  Affect: Depressed and Flat  Cognitive: Alert and Oriented  Insight: Developing/Improving and Engaged  Engagement in Therapy: Developing/Improving and Engaged  Modes of Intervention: Activity, Clarification, Confrontation, Discussion, Education, Exploration, Limit-setting, Orientation, Problem-solving, Rapport Building, Art therapist, Socialization and Support  Summary of Progress/Problems: Patient was attentive and engaged with speaker from [place]. Patient was attentive to speaker while they shared their story of dealing with mental health and overcoming it. Patient expressed interest in their programs and services and received information on their agency. Patient processed ways they can relate to the speaker.   Tilden Fossa, LCSW Clinical Social Worker [facility] [phone]

## 2015-10-14 NOTE — Progress Notes (Signed)
Inpatient Diabetes Program Recommendations  AACE/ADA: New Consensus Statement on Inpatient Glycemic Control (2015)  Target Ranges:  Prepandial:   less than 140 mg/dL      Peak postprandial:   less than 180 mg/dL (1-2 hours)      Critically ill patients:  140 - 180 mg/dL   Results for CANSAS, ELL (MRN [id number]) as of 10/14/2015 10:38  Ref. Range 10/13/2015 05:44 10/13/2015 12:10 10/13/2015 17:13 10/13/2015 21:44  Glucose-Capillary Latest Ref Range: 65-99 mg/dL 298 (H) 229 (H) 279 (H) 225 (H)    Admit with: Depression/ Hyperglycemia  History: DM  Home DM Meds: Tresiba Insulin- 20 units daily  Invokana 300 mg daily  Victoza 1.8 mg daily  Current Insulin Orders: Lantus 20 units daily        Novolog Moderate Correction Scale/ SSI (0-15 units) TID AC + HS     MD- Please consider the following in-hospital insulin adjustments while home Invokana and Victoza are on hold:  1. Increase Lantus to 24 units daily (20% increase)  2. Start Novolog Meal Coverage- Novolog 4 units tidwc (hold if patient eats <50% of meal)- (Use Glycemic Control Order set)     --Will follow patient during hospitalization--  Wyn Quaker RN, MSN, CDE Diabetes Coordinator Inpatient Glycemic Control Team Team Pager: [phone] (8a-5p)

## 2015-10-15 LAB — GLUCOSE, CAPILLARY
GLUCOSE-CAPILLARY: 262 mg/dL — AB (ref 65–99)
Glucose-Capillary: 225 mg/dL — ABNORMAL HIGH (ref 65–99)
Glucose-Capillary: 389 mg/dL — ABNORMAL HIGH (ref 65–99)
Glucose-Capillary: 318 mg/dL — ABNORMAL HIGH (ref 65–99)

## 2015-10-15 MED ORDER — DULOXETINE HCL 60 MG PO CPEP
60.0000 mg | ORAL_CAPSULE | Freq: Every day | ORAL | Status: DC
  Administered 2015-10-16 – 2015-10-18 (×3): 60 mg via ORAL
  Filled 2015-10-15 (×4): qty 1

## 2015-10-15 MED ORDER — GABAPENTIN 100 MG PO CAPS
200.0000 mg | ORAL_CAPSULE | Freq: Three times a day (TID) | ORAL | Status: DC
  Administered 2015-10-15 – 2015-10-18 (×9): 200 mg via ORAL
  Filled 2015-10-15 (×11): qty 2

## 2015-10-15 NOTE — Progress Notes (Signed)
Recreation Therapy Notes  Date: 06.16.2017 Time: 9:30am Location: [address] Group Room   Group Topic: Stress Management  Goal Area(s) Addresses:  Patient will actively participate in stress management techniques presented during session.   Behavioral Response:  Attentive   Intervention: Stress management techniques  Activity : Deep Breathing and Guided Imagery. LRT provided education, instruction and demonstration on practice of  Deep Breathing and Guided Imagery. Patient was asked to participate in technique introduced during session.   Education:  Stress Management, Discharge Planning.   Education Outcome: Acknowledges education  Clinical Observations/Feedback: Patient arrived to group session as group was concluding. Patient shared she has fibromyalgia and she has significant difficulty sleeping. LRT encouraged patient to use aquatics as a means of alleviating her pain, as well as aromatherapy and to spend time with her dogs. Following group patient provided information on the [employer], including sliding scale financial assistance application and [employer] aromatherapy.   Laureen Ochs Khan Chura, LRT/CTRS        Yazaira Speas L 10/15/2015 10:24 AM
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
## 2015-10-15 NOTE — Progress Notes (Deleted)
[facility] MD Progress Note  10/15/2015 12:36 PM Karen Stein  MRN:  [id number] Subjective:  Patient reports she is still feeling depressed, and reports some increased pain today, which she attributes to sleeping poorly last  Night. Denies medication side effects at this time. Objective : I have discussed case with treatment team and have met with patient She reports ongoing depression, sadness, but denies suicidal ideations . She continues to report chronic pain - at this time not in any acute distress or discomfort. Attributes pain mostly to fibromyalgia. She denies medication side effects at this time. We have discussed medication strategies/ options- she has agreed to Cymbalta trial - in the past she was also on Neurontin- does not remember max dose she was on, but does not remember it as particularly effective. However, she states she is willing to try it again and more hopeful it will work well for her at this time, in conjunction with the Cymbalta, to address her chronic pain and anxiety. No disruptive or agitated behaviors on unit . Going to some groups   Principal Problem: Depression, major, severe recurrence ([place]) Diagnosis:   Patient Active Problem List   Diagnosis Date Noted  . Severe episode of recurrent major depressive disorder, without psychotic features ([place]) [F33.2]   . Major depressive disorder, recurrent severe without psychotic features ([place]) [F33.2] 10/12/2015  . Depression, major, severe recurrence ([place]) [F33.2] 10/12/2015  . Degenerative disc disease [IMO0002]   . Sickle cell trait ([place]) [D57.3]   . DDD (degenerative disc disease) [IMO0002]    Total Time spent with patient: 20 minutes    Past Medical History:  Past Medical History  Diagnosis Date  . Arthritis   . Degenerative disc disease   . Degenerative disc disease   . Sickle cell trait ([place])   . DDD (degenerative disc disease)   . Anxiety   . Endometriosis   . Scoliosis   . DDD (degenerative disc  disease)   . PCOS (polycystic ovarian syndrome)   . Fibroids   . High cholesterol   . Fibromyalgia   . Dysrhythmia     stress and caffeine related  . Leg cramps   . Insomnia   . Diabetes mellitus     Type 2 insulin resistant  . Anemia   . Complication of anesthesia     anesthesia awareness,  . GERD (gastroesophageal reflux disease)     Past Surgical History  Procedure Laterality Date  . Cesarean section    . Laparoscopy  2002 or 2003  . Wisdom tooth extraction    . Toe surgery  2008  . Biopsy breast    . Back surgery  2010  . Dilatation & curettage/hysteroscopy with myosure N/A 01/01/2015    Procedure: DILATATION & CURETTAGE/HYSTEROSCOPY WITH MYOSURE;  Surgeon: Crawford Givens, MD;  Location: [facility];  Service: Gynecology;  Laterality: N/A;   Family History:  Family History  Problem Relation Age of Onset  . Diabetes Paternal Grandfather   . Hypertension Paternal Grandfather   . Hyperlipidemia Paternal Grandfather   . Diabetes Paternal Grandmother   . Hypertension Paternal Grandmother   . Hyperlipidemia Paternal Grandmother   . Alzheimer's disease Maternal Grandmother   . Brain cancer Maternal Grandfather   . Diabetes Father   . Hypertension Father   . Hyperlipidemia Father   . Kidney failure Father     Social History:  History  Alcohol Use No    Comment: twice anually     History  Drug Use  No    Social History   Social History  . Marital Status: Married    Spouse Name: N/A  . Number of Children: N/A  . Years of Education: N/A   Social History Main Topics  . Smoking status: Never Smoker   . Smokeless tobacco: Never Used  . Alcohol Use: No     Comment: twice anually  . Drug Use: No  . Sexual Activity: Yes    Birth Control/ Protection: None   Other Topics Concern  . None   Social History Narrative   Additional Social History:   Sleep: Fair  Appetite:  Fair  Current Medications: Current Facility-Administered Medications  Medication Dose Route  Frequency Provider Last Rate Last Dose  . acetaminophen (TYLENOL) tablet 650 mg  650 mg Oral Q6H PRN Patrecia Pour, NP      . alum & mag hydroxide-simeth (MAALOX/MYLANTA) 200-200-20 MG/5ML suspension 30 mL  30 mL Oral Q4H PRN Patrecia Pour, NP      . cyclobenzaprine (FLEXERIL) tablet 10 mg  10 mg Oral TID PRN Laverle Hobby, PA-C   10 mg at 10/15/15 0906  . DULoxetine (CYMBALTA) DR capsule 40 mg  40 mg Oral Daily Jenne Campus, MD   40 mg at 10/15/15 0906  . gabapentin (NEURONTIN) capsule 100 mg  100 mg Oral TID Jenne Campus, MD   100 mg at 10/15/15 1213  . hydrOXYzine (ATARAX/VISTARIL) tablet 25 mg  25 mg Oral Q6H PRN Jenne Campus, MD   25 mg at 10/14/15 2107  . ibuprofen (ADVIL,MOTRIN) tablet 600 mg  600 mg Oral Q6H PRN Kerrie Buffalo, NP   600 mg at 10/13/15 0820  . insulin aspart (novoLOG) injection 0-15 Units  0-15 Units Subcutaneous TID WC Kerrie Buffalo, NP   5 Units at 10/15/15 1214  . insulin aspart (novoLOG) injection 0-5 Units  0-5 Units Subcutaneous QHS Kerrie Buffalo, NP   3 Units at 10/14/15 2109  . insulin glargine (LANTUS) injection 24 Units  24 Units Subcutaneous Daily Jenne Campus, MD   24 Units at 10/15/15 [phone]  . magnesium hydroxide (MILK OF MAGNESIA) suspension 30 mL  30 mL Oral Daily PRN Patrecia Pour, NP      . mirtazapine (REMERON) tablet 7.5 mg  7.5 mg Oral QHS Nanci Pina, FNP   7.5 mg at 10/14/15 2107  . oxyCODONE-acetaminophen (PERCOCET/ROXICET) 5-325 MG per tablet 1 tablet  1 tablet Oral Q8H PRN Jenne Campus, MD       And  . oxyCODONE (Oxy IR/ROXICODONE) immediate release tablet 2.5 mg  2.5 mg Oral Q8H PRN Jenne Campus, MD        Lab Results:  Results for orders placed or performed during the hospital encounter of 10/12/15 (from the past 48 hour(s))  Glucose, capillary     Status: Abnormal   Collection Time: 10/13/15  5:13 PM  Result Value Ref Range   Glucose-Capillary 279 (H) 65 - 99 mg/dL   Comment 1 Notify RN   Glucose, capillary      Status: Abnormal   Collection Time: 10/13/15  9:44 PM  Result Value Ref Range   Glucose-Capillary 225 (H) 65 - 99 mg/dL  Glucose, capillary     Status: Abnormal   Collection Time: 10/14/15  6:26 AM  Result Value Ref Range   Glucose-Capillary 184 (H) 65 - 99 mg/dL  Glucose, capillary     Status: Abnormal   Collection Time: 10/14/15  9:42 AM  Result Value Ref Range   Glucose-Capillary 291 (H) 65 - 99 mg/dL   Comment 1 Notify RN   Glucose, capillary     Status: Abnormal   Collection Time: 10/14/15 12:13 PM  Result Value Ref Range   Glucose-Capillary 251 (H) 65 - 99 mg/dL  Glucose, capillary     Status: Abnormal   Collection Time: 10/14/15  5:20 PM  Result Value Ref Range   Glucose-Capillary 229 (H) 65 - 99 mg/dL  Glucose, capillary     Status: Abnormal   Collection Time: 10/14/15  8:37 PM  Result Value Ref Range   Glucose-Capillary 275 (H) 65 - 99 mg/dL  Glucose, capillary     Status: Abnormal   Collection Time: 10/15/15  6:05 AM  Result Value Ref Range   Glucose-Capillary 262 (H) 65 - 99 mg/dL  Glucose, capillary     Status: Abnormal   Collection Time: 10/15/15 11:55 AM  Result Value Ref Range   Glucose-Capillary 225 (H) 65 - 99 mg/dL    Blood Alcohol level:  Lab Results  Component Value Date   ETH <5 10/11/2015    Physical Findings: AIMS: Facial and Oral Movements Muscles of Facial Expression: None, normal Lips and Perioral Area: None, normal Jaw: None, normal Tongue: None, normal,Extremity Movements Upper (arms, wrists, hands, fingers): None, normal Lower (legs, knees, ankles, toes): None, normal, Trunk Movements Neck, shoulders, hips: None, normal, Overall Severity Severity of abnormal movements (highest score from questions above): None, normal Incapacitation due to abnormal movements: None, normal Patient's awareness of abnormal movements (rate only patient's report): No Awareness, Dental Status Current problems with teeth and/or dentures?: No Does patient  usually wear dentures?: No  CIWA:    COWS:     Musculoskeletal: Strength & Muscle Tone: within normal limits Gait & Station: normal Patient leans: N/A  Psychiatric Specialty Exam: Physical Exam  ROS chronic pain, some nausea, no vomiting   Blood pressure 125/82, pulse 92, temperature 98.1 F (36.7 C), temperature source Oral, resp. rate 16, height 5' 8" (1.727 m), weight 98.431 kg (217 lb), last menstrual period 09/01/2015.Body mass index is 33 kg/(m^2).  General Appearance: Fairly Groomed  Eye Contact:  Good  Speech:  Normal Rate  Volume:  Decreased  Mood:  Depressed  Affect:  constricted, but a little more reactive today,smiles at times appropriately   Thought Process:  Linear  Orientation:  Full (Time, Place, and Person)  Thought Content:  denies hallucinations, no delusions   Suicidal Thoughts:  No today denies any suicidal plan or intention and contracts for safety on the unit   Homicidal Thoughts:  No- denies any homicidal ideations   Memory:  recent and remote grossly intact   Judgement:  Other:  improving   Insight:  improving   Psychomotor Activity:  Decreased  Concentration:  Concentration: Good and Attention Span: Good  Recall:  Good  Fund of Knowledge:  Good  Language:  Good  Akathisia:  Negative  Handed:  Right  AIMS (if indicated):     Assets:  Communication Skills Desire for Improvement Resilience  ADL's:  Intact  Cognition:  WNL  Sleep:  Number of Hours: 6.5  Assessment- patient reports ongoing depression, sadness, but denies suicidal ideations at this time. Attributes depression in part to chronic pain associated with fibromyalgia. She is tolerating Cymbalta trial well thus far- in the past tried Neurontin, without side effects, and is amenable to try it again . Denies medication side effects at this time.    Treatment  Plan Summary: Daily contact with patient to assess and evaluate symptoms and progress in treatment, Medication management, Plan  inpatient treatment  and medications as below  Encourage ongoing group and milieu participation to work on coping skills and symptom reduction  Continue Cymbalta at 40 mgrs QDAY for depression Continue Remeron 7.5 mgrs for depression / insomnia  Start Neurontin at 100 mgrs TID  For anxiety, chronic pain Continue Vistaril 25 mgrs Q 6 hours PRN for anxiety as needed  As recommended by Diabetes Coordinator , increase Insulin Lantus to 24 units in AM Derrill Center, NP 10/15/2015, 12:36 PM

## 2015-10-15 NOTE — BHH Group Notes (Signed)
[facility] LCSW Aftercare Discharge Planning Group Note  10/15/2015 8:45 AM  Pt did not attend, declined invitation. CSW met individually with Pt who reported that she feels the Cymbalta is starting to help her depression. She expresses that she is physically sore after trying to take a shower by herself.   Peri Maris, LCSWA 10/15/2015 9:18 AM

## 2015-10-15 NOTE — Progress Notes (Addendum)
Karen Stein is having a difficult day today. She has been lying in her bed...most of the day. She says " I hurt really bad.I'll be glad when I get some relief". A SHe takes her scheduled meds as ordered. SHe  Completes her daily assessment and on it she writes she denies SI today and she rates her dperession, hopelessness anda  Anxiety" 6/6/6/". Respectively. R Safety in place.

## 2015-10-15 NOTE — Progress Notes (Signed)
Pt reports she is doing better since the patients were moved back to the unit late this afternoon.  She denies SI/HI/AVH at this time.  Pt is pleasant and cooperative with staff.  She feels her medications are helping, but she does not feel she is ready to be discharged yet.  Pt makes her needs known to staff.  Support and encouragement offered.  Pt plans to return home at discharge.  Discharge plans are in process.  Safety maintained with q15 minute checks.

## 2015-10-15 NOTE — Progress Notes (Signed)
Patient ID: Karen Stein, female   DOB: July 01, 1970, 46 y.o.   MRN: [id number] D: "This is the best day I have had in a long time, I was able to wash myself without help". Patient observed watching TV and interacting well with peers in dayroom. Mood and affect appeared depressed and sad. Pt reports tolerating medication well. Denies SI/HI/AVH.No behavioral issues noted.  A: Support and encouragement offered as needed. Medications administered as prescribed.  R: Patient remain safe and compliant with medication. Will continue to monitor patient for safety and stability.

## 2015-10-15 NOTE — Progress Notes (Signed)
[facility] MD Progress Note  10/15/2015 12:38 PM Karen Stein  MRN:  [id number]   Subjective:  Patient reports " I am having all over body pain due to my fibromyalgia"  Objective: Karen Stein is awake, alert and oriented X4. Seen resting in the her wheelchair.  Denies suicidal or homicidal ideation. Denies auditory or visual hallucination and does not appear to be responding to internal stimuli. Patient has a flat affect. Patient reports he is medication compliant without mediation side effects. States her depression 8/10. Patient reports chronic body pain. States she is having neck and back pain. Patient reports " I feel ready to go home."  Reports good appetite and states she is resting well though tout the night. Patient report she is going to sign a 72 hour request for discharge. Support, encouragement and reassurance was provided.   Principal Problem: Depression, major, severe recurrence ([place]) Diagnosis:   Patient Active Problem List   Diagnosis Date Noted  . Severe episode of recurrent major depressive disorder, without psychotic features ([place]) [F33.2]   . Major depressive disorder, recurrent severe without psychotic features ([place]) [F33.2] 10/12/2015  . Depression, major, severe recurrence ([place]) [F33.2] 10/12/2015  . Degenerative disc disease [IMO0002]   . Sickle cell trait ([place]) [D57.3]   . DDD (degenerative disc disease) [IMO0002]    Total Time spent with patient: 20 minutes    Past Medical History:  Past Medical History  Diagnosis Date  . Arthritis   . Degenerative disc disease   . Degenerative disc disease   . Sickle cell trait ([place])   . DDD (degenerative disc disease)   . Anxiety   . Endometriosis   . Scoliosis   . DDD (degenerative disc disease)   . PCOS (polycystic ovarian syndrome)   . Fibroids   . High cholesterol   . Fibromyalgia   . Dysrhythmia     stress and caffeine related  . Leg cramps   . Insomnia   . Diabetes mellitus     Type 2 insulin  resistant  . Anemia   . Complication of anesthesia     anesthesia awareness,  . GERD (gastroesophageal reflux disease)     Past Surgical History  Procedure Laterality Date  . Cesarean section    . Laparoscopy  2002 or 2003  . Wisdom tooth extraction    . Toe surgery  2008  . Biopsy breast    . Back surgery  2010  . Dilatation & curettage/hysteroscopy with myosure N/A 01/01/2015    Procedure: DILATATION & CURETTAGE/HYSTEROSCOPY WITH MYOSURE;  Surgeon: Crawford Givens, MD;  Location: [facility];  Service: Gynecology;  Laterality: N/A;   Family History:  Family History  Problem Relation Age of Onset  . Diabetes Paternal Grandfather   . Hypertension Paternal Grandfather   . Hyperlipidemia Paternal Grandfather   . Diabetes Paternal Grandmother   . Hypertension Paternal Grandmother   . Hyperlipidemia Paternal Grandmother   . Alzheimer's disease Maternal Grandmother   . Brain cancer Maternal Grandfather   . Diabetes Father   . Hypertension Father   . Hyperlipidemia Father   . Kidney failure Father     Social History:  History  Alcohol Use No    Comment: twice anually     History  Drug Use No    Social History   Social History  . Marital Status: Married    Spouse Name: N/A  . Number of Children: N/A  . Years of Education: N/A   Social History Main  Topics  . Smoking status: Never Smoker   . Smokeless tobacco: Never Used  . Alcohol Use: No     Comment: twice anually  . Drug Use: No  . Sexual Activity: Yes    Birth Control/ Protection: None   Other Topics Concern  . None   Social History Narrative   Additional Social History:   Sleep: Fair  Appetite:  Fair  Current Medications: Current Facility-Administered Medications  Medication Dose Route Frequency Provider Last Rate Last Dose  . acetaminophen (TYLENOL) tablet 650 mg  650 mg Oral Q6H PRN Patrecia Pour, NP      . alum & mag hydroxide-simeth (MAALOX/MYLANTA) 200-200-20 MG/5ML suspension 30 mL  30 mL Oral Q4H  PRN Patrecia Pour, NP      . cyclobenzaprine (FLEXERIL) tablet 10 mg  10 mg Oral TID PRN Laverle Hobby, PA-C   10 mg at 10/15/15 0906  . DULoxetine (CYMBALTA) DR capsule 40 mg  40 mg Oral Daily Jenne Campus, MD   40 mg at 10/15/15 0906  . gabapentin (NEURONTIN) capsule 100 mg  100 mg Oral TID Jenne Campus, MD   100 mg at 10/15/15 1213  . hydrOXYzine (ATARAX/VISTARIL) tablet 25 mg  25 mg Oral Q6H PRN Jenne Campus, MD   25 mg at 10/14/15 2107  . ibuprofen (ADVIL,MOTRIN) tablet 600 mg  600 mg Oral Q6H PRN Kerrie Buffalo, NP   600 mg at 10/13/15 0820  . insulin aspart (novoLOG) injection 0-15 Units  0-15 Units Subcutaneous TID WC Kerrie Buffalo, NP   5 Units at 10/15/15 1214  . insulin aspart (novoLOG) injection 0-5 Units  0-5 Units Subcutaneous QHS Kerrie Buffalo, NP   3 Units at 10/14/15 2109  . insulin glargine (LANTUS) injection 24 Units  24 Units Subcutaneous Daily Jenne Campus, MD   24 Units at 10/15/15 [phone]  . magnesium hydroxide (MILK OF MAGNESIA) suspension 30 mL  30 mL Oral Daily PRN Patrecia Pour, NP      . mirtazapine (REMERON) tablet 7.5 mg  7.5 mg Oral QHS Nanci Pina, FNP   7.5 mg at 10/14/15 2107  . oxyCODONE-acetaminophen (PERCOCET/ROXICET) 5-325 MG per tablet 1 tablet  1 tablet Oral Q8H PRN Jenne Campus, MD       And  . oxyCODONE (Oxy IR/ROXICODONE) immediate release tablet 2.5 mg  2.5 mg Oral Q8H PRN Jenne Campus, MD        Lab Results:  Results for orders placed or performed during the hospital encounter of 10/12/15 (from the past 48 hour(s))  Glucose, capillary     Status: Abnormal   Collection Time: 10/13/15  5:13 PM  Result Value Ref Range   Glucose-Capillary 279 (H) 65 - 99 mg/dL   Comment 1 Notify RN   Glucose, capillary     Status: Abnormal   Collection Time: 10/13/15  9:44 PM  Result Value Ref Range   Glucose-Capillary 225 (H) 65 - 99 mg/dL  Glucose, capillary     Status: Abnormal   Collection Time: 10/14/15  6:26 AM  Result Value  Ref Range   Glucose-Capillary 184 (H) 65 - 99 mg/dL  Glucose, capillary     Status: Abnormal   Collection Time: 10/14/15  9:42 AM  Result Value Ref Range   Glucose-Capillary 291 (H) 65 - 99 mg/dL   Comment 1 Notify RN   Glucose, capillary     Status: Abnormal   Collection Time: 10/14/15 12:13 PM  Result  Value Ref Range   Glucose-Capillary 251 (H) 65 - 99 mg/dL  Glucose, capillary     Status: Abnormal   Collection Time: 10/14/15  5:20 PM  Result Value Ref Range   Glucose-Capillary 229 (H) 65 - 99 mg/dL  Glucose, capillary     Status: Abnormal   Collection Time: 10/14/15  8:37 PM  Result Value Ref Range   Glucose-Capillary 275 (H) 65 - 99 mg/dL  Glucose, capillary     Status: Abnormal   Collection Time: 10/15/15  6:05 AM  Result Value Ref Range   Glucose-Capillary 262 (H) 65 - 99 mg/dL  Glucose, capillary     Status: Abnormal   Collection Time: 10/15/15 11:55 AM  Result Value Ref Range   Glucose-Capillary 225 (H) 65 - 99 mg/dL    Blood Alcohol level:  Lab Results  Component Value Date   ETH <5 10/11/2015    Physical Findings: AIMS: Facial and Oral Movements Muscles of Facial Expression: None, normal Lips and Perioral Area: None, normal Jaw: None, normal Tongue: None, normal,Extremity Movements Upper (arms, wrists, hands, fingers): None, normal Lower (legs, knees, ankles, toes): None, normal, Trunk Movements Neck, shoulders, hips: None, normal, Overall Severity Severity of abnormal movements (highest score from questions above): None, normal Incapacitation due to abnormal movements: None, normal Patient's awareness of abnormal movements (rate only patient's report): No Awareness, Dental Status Current problems with teeth and/or dentures?: No Does patient usually wear dentures?: No  CIWA:    COWS:     Musculoskeletal: Strength & Muscle Tone: within normal limits Gait & Station: normal Patient leans: N/A  Psychiatric Specialty Exam: Physical Exam  Vitals  reviewed. Constitutional: She is oriented to person, place, and time. She appears well-developed.  HENT:  Head: Normocephalic.  Musculoskeletal: Normal range of motion.  Neurological: She is alert and oriented to person, place, and time.  Psychiatric: She has a normal mood and affect. Her behavior is normal.    Review of Systems  Musculoskeletal: Positive for myalgias, back pain, joint pain and neck pain.  Psychiatric/Behavioral: Positive for depression. Negative for suicidal ideas and hallucinations. The patient is nervous/anxious and has insomnia.   All other systems reviewed and are negative.  chronic pain, some nausea, no vomiting   Blood pressure 125/82, pulse 92, temperature 98.1 F (36.7 C), temperature source Oral, resp. rate 16, height 5\' 8"  (1.727 m), weight 98.431 kg (217 lb), last menstrual period 09/01/2015.Body mass index is 33 kg/(m^2).  General Appearance: Fairly Groomed  Eye Contact:  Good  Speech:  Normal Rate  Volume:  Decreased  Mood:  Depressed  Affect:  constricted, but a little more reactive today,smiles at times appropriately   Thought Process:  Linear  Orientation:  Full (Time, Place, and Person)  Thought Content:  denies hallucinations, no delusions   Suicidal Thoughts:  No today denies any suicidal plan or intention and contracts for safety on the unit   Homicidal Thoughts:  No- denies any homicidal ideations   Memory:  recent and remote grossly intact   Judgement:  Other:  improving   Insight:  improving   Psychomotor Activity:  Decreased  Concentration:  Concentration: Good and Attention Span: Good  Recall:  Good  Fund of Knowledge:  Good  Language:  Good  Akathisia:  Negative  Handed:  Right  AIMS (if indicated):     Assets:  Communication Skills Desire for Improvement Resilience  ADL's:  Intact  Cognition:  WNL  Sleep:  Number of Hours: 6.5  I agree with current treatment plan on 10/15/2015, Patient seen face-to-face for psychiatric  evaluation follow-up, chart reviewed. Reviewed the information documented and agree with the treatment plan.  Treatment Plan Summary: Daily contact with patient to assess and evaluate symptoms and progress in treatment, Medication management, Plan inpatient treatment  and medications as below    Encourage ongoing group and milieu participation to work on coping skills and symptom reduction  Increased Cymbalta at 40mg  to 60 mg mgrs QDAY for depression/pain Continue Remeron 7.5 mg for depression / insomnia  Increased Neurontin at 100 mg to 200 mg TID  For anxiety, chronic pain Continue Vistaril 25 mgrs Q 6 hours PRN for anxiety as needed  As recommended by Diabetes Coordinator , increase Insulin Lantus to 24 units in AM Patient is requesting to sign a 72 hour request to discharge.  Derrill Center, NP 10/15/2015, 12:38 PM Agree with NP progress note as above

## 2015-10-16 DIAGNOSIS — F332 Major depressive disorder, recurrent severe without psychotic features: Principal | ICD-10-CM

## 2015-10-16 LAB — GLUCOSE, CAPILLARY
GLUCOSE-CAPILLARY: 221 mg/dL — AB (ref 65–99)
GLUCOSE-CAPILLARY: 265 mg/dL — AB (ref 65–99)
GLUCOSE-CAPILLARY: 411 mg/dL — AB (ref 65–99)
Glucose-Capillary: 269 mg/dL — ABNORMAL HIGH (ref 65–99)

## 2015-10-16 MED ORDER — INSULIN ASPART 100 UNIT/ML ~~LOC~~ SOLN
15.0000 [IU] | Freq: Once | SUBCUTANEOUS | Status: AC
  Administered 2015-10-16: 15 [IU] via SUBCUTANEOUS

## 2015-10-16 NOTE — Progress Notes (Signed)
[facility] Notes:  (Nursing/MHT/Case Management/Adjunct)  Date:  10/16/2015  Time:  10:21 PM  Type of Therapy:  Psychoeducational Skills  Participation Level:  Active  Participation Quality:  Appropriate  Affect:  Appropriate  Cognitive:  Appropriate  Insight:  Good  Engagement in Group:  Engaged  Modes of Intervention:  Education  Summary of Progress/Problems: The patient verbalized in group that she had a good day overall. The patient explained that she felt as if she is taking the correct medication since she is beginning to feel better. She expounded on that by stating that her pain is now more "manageable". As for the theme of the day, her support coping skill will be to not isolate herself as much and be more honest with people about her feelings.   Archie Balboa S 10/16/2015, 10:21 PM

## 2015-10-16 NOTE — Progress Notes (Signed)
D Karen Stein is (again) in her bed this morning. She says she " hurts too bad" to come to the med window to receive her morning medications. She avoids eye contact and speaks ever so softly...almost unable to hear her.  A She did complete her daily assessment and on it she wrote she deneid SI today and she rated her depression, hopelessness and anxiety " 5/2/6", respectively. R Safety in place. Writer encouraged pt to cont to monitor carbohydrate intake and be mindful of sugar intake.

## 2015-10-16 NOTE — [facility] Group Notes (Signed)
[facility] Group Notes: (Clinical Social Work)   10/16/2015      Type of Therapy:  Group Therapy   Participation Level:  Did Not Attend despite MHT prompting   Selmer Dominion, LCSW 10/16/2015, 11:33 AM

## 2015-10-16 NOTE — Progress Notes (Signed)
Patient ID: Karen Stein, female   DOB: January 29, 1971, 45 y.o.   MRN: [id number] [facility] MD Progress Note  10/16/2015 11:17 AM [name]  MRN:  [id number]   Subjective:  Patient reports " i have pain with fibromyalgia. " remains in her room   Objective: Karen Stein is awake, alert and oriented X4. Seen resting in the her room.   Denies suicidal or homicidal ideation. Denies auditory or visual hallucination and does not appear to be responding to internal stimuli. Patient has a flat affect. Patient reports he is medication compliant without mediation side effects. neurontin and cymbalta was increased earlier for fibroymalgia. Tolerated it reasonable.  States her depression 7/10. Patient reports chronic body pain. States she is having neck and back pain. Patient reports " I feel ready to go home."  But then did not re inforce that idea and says compliant with meds.  Reports good appetite and states she is resting well though tout the night. Support, encouragement and reassurance was provided.   Principal Problem: Depression, major, severe recurrence ([place]) Diagnosis:   Patient Active Problem List   Diagnosis Date Noted  . Severe episode of recurrent major depressive disorder, without psychotic features ([place]) [F33.2]   . Major depressive disorder, recurrent severe without psychotic features ([place]) [F33.2] 10/12/2015  . Depression, major, severe recurrence ([place]) [F33.2] 10/12/2015  . Degenerative disc disease [IMO0002]   . Sickle cell trait ([place]) [D57.3]   . DDD (degenerative disc disease) [IMO0002]    Total Time spent with patient: 20 minutes    Past Medical History:  Past Medical History  Diagnosis Date  . Arthritis   . Degenerative disc disease   . Degenerative disc disease   . Sickle cell trait ([place])   . DDD (degenerative disc disease)   . Anxiety   . Endometriosis   . Scoliosis   . DDD (degenerative disc disease)   . PCOS (polycystic ovarian syndrome)   .  Fibroids   . High cholesterol   . Fibromyalgia   . Dysrhythmia     stress and caffeine related  . Leg cramps   . Insomnia   . Diabetes mellitus     Type 2 insulin resistant  . Anemia   . Complication of anesthesia     anesthesia awareness,  . GERD (gastroesophageal reflux disease)     Past Surgical History  Procedure Laterality Date  . Cesarean section    . Laparoscopy  2002 or 2003  . Wisdom tooth extraction    . Toe surgery  2008  . Biopsy breast    . Back surgery  2010  . Dilatation & curettage/hysteroscopy with myosure N/A 01/01/2015    Procedure: DILATATION & CURETTAGE/HYSTEROSCOPY WITH MYOSURE;  Surgeon: Crawford Givens, MD;  Location: [facility];  Service: Gynecology;  Laterality: N/A;   Family History:  Family History  Problem Relation Age of Onset  . Diabetes Paternal Grandfather   . Hypertension Paternal Grandfather   . Hyperlipidemia Paternal Grandfather   . Diabetes Paternal Grandmother   . Hypertension Paternal Grandmother   . Hyperlipidemia Paternal Grandmother   . Alzheimer's disease Maternal Grandmother   . Brain cancer Maternal Grandfather   . Diabetes Father   . Hypertension Father   . Hyperlipidemia Father   . Kidney failure Father     Social History:  History  Alcohol Use No    Comment: twice anually     History  Drug Use No    Social History  Social History  . Marital Status: Married    Spouse Name: N/A  . Number of Children: N/A  . Years of Education: N/A   Social History Main Topics  . Smoking status: Never Smoker   . Smokeless tobacco: Never Used  . Alcohol Use: No     Comment: twice anually  . Drug Use: No  . Sexual Activity: Yes    Birth Control/ Protection: None   Other Topics Concern  . None   Social History Narrative   Additional Social History:   Sleep: Fair  Appetite:  Fair  Current Medications: Current Facility-Administered Medications  Medication Dose Route Frequency Provider Last Rate Last Dose  .  acetaminophen (TYLENOL) tablet 650 mg  650 mg Oral Q6H PRN Patrecia Pour, NP      . alum & mag hydroxide-simeth (MAALOX/MYLANTA) 200-200-20 MG/5ML suspension 30 mL  30 mL Oral Q4H PRN Patrecia Pour, NP      . cyclobenzaprine (FLEXERIL) tablet 10 mg  10 mg Oral TID PRN Laverle Hobby, PA-C   10 mg at 10/16/15 [id number]  . DULoxetine (CYMBALTA) DR capsule 60 mg  60 mg Oral Daily Derrill Center, NP   60 mg at 10/16/15 [id number]  . gabapentin (NEURONTIN) capsule 200 mg  200 mg Oral TID Derrill Center, NP   200 mg at 10/16/15 [id number]  . hydrOXYzine (ATARAX/VISTARIL) tablet 25 mg  25 mg Oral Q6H PRN Jenne Campus, MD   25 mg at 10/15/15 2226  . ibuprofen (ADVIL,MOTRIN) tablet 600 mg  600 mg Oral Q6H PRN Kerrie Buffalo, NP   600 mg at 10/13/15 0820  . insulin aspart (novoLOG) injection 0-15 Units  0-15 Units Subcutaneous TID WC Kerrie Buffalo, NP   5 Units at 10/16/15 [phone]  . insulin aspart (novoLOG) injection 0-5 Units  0-5 Units Subcutaneous QHS Kerrie Buffalo, NP   4 Units at 10/15/15 2114  . insulin glargine (LANTUS) injection 24 Units  24 Units Subcutaneous Daily Jenne Campus, MD   24 Units at 10/16/15 [phone]  . magnesium hydroxide (MILK OF MAGNESIA) suspension 30 mL  30 mL Oral Daily PRN Patrecia Pour, NP      . mirtazapine (REMERON) tablet 7.5 mg  7.5 mg Oral QHS Nanci Pina, FNP   7.5 mg at 10/15/15 2113  . oxyCODONE-acetaminophen (PERCOCET/ROXICET) 5-325 MG per tablet 1 tablet  1 tablet Oral Q8H PRN Jenne Campus, MD       And  . oxyCODONE (Oxy IR/ROXICODONE) immediate release tablet 2.5 mg  2.5 mg Oral Q8H PRN Jenne Campus, MD        Lab Results:  Results for orders placed or performed during the hospital encounter of 10/12/15 (from the past 48 hour(s))  Glucose, capillary     Status: Abnormal   Collection Time: 10/14/15 12:13 PM  Result Value Ref Range   Glucose-Capillary 251 (H) 65 - 99 mg/dL  Glucose, capillary     Status: Abnormal   Collection Time: 10/14/15  5:20 PM  Result  Value Ref Range   Glucose-Capillary 229 (H) 65 - 99 mg/dL  Glucose, capillary     Status: Abnormal   Collection Time: 10/14/15  8:37 PM  Result Value Ref Range   Glucose-Capillary 275 (H) 65 - 99 mg/dL  Glucose, capillary     Status: Abnormal   Collection Time: 10/15/15  6:05 AM  Result Value Ref Range   Glucose-Capillary 262 (H) 65 - 99 mg/dL  Glucose, capillary  Status: Abnormal   Collection Time: 10/15/15 11:55 AM  Result Value Ref Range   Glucose-Capillary 225 (H) 65 - 99 mg/dL  Glucose, capillary     Status: Abnormal   Collection Time: 10/15/15  5:17 PM  Result Value Ref Range   Glucose-Capillary 389 (H) 65 - 99 mg/dL  Glucose, capillary     Status: Abnormal   Collection Time: 10/15/15  8:30 PM  Result Value Ref Range   Glucose-Capillary 318 (H) 65 - 99 mg/dL  Glucose, capillary     Status: Abnormal   Collection Time: 10/16/15  6:23 AM  Result Value Ref Range   Glucose-Capillary 221 (H) 65 - 99 mg/dL    Blood Alcohol level:  Lab Results  Component Value Date   ETH <5 10/11/2015    Physical Findings: AIMS: Facial and Oral Movements Muscles of Facial Expression: None, normal Lips and Perioral Area: None, normal Jaw: None, normal Tongue: None, normal,Extremity Movements Upper (arms, wrists, hands, fingers): None, normal Lower (legs, knees, ankles, toes): None, normal, Trunk Movements Neck, shoulders, hips: None, normal, Overall Severity Severity of abnormal movements (highest score from questions above): None, normal Incapacitation due to abnormal movements: None, normal Patient's awareness of abnormal movements (rate only patient's report): No Awareness, Dental Status Current problems with teeth and/or dentures?: No Does patient usually wear dentures?: No  CIWA:    COWS:     Musculoskeletal: Strength & Muscle Tone: within normal limits Gait & Station: normal Patient leans: N/A  Psychiatric Specialty Exam: Physical Exam  Vitals reviewed. Constitutional:  She is oriented to person, place, and time. She appears well-developed.  HENT:  Head: Normocephalic.  Musculoskeletal: Normal range of motion.  Neurological: She is alert and oriented to person, place, and time.  Psychiatric: She has a normal mood and affect. Her behavior is normal.    Review of Systems  Cardiovascular: Negative for chest pain.  Musculoskeletal: Positive for myalgias, back pain and neck pain.  Psychiatric/Behavioral: Positive for depression. Negative for suicidal ideas and hallucinations. The patient is nervous/anxious.   All other systems reviewed and are negative.  chronic pain, some nausea, no vomiting   Blood pressure 124/71, pulse 95, temperature 97.7 F (36.5 C), temperature source Oral, resp. rate 17, height 5\' 8"  (1.727 m), weight 98.431 kg (217 lb), last menstrual period 09/01/2015.Body mass index is 33 kg/(m^2).  General Appearance: Fairly Groomed  Eye Contact:  Good  Speech:  Normal Rate  Volume:  Decreased  Mood:  Depressed  Affect:  constricted, but a little more reactive today,smiles at times appropriately   Thought Process:  Linear  Orientation:  Full (Time, Place, and Person)  Thought Content:  denies hallucinations, no delusions   Suicidal Thoughts:  No today denies any suicidal plan or intention and contracts for safety on the unit   Homicidal Thoughts:  No- denies any homicidal ideations   Memory:  recent and remote grossly intact   Judgement:  Other:  improving   Insight:  improving   Psychomotor Activity:  Decreased  Concentration:  Concentration: Good and Attention Span: Good  Recall:  Good  Fund of Knowledge:  Good  Language:  Good  Akathisia:  Negative  Handed:  Right  AIMS (if indicated):     Assets:  Communication Skills Desire for Improvement Resilience  ADL's:  Intact  Cognition:  WNL  Sleep:  Number of Hours: 3.25      Treatment Plan Summary: Daily contact with patient to assess and evaluate symptoms and progress in  treatment, Medication management, Plan inpatient treatment  and medications as below    Encourage ongoing group and milieu participation to work on coping skills and symptom reduction  Continue Cymbalta  60 mg mgrs QDAY for depression/pain Continue Remeron 7.5 mg for depression / insomnia  Continue Neurontin at 200 mg TID  For anxiety, chronic pain Continue Vistaril 25 mgrs Q 6 hours PRN for anxiety as needed  As recommended by Diabetes Coordinator , increase Insulin Lantus to 24 units in AM   De Nurse, Saria Haran, MD 10/16/2015, 11:17 AM

## 2015-10-17 LAB — GLUCOSE, CAPILLARY
GLUCOSE-CAPILLARY: 262 mg/dL — AB (ref 65–99)
GLUCOSE-CAPILLARY: 352 mg/dL — AB (ref 65–99)
GLUCOSE-CAPILLARY: 333 mg/dL — AB (ref 65–99)
Glucose-Capillary: 274 mg/dL — ABNORMAL HIGH (ref 65–99)

## 2015-10-17 NOTE — Progress Notes (Signed)
Patient ID: Karen Stein, female   DOB: 05/09/1970, 45 y.o.   MRN: [id number] D: Patient observed watching TV and interacting well with peers in dayroom. Mood and affect appeared depressed and sad. Pt reports tolerating medication well. Denies SI/HI/AVH.No behavioral issues noted.  A: Support and encouragement offered as needed. Medications administered as prescribed.  R: Patient remain safe and compliant with medication. Will continue to monitor patient for safety and stability.

## 2015-10-17 NOTE — Progress Notes (Signed)
Patient ID: Karen Stein, female   DOB: [birth year], 45 y.o.   MRN: [id number] [facility] MD Progress Note  10/17/2015 11:06 AM Karen Stein  MRN:  [id number]   Subjective:  Patient reports poor sleep, was in bed. Otherwise depression not worsened.    Objective: Karen Stein   Denies suicidal or homicidal ideation. Denies auditory or visual hallucination and does not appear to be responding to internal stimuli. Patient has a flat affect. Patient reports he is medication compliant without mediation side effects. neurontin and cymbalta was started 2 days ago  for fibroymalgia. Tolerated it reasonable.  States her depression 7/10. Patient reports chronic body pain. States she is having neck and back pain. Patient reports " I feel ready to go home."  But then did not re inforce that idea and says compliant with meds.  Reports good appetite and states she is resting well though tout the night. Support, encouragement and reassurance was provided.   Principal Problem: Depression, major, severe recurrence ([place]) Diagnosis:   Patient Active Problem List   Diagnosis Date Noted  . Severe episode of recurrent major depressive disorder, without psychotic features ([place]) [F33.2]   . Major depressive disorder, recurrent severe without psychotic features ([place]) [F33.2] 10/12/2015  . Depression, major, severe recurrence ([place]) [F33.2] 10/12/2015  . Degenerative disc disease [IMO0002]   . Sickle cell trait ([place]) [D57.3]   . DDD (degenerative disc disease) [IMO0002]    Total Time spent with patient: 20 minutes    Past Medical History:  Past Medical History  Diagnosis Date  . Arthritis   . Degenerative disc disease   . Degenerative disc disease   . Sickle cell trait ([place])   . DDD (degenerative disc disease)   . Anxiety   . Endometriosis   . Scoliosis   . DDD (degenerative disc disease)   . PCOS (polycystic ovarian syndrome)   . Fibroids   . High cholesterol   . Fibromyalgia   .  Dysrhythmia     stress and caffeine related  . Leg cramps   . Insomnia   . Diabetes mellitus     Type 2 insulin resistant  . Anemia   . Complication of anesthesia     anesthesia awareness,  . GERD (gastroesophageal reflux disease)     Past Surgical History  Procedure Laterality Date  . Cesarean section    . Laparoscopy  2002 or 2003  . Wisdom tooth extraction    . Toe surgery  2008  . Biopsy breast    . Back surgery  2010  . Dilatation & curettage/hysteroscopy with myosure N/A 01/01/2015    Procedure: DILATATION & CURETTAGE/HYSTEROSCOPY WITH MYOSURE;  Surgeon: Crawford Givens, MD;  Location: [place] [facility];  Service: Gynecology;  Laterality: N/A;   Family History:  Family History  Problem Relation Age of Onset  . Diabetes Paternal Grandfather   . Hypertension Paternal Grandfather   . Hyperlipidemia Paternal Grandfather   . Diabetes Paternal Grandmother   . Hypertension Paternal Grandmother   . Hyperlipidemia Paternal Grandmother   . Alzheimer's disease Maternal Grandmother   . Brain cancer Maternal Grandfather   . Diabetes Father   . Hypertension Father   . Hyperlipidemia Father   . Kidney failure Father     Social History:  History  Alcohol Use No    Comment: twice anually     History  Drug Use No    Social History   Social History  . Marital Status: Married  Spouse Name: N/A  . Number of Children: N/A  . Years of Education: N/A   Social History Main Topics  . Smoking status: Never Smoker   . Smokeless tobacco: Never Used  . Alcohol Use: No     Comment: twice anually  . Drug Use: No  . Sexual Activity: Yes    Birth Control/ Protection: None   Other Topics Concern  . None   Social History Narrative   Additional Social History:   Sleep: Fair  Appetite:  Fair  Current Medications: Current Facility-Administered Medications  Medication Dose Route Frequency Provider Last Rate Last Dose  . acetaminophen (TYLENOL) tablet 650 mg  650 mg Oral Q6H PRN Patrecia Pour, NP      . alum & mag hydroxide-simeth (MAALOX/MYLANTA) 200-200-20 MG/5ML suspension 30 mL  30 mL Oral Q4H PRN Patrecia Pour, NP      . cyclobenzaprine (FLEXERIL) tablet 10 mg  10 mg Oral TID PRN Laverle Hobby, PA-C   10 mg at 10/16/15 [id number]  . DULoxetine (CYMBALTA) DR capsule 60 mg  60 mg Oral Daily Derrill Center, NP   60 mg at 10/17/15 0945  . gabapentin (NEURONTIN) capsule 200 mg  200 mg Oral TID Derrill Center, NP   200 mg at 10/17/15 0945  . hydrOXYzine (ATARAX/VISTARIL) tablet 25 mg  25 mg Oral Q6H PRN Jenne Campus, MD   25 mg at 10/17/15 0018  . ibuprofen (ADVIL,MOTRIN) tablet 600 mg  600 mg Oral Q6H PRN Kerrie Buffalo, NP   600 mg at 10/13/15 0820  . insulin aspart (novoLOG) injection 0-15 Units  0-15 Units Subcutaneous TID WC Kerrie Buffalo, NP   8 Units at 10/17/15 [phone]  . insulin aspart (novoLOG) injection 0-5 Units  0-5 Units Subcutaneous QHS Kerrie Buffalo, NP   3 Units at 10/16/15 2113  . insulin glargine (LANTUS) injection 24 Units  24 Units Subcutaneous Daily Jenne Campus, MD   24 Units at 10/17/15 0946  . magnesium hydroxide (MILK OF MAGNESIA) suspension 30 mL  30 mL Oral Daily PRN Patrecia Pour, NP      . mirtazapine (REMERON) tablet 7.5 mg  7.5 mg Oral QHS Nanci Pina, FNP   7.5 mg at 10/16/15 2109  . oxyCODONE-acetaminophen (PERCOCET/ROXICET) 5-325 MG per tablet 1 tablet  1 tablet Oral Q8H PRN Jenne Campus, MD   1 tablet at 10/16/15 2109   And  . oxyCODONE (Oxy IR/ROXICODONE) immediate release tablet 2.5 mg  2.5 mg Oral Q8H PRN Jenne Campus, MD   2.5 mg at 10/16/15 2109    Lab Results:  Results for orders placed or performed during the hospital encounter of 10/12/15 (from the past 48 hour(s))  Glucose, capillary     Status: Abnormal   Collection Time: 10/15/15 11:55 AM  Result Value Ref Range   Glucose-Capillary 225 (H) 65 - 99 mg/dL  Glucose, capillary     Status: Abnormal   Collection Time: 10/15/15  5:17 PM  Result Value Ref Range    Glucose-Capillary 389 (H) 65 - 99 mg/dL  Glucose, capillary     Status: Abnormal   Collection Time: 10/15/15  8:30 PM  Result Value Ref Range   Glucose-Capillary 318 (H) 65 - 99 mg/dL  Glucose, capillary     Status: Abnormal   Collection Time: 10/16/15  6:23 AM  Result Value Ref Range   Glucose-Capillary 221 (H) 65 - 99 mg/dL  Glucose, capillary  Status: Abnormal   Collection Time: 10/16/15 12:05 PM  Result Value Ref Range   Glucose-Capillary 265 (H) 65 - 99 mg/dL  Glucose, capillary     Status: Abnormal   Collection Time: 10/16/15  5:21 PM  Result Value Ref Range   Glucose-Capillary 411 (H) 65 - 99 mg/dL  Glucose, capillary     Status: Abnormal   Collection Time: 10/16/15  8:46 PM  Result Value Ref Range   Glucose-Capillary 269 (H) 65 - 99 mg/dL   Comment 1 Notify RN   Glucose, capillary     Status: Abnormal   Collection Time: 10/17/15  5:43 AM  Result Value Ref Range   Glucose-Capillary 274 (H) 65 - 99 mg/dL   Comment 1 Notify RN     Blood Alcohol level:  Lab Results  Component Value Date   ETH <5 10/11/2015    Physical Findings: AIMS: Facial and Oral Movements Muscles of Facial Expression: None, normal Lips and Perioral Area: None, normal Jaw: None, normal Tongue: None, normal,Extremity Movements Upper (arms, wrists, hands, fingers): None, normal Lower (legs, knees, ankles, toes): None, normal, Trunk Movements Neck, shoulders, hips: None, normal, Overall Severity Severity of abnormal movements (highest score from questions above): None, normal Incapacitation due to abnormal movements: None, normal Patient's awareness of abnormal movements (rate only patient's report): No Awareness, Dental Status Current problems with teeth and/or dentures?: No Does patient usually wear dentures?: No  CIWA:    COWS:     Musculoskeletal: Strength & Muscle Tone: within normal limits Gait & Station: normal Patient leans: N/A  Psychiatric Specialty Exam: Physical Exam   Vitals reviewed. Constitutional: She is oriented to person, place, and time. She appears well-developed.  HENT:  Head: Normocephalic.  Musculoskeletal: Normal range of motion.  Neurological: She is alert and oriented to person, place, and time.  Psychiatric: She has a normal mood and affect. Her behavior is normal.    Review of Systems  Cardiovascular: Negative for chest pain.  Musculoskeletal: Positive for myalgias, back pain and neck pain.  Psychiatric/Behavioral: Positive for depression. Negative for suicidal ideas and hallucinations. The patient is nervous/anxious.   All other systems reviewed and are negative.  chronic pain, some nausea, no vomiting   Blood pressure 130/85, pulse 97, temperature 97.7 F (36.5 C), temperature source Oral, resp. rate 18, height 5\' 8"  (1.727 m), weight 98.431 kg (217 lb), last menstrual period 09/01/2015.Body mass index is 33 kg/(m^2).  General Appearance: Fairly Groomed  Eye Contact:  Good  Speech:  Normal Rate  Volume:  Decreased  Mood:  Depressed  Affect:  Less constricted  Thought Process:  Linear  Orientation:  Full (Time, Place, and Person)  Thought Content:  denies hallucinations, no delusions   Suicidal Thoughts:  No today denies any suicidal plan or intention and contracts for safety on the unit   Homicidal Thoughts:  No- denies any homicidal ideations   Memory:  recent and remote grossly intact   Judgement:  Other:  improving   Insight:  improving   Psychomotor Activity:  Decreased  Concentration:  Concentration: Good and Attention Span: Good  Recall:  Good  Fund of Knowledge:  Good  Language:  Good  Akathisia:  Negative  Handed:  Right  AIMS (if indicated):     Assets:  Communication Skills Desire for Improvement Resilience  ADL's:  Intact  Cognition:  WNL  Sleep:  Number of Hours: 4.75      Treatment Plan Summary: Daily contact with patient to assess and  evaluate symptoms and progress in treatment, Medication  management, Plan inpatient treatment  and medications as below    Encourage ongoing group and milieu participation to work on coping skills and symptom reduction  Continue Cymbalta  60 mg mgrs QDAY for depression/pain Continue Remeron 7.5 mg for depression / insomnia  Continue Neurontin at 200 mg TID  For anxiety, chronic pain Continue Vistaril 25 mgrs Q 6 hours PRN for anxiety as needed  As recommended by Diabetes Coordinator , increase Insulin Lantus to 24 units in AM Slow improvement. Work on more activity during the day and attend groups.   Merian Capron, MD 10/17/2015, 11:06 AM

## 2015-10-17 NOTE — Progress Notes (Signed)
D: Patient laying in bed on nurse arrival. Rated day 5/10. C/O chronic generalized pain/back pain 10/10.  Blood sugar 333 at HS.  Did not attend wrap up group. A:  PRN flexeril/ibuprofen given for pain.  SS insulin given per order with snack.  Will recheck CBG in AM.  Continues on 15 minute checks for safety. R:  Pain decreased to 7/10 after PRN medicine given.  Remains safe on unit.  Appeared to be asleep on 15 minute checks at midnight.

## 2015-10-17 NOTE — Plan of Care (Signed)
Problem: Safety: Goal: Ability to identify and utilize support systems that promote safety will improve Outcome: Progressing Pt attended and engaged in group activities, pt compliant with medication regime

## 2015-10-17 NOTE — Plan of Care (Signed)
Problem: Medication: Goal: Compliance with prescribed medication regimen will improve Outcome: Progressing Pt compliant with evening medication regime

## 2015-10-17 NOTE — Progress Notes (Signed)
Patient did not attend wrap-up group, she was lying down.

## 2015-10-17 NOTE — [facility] Group Notes (Signed)
[facility] LCSW Group Therapy  10/17/2015 10:10 until 11 AM  Type of Therapy:  Group Therapy  Participation Level:  Did Not Attend; despite overhead announcement on [address] hall and CSW personally asking pt to attend group in [place], [place]  10/17/2015

## 2015-10-17 NOTE — Progress Notes (Signed)
[facility] 7a-7p shift:   D:  Pt. Has been depressed but became slightly brighter towards the evening.  She talked about her father's passing from cancer in February and this being the first Father's Day spent without him.  She stated that she was relieved that he was now "in a better place because he suffered terribly with cancer before he passed".  Pt stated that her medication regimen/schedule in the hospital has been very effective in reducing her chronic pain from a "10+ to a 7" and she is requesting that her discharge papers provide detailed information.  A: Support, education, and encouragement provided as needed.  Level 3 checks continued for safety.  R: Pt.  receptive to intervention/s.  Safety maintained.  Prudencio Pair, RN

## 2015-10-18 LAB — GLUCOSE, CAPILLARY
GLUCOSE-CAPILLARY: 249 mg/dL — AB (ref 65–99)
Glucose-Capillary: 274 mg/dL — ABNORMAL HIGH (ref 65–99)

## 2015-10-18 MED ORDER — MIRTAZAPINE 7.5 MG PO TABS: 7.5000 mg | ORAL_TABLET | Freq: Every day | ORAL | Status: DC

## 2015-10-18 MED ORDER — VALACYCLOVIR HCL 500 MG PO TABS: 500.0000 mg | ORAL_TABLET | Freq: Every day | ORAL | Status: DC

## 2015-10-18 MED ORDER — GABAPENTIN 100 MG PO CAPS: 200.0000 mg | ORAL_CAPSULE | Freq: Three times a day (TID) | ORAL | Status: DC

## 2015-10-18 MED ORDER — DULOXETINE HCL 60 MG PO CPEP: 60.0000 mg | ORAL_CAPSULE | Freq: Every day | ORAL | Status: DC

## 2015-10-18 MED ORDER — NORGESTIM-ETH ESTRAD TRIPHASIC 0.18/0.215/0.25 MG-35 MCG PO TABS: 1.0000 | ORAL_TABLET | Freq: Every day | ORAL | Status: DC

## 2015-10-18 NOTE — Progress Notes (Signed)
Recreation Therapy Notes  Date: 06.19.2017 Time: 9:30am Location: [address] Group Room   Group Topic: Stress Management  Goal Area(s) Addresses:  Patient will actively participate in stress management techniques presented during session.   Behavioral Response: Did not attend.   Laureen Ochs Desree Leap, LRT/CTRS        Byron Peacock L 10/18/2015 10:22 AM

## 2015-10-18 NOTE — Tx Team (Signed)
Interdisciplinary Treatment Plan Update (Adult) Date: 10/18/2015    Time Reviewed: 9:30 AM  Progress in Treatment: Attending groups: Minimally  Participating in groups: Minimally  Taking medication as prescribed: Yes Tolerating medication: Yes Family/Significant other contact made: Yes, CSW has spoken with husband Patient understands diagnosis: Yes Discussing patient identified problems/goals with staff: Yes Medical problems stabilized or resolved: Yes Denies suicidal/homicidal ideation: Yes Issues/concerns per patient self-inventory: Yes Other:  New problem(s) identified: N/A  Discharge Plan or Barriers: Home with outpatient services.  Reason for Continuation of Hospitalization:  Depression Anxiety Medication Stabilization   Comments: N/A  Estimated length of stay: Discharge anticipated for today 10/18/15    Patient is a 45 year old female admitted for SI with plan to jump off bridge. Pt presented to the hospital with chronic pain and inability to sleep, as well as depression. Pt reports primary trigger(s) for admission was chronic pain, inability to sleep, husband losing job, declining work Systems analyst, recent death of father. Patient will benefit from crisis stabilization, medication evaluation, group therapy and psycho education in addition to case management for discharge planning. At discharge, it is recommended that Pt remain compliant with established discharge plan and continued treatment.   Review of initial/current patient goals per problem list:  1. Goal(s): Patient will participate in aftercare plan   Met: Yes   Target date: 3-5 days post admission date   As evidenced by: Patient will participate within aftercare plan AEB aftercare provider and housing plan at discharge being identified.  6/14: Goal met. Patient plans to return home with outpatient services.    2. Goal (s): Patient will exhibit decreased depressive symptoms and suicidal ideations.    Met: Adequate for discharge per MD   Target date: 3-5 days post admission date   As evidenced by: Patient will utilize self rating of depression at 3 or below and demonstrate decreased signs of depression or be deemed stable for discharge by MD.  6/14: Patient rates depression at 8.  6/19: Adequate for discharge per MD. Patient reports improvement in depressive symptoms, denies SI. She reports feeling safe for discharge.   3. Goal(s): Patient will demonstrate decreased signs and symptoms of anxiety.   Met: Adequate for discharge per MD   Target date: 3-5 days post admission date   As evidenced by: Patient will utilize self rating of anxiety at 3 or below and demonstrated decreased signs of anxiety, or be deemed stable for discharge by MD  6/14: Patient rates anxiety at 10.  6/19: Adequate for discharge per MD. Patient reports feeling safe for discharge.    Attendees:  Patient:    Family:    Physician: Dr. Shea Evans, Dr. Einar Grad, MD  10/18/2015   Nursing: Gaylan Gerold, RN 10/18/2015   Clinical Social Worker: Erasmo Downer Nakoma Gotwalt LCSW, Alvina Chou Smart LCSW 10/18/2015   Other:    Clinical: May Malachi Carl, NP 10/18/2015   Other:                 Scribe for Treatment Team:  Tilden Fossa, [place]

## 2015-10-18 NOTE — Discharge Summary (Signed)
Physician Discharge Summary Note  Patient:  Karen Stein is an 45 y.o., female MRN:  [id number] DOB:  09-07-1970 Patient phone:  [phone] (home)  Patient address:   [address] [place] [address],  Total Time spent with patient: 30 minutes  Date of Admission:  10/12/2015 Date of Discharge: 10/18/2015  Reason for Admission:    Principal Problem: Depression, major, severe recurrence [facility]) Discharge Diagnoses: Patient Active Problem List   Diagnosis Date Noted  . Severe episode of recurrent major depressive disorder, without psychotic features ([place]) [F33.2]   . Major depressive disorder, recurrent severe without psychotic features ([place]) [F33.2] 10/12/2015  . Depression, major, severe recurrence ([place]) [F33.2] 10/12/2015  . Degenerative disc disease [IMO0002]   . Sickle cell trait ([place]) [D57.3]   . DDD (degenerative disc disease) [IMO0002]     Past Psychiatric History: see HPI  Past Medical History:  Past Medical History  Diagnosis Date  . Arthritis   . Degenerative disc disease   . Degenerative disc disease   . Sickle cell trait ([place])   . DDD (degenerative disc disease)   . Anxiety   . Endometriosis   . Scoliosis   . DDD (degenerative disc disease)   . PCOS (polycystic ovarian syndrome)   . Fibroids   . High cholesterol   . Fibromyalgia   . Dysrhythmia     stress and caffeine related  . Leg cramps   . Insomnia   . Diabetes mellitus     Type 2 insulin resistant  . Anemia   . Complication of anesthesia     anesthesia awareness,  . GERD (gastroesophageal reflux disease)     Past Surgical History  Procedure Laterality Date  . Cesarean section    . Laparoscopy  2002 or 2003  . Wisdom tooth extraction    . Toe surgery  2008  . Biopsy breast    . Back surgery  2010  . Dilatation & curettage/hysteroscopy with myosure N/A 01/01/2015    Procedure: DILATATION & CURETTAGE/HYSTEROSCOPY WITH MYOSURE;  Surgeon: Crawford Givens, MD;  Location: [facility];   Service: Gynecology;  Laterality: N/A;   Family History:  Family History  Problem Relation Age of Onset  . Diabetes Paternal Grandfather   . Hypertension Paternal Grandfather   . Hyperlipidemia Paternal Grandfather   . Diabetes Paternal Grandmother   . Hypertension Paternal Grandmother   . Hyperlipidemia Paternal Grandmother   . Alzheimer's disease Maternal Grandmother   . Brain cancer Maternal Grandfather   . Diabetes Father   . Hypertension Father   . Hyperlipidemia Father   . Kidney failure Father    Family Psychiatric  History: see HPI Social History:  History  Alcohol Use No    Comment: twice anually     History  Drug Use No    Social History   Social History  . Marital Status: Married    Spouse Name: N/A  . Number of Children: N/A  . Years of Education: N/A   Social History Main Topics  . Smoking status: Never Smoker   . Smokeless tobacco: Never Used  . Alcohol Use: No     Comment: twice anually  . Drug Use: No  . Sexual Activity: Yes    Birth Control/ Protection: None   Other Topics Concern  . None   Social History Narrative    Hospital Course:   Per HPI, Karen Stein is 45 year old female who presents with worsening depression x 1 month and new onset of  suicidal ideation x 2 months. She is married, with (1) daughter who is 62 years old and is expecting her first child any day now (DD 10/21/2015). 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 She lives with her husband, and aunt that was recently displaced from her home.   Karen Stein was admitted for Depression, major, severe recurrence ([place]) and crisis management.  She was treated with Gabapentin 200 mg TID anxiety, Remeron 7.5 mg insomnia, Cymbalta 60 mg depression.  Medical problems were identified and treated as needed.  Home medications were restarted as appropriate.  Improvement was monitored by observation and Ihor Austin daily report of symptom  reduction.  Emotional and mental status was monitored by daily self inventory reports completed by Ihor Austin and clinical staff.  Patient reported continued improvement, denied any new concerns.  Patient had been compliant on medications and denied side effects.  Support and encouragement was provided.    Patient did well during inpatient stay.  At time of discharge, patient rated both depression and anxiety levels to be manageable and minimal.  Patient encouraged to attend groups to help with recognizing triggers of emotional crises and de-stabilizations.  Patient encouraged to attend group to help identify the positive things in life that would help in dealing with feelings of loss, depression and unhealthy or abusive tendencies.         Karen Stein was evaluated by the treatment team for stability and plans for continued recovery upon discharge.  She was offered further treatment options upon discharge including Residential, Intensive Outpatient and Outpatient treatment.  She will follow up with agencies listed below for medication management and counseling.  Encouraged patient to maintain satisfactory support network and home environment.  Advised to adhere to medication compliance and outpatient treatment follow up.      Karen Stein motivation was an integral factor for scheduling further treatment.  Employment, transportation, bed availability, health status, family support, and any pending legal issues were also considered during her hospital stay.  Upon completion of this admission the patient was both mentally and medically stable for discharge denying suicidal/homicidal ideation, auditory/visual/tactile hallucinations, delusional thoughts and paranoia.      Physical Findings: AIMS: Facial and Oral Movements Muscles of Facial Expression: None, normal Lips and Perioral Area: None, normal Jaw: None, normal Tongue: None, normal,Extremity Movements Upper (arms, wrists,  hands, fingers): None, normal Lower (legs, knees, ankles, toes): None, normal, Trunk Movements Neck, shoulders, hips: None, normal, Overall Severity Severity of abnormal movements (highest score from questions above): None, normal Incapacitation due to abnormal movements: None, normal Patient's awareness of abnormal movements (rate only patient's report): No Awareness, Dental Status Current problems with teeth and/or dentures?: No Does patient usually wear dentures?: No  CIWA:    COWS:     Musculoskeletal: Strength & Muscle Tone: within normal limits Gait & Station: normal Patient leans: N/A  Psychiatric Specialty Exam: Physical Exam  HENT:  Mouth/Throat: Oropharynx is clear and moist.    ROS  Blood pressure 99/65, pulse 104, temperature 98.1 F (36.7 C), temperature source Oral, resp. rate 16, height 5\' 8"  (1.727 m), weight 98.431 kg (217 lb), last menstrual period 09/01/2015.Body mass index is 33 kg/(m^2).    Have you used any form of tobacco in the last 30 days? (Cigarettes, Smokeless Tobacco, Cigars, and/or Pipes): No  Has this patient used any form of tobacco in the last 30 days? (Cigarettes, Smokeless Tobacco, Cigars, and/or Pipes) NA  Blood Alcohol level:  Lab Results  Component Value Date   ETH <5 [id number]    Metabolic Disorder Labs:  Lab Results  Component Value Date   HGBA1C * 04/14/2010    10.9 (NOTE)                                                                       According to the ADA Clinical Practice Recommendations for 2011, when HbA1c is used as a screening test:   >=6.5%   Diagnostic of Diabetes Mellitus           (if abnormal result  is confirmed)  5.7-6.4%   Increased risk of developing Diabetes Mellitus  References:Diagnosis and Classification of Diabetes Mellitus,Diabetes [id number] 1):S62-S69 and Standards of Medical Care in         Diabetes - 2011,Diabetes [id number]  (Suppl 1):S11-S61.   MPG 266* 04/14/2010   Lab Results   Component Value Date   PROLACTIN 6.7 07/22/2012   Lab Results  Component Value Date   CHOL * 04/14/2010    209        ATP III CLASSIFICATION:  <200     mg/dL   Desirable  200-239  mg/dL   Borderline High  >=240    mg/dL   High          TRIG 123 04/14/2010   HDL 35* 04/14/2010   CHOLHDL 6.0 04/14/2010   VLDL 25 04/14/2010   LDLCALC * 04/14/2010    149        Total Cholesterol/HDL:CHD Risk Coronary Heart Disease Risk Table                     Men   Women  1/2 Average Risk   3.4   3.3  Average Risk       5.0   4.4  2 X Average Risk   9.6   7.1  3 X Average Risk  23.4   11.0        Use the calculated Patient Ratio above and the CHD Risk Table to determine the patient's CHD Risk.        ATP III CLASSIFICATION (LDL):  <100     mg/dL   Optimal  100-129  mg/dL   Near or Above                    Optimal  130-159  mg/dL   Borderline  160-189  mg/dL   High  >190     mg/dL   Very High    See Psychiatric Specialty Exam and Suicide Risk Assessment completed by Attending Physician prior to discharge.  Discharge destination:  Home  Is patient on multiple antipsychotic therapies at discharge:  No   Has Patient had three or more failed trials of antipsychotic monotherapy by history:  No  Recommended Plan for Multiple Antipsychotic Therapies: NA     Medication List    STOP taking these medications        cyclobenzaprine 10 MG tablet  Commonly known as:  FLEXERIL     diazepam 5 MG tablet  Commonly known as:  VALIUM     ergocalciferol 50000 units capsule  Commonly known as:  VITAMIN D2  fluconazole 150 MG tablet  Commonly known as:  DIFLUCAN     glucose blood test strip     ibuprofen 600 MG tablet  Commonly known as:  ADVIL,MOTRIN     INVOKANA 300 MG Tabs tablet  Generic drug:  canagliflozin     RABEprazole 20 MG tablet  Commonly known as:  ACIPHEX     traMADol 50 MG tablet  Commonly known as:  ULTRAM     TRESIBA FLEXTOUCH 100 UNIT/ML Sopn  Generic  drug:  Insulin Degludec     VICTOZA 18 MG/3ML Sopn  Generic drug:  Liraglutide     VITAMIN B 12 PO      TAKE these medications      Indication   DULoxetine 60 MG capsule  Commonly known as:  CYMBALTA  Take 1 capsule (60 mg total) by mouth daily.   Indication:  Fibromyalgia Syndrome, Generalized Anxiety Disorder, Major Depressive Disorder, Musculoskeletal Pain     gabapentin 100 MG capsule  Commonly known as:  NEURONTIN  Take 2 capsules (200 mg total) by mouth 3 (three) times daily.   Indication:  Agitation     mirtazapine 7.5 MG tablet  Commonly known as:  REMERON  Take 1 tablet (7.5 mg total) by mouth at bedtime.   Indication:  Trouble Sleeping     Norgestimate-Ethinyl Estradiol Triphasic 0.18/0.215/0.25 MG-35 MCG tablet  Commonly known as:  TRI-SPRINTEC  Take 1 tablet by mouth daily.   Indication:  Birth Control     valACYclovir 500 MG tablet  Commonly known as:  VALTREX  Take 1 tablet (500 mg total) by mouth daily.   Indication:  Herpes Simplex Infection       Follow-up Information    Follow up with [facility] On 10/27/2015.   Why:  Wednesday June 28th at 5:30pm with Leanord Asal, NP for medication management.    Contact information:   [address] [place], [place] [address] Phone [phone] Fax [phone]      Follow up with [facility] On 10/21/2015.   Why:  Therapy appt on Thursday June 22nd at 12pm with Lamount Cohen. Please bring completed new patient paperwork to appt and call office if you need to reschedule appt.   Contact information:   [address] [place], [place], [address] [phone]  Office [phone]  Fax      Follow-up recommendations:  Activity:  as tol Diet:  as tol  Comments:  1.  Take all your medications as prescribed.   2.  Report any adverse side effects to outpatient provider. 3.  Patient instructed to not use alcohol or illegal drugs  while on prescription medicines. 4.  In the event of worsening symptoms, instructed patient to call 911, the crisis hotline or go to nearest emergency room for evaluation of symptoms.  Signed: Janett Labella, NP [facility] 10/18/2015, 10:14 AM

## 2015-10-18 NOTE — Progress Notes (Signed)
  [facility] Adult Case Management Discharge Plan :  Will you be returning to the same living situation after discharge:  Yes,  patient plans to return home At discharge, do you have transportation home?: Yes,  husband will transport Do you have the ability to pay for your medications: Yes,  patient will be provided with prescriptions   Release of information consent forms completed and in the chart;  Patient's signature needed at discharge.  Patient to Follow up at: Follow-up Information    Follow up with [facility] On 10/27/2015.   Why:  Wednesday June 28th at 5:30pm with Leanord Asal, NP for medication management.    Contact information:   [address] [place], [place] [address] Phone [phone] Fax [phone]      Follow up with [facility] On 10/21/2015.   Why:  Therapy appt on Thursday June 22nd at 12pm with Lamount Cohen. Please bring completed new patient paperwork to appt and call office if you need to reschedule appt.   Contact information:   [address], [place], [place], [address] [phone]  Office [phone]  Fax      Next level of care provider has access to [place] and Suicide Prevention discussed: Yes,  with patient and husband  Have you used any form of tobacco in the last 30 days? (Cigarettes, Smokeless Tobacco, Cigars, and/or Pipes): No  Has patient been referred to the Quitline?: N/A, patient does not smoke  Patient has been referred for addiction treatment: Yes  Latanja Lehenbauer, Casimiro Needle 10/18/2015, 10:09 AM

## 2015-10-18 NOTE — Progress Notes (Signed)
Discharge D- Patient verbalizes readiness for discharge: Denies SI/HI, is not psychotic or delusional. A- Discharge instructions read and discussed with patient.  All belongings returned to patient to include her $20 bill. R- Patient was cooperative with discharge process.  Patient verbalizes understanding of discharge instructions.  Signed for return of belongings. Escorted to the lobby for discharge.

## 2015-10-18 NOTE — [facility] Suicide Risk Assessment (Signed)
Karen Stein Discharge Suicide Risk Assessment   Principal Problem: Depression, major, severe recurrence [facility]) Discharge Diagnoses:  Patient Active Problem List   Diagnosis Date Noted  . Severe episode of recurrent major depressive disorder, without psychotic features ([place]) [F33.2]   . Major depressive disorder, recurrent severe without psychotic features ([place]) [F33.2] 10/12/2015  . Depression, major, severe recurrence ([place]) [F33.2] 10/12/2015  . Degenerative disc disease [IMO0002]   . Sickle cell trait ([place]) [D57.3]   . DDD (degenerative disc disease) [IMO0002]     Total Time spent with patient: 15 minutes  Musculoskeletal: Strength & Muscle Tone: within normal limits Gait & Station: normal Patient leans: N/A  Psychiatric Specialty Exam: ROS  Blood pressure 99/65, pulse 104, temperature 98.1 F (36.7 C), temperature source Oral, resp. rate 16, height 5\' 8"  (1.727 m), weight 217 lb (98.431 kg), last menstrual period 09/01/2015.Body mass index is 33 kg/(m^2).  General Appearance: Casual  Eye Contact::  Fair  Speech:  Clear and [id number]  Volume:  Decreased  Mood:  Euthymic  Affect:  Appropriate  Thought Process:  Coherent  Orientation:  Full (Time, Place, and Person)  Thought Content:  WDL  Suicidal Thoughts:  No  Homicidal Thoughts:  No  Memory:  Immediate;   Fair Recent;   Fair Remote;   Fair  Judgement:  Fair  Insight:  Fair  Psychomotor Activity:  Normal  Concentration:  Fair  Recall:  [employer] [place]  Language: Fair  Akathisia:  No  Handed:  Right  AIMS (if indicated):     Assets:  Communication Skills Desire for Improvement Housing Social Support  Sleep:  Number of Hours: 5.25  Cognition: WNL  ADL's:  Intact   Mental Status: At discharge patient is quite stable and denies any suicidal thoughts.  Demographic Factors:  Patient is a 45 year old African-American female who is married and has just had her first grandchild.  Loss Factors: Decline  in physical health  Historical Factors: NA  Risk Reduction Factors:   Sense of responsibility to family, Positive social support and Positive coping skills or problem solving skills  Continued Clinical Symptoms:  Improved sleep and mood Cognitive Features That Contribute To Risk:  None    Suicide Risk:  Minimal: No identifiable suicidal ideation.  Patients presenting with no risk factors but with morbid ruminations; may be classified as minimal risk based on the severity of the depressive symptoms  Follow-up Information    Follow up with [facility] On 10/27/2015.   Why:  Wednesday June 28th at 5:30pm with Karen Asal, NP for medication management.    Contact information:   [address] [place], [place] [address] Phone [phone] Fax [phone]      Follow up with [facility] On 10/21/2015.   Why:  Therapy appt on Thursday June 22nd at 12pm with Lamount Cohen. Please bring completed new patient paperwork to appt and call office if you need to reschedule appt.   Contact information:   [address] [place], [place], [address] [phone]  Office [phone]  Fax      Plan Of Care/Follow-up recommendations:  Activity:  Normal Diet:  Normal   And to follow-up with discharge instructions regarding her medications. Follow-up with the outpatient appointments. Patient aware of safety plan if she has suicidal thoughts.  Elvin So, MD 10/18/2015, 11:18 AM

## 2015-10-21 DIAGNOSIS — F4320 Adjustment disorder, unspecified: Secondary | ICD-10-CM | POA: Diagnosis not present

## 2015-10-21 DIAGNOSIS — F331 Major depressive disorder, recurrent, moderate: Secondary | ICD-10-CM | POA: Diagnosis not present

## 2015-11-04 DIAGNOSIS — G629 Polyneuropathy, unspecified: Secondary | ICD-10-CM | POA: Diagnosis not present

## 2015-11-04 DIAGNOSIS — I10 Essential (primary) hypertension: Secondary | ICD-10-CM | POA: Diagnosis not present

## 2015-11-04 DIAGNOSIS — F322 Major depressive disorder, single episode, severe without psychotic features: Secondary | ICD-10-CM | POA: Diagnosis not present

## 2015-11-04 DIAGNOSIS — Z79899 Other long term (current) drug therapy: Secondary | ICD-10-CM | POA: Diagnosis not present

## 2015-11-04 DIAGNOSIS — G8929 Other chronic pain: Secondary | ICD-10-CM | POA: Diagnosis not present

## 2015-11-04 DIAGNOSIS — Z794 Long term (current) use of insulin: Secondary | ICD-10-CM | POA: Diagnosis not present

## 2015-11-04 DIAGNOSIS — E1142 Type 2 diabetes mellitus with diabetic polyneuropathy: Secondary | ICD-10-CM | POA: Diagnosis not present

## 2015-11-24 ENCOUNTER — Encounter (HOSPITAL_COMMUNITY): Payer: Self-pay | Admitting: *Deleted

## 2015-11-24 ENCOUNTER — Ambulatory Visit (HOSPITAL_COMMUNITY)
Admission: EM | Admit: 2015-11-24 | Discharge: 2015-11-24 | Disposition: A | Discharge status: Home / Self Care | Payer: BLUE CROSS/BLUE SHIELD | Attending: Family Medicine | Admitting: Family Medicine

## 2015-11-24 DIAGNOSIS — M797 Fibromyalgia: Secondary | ICD-10-CM | POA: Diagnosis not present

## 2015-11-24 MED ORDER — KETOROLAC TROMETHAMINE 30 MG/ML IJ SOLN
INTRAMUSCULAR | Status: AC
  Filled 2015-11-24: qty 1

## 2015-11-24 MED ORDER — PREGABALIN 150 MG PO CAPS: 150.0000 mg | ORAL_CAPSULE | Freq: Two times a day (BID) | ORAL | 0 refills | Status: DC

## 2015-11-24 MED ORDER — KETOROLAC TROMETHAMINE 30 MG/ML IJ SOLN
30.0000 mg | Freq: Once | INTRAMUSCULAR | Status: AC
  Administered 2015-11-24: 30 mg via INTRAMUSCULAR

## 2015-11-24 MED ORDER — METHYLPREDNISOLONE ACETATE 80 MG/ML IJ SUSP
INTRAMUSCULAR | Status: AC
  Filled 2015-11-24: qty 1

## 2015-11-24 MED ORDER — METHYLPREDNISOLONE ACETATE 80 MG/ML IJ SUSP
80.0000 mg | Freq: Once | INTRAMUSCULAR | Status: AC
  Administered 2015-11-24: 80 mg via INTRAMUSCULAR

## 2015-11-24 NOTE — Discharge Instructions (Signed)
See your doctor as planned. °

## 2015-11-24 NOTE — ED Triage Notes (Signed)
C/O fibromyalgia flare-up, worse since last night.  Has appt with Pain Management on 8/1, and was unable to get a sooner appt.

## 2015-11-24 NOTE — ED Provider Notes (Signed)
[place]    CSN: [id number] Arrival date & time: 11/24/15  [id number]  First Provider Contact:  First MD Initiated Contact with Patient 11/24/15 2036        History   Chief Complaint Chief Complaint  Patient presents with  . Pain    HPI OWYN WILBUR is a 45 y.o. female.    Illness  Severity:  Moderate Onset quality:  Gradual Duration:  2 days Chronicity:  Chronic Context:  Chronic fibro with flare starting yest. Associated symptoms: fatigue and myalgias   Associated symptoms: no abdominal pain, no chest pain, no fever, no nausea and no rash     Past Medical History:  Diagnosis Date  . Anemia   . Anxiety   . Arthritis   . Complication of anesthesia    anesthesia awareness,  . DDD (degenerative disc disease)   . DDD (degenerative disc disease)   . Degenerative disc disease   . Degenerative disc disease   . Diabetes mellitus    Type 2 insulin resistant  . Dysrhythmia    stress and caffeine related  . Endometriosis   . Fibroids   . Fibromyalgia   . GERD (gastroesophageal reflux disease)   . High cholesterol   . Insomnia   . Leg cramps   . PCOS (polycystic ovarian syndrome)   . Scoliosis   . Sickle cell trait [facility])     Patient Active Problem List   Diagnosis Date Noted  . Severe episode of recurrent major depressive disorder, without psychotic features ([place])   . Major depressive disorder, recurrent severe without psychotic features ([place]) 10/12/2015  . Depression, major, severe recurrence ([place]) 10/12/2015  . Degenerative disc disease   . Sickle cell trait ([place])   . DDD (degenerative disc disease)     Past Surgical History:  Procedure Laterality Date  . BACK SURGERY  2010  . BIOPSY BREAST    . CESAREAN SECTION    . DILATATION & CURETTAGE/HYSTEROSCOPY WITH MYOSURE N/A 01/01/2015   Procedure: DILATATION & CURETTAGE/HYSTEROSCOPY WITH MYOSURE;  Surgeon: Crawford Givens, MD;  Location: [facility];  Service: Gynecology;  Laterality: N/A;  .  LAPAROSCOPY  2002 or 2003  . TOE SURGERY  2008  . WISDOM TOOTH EXTRACTION      OB History    Gravida Para Term Preterm AB Living   4 2 1 1 2 1    SAB TAB Ectopic Multiple Live Births   2               Home Medications    Prior to Admission medications   Medication Sig Start Date End Date Taking? Authorizing Provider  Insulin Degludec-Liraglutide (XULTOPHY [place]) Inject 20 Units into the skin daily.   Yes Historical Provider, MD  Insulin Regular Human (NOVOLIN R IJ) Inject 2-4 Units as directed.   Yes Historical Provider, MD  Methocarbamol (ROBAXIN PO) Take by mouth.   Yes Historical Provider, MD  DULoxetine (CYMBALTA) 60 MG capsule Take 1 capsule (60 mg total) by mouth daily. 10/18/15   Kerrie Buffalo, NP  gabapentin (NEURONTIN) 100 MG capsule Take 2 capsules (200 mg total) by mouth 3 (three) times daily. 10/18/15   Kerrie Buffalo, NP  mirtazapine (REMERON) 7.5 MG tablet Take 1 tablet (7.5 mg total) by mouth at bedtime. 10/18/15   Kerrie Buffalo, NP  Norgestimate-Ethinyl Estradiol Triphasic (TRI-SPRINTEC) 0.18/0.215/0.25 MG-35 MCG tablet Take 1 tablet by mouth daily. 10/18/15   Kerrie Buffalo, NP  pregabalin (LYRICA) 150 MG capsule Take 1 capsule (  150 mg total) by mouth 2 (two) times daily. 11/24/15   Billy Fischer, MD  valACYclovir (VALTREX) 500 MG tablet Take 1 tablet (500 mg total) by mouth daily. 10/18/15   Kerrie Buffalo, NP    Family History Family History  Problem Relation Age of Onset  . Diabetes Paternal Grandfather   . Hypertension Paternal Grandfather   . Hyperlipidemia Paternal Grandfather   . Diabetes Paternal Grandmother   . Hypertension Paternal Grandmother   . Hyperlipidemia Paternal Grandmother   . Alzheimer's disease Maternal Grandmother   . Brain cancer Maternal Grandfather   . Diabetes Father   . Hypertension Father   . Hyperlipidemia Father   . Kidney failure Father     Social History Social History  Substance Use Topics  . Smoking status: Never Smoker  .  Smokeless tobacco: Never Used  . Alcohol use No     Comment: twice anually     Allergies   Shellfish allergy and Sulfa antibiotics   Review of Systems Review of Systems  Constitutional: Positive for fatigue. Negative for chills and fever.  Cardiovascular: Negative for chest pain.  Gastrointestinal: Negative for abdominal pain and nausea.  Genitourinary: Negative for pelvic pain.  Musculoskeletal: Positive for arthralgias and myalgias.  Skin: Negative for rash.  Neurological: Negative.   All other systems reviewed and are negative.    Physical Exam Triage Vital Signs ED Triage Vitals [11/24/15 2028]  Enc Vitals Group     BP 137/84     Pulse Rate 97     Resp 18     Temp 98.5 F (36.9 C)     Temp Source Oral     SpO2 98 %     Weight 223 lb (101.2 kg)     Height 5\' 8"  (1.727 m)     Head Circumference      Peak Flow      Pain Score      Pain Loc      Pain Edu?      Excl. in [place]?    No data found.   Updated Vital Signs BP 137/84 (BP Location: Right Arm)   Pulse 97   Temp 98.5 F (36.9 C) (Oral)   Resp 18   Ht 5\' 8"  (1.727 m)   Wt 223 lb (101.2 kg)   SpO2 98%   BMI 33.91 kg/m   Visual Acuity Right Eye Distance:   Left Eye Distance:   Bilateral Distance:    Right Eye Near:   Left Eye Near:    Bilateral Near:     Physical Exam  Constitutional: She is oriented to person, place, and time. She appears well-developed and well-nourished. She appears distressed.  Neck: Normal range of motion. Neck supple.  Cardiovascular: Normal rate, regular rhythm and normal heart sounds.   Pulmonary/Chest: Effort normal and breath sounds normal.  Abdominal: Soft. Bowel sounds are normal. There is no tenderness.  Lymphadenopathy:    She has no cervical adenopathy.  Neurological: She is alert and oriented to person, place, and time.  Skin: Skin is warm and dry.  Nursing note and vitals reviewed.    UC Treatments / Results  Labs (all labs ordered are listed, but only  abnormal results are displayed) Labs Reviewed - No data to display  EKG  EKG Interpretation None       Radiology No results found.  Procedures Procedures (including critical care time)  Medications Ordered in UC Medications  ketorolac (TORADOL) 30 MG/ML injection 30 mg (  not administered)  methylPREDNISolone acetate (DEPO-MEDROL) injection 80 mg (not administered)     Initial Impression / Assessment and Plan / UC Course  I have reviewed the triage vital signs and the nursing notes.  Pertinent labs & imaging results that were available during my care of the patient were reviewed by me and considered in my medical decision making (see chart for details).  Clinical Course      Final Clinical Impressions(s) / UC Diagnoses   Final diagnoses:  Fibromyalgia syndrome    New Prescriptions New Prescriptions   PREGABALIN (LYRICA) 150 MG CAPSULE    Take 1 capsule (150 mg total) by mouth 2 (two) times daily.     Billy Fischer, MD 11/24/15 2051

## 2015-11-29 DIAGNOSIS — R079 Chest pain, unspecified: Secondary | ICD-10-CM | POA: Diagnosis not present

## 2015-11-30 DIAGNOSIS — Z79891 Long term (current) use of opiate analgesic: Secondary | ICD-10-CM | POA: Diagnosis not present

## 2015-11-30 DIAGNOSIS — M542 Cervicalgia: Secondary | ICD-10-CM | POA: Diagnosis not present

## 2015-11-30 DIAGNOSIS — Z79899 Other long term (current) drug therapy: Secondary | ICD-10-CM | POA: Diagnosis not present

## 2015-11-30 DIAGNOSIS — G894 Chronic pain syndrome: Secondary | ICD-10-CM | POA: Diagnosis not present

## 2015-11-30 DIAGNOSIS — M25519 Pain in unspecified shoulder: Secondary | ICD-10-CM | POA: Diagnosis not present

## 2015-11-30 DIAGNOSIS — M545 Low back pain: Secondary | ICD-10-CM | POA: Diagnosis not present

## 2015-12-03 ENCOUNTER — Ambulatory Visit
Admission: RE | Admit: 2015-12-03 | Discharge: 2015-12-03 | Disposition: A | Discharge status: Home / Self Care | Payer: BLUE CROSS/BLUE SHIELD | Source: Ambulatory Visit | Attending: Physician Assistant | Admitting: Physician Assistant

## 2015-12-03 ENCOUNTER — Other Ambulatory Visit: Payer: Self-pay | Admitting: Physician Assistant

## 2015-12-03 DIAGNOSIS — M5136 Other intervertebral disc degeneration, lumbar region: Secondary | ICD-10-CM | POA: Diagnosis not present

## 2015-12-03 DIAGNOSIS — J029 Acute pharyngitis, unspecified: Secondary | ICD-10-CM | POA: Diagnosis not present

## 2015-12-03 DIAGNOSIS — M545 Low back pain: Secondary | ICD-10-CM

## 2015-12-08 ENCOUNTER — Encounter (HOSPITAL_BASED_OUTPATIENT_CLINIC_OR_DEPARTMENT_OTHER): Payer: Self-pay

## 2015-12-08 ENCOUNTER — Emergency Department (HOSPITAL_BASED_OUTPATIENT_CLINIC_OR_DEPARTMENT_OTHER): Payer: BLUE CROSS/BLUE SHIELD

## 2015-12-08 ENCOUNTER — Emergency Department (HOSPITAL_BASED_OUTPATIENT_CLINIC_OR_DEPARTMENT_OTHER)
Admission: EM | Admit: 2015-12-08 | Discharge: 2015-12-08 | Disposition: A | Discharge status: Home / Self Care | Payer: BLUE CROSS/BLUE SHIELD | Attending: Emergency Medicine | Admitting: Emergency Medicine

## 2015-12-08 DIAGNOSIS — M7989 Other specified soft tissue disorders: Secondary | ICD-10-CM | POA: Diagnosis not present

## 2015-12-08 DIAGNOSIS — M79669 Pain in unspecified lower leg: Secondary | ICD-10-CM | POA: Insufficient documentation

## 2015-12-08 DIAGNOSIS — E119 Type 2 diabetes mellitus without complications: Secondary | ICD-10-CM | POA: Insufficient documentation

## 2015-12-08 DIAGNOSIS — Z794 Long term (current) use of insulin: Secondary | ICD-10-CM | POA: Insufficient documentation

## 2015-12-08 DIAGNOSIS — T148XXA Other injury of unspecified body region, initial encounter: Secondary | ICD-10-CM

## 2015-12-08 DIAGNOSIS — S86811A Strain of other muscle(s) and tendon(s) at lower leg level, right leg, initial encounter: Secondary | ICD-10-CM | POA: Diagnosis not present

## 2015-12-08 HISTORY — DX: Other chronic pain: G89.29

## 2015-12-08 LAB — CBC WITH DIFFERENTIAL/PLATELET
BASOS ABS: 0.1 10*3/uL (ref 0.0–0.1)
Basophils Relative: 1 %
EOS ABS: 0.1 10*3/uL (ref 0.0–0.7)
EOS PCT: 1 %
HEMATOCRIT: 42.5 % (ref 36.0–46.0)
Hemoglobin: 14.2 g/dL (ref 12.0–15.0)
LYMPHS ABS: 3 10*3/uL (ref 0.7–4.0)
Lymphocytes Relative: 39 %
MCH: 27.4 pg (ref 26.0–34.0)
MCHC: 33.4 g/dL (ref 30.0–36.0)
MCV: 81.9 fL (ref 78.0–100.0)
MONO ABS: 0.3 10*3/uL (ref 0.1–1.0)
Monocytes Relative: 4 %
Neutro Abs: 4.2 10*3/uL (ref 1.7–7.7)
Neutrophils Relative %: 55 %
Platelets: 349 10*3/uL (ref 150–400)
RBC: 5.19 MIL/uL — AB (ref 3.87–5.11)
RDW: 14.3 % (ref 11.5–15.5)
WBC: 7.7 10*3/uL (ref 4.0–10.5)

## 2015-12-08 LAB — BASIC METABOLIC PANEL
Anion gap: 7 (ref 5–15)
BUN: 10 mg/dL (ref 6–20)
CO2: 28 mmol/L (ref 22–32)
CREATININE: 0.81 mg/dL (ref 0.44–1.00)
Calcium: 8.8 mg/dL — ABNORMAL LOW (ref 8.9–10.3)
Chloride: 100 mmol/L — ABNORMAL LOW (ref 101–111)
GFR calc Af Amer: >60 mL/min (ref >60)
Glucose, Bld: 354 mg/dL — ABNORMAL HIGH (ref 65–99)
Potassium: 3.8 mmol/L (ref 3.5–5.1)
Sodium: 135 mmol/L (ref 135–145)

## 2015-12-08 LAB — CBG MONITORING, ED: Glucose-Capillary: 373 mg/dL — ABNORMAL HIGH (ref 65–99)

## 2015-12-08 LAB — RAPID STREP SCREEN (MED CTR MEBANE ONLY): STREPTOCOCCUS, GROUP A SCREEN (DIRECT): NEGATIVE

## 2015-12-08 NOTE — ED Provider Notes (Signed)
[facility] DEPT [facility] Provider Note   CSN: [id number] Arrival date & time: 12/08/15  1255  First Provider Contact:  First MD Initiated Contact with Patient 12/08/15 1547        History   Chief Complaint Chief Complaint  Patient presents with  . Generalized Body Aches    HPI Karen Stein is a 45 y.o. female who presents for c/o  R calf pain. She states she's been having some intermittent pain in the calf with some minimal swelling. Last night she awoke from sleep about 6 AM with severe pain in the calf. She states it is not like a charley horse. Has been persistent. She states she can barely put pressure on the leg. She denies any recent injuries, history of DVTs, recent foreign travel, confinement or surgeries. Denies fevers, chills, myalgias, arthralgias. Denies DOE, SOB, chest tightness or pressure, radiation to left arm, jaw or back, or diaphoresis. Denies dysuria, flank pain, suprapubic pain, frequency, urgency, or hematuria. Denies headaches, light headedness, weakness, visual disturbances. Denies abdominal pain, nausea, vomiting, diarrhea or constipation.  HPI  Past Medical History:  Diagnosis Date  . Anemia   . Anxiety   . Arthritis   . Chronic pain   . Complication of anesthesia    anesthesia awareness,  . DDD (degenerative disc disease)   . DDD (degenerative disc disease)   . Degenerative disc disease   . Degenerative disc disease   . Diabetes mellitus    Type 2 insulin resistant  . Dysrhythmia    stress and caffeine related  . Endometriosis   . Fibroids   . Fibromyalgia   . GERD (gastroesophageal reflux disease)   . High cholesterol   . Insomnia   . Leg cramps   . PCOS (polycystic ovarian syndrome)   . Scoliosis   . Sickle cell trait [facility])     Patient Active Problem List   Diagnosis Date Noted  . Severe episode of recurrent major depressive disorder, without psychotic features ([place])   . Major depressive disorder, recurrent severe without  psychotic features ([place]) 10/12/2015  . Depression, major, severe recurrence ([place]) 10/12/2015  . Degenerative disc disease   . Sickle cell trait ([place])   . DDD (degenerative disc disease)     Past Surgical History:  Procedure Laterality Date  . BACK SURGERY  2010  . BIOPSY BREAST    . CESAREAN SECTION    . DILATATION & CURETTAGE/HYSTEROSCOPY WITH MYOSURE N/A 01/01/2015   Procedure: DILATATION & CURETTAGE/HYSTEROSCOPY WITH MYOSURE;  Surgeon: Crawford Givens, MD;  Location: [facility];  Service: Gynecology;  Laterality: N/A;  . LAPAROSCOPY  2002 or 2003  . TOE SURGERY  2008  . WISDOM TOOTH EXTRACTION      OB History    Gravida Para Term Preterm AB Living   4 2 1 1 2 1    SAB TAB Ectopic Multiple Live Births   2               Home Medications    Prior to Admission medications   Medication Sig Start Date End Date Taking? Authorizing Provider  DULoxetine (CYMBALTA) 60 MG capsule Take 1 capsule (60 mg total) by mouth daily. 10/18/15  Yes Kerrie Buffalo, NP  gabapentin (NEURONTIN) 100 MG capsule Take 2 capsules (200 mg total) by mouth 3 (three) times daily. Patient taking differently: Take 300 mg by mouth 3 (three) times daily.  10/18/15  Yes Kerrie Buffalo, NP  Insulin Degludec-Liraglutide (XULTOPHY [place]) Inject 20 Units into the  skin daily.   Yes Historical Provider, MD  Insulin Regular Human (NOVOLIN R IJ) Inject 2-4 Units as directed.   Yes Historical Provider, MD  Methocarbamol (ROBAXIN PO) Take by mouth.   Yes Historical Provider, MD  morphine (MSIR) 15 MG tablet Take 15 mg by mouth every 8 (eight) hours as needed for severe pain.   Yes Historical Provider, MD  Morphine-Naltrexone (EMBEDA) 30-1.2 MG CPCR Take 1.2 mg by mouth every morning.   Yes Historical Provider, MD  valACYclovir (VALTREX) 500 MG tablet Take 1 tablet (500 mg total) by mouth daily. 10/18/15  Yes Kerrie Buffalo, NP    Family History Family History  Problem Relation Age of Onset  . Diabetes Father   . Hypertension  Father   . Hyperlipidemia Father   . Kidney failure Father   . Diabetes Paternal Grandfather   . Hypertension Paternal Grandfather   . Hyperlipidemia Paternal Grandfather   . Diabetes Paternal Grandmother   . Hypertension Paternal Grandmother   . Hyperlipidemia Paternal Grandmother   . Alzheimer's disease Maternal Grandmother   . Brain cancer Maternal Grandfather     Social History Social History  Substance Use Topics  . Smoking status: Never Smoker  . Smokeless tobacco: Never Used  . Alcohol use No     Allergies   Shellfish allergy and Sulfa antibiotics   Review of Systems Review of Systems  Ten systems reviewed and are negative for acute change, except as noted in the HPI.   Physical Exam Updated Vital Signs BP 116/75 (BP Location: Left Arm)   Pulse 94   Temp 98.2 F (36.8 C) (Oral)   Resp 18   Ht 5\' 8"  (1.727 m)   Wt 101.2 kg   LMP 11/13/2015 (Exact Date)   SpO2 100%   BMI 33.91 kg/m   Physical Exam  Constitutional: She is oriented to person, place, and time. She appears well-developed and well-nourished. No distress.  HENT:  Head: Normocephalic and atraumatic.  Eyes: Conjunctivae are normal. No scleral icterus.  Neck: Normal range of motion.  Cardiovascular: Normal rate, regular rhythm and normal heart sounds.  Exam reveals no gallop and no friction rub.   No murmur heard. Pulmonary/Chest: Effort normal and breath sounds normal. No respiratory distress.  Abdominal: Soft. Bowel sounds are normal. She exhibits no distension and no mass. There is no tenderness. There is no guarding.  Musculoskeletal: She exhibits edema and tenderness.  R calf with minimal swelling. TTP. Compartments soft. Pain with dorsiflexion.  Neurological: She is alert and oriented to person, place, and time.  Skin: Skin is warm and dry. She is not diaphoretic.  Nursing note and vitals reviewed.    ED Treatments / Results  Labs (all labs ordered are listed, but only abnormal  results are displayed) Labs Reviewed  BASIC METABOLIC PANEL - Abnormal; Notable for the following:       Result Value   Chloride 100 (*)    Glucose, Bld 354 (*)    Calcium 8.8 (*)    All other components within normal limits  CBC WITH DIFFERENTIAL/PLATELET - Abnormal; Notable for the following:    RBC 5.19 (*)    All other components within normal limits  CBG MONITORING, ED - Abnormal; Notable for the following:    Glucose-Capillary 373 (*)    All other components within normal limits  RAPID STREP SCREEN (NOT AT [facility])  CULTURE, GROUP A STREP [facility])    EKG  EKG Interpretation None  Radiology No results found.  Procedures Procedures (including critical care time)  Medications Ordered in ED Medications - No data to display   Initial Impression / Assessment and Plan / ED Course  I have reviewed the triage vital signs and the nursing notes.  Pertinent labs & imaging results that were available during my care of the patient were reviewed by me and considered in my medical decision making (see chart for details).  Clinical Course    Patient with insulin resistant diabetes. She is hyperglycemic, however, she states that this is improvement as her sugars to be always in the 500s. She is currently receiving an ultrasound on her leg. It DVT is negative. I suspect this is a muscular issue and patient may be discharged with crutches and PCP follow-up with ice and anti-inflammatories. She currently is under pain control contract. She is on MSIR 15 mg and has a 30 day supply given on 12/02/15 via review of the [place] controlled substances reporting system. The patient will be given in signed out to [place] who seems care for discharge.  Final Clinical Impressions(s) / ED Diagnoses   Final diagnoses:  None    New Prescriptions New Prescriptions   No medications on file     Margarita Mail, PA-C 12/08/15 [place], MD 12/09/15 [phone]

## 2015-12-08 NOTE — ED Notes (Signed)
Patient transported to Ultrasound 

## 2015-12-08 NOTE — ED Triage Notes (Signed)
C/o body aches, cramps, light headed, sore throat x 1 week-denies v/d-NAD-steady gait

## 2015-12-08 NOTE — ED Provider Notes (Signed)
Signed out to me pending DVT study.  DVT study negative.  Suspect muscle strain.  PCP follow-up.  Will give crutches for comfort.   Montine Circle, PA-C 12/08/15 1728    Charlesetta Shanks, MD 12/08/15 [phone]

## 2015-12-09 DIAGNOSIS — M542 Cervicalgia: Secondary | ICD-10-CM | POA: Diagnosis not present

## 2015-12-09 DIAGNOSIS — G894 Chronic pain syndrome: Secondary | ICD-10-CM | POA: Diagnosis not present

## 2015-12-09 DIAGNOSIS — M545 Low back pain: Secondary | ICD-10-CM | POA: Diagnosis not present

## 2015-12-09 DIAGNOSIS — M25519 Pain in unspecified shoulder: Secondary | ICD-10-CM | POA: Diagnosis not present

## 2015-12-09 DIAGNOSIS — Z79891 Long term (current) use of opiate analgesic: Secondary | ICD-10-CM | POA: Diagnosis not present

## 2015-12-09 DIAGNOSIS — Z79899 Other long term (current) drug therapy: Secondary | ICD-10-CM | POA: Diagnosis not present

## 2015-12-11 LAB — CULTURE, GROUP A STREP (THRC)

## 2015-12-23 DIAGNOSIS — G894 Chronic pain syndrome: Secondary | ICD-10-CM | POA: Diagnosis not present

## 2015-12-23 DIAGNOSIS — M25519 Pain in unspecified shoulder: Secondary | ICD-10-CM | POA: Diagnosis not present

## 2015-12-23 DIAGNOSIS — M545 Low back pain: Secondary | ICD-10-CM | POA: Diagnosis not present

## 2015-12-23 DIAGNOSIS — M542 Cervicalgia: Secondary | ICD-10-CM | POA: Diagnosis not present

## 2015-12-26 DIAGNOSIS — Z981 Arthrodesis status: Secondary | ICD-10-CM | POA: Diagnosis not present

## 2015-12-26 DIAGNOSIS — R739 Hyperglycemia, unspecified: Secondary | ICD-10-CM | POA: Diagnosis not present

## 2015-12-26 DIAGNOSIS — M797 Fibromyalgia: Secondary | ICD-10-CM | POA: Diagnosis not present

## 2015-12-26 DIAGNOSIS — K5900 Constipation, unspecified: Secondary | ICD-10-CM | POA: Diagnosis not present

## 2015-12-26 DIAGNOSIS — R10814 Left lower quadrant abdominal tenderness: Secondary | ICD-10-CM | POA: Diagnosis not present

## 2015-12-26 DIAGNOSIS — R109 Unspecified abdominal pain: Secondary | ICD-10-CM | POA: Diagnosis not present

## 2015-12-26 DIAGNOSIS — R1032 Left lower quadrant pain: Secondary | ICD-10-CM | POA: Diagnosis not present

## 2015-12-26 DIAGNOSIS — R110 Nausea: Secondary | ICD-10-CM | POA: Diagnosis not present

## 2016-01-06 DIAGNOSIS — M545 Low back pain: Secondary | ICD-10-CM | POA: Diagnosis not present

## 2016-01-06 DIAGNOSIS — Z79891 Long term (current) use of opiate analgesic: Secondary | ICD-10-CM | POA: Diagnosis not present

## 2016-01-06 DIAGNOSIS — M542 Cervicalgia: Secondary | ICD-10-CM | POA: Diagnosis not present

## 2016-01-06 DIAGNOSIS — Z79899 Other long term (current) drug therapy: Secondary | ICD-10-CM | POA: Diagnosis not present

## 2016-01-06 DIAGNOSIS — G894 Chronic pain syndrome: Secondary | ICD-10-CM | POA: Diagnosis not present

## 2016-01-06 DIAGNOSIS — M47817 Spondylosis without myelopathy or radiculopathy, lumbosacral region: Secondary | ICD-10-CM | POA: Diagnosis not present

## 2016-01-06 DIAGNOSIS — M25519 Pain in unspecified shoulder: Secondary | ICD-10-CM | POA: Diagnosis not present

## 2016-01-19 DIAGNOSIS — E6609 Other obesity due to excess calories: Secondary | ICD-10-CM | POA: Diagnosis not present

## 2016-01-19 DIAGNOSIS — F332 Major depressive disorder, recurrent severe without psychotic features: Secondary | ICD-10-CM | POA: Diagnosis not present

## 2016-01-19 DIAGNOSIS — E1142 Type 2 diabetes mellitus with diabetic polyneuropathy: Secondary | ICD-10-CM | POA: Diagnosis not present

## 2016-01-19 DIAGNOSIS — G8929 Other chronic pain: Secondary | ICD-10-CM | POA: Diagnosis not present

## 2016-01-20 DIAGNOSIS — F3181 Bipolar II disorder: Secondary | ICD-10-CM | POA: Diagnosis not present

## 2016-01-21 DIAGNOSIS — F3181 Bipolar II disorder: Secondary | ICD-10-CM | POA: Diagnosis not present

## 2016-01-25 DIAGNOSIS — F3181 Bipolar II disorder: Secondary | ICD-10-CM | POA: Diagnosis not present

## 2016-02-03 DIAGNOSIS — G629 Polyneuropathy, unspecified: Secondary | ICD-10-CM | POA: Diagnosis not present

## 2016-02-03 DIAGNOSIS — E782 Mixed hyperlipidemia: Secondary | ICD-10-CM | POA: Diagnosis not present

## 2016-02-03 DIAGNOSIS — E1142 Type 2 diabetes mellitus with diabetic polyneuropathy: Secondary | ICD-10-CM | POA: Diagnosis not present

## 2016-02-03 DIAGNOSIS — E6609 Other obesity due to excess calories: Secondary | ICD-10-CM | POA: Diagnosis not present

## 2016-02-05 DIAGNOSIS — M5489 Other dorsalgia: Secondary | ICD-10-CM | POA: Diagnosis not present

## 2016-02-05 DIAGNOSIS — G441 Vascular headache, not elsewhere classified: Secondary | ICD-10-CM | POA: Diagnosis not present

## 2016-02-05 DIAGNOSIS — M542 Cervicalgia: Secondary | ICD-10-CM | POA: Diagnosis not present

## 2016-02-05 DIAGNOSIS — M9903 Segmental and somatic dysfunction of lumbar region: Secondary | ICD-10-CM | POA: Diagnosis not present

## 2016-02-07 DIAGNOSIS — M25519 Pain in unspecified shoulder: Secondary | ICD-10-CM | POA: Diagnosis not present

## 2016-02-07 DIAGNOSIS — Z79899 Other long term (current) drug therapy: Secondary | ICD-10-CM | POA: Diagnosis not present

## 2016-02-07 DIAGNOSIS — G894 Chronic pain syndrome: Secondary | ICD-10-CM | POA: Diagnosis not present

## 2016-02-07 DIAGNOSIS — Z79891 Long term (current) use of opiate analgesic: Secondary | ICD-10-CM | POA: Diagnosis not present

## 2016-02-07 DIAGNOSIS — M542 Cervicalgia: Secondary | ICD-10-CM | POA: Diagnosis not present

## 2016-02-07 DIAGNOSIS — M545 Low back pain: Secondary | ICD-10-CM | POA: Diagnosis not present

## 2016-02-11 DIAGNOSIS — M25512 Pain in left shoulder: Secondary | ICD-10-CM | POA: Diagnosis not present

## 2016-02-15 DIAGNOSIS — M797 Fibromyalgia: Secondary | ICD-10-CM | POA: Diagnosis not present

## 2016-02-15 DIAGNOSIS — G603 Idiopathic progressive neuropathy: Secondary | ICD-10-CM | POA: Diagnosis not present

## 2016-02-15 DIAGNOSIS — M542 Cervicalgia: Secondary | ICD-10-CM | POA: Diagnosis not present

## 2016-02-15 DIAGNOSIS — M5417 Radiculopathy, lumbosacral region: Secondary | ICD-10-CM | POA: Diagnosis not present

## 2016-02-18 DIAGNOSIS — M47817 Spondylosis without myelopathy or radiculopathy, lumbosacral region: Secondary | ICD-10-CM | POA: Diagnosis not present

## 2016-03-01 DIAGNOSIS — D509 Iron deficiency anemia, unspecified: Secondary | ICD-10-CM | POA: Diagnosis not present

## 2016-03-06 DIAGNOSIS — Z79899 Other long term (current) drug therapy: Secondary | ICD-10-CM | POA: Diagnosis not present

## 2016-03-06 DIAGNOSIS — I10 Essential (primary) hypertension: Secondary | ICD-10-CM | POA: Diagnosis not present

## 2016-03-06 DIAGNOSIS — G8929 Other chronic pain: Secondary | ICD-10-CM | POA: Diagnosis not present

## 2016-03-06 DIAGNOSIS — M542 Cervicalgia: Secondary | ICD-10-CM | POA: Diagnosis not present

## 2016-03-06 DIAGNOSIS — G894 Chronic pain syndrome: Secondary | ICD-10-CM | POA: Diagnosis not present

## 2016-03-06 DIAGNOSIS — M546 Pain in thoracic spine: Secondary | ICD-10-CM | POA: Diagnosis not present

## 2016-03-06 DIAGNOSIS — E1142 Type 2 diabetes mellitus with diabetic polyneuropathy: Secondary | ICD-10-CM | POA: Diagnosis not present

## 2016-03-06 DIAGNOSIS — Z79891 Long term (current) use of opiate analgesic: Secondary | ICD-10-CM | POA: Diagnosis not present

## 2016-03-06 DIAGNOSIS — M545 Low back pain: Secondary | ICD-10-CM | POA: Diagnosis not present

## 2016-03-06 DIAGNOSIS — G629 Polyneuropathy, unspecified: Secondary | ICD-10-CM | POA: Diagnosis not present

## 2016-03-15 DIAGNOSIS — M25512 Pain in left shoulder: Secondary | ICD-10-CM | POA: Diagnosis not present

## 2016-03-15 DIAGNOSIS — G8929 Other chronic pain: Secondary | ICD-10-CM | POA: Diagnosis not present

## 2016-03-17 ENCOUNTER — Other Ambulatory Visit: Payer: Self-pay | Admitting: Orthopedic Surgery

## 2016-03-17 DIAGNOSIS — M25512 Pain in left shoulder: Secondary | ICD-10-CM

## 2016-03-28 DIAGNOSIS — B372 Candidiasis of skin and nail: Secondary | ICD-10-CM | POA: Diagnosis not present

## 2016-03-28 DIAGNOSIS — Z794 Long term (current) use of insulin: Secondary | ICD-10-CM | POA: Diagnosis not present

## 2016-03-28 DIAGNOSIS — Z6834 Body mass index (BMI) 34.0-34.9, adult: Secondary | ICD-10-CM | POA: Diagnosis not present

## 2016-03-28 DIAGNOSIS — E1165 Type 2 diabetes mellitus with hyperglycemia: Secondary | ICD-10-CM | POA: Diagnosis not present

## 2016-03-30 ENCOUNTER — Other Ambulatory Visit: Payer: Self-pay | Admitting: Pain Medicine

## 2016-03-30 DIAGNOSIS — M545 Low back pain: Secondary | ICD-10-CM

## 2016-03-31 ENCOUNTER — Other Ambulatory Visit: Payer: Self-pay

## 2016-03-31 ENCOUNTER — Ambulatory Visit
Admission: RE | Admit: 2016-03-31 | Discharge: 2016-03-31 | Disposition: A | Discharge status: Home / Self Care | Payer: BLUE CROSS/BLUE SHIELD | Source: Ambulatory Visit | Attending: Pain Medicine | Admitting: Pain Medicine

## 2016-03-31 DIAGNOSIS — M545 Low back pain: Secondary | ICD-10-CM

## 2016-03-31 DIAGNOSIS — M5136 Other intervertebral disc degeneration, lumbar region: Secondary | ICD-10-CM | POA: Diagnosis not present

## 2016-04-03 DIAGNOSIS — M542 Cervicalgia: Secondary | ICD-10-CM | POA: Diagnosis not present

## 2016-04-03 DIAGNOSIS — M546 Pain in thoracic spine: Secondary | ICD-10-CM | POA: Diagnosis not present

## 2016-04-03 DIAGNOSIS — M545 Low back pain: Secondary | ICD-10-CM | POA: Diagnosis not present

## 2016-04-03 DIAGNOSIS — Z79899 Other long term (current) drug therapy: Secondary | ICD-10-CM | POA: Diagnosis not present

## 2016-04-03 DIAGNOSIS — G894 Chronic pain syndrome: Secondary | ICD-10-CM | POA: Diagnosis not present

## 2016-04-03 DIAGNOSIS — Z79891 Long term (current) use of opiate analgesic: Secondary | ICD-10-CM | POA: Diagnosis not present

## 2016-04-08 ENCOUNTER — Inpatient Hospital Stay: Admission: RE | Admit: 2016-04-08 | Payer: Self-pay | Source: Ambulatory Visit

## 2016-04-10 DIAGNOSIS — F332 Major depressive disorder, recurrent severe without psychotic features: Secondary | ICD-10-CM | POA: Diagnosis not present

## 2016-04-10 DIAGNOSIS — G8929 Other chronic pain: Secondary | ICD-10-CM | POA: Diagnosis not present

## 2016-04-10 DIAGNOSIS — E1165 Type 2 diabetes mellitus with hyperglycemia: Secondary | ICD-10-CM | POA: Diagnosis not present

## 2016-04-10 DIAGNOSIS — I10 Essential (primary) hypertension: Secondary | ICD-10-CM | POA: Diagnosis not present

## 2016-04-18 DIAGNOSIS — F3181 Bipolar II disorder: Secondary | ICD-10-CM | POA: Diagnosis not present

## 2016-04-28 DIAGNOSIS — Z79891 Long term (current) use of opiate analgesic: Secondary | ICD-10-CM | POA: Diagnosis not present

## 2016-04-28 DIAGNOSIS — G894 Chronic pain syndrome: Secondary | ICD-10-CM | POA: Diagnosis not present

## 2016-04-28 DIAGNOSIS — M961 Postlaminectomy syndrome, not elsewhere classified: Secondary | ICD-10-CM | POA: Diagnosis not present

## 2016-04-28 DIAGNOSIS — M47817 Spondylosis without myelopathy or radiculopathy, lumbosacral region: Secondary | ICD-10-CM | POA: Diagnosis not present

## 2016-04-28 DIAGNOSIS — M5137 Other intervertebral disc degeneration, lumbosacral region: Secondary | ICD-10-CM | POA: Diagnosis not present

## 2016-04-28 DIAGNOSIS — Z79899 Other long term (current) drug therapy: Secondary | ICD-10-CM | POA: Diagnosis not present

## 2016-05-02 DIAGNOSIS — N958 Other specified menopausal and perimenopausal disorders: Secondary | ICD-10-CM | POA: Diagnosis not present

## 2016-05-02 DIAGNOSIS — R232 Flushing: Secondary | ICD-10-CM | POA: Diagnosis not present

## 2016-05-02 DIAGNOSIS — N898 Other specified noninflammatory disorders of vagina: Secondary | ICD-10-CM | POA: Diagnosis not present

## 2016-05-02 DIAGNOSIS — N951 Menopausal and female climacteric states: Secondary | ICD-10-CM | POA: Diagnosis not present

## 2016-05-12 DIAGNOSIS — J020 Streptococcal pharyngitis: Secondary | ICD-10-CM | POA: Diagnosis not present

## 2016-05-12 DIAGNOSIS — R197 Diarrhea, unspecified: Secondary | ICD-10-CM | POA: Diagnosis not present

## 2016-05-16 DIAGNOSIS — Z6834 Body mass index (BMI) 34.0-34.9, adult: Secondary | ICD-10-CM | POA: Diagnosis not present

## 2016-05-16 DIAGNOSIS — Z794 Long term (current) use of insulin: Secondary | ICD-10-CM | POA: Diagnosis not present

## 2016-05-16 DIAGNOSIS — E1165 Type 2 diabetes mellitus with hyperglycemia: Secondary | ICD-10-CM | POA: Diagnosis not present

## 2016-05-23 DIAGNOSIS — N951 Menopausal and female climacteric states: Secondary | ICD-10-CM | POA: Diagnosis not present

## 2016-05-24 DIAGNOSIS — D510 Vitamin B12 deficiency anemia due to intrinsic factor deficiency: Secondary | ICD-10-CM | POA: Diagnosis not present

## 2016-06-22 DIAGNOSIS — N951 Menopausal and female climacteric states: Secondary | ICD-10-CM | POA: Diagnosis not present

## 2016-06-27 ENCOUNTER — Encounter (HOSPITAL_COMMUNITY): Payer: Self-pay | Admitting: Emergency Medicine

## 2016-06-27 ENCOUNTER — Ambulatory Visit (HOSPITAL_COMMUNITY)
Admission: EM | Admit: 2016-06-27 | Discharge: 2016-06-27 | Disposition: A | Discharge status: Home / Self Care | Payer: BLUE CROSS/BLUE SHIELD | Attending: Family Medicine | Admitting: Family Medicine

## 2016-06-27 DIAGNOSIS — G894 Chronic pain syndrome: Secondary | ICD-10-CM | POA: Diagnosis not present

## 2016-06-27 MED ORDER — KETOROLAC TROMETHAMINE 30 MG/ML IJ SOLN
30.0000 mg | Freq: Once | INTRAMUSCULAR | Status: AC
  Administered 2016-06-27: 30 mg via INTRAMUSCULAR

## 2016-06-27 MED ORDER — METHYLPREDNISOLONE ACETATE 80 MG/ML IJ SUSP
INTRAMUSCULAR | Status: AC
  Filled 2016-06-27: qty 1

## 2016-06-27 MED ORDER — METHYLPREDNISOLONE ACETATE 80 MG/ML IJ SUSP
80.0000 mg | Freq: Once | INTRAMUSCULAR | Status: AC
  Administered 2016-06-27: 80 mg via INTRAMUSCULAR

## 2016-06-27 MED ORDER — KETOROLAC TROMETHAMINE 30 MG/ML IJ SOLN
INTRAMUSCULAR | Status: AC
  Filled 2016-06-27: qty 1

## 2016-06-27 NOTE — Discharge Instructions (Signed)
See your doctor as neeeded.

## 2016-06-27 NOTE — ED Provider Notes (Signed)
[facility]    CSN: [id number] Arrival date & time: 06/27/16  1024     History   Chief Complaint Chief Complaint  Patient presents with  . Fibromyalgia    HPI Karen Stein is a 46 y.o. female.   The history is provided by the patient.  Back Pain  Location:  Generalized Quality:  Stiffness Pain severity:  Moderate Onset quality:  Gradual Progression:  Worsening (pt with chronic pain issues, fibromyalgia) Chronicity:  New Context: emotional stress and physical stress   Relieved by:  Nothing Worsened by:  Nothing Associated symptoms: headaches and leg pain   Associated symptoms: no abdominal pain, no abdominal swelling, no chest pain, no dysuria, no fever, no numbness, no paresthesias and no pelvic pain     Past Medical History:  Diagnosis Date  . Anemia   . Anxiety   . Arthritis   . Chronic pain   . Complication of anesthesia    anesthesia awareness,  . DDD (degenerative disc disease)   . DDD (degenerative disc disease)   . Degenerative disc disease   . Degenerative disc disease   . Diabetes mellitus    Type 2 insulin resistant  . Dysrhythmia    stress and caffeine related  . Endometriosis   . Fibroids   . Fibromyalgia   . GERD (gastroesophageal reflux disease)   . High cholesterol   . Insomnia   . Leg cramps   . PCOS (polycystic ovarian syndrome)   . Scoliosis   . Sickle cell trait [facility])     Patient Active Problem List   Diagnosis Date Noted  . Severe episode of recurrent major depressive disorder, without psychotic features ([place])   . Major depressive disorder, recurrent severe without psychotic features ([place]) 10/12/2015  . Depression, major, severe recurrence ([place]) 10/12/2015  . Degenerative disc disease   . Sickle cell trait ([place])   . DDD (degenerative disc disease)     Past Surgical History:  Procedure Laterality Date  . BACK SURGERY  2010  . BIOPSY BREAST    . CESAREAN SECTION    . DILATATION & CURETTAGE/HYSTEROSCOPY  WITH MYOSURE N/A 01/01/2015   Procedure: DILATATION & CURETTAGE/HYSTEROSCOPY WITH MYOSURE;  Surgeon: Crawford Givens, MD;  Location: [facility];  Service: Gynecology;  Laterality: N/A;  . LAPAROSCOPY  2002 or 2003  . TOE SURGERY  2008  . WISDOM TOOTH EXTRACTION      OB History    Gravida Para Term Preterm AB Living   4 2 1 1 2 1    SAB TAB Ectopic Multiple Live Births   2               Home Medications    Prior to Admission medications   Medication Sig Start Date End Date Taking? Authorizing Provider  Pregabalin (LYRICA PO) Take 60 mg by mouth.   Yes Historical Provider, MD  Tapentadol HCl (NUCYNTA PO) Take 75 mg by mouth.   Yes Historical Provider, MD  DULoxetine (CYMBALTA) 60 MG capsule Take 1 capsule (60 mg total) by mouth daily. Patient not taking: Reported on 06/27/2016 10/18/15   Kerrie Buffalo, NP  gabapentin (NEURONTIN) 100 MG capsule Take 2 capsules (200 mg total) by mouth 3 (three) times daily. Patient not taking: Reported on 06/27/2016 10/18/15   Kerrie Buffalo, NP  Insulin Degludec-Liraglutide (XULTOPHY [place]) Inject 20 Units into the skin daily.    Historical Provider, MD  Insulin Regular Human (NOVOLIN R IJ) Inject 2-4 Units as directed.  Historical Provider, MD  Methocarbamol (ROBAXIN PO) Take by mouth.    Historical Provider, MD  morphine (MSIR) 15 MG tablet Take 15 mg by mouth every 8 (eight) hours as needed for severe pain.    Historical Provider, MD  Morphine-Naltrexone (EMBEDA) 30-1.2 MG CPCR Take 1.2 mg by mouth every morning.    Historical Provider, MD  valACYclovir (VALTREX) 500 MG tablet Take 1 tablet (500 mg total) by mouth daily. 10/18/15   Kerrie Buffalo, NP    Family History Family History  Problem Relation Age of Onset  . Diabetes Father   . Hypertension Father   . Hyperlipidemia Father   . Kidney failure Father   . Diabetes Paternal Grandfather   . Hypertension Paternal Grandfather   . Hyperlipidemia Paternal Grandfather   . Diabetes Paternal Grandmother     . Hypertension Paternal Grandmother   . Hyperlipidemia Paternal Grandmother   . Alzheimer's disease Maternal Grandmother   . Brain cancer Maternal Grandfather     Social History Social History  Substance Use Topics  . Smoking status: Never Smoker  . Smokeless tobacco: Never Used  . Alcohol use No     Allergies   Shellfish allergy and Sulfa antibiotics   Review of Systems Review of Systems  Constitutional: Positive for fatigue. Negative for fever.  HENT: Negative.   Respiratory: Negative.   Cardiovascular: Negative.  Negative for chest pain.  Gastrointestinal: Negative.  Negative for abdominal pain.  Genitourinary: Negative.  Negative for dysuria and pelvic pain.  Musculoskeletal: Positive for back pain and myalgias.  Skin: Negative.   Neurological: Positive for headaches. Negative for numbness and paresthesias.  All other systems reviewed and are negative.    Physical Exam Triage Vital Signs ED Triage Vitals [06/27/16 1059]  Enc Vitals Group     BP 116/77     Pulse Rate 100     Resp 22     Temp 98.5 F (36.9 C)     Temp Source Oral     SpO2 98 %     Weight      Height      Head Circumference      Peak Flow      Pain Score 9     Pain Loc      Pain Edu?      Excl. in [place]?    No data found.   Updated Vital Signs BP 116/77 (BP Location: Right Arm) Comment: large cuff  Pulse 100   Temp 98.5 F (36.9 C) (Oral)   Resp 22   SpO2 98%   Visual Acuity Right Eye Distance:   Left Eye Distance:   Bilateral Distance:    Right Eye Near:   Left Eye Near:    Bilateral Near:     Physical Exam  Constitutional: She is oriented to person, place, and time. She appears well-developed and well-nourished. She appears distressed.  Abdominal: Soft. Bowel sounds are normal.  Musculoskeletal: She exhibits tenderness and deformity.  Neurological: She is alert and oriented to person, place, and time.  Skin: Skin is warm and dry.  Nursing note and vitals  reviewed.    UC Treatments / Results  Labs (all labs ordered are listed, but only abnormal results are displayed) Labs Reviewed - No data to display  EKG  EKG Interpretation None       Radiology No results found.  Procedures Procedures (including critical care time)  Medications Ordered in UC Medications  ketorolac (TORADOL) 30 MG/ML injection 30 mg (  not administered)  methylPREDNISolone acetate (DEPO-MEDROL) injection 80 mg (not administered)     Initial Impression / Assessment and Plan / UC Course  I have reviewed the triage vital signs and the nursing notes.  Pertinent labs & imaging results that were available during my care of the patient were reviewed by me and considered in my medical decision making (see chart for details).       Final Clinical Impressions(s) / UC Diagnoses   Final diagnoses:  Chronic pain syndrome    New Prescriptions New Prescriptions   No medications on file     Billy Fischer, MD 06/27/16 1140

## 2016-06-27 NOTE — ED Triage Notes (Signed)
Patient having chronic pain, normally managed by pain management.  Patient here for a shot.

## 2016-07-06 DIAGNOSIS — E1165 Type 2 diabetes mellitus with hyperglycemia: Secondary | ICD-10-CM | POA: Diagnosis not present

## 2016-07-06 DIAGNOSIS — Z794 Long term (current) use of insulin: Secondary | ICD-10-CM | POA: Diagnosis not present

## 2016-07-13 DIAGNOSIS — E1165 Type 2 diabetes mellitus with hyperglycemia: Secondary | ICD-10-CM | POA: Diagnosis not present

## 2016-07-13 DIAGNOSIS — Z794 Long term (current) use of insulin: Secondary | ICD-10-CM | POA: Diagnosis not present

## 2016-07-25 DIAGNOSIS — E1165 Type 2 diabetes mellitus with hyperglycemia: Secondary | ICD-10-CM | POA: Diagnosis not present

## 2016-07-25 DIAGNOSIS — Z794 Long term (current) use of insulin: Secondary | ICD-10-CM | POA: Diagnosis not present

## 2016-08-07 DIAGNOSIS — J069 Acute upper respiratory infection, unspecified: Secondary | ICD-10-CM | POA: Diagnosis not present

## 2016-08-16 DIAGNOSIS — N951 Menopausal and female climacteric states: Secondary | ICD-10-CM | POA: Diagnosis not present

## 2016-09-07 DIAGNOSIS — E1142 Type 2 diabetes mellitus with diabetic polyneuropathy: Secondary | ICD-10-CM | POA: Diagnosis not present

## 2016-09-07 DIAGNOSIS — Z794 Long term (current) use of insulin: Secondary | ICD-10-CM | POA: Diagnosis not present

## 2016-09-07 DIAGNOSIS — R202 Paresthesia of skin: Secondary | ICD-10-CM | POA: Diagnosis not present

## 2016-09-07 DIAGNOSIS — Z79899 Other long term (current) drug therapy: Secondary | ICD-10-CM | POA: Diagnosis not present

## 2016-09-21 DIAGNOSIS — E1165 Type 2 diabetes mellitus with hyperglycemia: Secondary | ICD-10-CM | POA: Diagnosis not present

## 2016-09-21 DIAGNOSIS — Z794 Long term (current) use of insulin: Secondary | ICD-10-CM | POA: Diagnosis not present

## 2016-09-21 DIAGNOSIS — R300 Dysuria: Secondary | ICD-10-CM | POA: Diagnosis not present

## 2016-09-21 DIAGNOSIS — Z6835 Body mass index (BMI) 35.0-35.9, adult: Secondary | ICD-10-CM | POA: Diagnosis not present

## 2016-10-04 DIAGNOSIS — Z79899 Other long term (current) drug therapy: Secondary | ICD-10-CM | POA: Diagnosis not present

## 2016-10-04 DIAGNOSIS — N390 Urinary tract infection, site not specified: Secondary | ICD-10-CM | POA: Diagnosis not present

## 2016-10-04 DIAGNOSIS — I10 Essential (primary) hypertension: Secondary | ICD-10-CM | POA: Diagnosis not present

## 2016-10-04 DIAGNOSIS — G8929 Other chronic pain: Secondary | ICD-10-CM | POA: Diagnosis not present

## 2016-10-30 ENCOUNTER — Encounter (HOSPITAL_COMMUNITY): Payer: Self-pay | Admitting: Emergency Medicine

## 2016-10-30 ENCOUNTER — Emergency Department (HOSPITAL_COMMUNITY): Payer: BLUE CROSS/BLUE SHIELD

## 2016-10-30 ENCOUNTER — Emergency Department (HOSPITAL_COMMUNITY)
Admission: EM | Admit: 2016-10-30 | Discharge: 2016-10-31 | Disposition: A | Discharge status: Home / Self Care | Payer: BLUE CROSS/BLUE SHIELD | Attending: Emergency Medicine | Admitting: Emergency Medicine

## 2016-10-30 DIAGNOSIS — E119 Type 2 diabetes mellitus without complications: Secondary | ICD-10-CM | POA: Diagnosis not present

## 2016-10-30 DIAGNOSIS — R079 Chest pain, unspecified: Secondary | ICD-10-CM | POA: Diagnosis not present

## 2016-10-30 DIAGNOSIS — Z794 Long term (current) use of insulin: Secondary | ICD-10-CM | POA: Diagnosis not present

## 2016-10-30 DIAGNOSIS — R0789 Other chest pain: Secondary | ICD-10-CM | POA: Insufficient documentation

## 2016-10-30 LAB — BASIC METABOLIC PANEL
Anion gap: 10 (ref 5–15)
BUN: 11 mg/dL (ref 6–20)
CO2: 25 mmol/L (ref 22–32)
Calcium: 9.2 mg/dL (ref 8.9–10.3)
Chloride: 100 mmol/L — ABNORMAL LOW (ref 101–111)
Creatinine, Ser: 0.83 mg/dL (ref 0.44–1.00)
GFR calc Af Amer: >60 mL/min (ref >60)
Glucose, Bld: 346 mg/dL — ABNORMAL HIGH (ref 65–99)
Potassium: 3.9 mmol/L (ref 3.5–5.1)
Sodium: 135 mmol/L (ref 135–145)

## 2016-10-30 LAB — CBC
HEMATOCRIT: 40.7 % (ref 36.0–46.0)
HEMOGLOBIN: 13.9 g/dL (ref 12.0–15.0)
MCH: 27.7 pg (ref 26.0–34.0)
MCHC: 34.2 g/dL (ref 30.0–36.0)
MCV: 81.1 fL (ref 78.0–100.0)
Platelets: 383 10*3/uL (ref 150–400)
RBC: 5.02 MIL/uL (ref 3.87–5.11)
RDW: 13 % (ref 11.5–15.5)
WBC: 8.3 10*3/uL (ref 4.0–10.5)

## 2016-10-30 LAB — POCT I-STAT TROPONIN I: Troponin i, poc: 0 ng/mL (ref 0.00–0.08)

## 2016-10-30 MED ORDER — KETOROLAC TROMETHAMINE 30 MG/ML IJ SOLN
15.0000 mg | Freq: Once | INTRAMUSCULAR | Status: AC
  Administered 2016-10-31: 15 mg via INTRAVENOUS
  Filled 2016-10-30: qty 1

## 2016-10-30 NOTE — ED Triage Notes (Addendum)
Patient c/o left chest pain that is under breast and radiates to posterior rib cage and shoulder area. Patient reports pain has been intermittent for a week.  Patient reports pain is worse with movement.  family history of CVA and MIs so patient has been taking 2 ASA 81mg  daily

## 2016-10-30 NOTE — ED Provider Notes (Signed)
[place] DEPT Provider Note   CSN: [id number] Arrival date & time: 10/30/16  1831     History   Chief Complaint Chief Complaint  Patient presents with  . Chest Pain    HPI Karen Stein is a 46 y.o. female.  The history is provided by the patient.  Chest Pain   This is a new problem. The current episode started more than 1 week ago. The problem occurs constantly. The problem has not changed since onset.The pain is associated with movement. The pain is present in the lateral region. The pain is moderate. The pain does not radiate. The symptoms are aggravated by certain positions. Pertinent negatives include no abdominal pain, no diaphoresis, no irregular heartbeat, no palpitations, no shortness of breath, no sputum production, no syncope, no vomiting and no weakness. She has tried nothing for the symptoms. The treatment provided no relief. Risk factors include obesity.  Pertinent negatives for past medical history include no aneurysm and no Marfan's syndrome.    Past Medical History:  Diagnosis Date  . Anemia   . Anxiety   . Arthritis   . Chronic pain   . Complication of anesthesia    anesthesia awareness,  . DDD (degenerative disc disease)   . DDD (degenerative disc disease)   . Degenerative disc disease   . Degenerative disc disease   . Diabetes mellitus    Type 2 insulin resistant  . Dysrhythmia    stress and caffeine related  . Endometriosis   . Fibroids   . Fibromyalgia   . GERD (gastroesophageal reflux disease)   . High cholesterol   . Insomnia   . Leg cramps   . PCOS (polycystic ovarian syndrome)   . Scoliosis   . Sickle cell trait [facility])     Patient Active Problem List   Diagnosis Date Noted  . Severe episode of recurrent major depressive disorder, without psychotic features ([place])   . Major depressive disorder, recurrent severe without psychotic features ([place]) 10/12/2015  . Depression, major, severe recurrence ([place]) 10/12/2015  . Degenerative  disc disease   . Sickle cell trait ([place])   . DDD (degenerative disc disease)     Past Surgical History:  Procedure Laterality Date  . BACK SURGERY  2010  . BIOPSY BREAST    . CESAREAN SECTION    . DILATATION & CURETTAGE/HYSTEROSCOPY WITH MYOSURE N/A 01/01/2015   Procedure: DILATATION & CURETTAGE/HYSTEROSCOPY WITH MYOSURE;  Surgeon: Crawford Givens, MD;  Location: [facility];  Service: Gynecology;  Laterality: N/A;  . LAPAROSCOPY  2002 or 2003  . TOE SURGERY  2008  . WISDOM TOOTH EXTRACTION      OB History    Gravida Para Term Preterm AB Living   4 2 1 1 2 1    SAB TAB Ectopic Multiple Live Births   2               Home Medications    Prior to Admission medications   Medication Sig Start Date End Date Taking? Authorizing Provider  DULoxetine (CYMBALTA) 60 MG capsule Take 1 capsule (60 mg total) by mouth daily. Patient not taking: Reported on 06/27/2016 10/18/15   Kerrie Buffalo, NP  gabapentin (NEURONTIN) 100 MG capsule Take 2 capsules (200 mg total) by mouth 3 (three) times daily. Patient not taking: Reported on 06/27/2016 10/18/15   Kerrie Buffalo, NP  Insulin Degludec-Liraglutide (XULTOPHY [place]) Inject 20 Units into the skin daily.    [provider]  Insulin Regular Human (NOVOLIN R IJ)  Inject 2-4 Units as directed.    [provider]  Methocarbamol (ROBAXIN PO) Take by mouth.    [provider]  morphine (MSIR) 15 MG tablet Take 15 mg by mouth every 8 (eight) hours as needed for severe pain.    [provider]  Morphine-Naltrexone (EMBEDA) 30-1.2 MG CPCR Take 1.2 mg by mouth every morning.    [provider]  Pregabalin (LYRICA PO) Take 60 mg by mouth.    [provider]  Tapentadol HCl (NUCYNTA PO) Take 75 mg by mouth.    [provider]  valACYclovir (VALTREX) 500 MG tablet Take 1 tablet (500 mg total) by mouth daily. 10/18/15   Kerrie Buffalo, NP    Family History Family History  Problem Relation Age of Onset  .  Diabetes Father   . Hypertension Father   . Hyperlipidemia Father   . Kidney failure Father   . Diabetes Paternal Grandfather   . Hypertension Paternal Grandfather   . Hyperlipidemia Paternal Grandfather   . Diabetes Paternal Grandmother   . Hypertension Paternal Grandmother   . Hyperlipidemia Paternal Grandmother   . Alzheimer's disease Maternal Grandmother   . Brain cancer Maternal Grandfather     Social History Social History  Substance Use Topics  . Smoking status: Never Smoker  . Smokeless tobacco: Never Used  . Alcohol use No     Allergies   Macrobid [nitrofurantoin macrocrystal]; Shellfish allergy; and Sulfa antibiotics   Review of Systems Review of Systems  Constitutional: Negative for diaphoresis.  Respiratory: Negative for sputum production and shortness of breath.   Cardiovascular: Positive for chest pain. Negative for palpitations, leg swelling and syncope.  Gastrointestinal: Negative for abdominal pain and vomiting.  Neurological: Negative for weakness.  All other systems reviewed and are negative.    Physical Exam Updated Vital Signs BP 140/88 (BP Location: Left Arm)   Pulse (!) 103   Temp 98.8 F (37.1 C) (Oral)   Resp 16   Ht 5\' 8"  (1.727 m)   Wt 106.7 kg (235 lb 4.8 oz)   LMP 11/13/2015 (Exact Date)   SpO2 100%   BMI 35.78 kg/m   Physical Exam  Constitutional: She is oriented to person, place, and time. She appears well-developed and well-nourished. No distress.  HENT:  Head: Normocephalic and atraumatic.  Mouth/Throat: No oropharyngeal exudate.  Eyes: Conjunctivae and EOM are normal. Pupils are equal, round, and reactive to light.  Neck: Normal range of motion. Neck supple.  Cardiovascular: Normal rate, regular rhythm, normal heart sounds and intact distal pulses.   Pulmonary/Chest: Effort normal and breath sounds normal. She has no wheezes. She has no rales.  Abdominal: Soft. Bowel sounds are normal. She exhibits no mass. There is no  tenderness. There is no rebound and no guarding.  Musculoskeletal: Normal range of motion.  Neurological: She is alert and oriented to person, place, and time.  Skin: Skin is warm and dry. Capillary refill takes less than 2 seconds.  Psychiatric: She has a normal mood and affect.     ED Treatments / Results   Vitals:   10/30/16 1843 10/30/16 2235  BP: 132/89 140/88  Pulse: (!) 103 (!) 103  Resp: 16 16  Temp: 98.3 F (36.8 C) 98.8 F (37.1 C)    Labs (all labs ordered are listed, but only abnormal results are displayed)  Results for orders placed or performed during the hospital encounter of 02/63/78  Basic metabolic panel  Result Value Ref Range   Sodium  135 135 - 145 mmol/L   Potassium 3.9 3.5 - 5.1 mmol/L   Chloride 100 (L) 101 - 111 mmol/L   CO2 25 22 - 32 mmol/L   Glucose, Bld 346 (H) 65 - 99 mg/dL   BUN 11 6 - 20 mg/dL   Creatinine, Ser 0.83 0.44 - 1.00 mg/dL   Calcium 9.2 8.9 - 10.3 mg/dL   GFR calc non Af Amer >60 >60 mL/min   GFR calc Af Amer >60 >60 mL/min   Anion gap 10 5 - 15  CBC  Result Value Ref Range   WBC 8.3 4.0 - 10.5 K/uL   RBC 5.02 3.87 - 5.11 MIL/uL   Hemoglobin 13.9 12.0 - 15.0 g/dL   HCT 40.7 36.0 - 46.0 %   MCV 81.1 78.0 - 100.0 fL   MCH 27.7 26.0 - 34.0 pg   MCHC 34.2 30.0 - 36.0 g/dL   RDW 13.0 11.5 - 15.5 %   Platelets 383 150 - 400 K/uL  POCT i-Stat troponin I  Result Value Ref Range   Troponin i, poc 0.00 0.00 - 0.08 ng/mL   Comment 3           Dg Chest 2 View  Result Date: 10/30/2016 CLINICAL DATA:  Left chest pain for 1 week EXAM: CHEST  2 VIEW COMPARISON:  January 06, 2014 FINDINGS: The heart size and mediastinal contours are within normal limits. There is mild linear scarring or atelectasis of the medial right lung base. There is no focal pneumonia or pleural effusion. The visualized skeletal structures are unremarkable. IMPRESSION: No active cardiopulmonary disease. Mild linear scarring or atelectasis of the medial right lung  base. Electronically Signed   By: Abelardo Diesel M.D.   On: 10/30/2016 19:21    EKG  EKG Interpretation  Date/Time:  Monday October 30 2016 18:39:02 EDT Ventricular Rate:  110 PR Interval:    QRS Duration: 78 QT Interval:  333 QTC Calculation: 451 R Axis:   69 Text Interpretation:  Sinus tachycardia Borderline T abnormalities, diffuse leads Confirmed by Dory Horn) on 10/30/2016 11:09:54 PM       Radiology Dg Chest 2 View  Result Date: 10/30/2016 CLINICAL DATA:  Left chest pain for 1 week EXAM: CHEST  2 VIEW COMPARISON:  January 06, 2014 FINDINGS: The heart size and mediastinal contours are within normal limits. There is mild linear scarring or atelectasis of the medial right lung base. There is no focal pneumonia or pleural effusion. The visualized skeletal structures are unremarkable. IMPRESSION: No active cardiopulmonary disease. Mild linear scarring or atelectasis of the medial right lung base. Electronically Signed   By: Abelardo Diesel M.D.   On: 10/30/2016 19:21    Procedures Procedures (including critical care time)  Medications Ordered in ED Medications  ketorolac (TORADOL) 30 MG/ML injection 15 mg (not administered)      Final Clinical Impressions(s) / ED Diagnoses  Ruled out for MI PE and PNA, exam and work up consistent with MSK pain.  Follow up with your PMD for recheck.  Strict return precautions given.  Return immediately for fever >101, altered level of consciousness,bleeding or any concerns. Follow up with your own doctor for ongoing concerns.   The patient is nontoxic-appearing on exam and vital signs are within normal limits.   I have reviewed the triage vital signs and the nursing notes. Pertinent labs &imaging results that were available during my care of the patient were reviewed by me and considered in my medical decision  making (see chart for details).  After history, exam, and medical workup I feel the patient has been appropriately  medically screened and is safe for discharge home. Pertinent diagnoses were discussed with the patient. Patient was given return precautions.     Zamir Staples, MD 10/31/16 0201

## 2016-10-31 ENCOUNTER — Emergency Department (HOSPITAL_COMMUNITY): Payer: BLUE CROSS/BLUE SHIELD

## 2016-10-31 ENCOUNTER — Encounter (HOSPITAL_COMMUNITY): Payer: Self-pay

## 2016-10-31 DIAGNOSIS — R079 Chest pain, unspecified: Secondary | ICD-10-CM | POA: Diagnosis not present

## 2016-10-31 LAB — POCT I-STAT TROPONIN I: TROPONIN I, POC: 0 ng/mL (ref 0.00–0.08)

## 2016-10-31 MED ORDER — IOPAMIDOL (ISOVUE-370) INJECTION 76%
100.0000 mL | Freq: Once | INTRAVENOUS | Status: AC | PRN
  Administered 2016-10-31: 100 mL via INTRAVENOUS

## 2016-10-31 MED ORDER — IOPAMIDOL (ISOVUE-370) INJECTION 76%
INTRAVENOUS | Status: AC
  Administered 2016-10-31: 100 mL via INTRAVENOUS
  Filled 2016-10-31: qty 100

## 2016-10-31 MED ORDER — IBUPROFEN 800 MG PO TABS: 800.0000 mg | ORAL_TABLET | Freq: Three times a day (TID) | ORAL | 0 refills | Status: DC

## 2016-10-31 NOTE — ED Notes (Signed)
Pt is ambulating to the restroom with a steady gait.

## 2016-12-11 DIAGNOSIS — E119 Type 2 diabetes mellitus without complications: Secondary | ICD-10-CM | POA: Diagnosis not present

## 2016-12-11 DIAGNOSIS — M797 Fibromyalgia: Secondary | ICD-10-CM | POA: Diagnosis not present

## 2016-12-11 DIAGNOSIS — Z794 Long term (current) use of insulin: Secondary | ICD-10-CM | POA: Diagnosis not present

## 2016-12-11 DIAGNOSIS — E785 Hyperlipidemia, unspecified: Secondary | ICD-10-CM | POA: Diagnosis not present

## 2016-12-22 DIAGNOSIS — M545 Low back pain: Secondary | ICD-10-CM | POA: Diagnosis not present

## 2016-12-22 DIAGNOSIS — E119 Type 2 diabetes mellitus without complications: Secondary | ICD-10-CM | POA: Diagnosis not present

## 2016-12-22 DIAGNOSIS — E785 Hyperlipidemia, unspecified: Secondary | ICD-10-CM | POA: Diagnosis not present

## 2016-12-22 DIAGNOSIS — M797 Fibromyalgia: Secondary | ICD-10-CM | POA: Diagnosis not present

## 2017-01-02 DIAGNOSIS — M797 Fibromyalgia: Secondary | ICD-10-CM | POA: Diagnosis not present

## 2017-01-02 DIAGNOSIS — E785 Hyperlipidemia, unspecified: Secondary | ICD-10-CM | POA: Diagnosis not present

## 2017-01-02 DIAGNOSIS — E119 Type 2 diabetes mellitus without complications: Secondary | ICD-10-CM | POA: Diagnosis not present

## 2017-01-02 DIAGNOSIS — M545 Low back pain: Secondary | ICD-10-CM | POA: Diagnosis not present

## 2017-01-14 ENCOUNTER — Emergency Department (HOSPITAL_BASED_OUTPATIENT_CLINIC_OR_DEPARTMENT_OTHER)
Admission: EM | Admit: 2017-01-14 | Discharge: 2017-01-14 | Disposition: A | Discharge status: Home / Self Care | Payer: BLUE CROSS/BLUE SHIELD | Attending: Physician Assistant | Admitting: Physician Assistant

## 2017-01-14 ENCOUNTER — Encounter (HOSPITAL_BASED_OUTPATIENT_CLINIC_OR_DEPARTMENT_OTHER): Payer: Self-pay

## 2017-01-14 DIAGNOSIS — Z79899 Other long term (current) drug therapy: Secondary | ICD-10-CM | POA: Diagnosis not present

## 2017-01-14 DIAGNOSIS — E1165 Type 2 diabetes mellitus with hyperglycemia: Secondary | ICD-10-CM | POA: Diagnosis not present

## 2017-01-14 DIAGNOSIS — Z794 Long term (current) use of insulin: Secondary | ICD-10-CM | POA: Insufficient documentation

## 2017-01-14 DIAGNOSIS — R739 Hyperglycemia, unspecified: Secondary | ICD-10-CM

## 2017-01-14 LAB — CBC WITH DIFFERENTIAL/PLATELET
Basophils Absolute: 0.1 10*3/uL (ref 0.0–0.1)
Basophils Relative: 1 %
EOS ABS: 0.2 10*3/uL (ref 0.0–0.7)
EOS PCT: 2 %
HCT: 42 % (ref 36.0–46.0)
HEMOGLOBIN: 14.2 g/dL (ref 12.0–15.0)
Lymphocytes Relative: 33 %
Lymphs Abs: 2.4 10*3/uL (ref 0.7–4.0)
MCH: 27.8 pg (ref 26.0–34.0)
MCHC: 33.8 g/dL (ref 30.0–36.0)
MCV: 82.2 fL (ref 78.0–100.0)
Monocytes Absolute: 0.4 10*3/uL (ref 0.1–1.0)
Monocytes Relative: 5 %
NEUTROS ABS: 4.3 10*3/uL (ref 1.7–7.7)
Neutrophils Relative %: 59 %
Platelets: 398 10*3/uL (ref 150–400)
RBC: 5.11 MIL/uL (ref 3.87–5.11)
RDW: 13.3 % (ref 11.5–15.5)
WBC: 7.3 10*3/uL (ref 4.0–10.5)

## 2017-01-14 LAB — BASIC METABOLIC PANEL
Anion gap: 6 (ref 5–15)
BUN: 12 mg/dL (ref 6–20)
CHLORIDE: 102 mmol/L (ref 101–111)
CO2: 27 mmol/L (ref 22–32)
CREATININE: 0.91 mg/dL (ref 0.44–1.00)
Calcium: 9 mg/dL (ref 8.9–10.3)
GFR calc Af Amer: >60 mL/min (ref >60)
GFR calc non Af Amer: >60 mL/min (ref >60)
GLUCOSE: 373 mg/dL — AB (ref 65–99)
Potassium: 3.8 mmol/L (ref 3.5–5.1)
SODIUM: 135 mmol/L (ref 135–145)

## 2017-01-14 LAB — CBG MONITORING, ED: Glucose-Capillary: 388 mg/dL — ABNORMAL HIGH (ref 65–99)

## 2017-01-14 MED ORDER — SODIUM CHLORIDE 0.9 % IV SOLN
Freq: Once | INTRAVENOUS | Status: AC
Start: 2017-01-14 — End: 2017-01-14
  Administered 2017-01-14: 10:00:00 via INTRAVENOUS

## 2017-01-14 MED ORDER — SODIUM CHLORIDE 0.9 % IV BOLUS (SEPSIS)
1000.0000 mL | Freq: Once | INTRAVENOUS | Status: AC
  Administered 2017-01-14: 1000 mL via INTRAVENOUS

## 2017-01-14 MED ORDER — FLUCONAZOLE 200 MG PO TABS: 200.0000 mg | ORAL_TABLET | Freq: Every day | ORAL | 0 refills | Status: AC

## 2017-01-14 NOTE — Discharge Instructions (Signed)
Please return as needed and follow up with your PCPl.

## 2017-01-14 NOTE — ED Notes (Addendum)
[id number] eported to [employer] [facility]

## 2017-01-14 NOTE — ED Provider Notes (Signed)
[place] DEPT [facility] Provider Note   CSN: [id number] Arrival date & time: 01/14/17  4034     History   Chief Complaint Chief Complaint  Patient presents with  . Hyperglycemia    HPI Karen Stein is a 46 y.o. female.  HPI   Patient a 46 year old female presenting with hyperglycemia. Patient has fibromyalgia, seizure pain clinic, and reports she's very "resistant". She takes both IM insulin and aerosolized insulin.  Patient not having vomiting or other focal symptoms. She reports that sometimes she "just gets dehydrated and needs to come in for fluids".  Patient appears very well.  Past Medical History:  Diagnosis Date  . Anemia   . Anxiety   . Arthritis   . Chronic pain   . Complication of anesthesia    anesthesia awareness,  . DDD (degenerative disc disease)   . DDD (degenerative disc disease)   . Degenerative disc disease   . Degenerative disc disease   . Diabetes mellitus    Type 2 insulin resistant  . Dysrhythmia    stress and caffeine related  . Endometriosis   . Fibroids   . Fibromyalgia   . GERD (gastroesophageal reflux disease)   . High cholesterol   . Insomnia   . Leg cramps   . PCOS (polycystic ovarian syndrome)   . Scoliosis   . Sickle cell trait [facility])     Patient Active Problem List   Diagnosis Date Noted  . Severe episode of recurrent major depressive disorder, without psychotic features ([place])   . Major depressive disorder, recurrent severe without psychotic features ([place]) 10/12/2015  . Depression, major, severe recurrence ([place]) 10/12/2015  . Degenerative disc disease   . Sickle cell trait ([place])   . DDD (degenerative disc disease)     Past Surgical History:  Procedure Laterality Date  . BACK SURGERY  2010  . BIOPSY BREAST    . CESAREAN SECTION    . DILATATION & CURETTAGE/HYSTEROSCOPY WITH MYOSURE N/A 01/01/2015   Procedure: DILATATION & CURETTAGE/HYSTEROSCOPY WITH MYOSURE;  Surgeon: Crawford Givens, MD;  Location: [facility];   Service: Gynecology;  Laterality: N/A;  . LAPAROSCOPY  2002 or 2003  . TOE SURGERY  2008  . WISDOM TOOTH EXTRACTION      OB History    Gravida Para Term Preterm AB Living   4 2 1 1 2 1    SAB TAB Ectopic Multiple Live Births   2               Home Medications    Prior to Admission medications   Medication Sig Start Date End Date Taking? Authorizing Provider  DULoxetine (CYMBALTA) 60 MG capsule Take 1 capsule (60 mg total) by mouth daily. 10/18/15  Yes Kerrie Buffalo, NP  Insulin Regular Human (AFREZZA IN) Inhale into the lungs.   Yes [provider]  NABUMETONE PO Take by mouth.   Yes [provider]  valACYclovir (VALTREX) 500 MG tablet Take 1 tablet (500 mg total) by mouth daily. 10/18/15  Yes Kerrie Buffalo, NP  gabapentin (NEURONTIN) 100 MG capsule Take 2 capsules (200 mg total) by mouth 3 (three) times daily. Patient not taking: Reported on 06/27/2016 10/18/15   Kerrie Buffalo, NP  ibuprofen (ADVIL,MOTRIN) 800 MG tablet Take 1 tablet (800 mg total) by mouth 3 (three) times daily. 10/31/16   Palumbo, April, MD  Insulin Degludec-Liraglutide (XULTOPHY [place]) Inject 20 Units into the skin daily.    [provider]  Insulin Regular Human (NOVOLIN R IJ)  Inject 2-4 Units as directed.    [provider]  Methocarbamol (ROBAXIN PO) Take by mouth.    [provider]  morphine (MSIR) 15 MG tablet Take 15 mg by mouth every 8 (eight) hours as needed for severe pain.    [provider]  Morphine-Naltrexone (EMBEDA) 30-1.2 MG CPCR Take 1.2 mg by mouth every morning.    [provider]  Pregabalin (LYRICA PO) Take 150 mg by mouth 2 (two) times daily.     [provider]  Tapentadol HCl (NUCYNTA PO) Take 75 mg by mouth.    [provider]    Family History Family History  Problem Relation Age of Onset  . Diabetes Father   . Hypertension Father   . Hyperlipidemia Father   . Kidney failure Father   . Diabetes  Paternal Grandfather   . Hypertension Paternal Grandfather   . Hyperlipidemia Paternal Grandfather   . Diabetes Paternal Grandmother   . Hypertension Paternal Grandmother   . Hyperlipidemia Paternal Grandmother   . Alzheimer's disease Maternal Grandmother   . Brain cancer Maternal Grandfather     Social History Social History  Substance Use Topics  . Smoking status: Never Smoker  . Smokeless tobacco: Never Used  . Alcohol use No     Allergies   Macrobid [nitrofurantoin macrocrystal]; Shellfish allergy; and Sulfa antibiotics   Review of Systems Review of Systems  Constitutional: Positive for fatigue. Negative for activity change.  Respiratory: Negative for shortness of breath.   Cardiovascular: Negative for chest pain.  Gastrointestinal: Negative for abdominal pain.     Physical Exam Updated Vital Signs BP 111/74   Pulse 87   Temp 98 F (36.7 C) (Oral)   Resp 18   Ht 5\' 8"  (1.727 m)   Wt 105.7 kg (233 lb)   LMP 11/13/2015 (Exact Date)   SpO2 99%   BMI 35.43 kg/m   Physical Exam  Constitutional: She is oriented to person, place, and time. She appears well-developed and well-nourished.  HENT:  Head: Normocephalic and atraumatic.  Eyes: Right eye exhibits no discharge.  Cardiovascular: Normal rate.   Pulmonary/Chest: Effort normal and breath sounds normal. No respiratory distress.  Abdominal: Soft. She exhibits no distension. There is no tenderness.  Neurological: She is oriented to person, place, and time.  Skin: Skin is warm and dry. She is not diaphoretic.  Psychiatric: She has a normal mood and affect.  Nursing note and vitals reviewed.    ED Treatments / Results  Labs (all labs ordered are listed, but only abnormal results are displayed) Labs Reviewed  BASIC METABOLIC PANEL - Abnormal; Notable for the following:       Result Value   Glucose, Bld 373 (*)    All other components within normal limits  CBG MONITORING, ED - Abnormal; Notable for the  following:    Glucose-Capillary 388 (*)    All other components within normal limits  CBC WITH DIFFERENTIAL/PLATELET    EKG  EKG Interpretation None       Radiology No results found.  Procedures Procedures (including critical care time)  Medications Ordered in ED Medications  0.9 %  sodium chloride infusion ( Intravenous Stopped 01/14/17 1107)     Initial Impression / Assessment and Plan / ED Course  I have reviewed the triage vital signs and the nursing notes.  Pertinent labs & imaging results that were available during my care of the patient were reviewed by me and considered in my medical decision  making (see chart for details).     Very well-appearing 46 year old female with insulin-dependent diabetes presenting with feelings of fatigue and dehydration. We'll give fluids, get labs. Patient's vital signs and physical exam are normal. We'll have her follow-up with her primary care physician as planned.  Labs reassuring, takign PO.   Final Clinical Impressions(s) / ED Diagnoses   Final diagnoses:  None    New Prescriptions New Prescriptions   No medications on file     Macarthur Critchley, MD 01/14/17 1515

## 2017-01-14 NOTE — ED Triage Notes (Signed)
Pt reports she is having a diabetic crisis, states she has not checked her CBG, reports being on insulin (last dose this am), reports she has excessive urination, leg cramps, and extreme thirst. Onset of s/s was 3 days ago.

## 2017-02-02 DIAGNOSIS — E559 Vitamin D deficiency, unspecified: Secondary | ICD-10-CM | POA: Diagnosis not present

## 2017-02-02 DIAGNOSIS — M545 Low back pain: Secondary | ICD-10-CM | POA: Diagnosis not present

## 2017-02-02 DIAGNOSIS — E119 Type 2 diabetes mellitus without complications: Secondary | ICD-10-CM | POA: Diagnosis not present

## 2017-02-02 DIAGNOSIS — Z794 Long term (current) use of insulin: Secondary | ICD-10-CM | POA: Diagnosis not present

## 2017-02-02 DIAGNOSIS — M797 Fibromyalgia: Secondary | ICD-10-CM | POA: Diagnosis not present

## 2017-02-02 DIAGNOSIS — E785 Hyperlipidemia, unspecified: Secondary | ICD-10-CM | POA: Diagnosis not present

## 2017-03-16 DIAGNOSIS — E785 Hyperlipidemia, unspecified: Secondary | ICD-10-CM | POA: Diagnosis not present

## 2017-03-16 DIAGNOSIS — F329 Major depressive disorder, single episode, unspecified: Secondary | ICD-10-CM | POA: Diagnosis not present

## 2017-03-16 DIAGNOSIS — R0602 Shortness of breath: Secondary | ICD-10-CM | POA: Diagnosis not present

## 2017-03-16 DIAGNOSIS — E119 Type 2 diabetes mellitus without complications: Secondary | ICD-10-CM | POA: Diagnosis not present

## 2017-03-20 ENCOUNTER — Other Ambulatory Visit: Payer: Self-pay | Admitting: Obstetrics and Gynecology

## 2017-03-20 DIAGNOSIS — Z139 Encounter for screening, unspecified: Secondary | ICD-10-CM

## 2017-03-23 DIAGNOSIS — J069 Acute upper respiratory infection, unspecified: Secondary | ICD-10-CM | POA: Diagnosis not present

## 2017-03-23 DIAGNOSIS — Z23 Encounter for immunization: Secondary | ICD-10-CM | POA: Diagnosis not present

## 2017-03-23 DIAGNOSIS — F331 Major depressive disorder, recurrent, moderate: Secondary | ICD-10-CM | POA: Diagnosis not present

## 2017-04-05 DIAGNOSIS — E119 Type 2 diabetes mellitus without complications: Secondary | ICD-10-CM | POA: Diagnosis not present

## 2017-04-05 DIAGNOSIS — F329 Major depressive disorder, single episode, unspecified: Secondary | ICD-10-CM | POA: Diagnosis not present

## 2017-04-05 DIAGNOSIS — M545 Low back pain: Secondary | ICD-10-CM | POA: Diagnosis not present

## 2017-04-05 DIAGNOSIS — E785 Hyperlipidemia, unspecified: Secondary | ICD-10-CM | POA: Diagnosis not present

## 2017-04-19 ENCOUNTER — Ambulatory Visit
Admission: RE | Admit: 2017-04-19 | Discharge: 2017-04-19 | Disposition: A | Discharge status: Home / Self Care | Payer: BLUE CROSS/BLUE SHIELD | Source: Ambulatory Visit | Attending: Obstetrics and Gynecology | Admitting: Obstetrics and Gynecology

## 2017-04-19 DIAGNOSIS — Z139 Encounter for screening, unspecified: Secondary | ICD-10-CM

## 2017-04-19 DIAGNOSIS — Z1231 Encounter for screening mammogram for malignant neoplasm of breast: Secondary | ICD-10-CM | POA: Diagnosis not present

## 2017-04-21 DIAGNOSIS — R072 Precordial pain: Secondary | ICD-10-CM | POA: Diagnosis not present

## 2017-05-06 ENCOUNTER — Other Ambulatory Visit: Payer: Self-pay

## 2017-05-06 ENCOUNTER — Encounter (HOSPITAL_BASED_OUTPATIENT_CLINIC_OR_DEPARTMENT_OTHER): Payer: Self-pay | Admitting: Emergency Medicine

## 2017-05-06 ENCOUNTER — Emergency Department (HOSPITAL_BASED_OUTPATIENT_CLINIC_OR_DEPARTMENT_OTHER)
Admission: EM | Admit: 2017-05-06 | Discharge: 2017-05-06 | Disposition: A | Discharge status: Home / Self Care | Payer: BLUE CROSS/BLUE SHIELD | Attending: Emergency Medicine | Admitting: Emergency Medicine

## 2017-05-06 DIAGNOSIS — Y999 Unspecified external cause status: Secondary | ICD-10-CM | POA: Insufficient documentation

## 2017-05-06 DIAGNOSIS — X509XXA Other and unspecified overexertion or strenuous movements or postures, initial encounter: Secondary | ICD-10-CM | POA: Insufficient documentation

## 2017-05-06 DIAGNOSIS — S161XXA Strain of muscle, fascia and tendon at neck level, initial encounter: Secondary | ICD-10-CM | POA: Diagnosis not present

## 2017-05-06 DIAGNOSIS — E119 Type 2 diabetes mellitus without complications: Secondary | ICD-10-CM | POA: Insufficient documentation

## 2017-05-06 DIAGNOSIS — Z794 Long term (current) use of insulin: Secondary | ICD-10-CM | POA: Diagnosis not present

## 2017-05-06 DIAGNOSIS — Y929 Unspecified place or not applicable: Secondary | ICD-10-CM | POA: Insufficient documentation

## 2017-05-06 DIAGNOSIS — Y939 Activity, unspecified: Secondary | ICD-10-CM | POA: Insufficient documentation

## 2017-05-06 DIAGNOSIS — M5417 Radiculopathy, lumbosacral region: Secondary | ICD-10-CM | POA: Diagnosis not present

## 2017-05-06 DIAGNOSIS — M542 Cervicalgia: Secondary | ICD-10-CM | POA: Diagnosis present

## 2017-05-06 DIAGNOSIS — Z79899 Other long term (current) drug therapy: Secondary | ICD-10-CM | POA: Insufficient documentation

## 2017-05-06 LAB — CBG MONITORING, ED: GLUCOSE-CAPILLARY: 117 mg/dL — AB (ref 65–99)

## 2017-05-06 MED ORDER — NAPROXEN 500 MG PO TABS: 500.0000 mg | ORAL_TABLET | Freq: Two times a day (BID) | ORAL | 0 refills | Status: DC

## 2017-05-06 MED ORDER — HYDROCODONE-ACETAMINOPHEN 5-325 MG PO TABS
1.0000 | ORAL_TABLET | Freq: Once | ORAL | Status: AC
  Administered 2017-05-06: 1 via ORAL
  Filled 2017-05-06: qty 1

## 2017-05-06 MED ORDER — HYDROCODONE-ACETAMINOPHEN 5-325 MG PO TABS: 2.0000 | ORAL_TABLET | Freq: Once | ORAL | Status: DC

## 2017-05-06 MED ORDER — NAPROXEN 250 MG PO TABS
500.0000 mg | ORAL_TABLET | Freq: Once | ORAL | Status: AC
  Administered 2017-05-06: 500 mg via ORAL
  Filled 2017-05-06: qty 2

## 2017-05-06 MED ORDER — DEXAMETHASONE SODIUM PHOSPHATE 10 MG/ML IJ SOLN
10.0000 mg | Freq: Once | INTRAMUSCULAR | Status: AC
  Administered 2017-05-06: 10 mg via INTRAMUSCULAR
  Filled 2017-05-06: qty 1

## 2017-05-06 MED ORDER — DIAZEPAM 2 MG PO TABS
2.0000 mg | ORAL_TABLET | Freq: Once | ORAL | Status: AC
  Administered 2017-05-06: 2 mg via ORAL
  Filled 2017-05-06: qty 1

## 2017-05-06 MED ORDER — METHOCARBAMOL 500 MG PO TABS: 500.0000 mg | ORAL_TABLET | Freq: Two times a day (BID) | ORAL | 0 refills | Status: DC

## 2017-05-06 NOTE — ED Triage Notes (Signed)
Pt reports neck pain and L hand numbness after slipping outside and turning the wrong way. Pt states she immediately felt pain. Pt with extensive hx of back pain/surgery and degenerative disc disease.

## 2017-05-06 NOTE — Discharge Instructions (Signed)
Please take the medicine as prescribed.  Consider seeing a spine orthopedic surgeon only if your symptoms are not getting better.

## 2017-05-06 NOTE — ED Provider Notes (Signed)
[place] EMERGENCY DEPARTMENT Provider Note   CSN: [id number] Arrival date & time: 05/06/17  1047     History   Chief Complaint Chief Complaint  Patient presents with  . Neck Pain    HPI Karen Stein is a 47 y.o. female.  HPI  Patient comes in with chief complaint of neck pain, back pain, leg pain. Patient has history of degenerative disc disease of the lumbar spine, status post operative repair in the past.  Patient also has history of diabetes, fibro-myalgias.  Patient reports that yesterday she stepped funny, and started having immediate lower back pain and pain in her right thigh.  Patient is also having some numbness and tingling over the right thigh.  Patient denies any recent episode of similar symptoms.  Patient also reports that intermittently she has been having some neck pain with numbness over her left hand.  At this time she does not have neck pain or the left hand numbness.  There is no history of stroke.  Patient also denies any family history of MS.  Pt has no associated urinary incontinence, urinary retention, bowel incontinence, pins and needle sensation in the perineal area.    Past Medical History:  Diagnosis Date  . Anemia   . Anxiety   . Arthritis   . Chronic pain   . Complication of anesthesia    anesthesia awareness,  . DDD (degenerative disc disease)   . DDD (degenerative disc disease)   . Degenerative disc disease   . Degenerative disc disease   . Diabetes mellitus    Type 2 insulin resistant  . Dysrhythmia    stress and caffeine related  . Endometriosis   . Fibroids   . Fibromyalgia   . GERD (gastroesophageal reflux disease)   . High cholesterol   . Insomnia   . Leg cramps   . PCOS (polycystic ovarian syndrome)   . Scoliosis   . Sickle cell trait [facility])     Patient Active Problem List   Diagnosis Date Noted  . Severe episode of recurrent major depressive disorder, without psychotic features ([place])   . Major  depressive disorder, recurrent severe without psychotic features ([place]) 10/12/2015  . Depression, major, severe recurrence ([place]) 10/12/2015  . Degenerative disc disease   . Sickle cell trait ([place])   . DDD (degenerative disc disease)     Past Surgical History:  Procedure Laterality Date  . BACK SURGERY  2010  . BIOPSY BREAST    . CESAREAN SECTION    . DILATATION & CURETTAGE/HYSTEROSCOPY WITH MYOSURE N/A 01/01/2015   Procedure: DILATATION & CURETTAGE/HYSTEROSCOPY WITH MYOSURE;  Surgeon: Crawford Givens, MD;  Location: [facility];  Service: Gynecology;  Laterality: N/A;  . LAPAROSCOPY  2002 or 2003  . TOE SURGERY  2008  . WISDOM TOOTH EXTRACTION      OB History    Gravida Para Term Preterm AB Living   4 2 1 1 2 1    SAB TAB Ectopic Multiple Live Births   2               Home Medications    Prior to Admission medications   Medication Sig Start Date End Date Taking? Authorizing Provider  glimepiride (AMARYL) 2 MG tablet Take 2 mg by mouth daily with breakfast.   Yes [provider]  phentermine 37.5 MG capsule Take 37.5 mg by mouth every morning.   Yes [provider]  valACYclovir (VALTREX) 500 MG tablet Take 1 tablet (500 mg  total) by mouth daily. 10/18/15  Yes Kerrie Buffalo, NP  DULoxetine (CYMBALTA) 60 MG capsule Take 1 capsule (60 mg total) by mouth daily. 10/18/15   Kerrie Buffalo, NP  gabapentin (NEURONTIN) 100 MG capsule Take 2 capsules (200 mg total) by mouth 3 (three) times daily. Patient not taking: Reported on 06/27/2016 10/18/15   Kerrie Buffalo, NP  ibuprofen (ADVIL,MOTRIN) 800 MG tablet Take 1 tablet (800 mg total) by mouth 3 (three) times daily. 10/31/16   Palumbo, April, MD  Insulin Degludec-Liraglutide (XULTOPHY [place]) Inject 20 Units into the skin daily.    [provider]  Insulin Regular Human (AFREZZA IN) Inhale into the lungs.    [provider]  Insulin Regular Human (NOVOLIN R IJ) Inject 2-4 Units as directed.    [provider]  methocarbamol (ROBAXIN) 500 MG tablet Take 1 tablet (500 mg total) by mouth 2 (two) times daily. 05/06/17   Varney Biles, MD  morphine (MSIR) 15 MG tablet Take 15 mg by mouth every 8 (eight) hours as needed for severe pain.    [provider]  Morphine-Naltrexone (EMBEDA) 30-1.2 MG CPCR Take 1.2 mg by mouth every morning.    [provider]  NABUMETONE PO Take by mouth.    [provider]  naproxen (NAPROSYN) 500 MG tablet Take 1 tablet (500 mg total) by mouth 2 (two) times daily. 05/06/17   Varney Biles, MD  Pregabalin (LYRICA PO) Take 150 mg by mouth 2 (two) times daily.     [provider]  Tapentadol HCl (NUCYNTA PO) Take 75 mg by mouth.    [provider]    Family History Family History  Problem Relation Age of Onset  . Diabetes Father   . Hypertension Father   . Hyperlipidemia Father   . Kidney failure Father   . Diabetes Paternal Grandfather   . Hypertension Paternal Grandfather   . Hyperlipidemia Paternal Grandfather   . Diabetes Paternal Grandmother   . Hypertension Paternal Grandmother   . Hyperlipidemia Paternal Grandmother   . Alzheimer's disease Maternal Grandmother   . Brain cancer Maternal Grandfather     Social History Social History   Tobacco Use  . Smoking status: Never Smoker  . Smokeless tobacco: Never Used  Substance Use Topics  . Alcohol use: No    Alcohol/week: 0.0 oz  . Drug use: No     Allergies   Macrobid [nitrofurantoin macrocrystal]; Shellfish allergy; and Sulfa antibiotics   Review of Systems Review of Systems  Constitutional: Positive for activity change.  Respiratory: Negative for shortness of breath.   Gastrointestinal: Negative for nausea.  Musculoskeletal: Positive for back pain.  Allergic/Immunologic: Positive for immunocompromised state.  Neurological: Positive for numbness. Negative for weakness.     Physical Exam Updated Vital Signs BP 123/74 (BP Location: Right Arm)    Pulse 100   Temp 98.2 F (36.8 C)   Resp 20   Ht 5\' 8"  (1.727 m)   Wt 117.9 kg (260 lb)   LMP 11/13/2015 (Exact Date)   SpO2 99%   BMI 39.53 kg/m   Physical Exam  Constitutional: She is oriented to person, place, and time. She appears well-developed.  HENT:  Head: Normocephalic and atraumatic.  Eyes: EOM are normal.  Neck: Normal range of motion. Neck supple.  No midline c-spine tenderness, pt able to turn head to 45 degrees bilaterally without any pain and able to flex neck to the chest and extend without any pain or neurologic symptoms.  Pt  has 2+ bicepital reflex bilaterally  Cardiovascular: Normal rate.  Pulmonary/Chest: Effort normal.  Abdominal: Bowel sounds are normal.  Musculoskeletal:  Pt has tenderness over the lower lumbar and sacral region diffusely, no point tenderness.  No step offs, no erythema. Pt has weak patellar reflex bilaterally. Able to discriminate between sharp and dull. Able to ambulate     Neurological: She is alert and oriented to person, place, and time. No cranial nerve deficit.  Subjective numbness over the right thigh, with pain on touch  Skin: Skin is warm and dry.  Nursing note and vitals reviewed.    ED Treatments / Results  Labs (all labs ordered are listed, but only abnormal results are displayed) Labs Reviewed  CBG MONITORING, ED - Abnormal; Notable for the following components:      Result Value   Glucose-Capillary 117 (*)    All other components within normal limits    EKG  EKG Interpretation None       Radiology No results found.  Procedures Procedures (including critical care time)  Medications Ordered in ED Medications  naproxen (NAPROSYN) tablet 500 mg (500 mg Oral Given 05/06/17 1307)  dexamethasone (DECADRON) injection 10 mg (10 mg Intramuscular Given 05/06/17 1307)  diazepam (VALIUM) tablet 2 mg (2 mg Oral Given 05/06/17 1307)  HYDROcodone-acetaminophen (NORCO/VICODIN) 5-325 MG per tablet 1 tablet (1 tablet  Oral Given 05/06/17 1306)     Initial Impression / Assessment and Plan / ED Course  I have reviewed the triage vital signs and the nursing notes.  Pertinent labs & imaging results that were available during my care of the patient were reviewed by me and considered in my medical decision making (see chart for details).    Patient comes in with chief complaint of back pain, neck pain, left hand numbness, right leg pain and numbness. Patient has known history of DDD of the lower spine.  Patient is noted to have diffuse lower spine pain, C-spine pain, with subjective numbness over the right thigh.  Patient has positive passive leg raise on the right side.  Patient has normal reflexes for the upper extremity, diminished reflexes for the lower extremity.  Differential diagnosis includes worsening DDD of the lower spine, sciatica, MS, stroke, nerve impingement, herniated disc.  Given that the patient's symptoms started after she stepped funny, and she has a specific precipitating factor that initiated the pain, we suspect that the pain is likely mechanical in nature.  No red flags to suggest cord compression and therefore no need for emergent MRI.  Patient will be started on anti-inflammatory and muscle relaxants.  She has been advised to see her own doctor in 1 week, and if the symptoms do not get better she has been provided with orthopedic spine follow-up.  Final Clinical Impressions(s) / ED Diagnoses   Final diagnoses:  Lumbosacral radiculopathy  Strain of neck muscle, initial encounter    ED Discharge Orders        Ordered    naproxen (NAPROSYN) 500 MG tablet  2 times daily     05/06/17 1415    methocarbamol (ROBAXIN) 500 MG tablet  2 times daily     05/06/17 1415       Varney Biles, MD 05/06/17 1552

## 2017-05-10 DIAGNOSIS — Z794 Long term (current) use of insulin: Secondary | ICD-10-CM | POA: Diagnosis not present

## 2017-05-10 DIAGNOSIS — E119 Type 2 diabetes mellitus without complications: Secondary | ICD-10-CM | POA: Diagnosis not present

## 2017-05-10 DIAGNOSIS — E785 Hyperlipidemia, unspecified: Secondary | ICD-10-CM | POA: Diagnosis not present

## 2017-05-10 DIAGNOSIS — M545 Low back pain: Secondary | ICD-10-CM | POA: Diagnosis not present

## 2017-05-10 DIAGNOSIS — F329 Major depressive disorder, single episode, unspecified: Secondary | ICD-10-CM | POA: Diagnosis not present

## 2017-05-14 DIAGNOSIS — E785 Hyperlipidemia, unspecified: Secondary | ICD-10-CM | POA: Diagnosis not present

## 2017-05-14 DIAGNOSIS — E119 Type 2 diabetes mellitus without complications: Secondary | ICD-10-CM | POA: Diagnosis not present

## 2017-07-19 ENCOUNTER — Emergency Department (HOSPITAL_BASED_OUTPATIENT_CLINIC_OR_DEPARTMENT_OTHER)
Admission: EM | Admit: 2017-07-19 | Discharge: 2017-07-19 | Disposition: A | Discharge status: Home / Self Care | Payer: BLUE CROSS/BLUE SHIELD | Attending: Emergency Medicine | Admitting: Emergency Medicine

## 2017-07-19 ENCOUNTER — Encounter (HOSPITAL_BASED_OUTPATIENT_CLINIC_OR_DEPARTMENT_OTHER): Payer: Self-pay

## 2017-07-19 DIAGNOSIS — E876 Hypokalemia: Secondary | ICD-10-CM | POA: Insufficient documentation

## 2017-07-19 DIAGNOSIS — R42 Dizziness and giddiness: Secondary | ICD-10-CM | POA: Diagnosis present

## 2017-07-19 DIAGNOSIS — E1165 Type 2 diabetes mellitus with hyperglycemia: Secondary | ICD-10-CM | POA: Insufficient documentation

## 2017-07-19 DIAGNOSIS — Z79899 Other long term (current) drug therapy: Secondary | ICD-10-CM | POA: Diagnosis not present

## 2017-07-19 DIAGNOSIS — F410 Panic disorder [episodic paroxysmal anxiety] without agoraphobia: Secondary | ICD-10-CM | POA: Diagnosis not present

## 2017-07-19 LAB — CBC WITH DIFFERENTIAL/PLATELET
Basophils Absolute: 0.1 10*3/uL (ref 0.0–0.1)
Basophils Relative: 1 %
Eosinophils Absolute: 0 10*3/uL (ref 0.0–0.7)
Eosinophils Relative: 0 %
HEMATOCRIT: 41.5 % (ref 36.0–46.0)
HEMOGLOBIN: 14.1 g/dL (ref 12.0–15.0)
LYMPHS ABS: 2 10*3/uL (ref 0.7–4.0)
Lymphocytes Relative: 31 %
MCH: 28.7 pg (ref 26.0–34.0)
MCHC: 34 g/dL (ref 30.0–36.0)
MCV: 84.3 fL (ref 78.0–100.0)
MONOS PCT: 4 %
Monocytes Absolute: 0.3 10*3/uL (ref 0.1–1.0)
NEUTROS ABS: 4 10*3/uL (ref 1.7–7.7)
NEUTROS PCT: 64 %
Platelets: 319 10*3/uL (ref 150–400)
RBC: 4.92 MIL/uL (ref 3.87–5.11)
RDW: 13.5 % (ref 11.5–15.5)
WBC: 6.4 10*3/uL (ref 4.0–10.5)

## 2017-07-19 LAB — COMPREHENSIVE METABOLIC PANEL
ALT: 11 U/L — AB (ref 14–54)
ANION GAP: 12 (ref 5–15)
AST: 25 U/L (ref 15–41)
Albumin: 3.9 g/dL (ref 3.5–5.0)
Alkaline Phosphatase: 88 U/L (ref 38–126)
BUN: 9 mg/dL (ref 6–20)
CHLORIDE: 104 mmol/L (ref 101–111)
CO2: 22 mmol/L (ref 22–32)
Calcium: 9 mg/dL (ref 8.9–10.3)
Creatinine, Ser: 1.04 mg/dL — ABNORMAL HIGH (ref 0.44–1.00)
GFR calc non Af Amer: >60 mL/min (ref >60)
Glucose, Bld: 210 mg/dL — ABNORMAL HIGH (ref 65–99)
Potassium: 3.3 mmol/L — ABNORMAL LOW (ref 3.5–5.1)
SODIUM: 138 mmol/L (ref 135–145)
Total Bilirubin: 1.3 mg/dL — ABNORMAL HIGH (ref 0.3–1.2)
Total Protein: 8.4 g/dL — ABNORMAL HIGH (ref 6.5–8.1)

## 2017-07-19 MED ORDER — POTASSIUM CHLORIDE CRYS ER 20 MEQ PO TBCR
40.0000 meq | EXTENDED_RELEASE_TABLET | Freq: Once | ORAL | Status: AC
  Administered 2017-07-19: 40 meq via ORAL
  Filled 2017-07-19: qty 2

## 2017-07-19 MED ORDER — LORAZEPAM 2 MG/ML IJ SOLN
0.5000 mg | Freq: Once | INTRAMUSCULAR | Status: AC
  Administered 2017-07-19: 0.5 mg via INTRAVENOUS
  Filled 2017-07-19: qty 1

## 2017-07-19 NOTE — ED Notes (Signed)
ED Provider at bedside. 

## 2017-07-19 NOTE — ED Notes (Signed)
Pt reports she would like a referral to a psychiatrist; denies SI/HI, just feels like she has a lot going on right now and "could break down at any moment". Pt depressed and sad; has not been talking with her husband at any time I've been in the room.

## 2017-07-19 NOTE — ED Provider Notes (Signed)
[place] EMERGENCY DEPARTMENT Provider Note   CSN: [id number] Arrival date & time: 07/19/17  0932     History   Chief Complaint Chief Complaint  Patient presents with  . Panic Attack    HPI Karen Stein is a 47 y.o. female.  HPI she reports that 8 AM today she began to feel lightheaded and nauseous..  She thought her sugar might be low, she tried to check her sugar but the glucometer had a dead battery.  She denies any chest pain.  She does admit to shortness of breath which has since improved.  She was brought by EMS.  No treatment prior to coming here and she began to feel improved in the presence of EMS, without treatment.  EMS obtained CBG which was 222  Past Medical History:  Diagnosis Date  . Anemia   . Anxiety   . Arthritis   . Chronic pain   . Complication of anesthesia    anesthesia awareness,  . DDD (degenerative disc disease)   . DDD (degenerative disc disease)   . Degenerative disc disease   . Degenerative disc disease   . Diabetes mellitus    Type 2 insulin resistant  . Dysrhythmia    stress and caffeine related  . Endometriosis   . Fibroids   . Fibromyalgia   . GERD (gastroesophageal reflux disease)   . High cholesterol   . Insomnia   . Leg cramps   . PCOS (polycystic ovarian syndrome)   . Scoliosis   . Sickle cell trait ([facility])    Bipolar disorder, Patient Active Problem List   Diagnosis Date Noted  . Severe episode of recurrent major depressive disorder, without psychotic features ([place])   . Major depressive disorder, recurrent severe without psychotic features ([place]) 10/12/2015  . Depression, major, severe recurrence ([place]) 10/12/2015  . Degenerative disc disease   . Sickle cell trait ([place])   . DDD (degenerative disc disease)     Past Surgical History:  Procedure Laterality Date  . BACK SURGERY  2010  . BIOPSY BREAST    . CESAREAN SECTION    . DILATATION & CURETTAGE/HYSTEROSCOPY WITH MYOSURE N/A 01/01/2015   Procedure:  DILATATION & CURETTAGE/HYSTEROSCOPY WITH MYOSURE;  Surgeon: Crawford Givens, MD;  Location: [facility];  Service: Gynecology;  Laterality: N/A;  . LAPAROSCOPY  2002 or 2003  . TOE SURGERY  2008  . WISDOM TOOTH EXTRACTION      OB History    Gravida  4   Para  2   Term  1   Preterm  1   AB  2   Living  1     SAB  2   TAB      Ectopic      Multiple      Live Births               Home Medications    Prior to Admission medications   Medication Sig Start Date End Date Taking? Authorizing Provider  DULoxetine (CYMBALTA) 60 MG capsule Take 1 capsule (60 mg total) by mouth daily. 10/18/15   Kerrie Buffalo, NP  gabapentin (NEURONTIN) 100 MG capsule Take 2 capsules (200 mg total) by mouth 3 (three) times daily. Patient not taking: Reported on 06/27/2016 10/18/15   Kerrie Buffalo, NP  glimepiride (AMARYL) 2 MG tablet Take 2 mg by mouth daily with breakfast.    [provider]  ibuprofen (ADVIL,MOTRIN) 800 MG tablet Take 1 tablet (800 mg total) by mouth  3 (three) times daily. 10/31/16   Palumbo, April, MD  Insulin Degludec-Liraglutide (XULTOPHY [place]) Inject 20 Units into the skin daily.    [provider]  Insulin Regular Human (AFREZZA IN) Inhale into the lungs.    [provider]  Insulin Regular Human (NOVOLIN R IJ) Inject 2-4 Units as directed.    [provider]  methocarbamol (ROBAXIN) 500 MG tablet Take 1 tablet (500 mg total) by mouth 2 (two) times daily. 05/06/17   Varney Biles, MD  morphine (MSIR) 15 MG tablet Take 15 mg by mouth every 8 (eight) hours as needed for severe pain.    [provider]  Morphine-Naltrexone (EMBEDA) 30-1.2 MG CPCR Take 1.2 mg by mouth every morning.    [provider]  NABUMETONE PO Take by mouth.    [provider]  naproxen (NAPROSYN) 500 MG tablet Take 1 tablet (500 mg total) by mouth 2 (two) times daily. 05/06/17   Varney Biles, MD  phentermine 37.5 MG capsule Take 37.5 mg by mouth  every morning.    [provider]  Pregabalin (LYRICA PO) Take 150 mg by mouth 2 (two) times daily.     [provider]  Tapentadol HCl (NUCYNTA PO) Take 75 mg by mouth.    [provider]  valACYclovir (VALTREX) 500 MG tablet Take 1 tablet (500 mg total) by mouth daily. 10/18/15   Kerrie Buffalo, NP    Family History Family History  Problem Relation Age of Onset  . Diabetes Father   . Hypertension Father   . Hyperlipidemia Father   . Kidney failure Father   . Diabetes Paternal Grandfather   . Hypertension Paternal Grandfather   . Hyperlipidemia Paternal Grandfather   . Diabetes Paternal Grandmother   . Hypertension Paternal Grandmother   . Hyperlipidemia Paternal Grandmother   . Alzheimer's disease Maternal Grandmother   . Brain cancer Maternal Grandfather     Social History Social History   Tobacco Use  . Smoking status: Never Smoker  . Smokeless tobacco: Never Used  Substance Use Topics  . Alcohol use: No    Alcohol/week: 0.0 oz  . Drug use: No     Allergies   Macrobid [nitrofurantoin macrocrystal]; Shellfish allergy; and Sulfa antibiotics   Review of Systems Review of Systems  Constitutional: Negative.   HENT: Negative.   Respiratory: Positive for shortness of breath.   Cardiovascular: Negative.   Gastrointestinal: Positive for nausea.  Genitourinary:       Post menopausal  Musculoskeletal: Negative.   Skin: Negative.   Allergic/Immunologic: Positive for immunocompromised state.       Diabetic  Neurological: Positive for weakness.  Psychiatric/Behavioral: Negative.   All other systems reviewed and are negative.    Physical Exam Updated Vital Signs BP 108/64 (BP Location: Right Arm)   Pulse 68   Temp 98.1 F (36.7 C) (Oral)   Resp (!) 38   Ht 5\' 8"  (1.727 m)   Wt 115.2 kg (254 lb)   LMP 11/13/2015 (Exact Date)   SpO2 100%   BMI 38.62 kg/m   Physical Exam  Constitutional: She is oriented to person, place, and time.  She appears well-developed and well-nourished.  Anxious appearing  HENT:  Head: Normocephalic and atraumatic.  Eyes: Pupils are equal, round, and reactive to light. Conjunctivae are normal.  Neck: Neck supple. No tracheal deviation present. No thyromegaly present.  Cardiovascular: Normal rate and regular rhythm.  No murmur heard. Pulmonary/Chest: Effort normal and breath sounds normal.  Respiratory  rate counted 20 breaths/min by me  Abdominal: Soft. Bowel sounds are normal. She exhibits no distension. There is no tenderness.  Obese  Musculoskeletal: Normal range of motion. She exhibits no edema or tenderness.  Neurological: She is alert and oriented to person, place, and time. She exhibits normal muscle tone. Coordination normal.  Gait normal  Skin: Skin is warm and dry. No rash noted.  Psychiatric:  Anxious  Nursing note and vitals reviewed.    ED Treatments / Results  Labs (all labs ordered are listed, but only abnormal results are displayed) Labs Reviewed  COMPREHENSIVE METABOLIC PANEL  CBC WITH DIFFERENTIAL/PLATELET    EKG Interpretation  Date/Time:  Thursday July 19 2017 10:19:50 EDT Ventricular Rate:  74 PR Interval:    QRS Duration: 84 QT Interval:  393 QTC Calculation: 436 R Axis:   87 Text Interpretation:  Sinus rhythm Multiple ventricular premature complexes Borderline T wave abnormalities Since last tracing rate slower Confirmed by Orlie Dakin [phone]) on 07/19/2017 10:23:24 AM       Results for orders placed or performed during the hospital encounter of 07/19/17  Comprehensive metabolic panel  Result Value Ref Range   Sodium 138 135 - 145 mmol/L   Potassium 3.3 (L) 3.5 - 5.1 mmol/L   Chloride 104 101 - 111 mmol/L   CO2 22 22 - 32 mmol/L   Glucose, Bld 210 (H) 65 - 99 mg/dL   BUN 9 6 - 20 mg/dL   Creatinine, Ser 1.04 (H) 0.44 - 1.00 mg/dL   Calcium 9.0 8.9 - 10.3 mg/dL   Total Protein 8.4 (H) 6.5 - 8.1 g/dL   Albumin 3.9 3.5 - 5.0 g/dL   AST 25 15  - 41 U/L   ALT 11 (L) 14 - 54 U/L   Alkaline Phosphatase 88 38 - 126 U/L   Total Bilirubin 1.3 (H) 0.3 - 1.2 mg/dL   GFR calc non Af Amer >60 >60 mL/min   GFR calc Af Amer >60 >60 mL/min   Anion gap 12 5 - 15  CBC with Differential/Platelet  Result Value Ref Range   WBC 6.4 4.0 - 10.5 K/uL   RBC 4.92 3.87 - 5.11 MIL/uL   Hemoglobin 14.1 12.0 - 15.0 g/dL   HCT 41.5 36.0 - 46.0 %   MCV 84.3 78.0 - 100.0 fL   MCH 28.7 26.0 - 34.0 pg   MCHC 34.0 30.0 - 36.0 g/dL   RDW 13.5 11.5 - 15.5 %   Platelets 319 150 - 400 K/uL   Neutrophils Relative % 64 %   Neutro Abs 4.0 1.7 - 7.7 K/uL   Lymphocytes Relative 31 %   Lymphs Abs 2.0 0.7 - 4.0 K/uL   Monocytes Relative 4 %   Monocytes Absolute 0.3 0.1 - 1.0 K/uL   Eosinophils Relative 0 %   Eosinophils Absolute 0.0 0.0 - 0.7 K/uL   Basophils Relative 1 %   Basophils Absolute 0.1 0.0 - 0.1 K/uL   No results found.  EKG  EKG Interpretation None      ED ECG REPORT   Date: 07/19/2017  Rate: 75  Rhythm: normal sinus rhythm  QRS Axis: normal  Intervals: normal  ST/T Wave abnormalities: nonspecific T wave changes  Conduction Disutrbances:none  Narrative Interpretation:   Old EKG Reviewed: Rate lower than previous tracing  I have personally reviewed the EKG tracing and agree with the computerized printout as noted. Radiology No results found.  Procedures Procedures (including critical care time)  Medications Ordered in  ED Medications  LORazepam (ATIVAN) injection 0.5 mg (has no administration in time range)     Initial Impression / Assessment and Plan / ED Course  I have reviewed the triage vital signs and the nursing notes.  Pertinent labs & imaging results that were available during my care of the patient were reviewed by me and considered in my medical decision making (see chart for details).     12:25 PM patient is alert ambulatory difficulty feels much improved and feels ready go home after treatment with intravenous  lorazepam.  She she now feels as if she suffered a panic attack.  She is requesting psychiatric follow-up.  She does not have any thought of harming self or others. She received oral potassium supplementation while here lab work consistent with mild hyper glycemia which does not require further treatment and kalemia. She will be furnished with resource guide to get psychiatric help Final Clinical Impressions(s) / ED Diagnoses  Diagnoses #1 panic attack Final diagnoses:  None   #2 hypokalemia #3 hyperglycemia ED Discharge Orders    None       Orlie Dakin, MD 07/19/17 [phone]

## 2017-07-19 NOTE — ED Notes (Signed)
Pt given ice water and peanut butter and crackers, per pt request. MD okay'd.

## 2017-07-19 NOTE — Discharge Instructions (Signed)
Call any of the numbers listed to get psychiatric help.  You can also ask your primary care physician for referral to a psychiatrist., If you have any thought of harming yourself or someone else call 911 immediately.

## 2017-07-19 NOTE — ED Triage Notes (Signed)
Pt arrives via EMS; states pt was at work when she had sudden onset of dizziness, chest pain, and tachypnea. Hx of anxiety and panic attacks, states she has not had one in a while. Pt reports she has had a lot of stress in her life lately, between home and work. Husband at bedside.

## 2017-07-19 NOTE — ED Notes (Signed)
Family at bedside. 

## 2017-08-04 ENCOUNTER — Ambulatory Visit (INDEPENDENT_AMBULATORY_CARE_PROVIDER_SITE_OTHER): Payer: BLUE CROSS/BLUE SHIELD | Admitting: Psychiatry

## 2017-08-04 ENCOUNTER — Encounter (HOSPITAL_COMMUNITY): Payer: Self-pay | Admitting: Psychiatry

## 2017-08-04 ENCOUNTER — Other Ambulatory Visit (HOSPITAL_COMMUNITY): Payer: Self-pay

## 2017-08-04 VITALS — BP 130/80 | HR 87 | Ht 68.0 in | Wt 240.0 lb

## 2017-08-04 DIAGNOSIS — F332 Major depressive disorder, recurrent severe without psychotic features: Secondary | ICD-10-CM

## 2017-08-04 DIAGNOSIS — F411 Generalized anxiety disorder: Secondary | ICD-10-CM

## 2017-08-04 DIAGNOSIS — R450 Nervousness: Secondary | ICD-10-CM | POA: Diagnosis not present

## 2017-08-04 DIAGNOSIS — M797 Fibromyalgia: Secondary | ICD-10-CM

## 2017-08-04 DIAGNOSIS — Z810 Family history of intellectual disabilities: Secondary | ICD-10-CM

## 2017-08-04 DIAGNOSIS — G4700 Insomnia, unspecified: Secondary | ICD-10-CM | POA: Diagnosis not present

## 2017-08-04 DIAGNOSIS — F0630 Mood disorder due to known physiological condition, unspecified: Secondary | ICD-10-CM

## 2017-08-04 MED ORDER — BREXPIPRAZOLE 0.5 MG PO TABS: 0.5000 mg | ORAL_TABLET | Freq: Two times a day (BID) | ORAL | 1 refills | Status: DC

## 2017-08-04 MED ORDER — DULOXETINE HCL 30 MG PO CPEP: 30.0000 mg | ORAL_CAPSULE | Freq: Every day | ORAL | 1 refills | Status: DC

## 2017-08-04 NOTE — Progress Notes (Signed)
Psychiatric Initial Adult Assessment   Patient Identification: Karen Stein MRN:  [id number] Date of Evaluation:  08/04/2017 Referral Source: Primary care and herself. Has seen [facility] in [place] but says its too far Chief Complaint:   Chief Complaint    Establish Care; Depression     Visit Diagnosis:    ICD-10-CM   1. Major depressive disorder, recurrent severe without psychotic features ([place]) F33.2   2. Mood disorder in conditions classified elsewhere F06.30   3. GAD (generalized anxiety disorder) F41.1   4. Fibromyalgia M79.7     History of Present Illness:  Presents for depression . Patient states that she has been following with a psychiatrist in [place] what it was too far she has been diagnosed with possible bipolar disorder major depressive disorder and anxiety including fibromyalgia  She has been taking rexulti upto  3 mg but says that she was having tremors so she stopped it she has not been on any medication for the last 2 months that is not rexulti or cymbalta  States she is feeling somewhat better on the consulting her mood symptoms were somewhat better in control but she is now feeling down and hopeless depressed crying spells decreased motivation and decreased energy and not focus having difficulty and performance of her work she is at the brink of being discharged from her job she is also having financial difficulties and may apply for bankruptcy.  She worries a lot she worries and mind is racing at night there is some questionable history of extra spending or having euphoric one or 2 days but not to the point of having psychotic symptoms says that she does spend money when she has the money. She is possibly diagnosed with bipolar illness there is no clear manic full blown episode.  Says she has been admitted in the hospital 2 times last was in 2017 because she mentions some comment about hopelessness says that she does not want to die and she did not  attempt to suicide she was suffering from depression and also because of fibromyalgia decreased energy and she says that it affects her memory and energy level and she gets into despair. Currently she is on Lyrica 150 mg takes at night does not take it twice a day because it makes her sleepy. She is also getting a sleep study done because she does snore she has tiredness the morning and we talked about making sure that she does get a sleep study done as it may be contributing to only one energy as well   Aggravating F; fibromyalgia, finances Modifying F: family, kid,   Having job problems and cant function due to depression and decreased energy or multi task   Severity of depression; 3/10. 10 being no depression  Associated Signs/Symptoms: Depression Symptoms:  depressed mood, anhedonia, insomnia, psychomotor retardation, fatigue, difficulty concentrating, anxiety, loss of energy/fatigue, disturbed sleep, (Hypo) Manic Symptoms:  Distractibility, Labiality of Mood, Anxiety Symptoms:  Excessive Worry, Psychotic Symptoms:  denies PTSD Symptoms: NA  Past Psychiatric History: depression recurrent   Previous Psychotropic Medications: Yes   Substance Abuse History in the last 12 months:  No.  Consequences of Substance Abuse: NA  Past Medical History:  Past Medical History:  Diagnosis Date  . Anemia   . Anxiety   . Arthritis   . Chronic pain   . Complication of anesthesia    anesthesia awareness,  . DDD (degenerative disc disease)   . DDD (degenerative disc disease)   . Degenerative  disc disease   . Degenerative disc disease   . Diabetes mellitus    Type 2 insulin resistant  . Dysrhythmia    stress and caffeine related  . Endometriosis   . Fibroids   . Fibromyalgia   . GERD (gastroesophageal reflux disease)   . High cholesterol   . Insomnia   . Leg cramps   . PCOS (polycystic ovarian syndrome)   . Scoliosis   . Sickle cell trait [facility])     Past Surgical History:   Procedure Laterality Date  . BACK SURGERY  2010  . BIOPSY BREAST    . CESAREAN SECTION    . DILATATION & CURETTAGE/HYSTEROSCOPY WITH MYOSURE N/A 01/01/2015   Procedure: DILATATION & CURETTAGE/HYSTEROSCOPY WITH MYOSURE;  Surgeon: Crawford Givens, MD;  Location: [facility];  Service: Gynecology;  Laterality: N/A;  . LAPAROSCOPY  2002 or 2003  . TOE SURGERY  2008  . WISDOM TOOTH EXTRACTION      Family Psychiatric History: Aunt : schizophrenia  Family History:  Family History  Problem Relation Age of Onset  . Diabetes Father   . Hypertension Father   . Hyperlipidemia Father   . Kidney failure Father   . Diabetes Paternal Grandfather   . Hypertension Paternal Grandfather   . Hyperlipidemia Paternal Grandfather   . Diabetes Paternal Grandmother   . Hypertension Paternal Grandmother   . Hyperlipidemia Paternal Grandmother   . Alzheimer's disease Maternal Grandmother   . Brain cancer Maternal Grandfather     Social History:   Social History   Socioeconomic History  . Marital status: Married    Spouse name: Not on file  . Number of children: Not on file  . Years of education: Not on file  . Highest education level: Not on file  Occupational History  . Not on file  Social Needs  . Financial resource strain: Not on file  . Food insecurity:    Worry: Not on file    Inability: Not on file  . Transportation needs:    Medical: Not on file    Non-medical: Not on file  Tobacco Use  . Smoking status: Never Smoker  . Smokeless tobacco: Never Used  Substance and Sexual Activity  . Alcohol use: No    Alcohol/week: 0.0 oz  . Drug use: No  . Sexual activity: Not on file  Lifestyle  . Physical activity:    Days per week: Not on file    Minutes per session: Not on file  . Stress: Not on file  Relationships  . Social connections:    Talks on phone: Not on file    Gets together: Not on file    Attends religious service: Not on file    Active member of club or organization: Not on  file    Attends meetings of clubs or organizations: Not on file    Relationship status: Not on file  Other Topics Concern  . Not on file  Social History Narrative  . Not on file    Additional Social History: Grew up with parents then step parents as they divorced, not sexual trauma. Was bullied in school Got involved with wrong crowd in the beginning Now working with [employer] Married 8 years  Allergies:   Allergies  Allergen Reactions  . Macrobid [Nitrofurantoin Macrocrystal] Nausea Only    Headache and "severe abd cramps"  . Shellfish Allergy Swelling    Itching of lips and ears, NOT allergic to iodine contrast  . Sulfa Antibiotics Other (See  Comments)    Dizzy and sees spots with sulfa drugs    Metabolic Disorder Labs: Lab Results  Component Value Date   HGBA1C (H) 04/14/2010    10.9 (NOTE)                                                                       According to the ADA Clinical Practice Recommendations for 2011, when HbA1c is used as a screening test:   >=6.5%   Diagnostic of Diabetes Mellitus           (if abnormal result  is confirmed)  5.7-6.4%   Increased risk of developing Diabetes Mellitus  References:Diagnosis and Classification of Diabetes Mellitus,Diabetes HGDJ,2426,83(MHDQQ 1):S62-S69 and Standards of Medical Care in         Diabetes - 2011,Diabetes Care,2011,34  (Suppl 1):S11-S61.   MPG 266 (H) 04/14/2010   Lab Results  Component Value Date   PROLACTIN 6.7 07/22/2012   Lab Results  Component Value Date   CHOL (H) 04/14/2010    209        ATP III CLASSIFICATION:  <200     mg/dL   Desirable  200-239  mg/dL   Borderline High  >=240    mg/dL   High          TRIG 123 04/14/2010   HDL 35 (L) 04/14/2010   CHOLHDL 6.0 04/14/2010   VLDL 25 04/14/2010   LDLCALC (H) 04/14/2010    149        Total Cholesterol/HDL:CHD Risk Coronary Heart Disease Risk Table                     Men   Women  1/2 Average Risk   3.4   3.3  Average Risk       5.0   4.4  2 X  Average Risk   9.6   7.1  3 X Average Risk  23.4   11.0        Use the calculated Patient Ratio above and the CHD Risk Table to determine the patient's CHD Risk.        ATP III CLASSIFICATION (LDL):  <100     mg/dL   Optimal  100-129  mg/dL   Near or Above                    Optimal  130-159  mg/dL   Borderline  160-189  mg/dL   High  >190     mg/dL   Very High     Current Medications: Current Outpatient Medications  Medication Sig Dispense Refill  . Insulin Degludec (TRESIBA) 100 UNIT/ML SOLN Inject into the skin.    Marland Kitchen liraglutide (VICTOZA) 18 MG/3ML SOPN Inject into the skin.    . pioglitazone-glimepiride (DUETACT) 30-4 MG tablet TK 1 T PO QD  3  . Brexpiprazole (REXULTI) 0.5 MG TABS Take 1 tablet (0.5 mg total) by mouth 2 (two) times daily. 60 tablet 1  . DULoxetine (CYMBALTA) 30 MG capsule Take 1 capsule (30 mg total) by mouth daily. 30 capsule 1  . glimepiride (AMARYL) 2 MG tablet Take 2 mg by mouth daily with breakfast.    . ibuprofen (ADVIL,MOTRIN) 800 MG tablet Take 1  tablet (800 mg total) by mouth 3 (three) times daily. (Patient not taking: Reported on 08/04/2017) 21 tablet 0  . Insulin Degludec-Liraglutide (XULTOPHY [place]) Inject 20 Units into the skin daily.    . Insulin Regular Human (AFREZZA IN) Inhale into the lungs.    . Insulin Regular Human (NOVOLIN R IJ) Inject 2-4 Units as directed.    . methocarbamol (ROBAXIN) 500 MG tablet Take 1 tablet (500 mg total) by mouth 2 (two) times daily. (Patient not taking: Reported on 08/04/2017) 20 tablet 0  . Morphine-Naltrexone (EMBEDA) 30-1.2 MG CPCR Take 1.2 mg by mouth every morning.    Marland Kitchen NABUMETONE PO Take by mouth.    . phentermine 37.5 MG capsule Take 37.5 mg by mouth every morning.    . Pregabalin (LYRICA PO) Take 150 mg by mouth 2 (two) times daily.     . Tapentadol HCl (NUCYNTA PO) Take 75 mg by mouth.    . valACYclovir (VALTREX) 500 MG tablet Take 1 tablet (500 mg total) by mouth daily. (Patient not taking: Reported on  08/04/2017) 30 tablet 11   No current facility-administered medications for this visit.     Neurologic: Headache: No Seizure: No Paresthesias:No  Musculoskeletal: Strength & Muscle Tone: within normal limits Gait & Station: normal Patient leans: no lean  Psychiatric Specialty Exam: Review of Systems  Cardiovascular: Negative for chest pain.  Musculoskeletal: Positive for myalgias.  Psychiatric/Behavioral: Positive for depression. Negative for suicidal ideas. The patient is nervous/anxious and has insomnia.     Blood pressure 130/80, pulse 87, height 5\' 8"  (1.727 m), weight 240 lb (108.9 kg), last menstrual period 11/13/2015.Body mass index is 36.49 kg/m.  General Appearance: Casual  Eye Contact:  Fair  Speech:  Slow  Volume:  Decreased  Mood:  Dysphoric  Affect:  Constricted  Thought Process:  Goal Directed  Orientation:  Full (Time, Place, and Person)  Thought Content:  Rumination  Suicidal Thoughts:  No  Homicidal Thoughts:  No  Memory:  Immediate;   Fair Recent;   Fair  Judgement:  Fair  Insight:  Shallow  Psychomotor Activity:  Decreased  Concentration:  Concentration: Fair and Attention Span: Fair  Recall:  [employer] of Knowledge:Fair  Language: Fair  Akathisia:  No  Handed:  Right  AIMS (if indicated):  0  Assets:  Desire for Improvement  ADL's:  Intact  Cognition: WNL  Sleep:  Variable to poor    Treatment Plan Summary: Medication management and Plan as follows  1. Mood disorder unspecified; questionable bipolar, depressed;  Fibromyalgia mayb e contributing to depression as well rexulti has helped will start low dose for now 0.5mg  bid. Had tremors in high dose  2. Major depression severe; rexulti has above, add cymbalta 30mg   3. GAD: cymbalta as above Will need to increase by next visit or call back in 5 days if tolerates ok to increase  4. Fibromyalgia: on lyrica but not taking full dose due to sedation Prolactin levels reviewed, fu closely for  diabetes and medical concers with primary care Fu with primary care and pain management 5. Insomnia: has sleep study pending. That will help determine if OSA contributing to tiredness Have ME time, Worry Time, Family time and refer to therapy  Call back otherwise fu in 2 w or call 911 if suicidal   More than 50% time spent in counseling and coordination of care including visions of his various side effects and concerns were addressed   Merian Capron, MD 4/6/20198:37 AM

## 2017-08-08 ENCOUNTER — Telehealth (HOSPITAL_COMMUNITY): Payer: Self-pay

## 2017-08-08 NOTE — Telephone Encounter (Signed)
Please call patient let know of other options first including abilify or invega

## 2017-08-08 NOTE — Telephone Encounter (Signed)
Patient returned my phone call and stated that she does not want to add another medication to the Cymbalta she is taking. She stated Cymbalta is making her tremor but she is willing to stay on it for now. Nothing further is needed at this time.

## 2017-08-08 NOTE — Telephone Encounter (Signed)
Insurance company denied prior authorization for [place] since patient has not tried and fail the preferred medications which are; aripiprazole, clozapine, olanzapine, quetiapine, paliperidone, risperidone or ziprasidone. Please review and advise.

## 2017-08-08 NOTE — Telephone Encounter (Signed)
Called patient and left a VM informing her of the insurance not covering [place]. I informed her to call us and let us know if she is willing to try the Abilify or the Invega.

## 2017-08-23 NOTE — Progress Notes (Signed)
[place] MD/PA/NP OP Progress Note  08/25/2017 12:16 PM Karen Stein  MRN:  [id number]  Chief Complaint:  Chief Complaint    Follow-up; Other; Depression     HPI:  Karen Stein is a 47 y.o. year old female with a history of unspecified mood disorder, depression, hypercholesterolemia, Asthma, Fibromyalgia, DDD, who presents for follow up appointment for Mood disorder [facility]) She is a patient of Dr. De Nurse.   She states that she has not started rexulti as her insurance declines it.  She has not taken duloxetine for the past few days, stating that it causes her some shaking in her lips.  She denies tremors in her hands.  She is concerned of switching around medication due to her experience of having adverse reaction (mostly shaking in her lips) from antidepressant. She continues to feel depressed, and feels exhausted. She states that she feels embarrassed that she has been unable to take a shower before going to work due to significant fatigue. She missed work two days last week due to depression. She received a final notice in regards to the work and is concerned that she may be fired as the company merges as she took off days from work. 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 She states that it is "repeated cycle," referring that she is unable to work well due to decreased concentration, which affects her employment status. She has filed for disability. She has SI without plans. She denies plans or intent, stating that she would not let her daughter, age 53 in [place] experience another loss; she lost her favorite cousin five years ago, and lost her father, uncle, grandmother since then. She agrees to lock guns. Although she states that she would not say her husband is not supportive, he has a stigma towards mental illness.   She has insomnia; she was diagnosed with sleep apnea and will have machine once it is approved by insurance.  She has fair appetite.  She occasionally feels anxious and  tense.  She did not have panic attacks since the last appointment.  She denies decreased need for sleep or euphoria.    Psychiatry admission: twice, last admission to [facility] in 09/2015 for depression,  Previous suicide attempt: denies  Past trials of medication: lexapro, Celexa, duloxetine, rexulti   Visit Diagnosis:    ICD-10-CM   1. Mood disorder ([place]) F39     Past Psychiatric History:  I have reviewed the patient's psychiatry history in detail and updated the patient record.   Past Medical History:  Past Medical History:  Diagnosis Date  . Anemia   . Anxiety   . Arthritis   . Chronic pain   . Complication of anesthesia    anesthesia awareness,  . DDD (degenerative disc disease)   . DDD (degenerative disc disease)   . Degenerative disc disease   . Degenerative disc disease   . Diabetes mellitus    Type 2 insulin resistant  . Dysrhythmia    stress and caffeine related  . Endometriosis   . Fibroids   . Fibromyalgia   . GERD (gastroesophageal reflux disease)   . High cholesterol   . Insomnia   . Leg cramps   . PCOS (polycystic ovarian syndrome)   . Scoliosis   . Sickle cell trait [facility])     Past Surgical History:  Procedure Laterality Date  . BACK SURGERY  2010  . BIOPSY BREAST    . CESAREAN SECTION    . DILATATION &  CURETTAGE/HYSTEROSCOPY WITH MYOSURE N/A 01/01/2015   Procedure: DILATATION & CURETTAGE/HYSTEROSCOPY WITH MYOSURE;  Surgeon: Crawford Givens, MD;  Location: [facility];  Service: Gynecology;  Laterality: N/A;  . LAPAROSCOPY  2002 or 2003  . TOE SURGERY  2008  . WISDOM TOOTH EXTRACTION      Family Psychiatric History:  I have reviewed the patient's family history in detail and updated the patient record.  Family History:  Family History  Problem Relation Age of Onset  . Diabetes Father   . Hypertension Father   . Hyperlipidemia Father   . Kidney failure Father   . Diabetes Paternal Grandfather   . Hypertension Paternal Grandfather   . Hyperlipidemia  Paternal Grandfather   . Diabetes Paternal Grandmother   . Hypertension Paternal Grandmother   . Hyperlipidemia Paternal Grandmother   . Alzheimer's disease Maternal Grandmother   . Brain cancer Maternal Grandfather     Social History:  Social History   Socioeconomic History  . Marital status: Married    Spouse name: Not on file  . Number of children: Not on file  . Years of education: Not on file  . Highest education level: Not on file  Occupational History  . Not on file  Social Needs  . Financial resource strain: Not on file  . Food insecurity:    Worry: Not on file    Inability: Not on file  . Transportation needs:    Medical: Not on file    Non-medical: Not on file  Tobacco Use  . Smoking status: Never Smoker  . Smokeless tobacco: Never Used  Substance and Sexual Activity  . Alcohol use: No    Alcohol/week: 0.0 oz  . Drug use: No  . Sexual activity: Not on file  Lifestyle  . Physical activity:    Days per week: Not on file    Minutes per session: Not on file  . Stress: Not on file  Relationships  . Social connections:    Talks on phone: Not on file    Gets together: Not on file    Attends religious service: Not on file    Active member of club or organization: Not on file    Attends meetings of clubs or organizations: Not on file    Relationship status: Not on file  Other Topics Concern  . Not on file  Social History Narrative  . Not on file    Allergies:  Allergies  Allergen Reactions  . Macrobid [Nitrofurantoin Macrocrystal] Nausea Only    Headache and "severe abd cramps"  . Shellfish Allergy Swelling    Itching of lips and ears, NOT allergic to iodine contrast  . Sulfa Antibiotics Other (See Comments)    Dizzy and sees spots with sulfa drugs    Metabolic Disorder Labs: Lab Results  Component Value Date   HGBA1C (H) 04/14/2010    10.9 (NOTE)                                                                       According to the ADA Clinical  Practice Recommendations for 2011, when HbA1c is used as a screening test:   >=6.5%   Diagnostic of Diabetes Mellitus           (  if abnormal result  is confirmed)  5.7-6.4%   Increased risk of developing Diabetes Mellitus  References:Diagnosis and Classification of Diabetes Mellitus,Diabetes XAJO,8786,76(HMCNO 1):S62-S69 and Standards of Medical Care in         Diabetes - 2011,Diabetes BSJG,2836,62  (Suppl 1):S11-S61.   MPG 266 (H) 04/14/2010   Lab Results  Component Value Date   PROLACTIN 6.7 07/22/2012   Lab Results  Component Value Date   CHOL (H) 04/14/2010    209        ATP III CLASSIFICATION:  <200     mg/dL   Desirable  200-239  mg/dL   Borderline High  >=240    mg/dL   High          TRIG 123 04/14/2010   HDL 35 (L) 04/14/2010   CHOLHDL 6.0 04/14/2010   VLDL 25 04/14/2010   LDLCALC (H) 04/14/2010    149        Total Cholesterol/HDL:CHD Risk Coronary Heart Disease Risk Table                     Men   Women  1/2 Average Risk   3.4   3.3  Average Risk       5.0   4.4  2 X Average Risk   9.6   7.1  3 X Average Risk  23.4   11.0        Use the calculated Patient Ratio above and the CHD Risk Table to determine the patient's CHD Risk.        ATP III CLASSIFICATION (LDL):  <100     mg/dL   Optimal  100-129  mg/dL   Near or Above                    Optimal  130-159  mg/dL   Borderline  160-189  mg/dL   High  >190     mg/dL   Very High   Lab Results  Component Value Date   TSH 0.676 07/22/2012   TSH 0.745 04/14/2010    Therapeutic Level Labs: No results found for: LITHIUM No results found for: VALPROATE No components found for:  CBMZ  Current Medications: Current Outpatient Medications  Medication Sig Dispense Refill  . DULoxetine (CYMBALTA) 30 MG capsule Take 1 capsule (30 mg total) by mouth daily. 30 capsule 1  . glimepiride (AMARYL) 2 MG tablet Take 2 mg by mouth daily with breakfast.    . ibuprofen (ADVIL,MOTRIN) 800 MG tablet Take 1 tablet (800 mg total)  by mouth 3 (three) times daily. 21 tablet 0  . Insulin Degludec (TRESIBA) 100 UNIT/ML SOLN Inject into the skin.    . Insulin Degludec-Liraglutide (XULTOPHY [place]) Inject 20 Units into the skin daily.    . Insulin Regular Human (AFREZZA IN) Inhale into the lungs.    . Insulin Regular Human (NOVOLIN R IJ) Inject 2-4 Units as directed.    . liraglutide (VICTOZA) 18 MG/3ML SOPN Inject into the skin.    Marland Kitchen Morphine-Naltrexone (EMBEDA) 30-1.2 MG CPCR Take 1.2 mg by mouth every morning.    . pioglitazone-glimepiride (DUETACT) 30-4 MG tablet TK 1 T PO QD  3  . Pregabalin (LYRICA PO) Take 150 mg by mouth 2 (two) times daily.     . valACYclovir (VALTREX) 500 MG tablet Take 1 tablet (500 mg total) by mouth daily. 30 tablet 11  . Brexpiprazole (REXULTI) 0.5 MG TABS Take 1 tablet (0.5 mg total) by mouth 2 (  two) times daily. (Patient not taking: Reported on 08/25/2017) 60 tablet 1  . buPROPion (WELLBUTRIN XL) 150 MG 24 hr tablet Take 1 tablet (150 mg total) by mouth daily. 30 tablet 1  . methocarbamol (ROBAXIN) 500 MG tablet Take 1 tablet (500 mg total) by mouth 2 (two) times daily. (Patient not taking: Reported on 08/04/2017) 20 tablet 0  . NABUMETONE PO Take by mouth.    . phentermine 37.5 MG capsule Take 37.5 mg by mouth every morning.    . Tapentadol HCl (NUCYNTA PO) Take 75 mg by mouth.     No current facility-administered medications for this visit.      Musculoskeletal: Strength & Muscle Tone: within normal limits Gait & Station: normal Patient leans: N/A  Psychiatric Specialty Exam: Review of Systems  Musculoskeletal: Positive for back pain.  Psychiatric/Behavioral: Positive for depression and suicidal ideas. Negative for hallucinations, memory loss and substance abuse. The patient is nervous/anxious and has insomnia.   All other systems reviewed and are negative.   Blood pressure 124/81, pulse 84, height 5\' 8"  (1.727 m), weight 236 lb (107 kg), last menstrual period 11/13/2015, SpO2 97 %.Body  mass index is 35.88 kg/m.  General Appearance: Fairly Groomed  Eye Contact:  Good  Speech:  Clear and Coherent  Volume:  Normal  Mood:  Depressed  Affect:  Appropriate, Constricted and dysphoric, tearful at times  Thought Process:  Coherent  Orientation:  Full (Time, Place, and Person)  Thought Content: Logical   Suicidal Thoughts:  Yes.  without intent/plan  Homicidal Thoughts:  No  Memory:  Immediate;   Good  Judgement:  Good  Insight:  Fair  Psychomotor Activity:  Normal  Concentration:  Concentration: Good and Attention Span: Good  Recall:  Good  Fund of Knowledge: Good  Language: NA  Akathisia:  No  Handed:  Right  AIMS (if indicated): not done  Assets:  Communication Skills Desire for Improvement  ADL's:  Intact  Cognition: WNL  Sleep:  Poor   Screenings: AIMS     Admission (Discharged) from 10/12/2015 in [facility]  AIMS Total Score  0    AUDIT     Admission (Discharged) from 10/12/2015 in [facility]  Alcohol Use Disorder Identification Test Final Score (AUDIT)  0      Assessment and Plan:  Karen Stein is a 47 y.o. year old female with a history of mood disorder, sleep apnea , who presents for follow up appointment for Mood disorder ([place])    # Unspecified mood disorder # MDD, moderate, recurrent without psychotic features Exam is notable for constricted, dysphoric affect and patient endorses neurovegetative symptoms. Psychosocial stressors include being unable to work due to depression, which causes her financial strain and limited support from her husband. Although she was diagnosed with bipolar disorder at other practice, she denies any significant manic symptoms (this eval appears to be consistent with Dr. De Nurse). Will continue to monitor. Will switch from duloxetine to Wellbutrin given adverse reaction of shaking in her lip. No known history of seizure. Discussed potential risk of  worsening anxiety, headache, and medication induced mania. Will hold rexulti at this time as the insurance has not approved it. Noted that the patient voiced concern for follow up due to financial strain; she is advised to have regular follow up given severity of her symptoms. She agrees with plans and also agrees to contact the office for any worsening in symptoms/side effect.  Plan 1. Start Wellbutrin 150 mg daily  2. Hold rexulti (insurance declined) 3. Return to clinic in one month for 30 mins  4. Lock your guns at home 5.  Emergency resources which includes 911, ED, suicide crisis line [phone]) are discussed.   The patient demonstrates the following risk factors for suicide: Chronic risk factors for suicide include: psychiatric disorder of depression and completed suicide in a family member. Acute risk factors for suicide include: N/A. Protective factors for this patient include: responsibility to others (children, family), coping skills and hope for the future. Considering these factors, the overall suicide risk at this point appears to be moderate, but not at imminent danger to self. Patient is appropriate for outpatient follow up. She agrees to lock the guns at home.   [name], MD 08/25/2017, 12:16 PM

## 2017-08-25 ENCOUNTER — Encounter (HOSPITAL_COMMUNITY): Payer: Self-pay | Admitting: Psychiatry

## 2017-08-25 ENCOUNTER — Ambulatory Visit (INDEPENDENT_AMBULATORY_CARE_PROVIDER_SITE_OTHER): Payer: BLUE CROSS/BLUE SHIELD | Admitting: Psychiatry

## 2017-08-25 VITALS — BP 124/81 | HR 84 | Ht 68.0 in | Wt 236.0 lb

## 2017-08-25 DIAGNOSIS — M549 Dorsalgia, unspecified: Secondary | ICD-10-CM

## 2017-08-25 DIAGNOSIS — F329 Major depressive disorder, single episode, unspecified: Secondary | ICD-10-CM | POA: Insufficient documentation

## 2017-08-25 DIAGNOSIS — F419 Anxiety disorder, unspecified: Secondary | ICD-10-CM

## 2017-08-25 DIAGNOSIS — R450 Nervousness: Secondary | ICD-10-CM | POA: Diagnosis not present

## 2017-08-25 DIAGNOSIS — G4700 Insomnia, unspecified: Secondary | ICD-10-CM

## 2017-08-25 DIAGNOSIS — R45851 Suicidal ideations: Secondary | ICD-10-CM

## 2017-08-25 DIAGNOSIS — Z810 Family history of intellectual disabilities: Secondary | ICD-10-CM | POA: Diagnosis not present

## 2017-08-25 DIAGNOSIS — F32A Depression, unspecified: Secondary | ICD-10-CM | POA: Insufficient documentation

## 2017-08-25 DIAGNOSIS — F39 Unspecified mood [affective] disorder: Secondary | ICD-10-CM | POA: Diagnosis not present

## 2017-08-25 MED ORDER — BUPROPION HCL ER (XL) 150 MG PO TB24: 150.0000 mg | ORAL_TABLET | Freq: Every day | ORAL | 1 refills | Status: DC

## 2017-08-25 NOTE — Patient Instructions (Addendum)
1. Start Wellbutrin 150 mg daily  2. Hold rexulti (insurance declined) 3. Return to clinic in one month for 30 mins 4. Lock your guns at home 5. CONTACT INFORMATION  What to do if you need to get in touch with someone regarding a psychiatric issue:  1. EMERGENCY: For psychiatric emergencies (if you are suicidal or if there are any other safety issues) call 911 and/or go to your nearest Emergency Room immediately.   2. IF YOU NEED SOMEONE TO TALK TO RIGHT NOW: Given my clinical responsibilities, I may not be able to speak with you over the phone for a prolonged period of time.  a. You may always call The National Suicide Prevention Lifeline at 1-800-273-TALK 914-804-9195).  b. Your county of residence will also have local crisis services. For [facility]: [place] at [phone]

## 2017-09-27 ENCOUNTER — Emergency Department (HOSPITAL_BASED_OUTPATIENT_CLINIC_OR_DEPARTMENT_OTHER)
Admission: EM | Admit: 2017-09-27 | Discharge: 2017-09-27 | Disposition: A | Discharge status: Home / Self Care | Payer: BLUE CROSS/BLUE SHIELD | Attending: Emergency Medicine | Admitting: Emergency Medicine

## 2017-09-27 ENCOUNTER — Encounter (HOSPITAL_BASED_OUTPATIENT_CLINIC_OR_DEPARTMENT_OTHER): Payer: Self-pay

## 2017-09-27 ENCOUNTER — Other Ambulatory Visit: Payer: Self-pay

## 2017-09-27 DIAGNOSIS — E119 Type 2 diabetes mellitus without complications: Secondary | ICD-10-CM | POA: Insufficient documentation

## 2017-09-27 DIAGNOSIS — R0602 Shortness of breath: Secondary | ICD-10-CM | POA: Diagnosis present

## 2017-09-27 DIAGNOSIS — G8929 Other chronic pain: Secondary | ICD-10-CM | POA: Diagnosis not present

## 2017-09-27 DIAGNOSIS — Z79899 Other long term (current) drug therapy: Secondary | ICD-10-CM | POA: Diagnosis not present

## 2017-09-27 DIAGNOSIS — Z794 Long term (current) use of insulin: Secondary | ICD-10-CM | POA: Diagnosis not present

## 2017-09-27 DIAGNOSIS — G894 Chronic pain syndrome: Secondary | ICD-10-CM

## 2017-09-27 LAB — PREGNANCY, URINE: PREG TEST UR: NEGATIVE

## 2017-09-27 MED ORDER — KETOROLAC TROMETHAMINE 30 MG/ML IJ SOLN
30.0000 mg | Freq: Once | INTRAMUSCULAR | Status: AC
  Administered 2017-09-27: 30 mg via INTRAVENOUS

## 2017-09-27 MED ORDER — KETOROLAC TROMETHAMINE 30 MG/ML IJ SOLN
30.0000 mg | Freq: Once | INTRAMUSCULAR | Status: DC
  Filled 2017-09-27: qty 1

## 2017-09-27 MED ORDER — DIAZEPAM 5 MG PO TABS
5.0000 mg | ORAL_TABLET | Freq: Once | ORAL | Status: AC
  Administered 2017-09-27: 5 mg via ORAL
  Filled 2017-09-27: qty 1

## 2017-09-27 MED ORDER — DIAZEPAM 5 MG PO TABS: 5.0000 mg | ORAL_TABLET | Freq: Two times a day (BID) | ORAL | 0 refills | Status: DC | PRN

## 2017-09-27 NOTE — ED Triage Notes (Signed)
Pt states having a fibromyalgia flare up this week, states pain to neck, shoulders, back, ankles, wrist and pain to breath; pt in no distress

## 2017-09-27 NOTE — ED Provider Notes (Signed)
[place] EMERGENCY DEPARTMENT Provider Note   CSN: [id number] Arrival date & time: 09/27/17  8242     History   Chief Complaint Chief Complaint  Patient presents with  . Shortness of Breath    HPI Karen Stein is a 47 y.o. female.  47 year old female with past medical history including fibromyalgia, chronic pain, anxiety, type 2 diabetes mellitus, hyperlipidemia who presents with pain.  This week the patient states that she has had a "fibromyalgia flareup" involving pain diffusely including in her neck, back, shoulders, ankles, and wrists. Pain is severe and when pain worsens she feels short of breath. No fevers, cough/cold symptoms, vomiting, diarrhea, or recent illness. She has not taken any of her daily medications yet today. She states that she has a "flare up" like this ~every 6 months.   The history is provided by the patient.  Shortness of Breath     Past Medical History:  Diagnosis Date  . Anemia   . Anxiety   . Arthritis   . Chronic pain   . Complication of anesthesia    anesthesia awareness,  . DDD (degenerative disc disease)   . DDD (degenerative disc disease)   . Degenerative disc disease   . Degenerative disc disease   . Diabetes mellitus    Type 2 insulin resistant  . Dysrhythmia    stress and caffeine related  . Endometriosis   . Fibroids   . Fibromyalgia   . GERD (gastroesophageal reflux disease)   . High cholesterol   . Insomnia   . Leg cramps   . PCOS (polycystic ovarian syndrome)   . Scoliosis   . Sickle cell trait [facility])     Patient Active Problem List   Diagnosis Date Noted  . Mood disorder ([place]) 08/25/2017  . Severe episode of recurrent major depressive disorder, without psychotic features ([place])   . Major depressive disorder, recurrent severe without psychotic features ([place]) 10/12/2015  . Depression, major, severe recurrence ([place]) 10/12/2015  . Degenerative disc disease   . Sickle cell trait ([place])   . DDD (degenerative  disc disease)     Past Surgical History:  Procedure Laterality Date  . BACK SURGERY  2010  . BIOPSY BREAST    . CESAREAN SECTION    . DILATATION & CURETTAGE/HYSTEROSCOPY WITH MYOSURE N/A 01/01/2015   Procedure: DILATATION & CURETTAGE/HYSTEROSCOPY WITH MYOSURE;  Surgeon: Crawford Givens, MD;  Location: [facility];  Service: Gynecology;  Laterality: N/A;  . LAPAROSCOPY  2002 or 2003  . TOE SURGERY  2008  . WISDOM TOOTH EXTRACTION       OB History    Gravida  4   Para  2   Term  1   Preterm  1   AB  2   Living  1     SAB  2   TAB      Ectopic      Multiple      Live Births               Home Medications    Prior to Admission medications   Medication Sig Start Date End Date Taking? Authorizing Provider  Brexpiprazole (REXULTI) 0.5 MG TABS Take 1 tablet (0.5 mg total) by mouth 2 (two) times daily. Patient not taking: Reported on 08/25/2017 08/04/17 08/04/18  Merian Capron, MD  buPROPion (WELLBUTRIN XL) 150 MG 24 hr tablet Take 1 tablet (150 mg total) by mouth daily. 08/25/17   [name], MD  glimepiride (AMARYL) 2  MG tablet Take 2 mg by mouth daily with breakfast.    [provider]  ibuprofen (ADVIL,MOTRIN) 800 MG tablet Take 1 tablet (800 mg total) by mouth 3 (three) times daily. 10/31/16   Palumbo, April, MD  Insulin Degludec (TRESIBA) 100 UNIT/ML SOLN Inject into the skin.    [provider]  NABUMETONE PO Take by mouth.    [provider]  pioglitazone-glimepiride (DUETACT) 30-4 MG tablet TK 1 T PO QD 06/18/17   [provider]  Pregabalin (LYRICA PO) Take 150 mg by mouth 2 (two) times daily.     [provider]  valACYclovir (VALTREX) 500 MG tablet Take 1 tablet (500 mg total) by mouth daily. 10/18/15   Kerrie Buffalo, NP  gabapentin (NEURONTIN) 100 MG capsule Take 2 capsules (200 mg total) by mouth 3 (three) times daily. Patient not taking: Reported on 06/27/2016 10/18/15 08/04/17  Kerrie Buffalo, NP    Family  History Family History  Problem Relation Age of Onset  . Diabetes Father   . Hypertension Father   . Hyperlipidemia Father   . Kidney failure Father   . Diabetes Paternal Grandfather   . Hypertension Paternal Grandfather   . Hyperlipidemia Paternal Grandfather   . Diabetes Paternal Grandmother   . Hypertension Paternal Grandmother   . Hyperlipidemia Paternal Grandmother   . Alzheimer's disease Maternal Grandmother   . Brain cancer Maternal Grandfather     Social History Social History   Tobacco Use  . Smoking status: Never Smoker  . Smokeless tobacco: Never Used  Substance Use Topics  . Alcohol use: No    Alcohol/week: 0.0 oz  . Drug use: No     Allergies   Macrobid [nitrofurantoin macrocrystal]; Shellfish allergy; and Sulfa antibiotics   Review of Systems Review of Systems  Respiratory: Positive for shortness of breath.    All other systems reviewed and are negative except that which was mentioned in HPI   Physical Exam Updated Vital Signs BP (!) 152/74 (BP Location: Right Arm)   Pulse 78   Temp 98.8 F (37.1 C) (Oral)   Resp 20   Ht 5\' 8"  (1.727 m)   Wt 106.6 kg (235 lb)   LMP 11/13/2015 (Exact Date)   SpO2 100%   BMI 35.73 kg/m   Physical Exam  Constitutional: She is oriented to person, place, and time. She appears well-developed and well-nourished.  uncomfortable  HENT:  Head: Normocephalic and atraumatic.  Moist mucous membranes  Eyes: Pupils are equal, round, and reactive to light. Conjunctivae are normal.  Neck: Neck supple.  Cardiovascular: Normal rate, regular rhythm and normal heart sounds.  No murmur heard. Pulmonary/Chest: Effort normal and breath sounds normal.  Abdominal: Soft. Bowel sounds are normal. She exhibits no distension. There is no tenderness.  Musculoskeletal: She exhibits no edema.  Neurological: She is alert and oriented to person, place, and time.  Fluent speech  Skin: Skin is warm and dry.  Psychiatric: Judgment  normal. Her mood appears anxious.  distressed  Nursing note and vitals reviewed.    ED Treatments / Results  Labs (all labs ordered are listed, but only abnormal results are displayed) Labs Reviewed  PREGNANCY, URINE    EKG EKG Interpretation  Date/Time:  Thursday Sep 27 2017 08:42:49 EDT Ventricular Rate:  66 PR Interval:    QRS Duration: 87 QT Interval:  380 QTC Calculation: 399 R Axis:   41 Text Interpretation:  Sinus rhythm Probable left atrial enlargement No significant change since last tracing  Confirmed by Theotis Burrow [phone]) on 09/27/2017 8:49:31 AM   Radiology No results found.  Procedures Procedures (including critical care time)  Medications Ordered in ED Medications  diazepam (VALIUM) tablet 5 mg (5 mg Oral Given 09/27/17 1019)  ketorolac (TORADOL) 30 MG/ML injection 30 mg (30 mg Intravenous Given 09/27/17 1019)     Initial Impression / Assessment and Plan / ED Course  I have reviewed the triage vital signs and the nursing notes.  Pertinent labs that were available during my care of the patient were reviewed by me and considered in my medical decision making (see chart for details).    Patient presents with diffuse pain that she states is consistent with previous problems with fibromyalgia.  No infectious symptoms.  After receiving Toradol and Valium, the patient was resting comfortably and stated that she did have an improvement in her symptoms.  She does follow with a pain specialist and I have encouraged her to follow closely with them as an outpatient.  Provided with muscle relaxant and instructed to continue all of her home medications until follow-up.  Patient voiced understanding. Final Clinical Impressions(s) / ED Diagnoses   Final diagnoses:  None    ED Discharge Orders    None       Gorge Almanza, Wenda Overland, MD 09/27/17 [phone]

## 2017-10-01 NOTE — Progress Notes (Signed)
BH MD/PA/NP OP Progress Note  10/06/2017 2:16 PM Karen Stein  MRN:  [id number]  Chief Complaint:  Chief Complaint    Follow-up; Depression     HPI:  - Patient visited ED since the last visit for fibromyalgia pain.   She presents for follow-up appointment for depression.  She states that she has not noticed any change since the last appointment.  However, she states that she is able to focus better since starting Wellbutrin.  She is still stressed about financial strain.  She is concerned that she may lose her job after going through [place]. She has had flare up of fibromyalgia; she feels that it is a cycle for her to have pain and stress. She feels better taking valium. She ran out of this medication and has an appointment with her PCP in a few months. She reports the relationship with her husband is "the same." Although she wants her husband to apply job, he would take it that it would only help the patient. He has been unemployed since April.   She has insomnia; stating that CPAP machine did not help her sleep. She feels fatigue and depressed. She has fair appetite. She denies SI and locked her gun as discussed at the last visit. She feels anxious, tense. She denies panic attacks. She denies decreased need for sleep or euphoria. She missed a few days due to flare up of fibromyalgia.   Visit Diagnosis:    ICD-10-CM   1. 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 I have reviewed the history. No updates at this time.     Past Medical History:  Past Medical History:  Diagnosis Date  . Anemia   . Anxiety   . Arthritis   . Chronic pain   . Complication of anesthesia    anesthesia awareness,  . DDD (degenerative disc disease)   . DDD (degenerative disc disease)   . Degenerative disc disease   . Degenerative disc disease   . Diabetes mellitus    Type  2 insulin resistant  . Dysrhythmia    stress and caffeine related  . Endometriosis   . Fibroids   . Fibromyalgia   . GERD (gastroesophageal reflux disease)   . High cholesterol   . Insomnia   . Leg cramps   . PCOS (polycystic ovarian syndrome)   . Scoliosis   . Sickle cell trait [facility])     Past Surgical History:  Procedure Laterality Date  . BACK SURGERY  2010  . BIOPSY BREAST    . CESAREAN SECTION    . DILATATION & CURETTAGE/HYSTEROSCOPY WITH MYOSURE N/A 01/01/2015   Procedure: DILATATION & CURETTAGE/HYSTEROSCOPY WITH MYOSURE;  Surgeon: Crawford Givens, MD;  Location: [facility];  Service: Gynecology;  Laterality: N/A;  . LAPAROSCOPY  2002 or 2003  . TOE SURGERY  2008  . WISDOM TOOTH EXTRACTION      Family Psychiatric History: Please see initial evaluation for full details. I have reviewed the history. No updates at this time.     Family History:  Family History  Problem Relation Age of Onset  . Diabetes Father   . Hypertension Father   . Hyperlipidemia Father   . Kidney failure Father   . Diabetes Paternal Grandfather   . Hypertension Paternal Grandfather   . Hyperlipidemia Paternal Grandfather   . Diabetes Paternal Grandmother   . Hypertension Paternal Grandmother   .  Hyperlipidemia Paternal Grandmother   . Alzheimer's disease Maternal Grandmother   . Brain cancer Maternal Grandfather     Social History:  Social History   Socioeconomic History  . Marital status: Married    Spouse name: Not on file  . Number of children: Not on file  . Years of education: Not on file  . Highest education level: Not on file  Occupational History  . Not on file  Social Needs  . Financial resource strain: Not on file  . Food insecurity:    Worry: Not on file    Inability: Not on file  . Transportation needs:    Medical: Not on file    Non-medical: Not on file  Tobacco Use  . Smoking status: Never Smoker  . Smokeless tobacco: Never Used  Substance and Sexual Activity  .  Alcohol use: No    Alcohol/week: 0.0 oz  . Drug use: No  . Sexual activity: Not on file  Lifestyle  . Physical activity:    Days per week: Not on file    Minutes per session: Not on file  . Stress: Not on file  Relationships  . Social connections:    Talks on phone: Not on file    Gets together: Not on file    Attends religious service: Not on file    Active member of club or organization: Not on file    Attends meetings of clubs or organizations: Not on file    Relationship status: Not on file  Other Topics Concern  . Not on file  Social History Narrative  . Not on file    Allergies:  Allergies  Allergen Reactions  . Macrobid [Nitrofurantoin Macrocrystal] Nausea Only    Headache and "severe abd cramps"  . Shellfish Allergy Swelling    Itching of lips and ears, NOT allergic to iodine contrast  . Sulfa Antibiotics Other (See Comments)    Dizzy and sees spots with sulfa drugs    Metabolic Stein Labs: Lab Results  Component Value Date   HGBA1C (H) 04/14/2010    10.9 (NOTE)                                                                       According to the ADA Clinical Practice Recommendations for 2011, when HbA1c is used as a screening test:   >=6.5%   Diagnostic of Diabetes Mellitus           (if abnormal result  is confirmed)  5.7-6.4%   Increased risk of developing Diabetes Mellitus  References:Diagnosis and Classification of Diabetes Mellitus,Diabetes VZDG,3875,64(PPIRJ 1):S62-S69 and Standards of Medical Care in         Diabetes - 2011,Diabetes JOAC,1660,63  (Suppl 1):S11-S61.   MPG 266 (H) 04/14/2010   Lab Results  Component Value Date   PROLACTIN 6.7 07/22/2012   Lab Results  Component Value Date   CHOL (H) 04/14/2010    209        ATP III CLASSIFICATION:  <200     mg/dL   Desirable  200-239  mg/dL   Borderline High  >=240    mg/dL   High          TRIG 123 04/14/2010   HDL 35 (  L) 04/14/2010   CHOLHDL 6.0 04/14/2010   VLDL 25 04/14/2010   LDLCALC  (H) 04/14/2010    149        Total Cholesterol/HDL:CHD Risk Coronary Heart Disease Risk Table                     Men   Women  1/2 Average Risk   3.4   3.3  Average Risk       5.0   4.4  2 X Average Risk   9.6   7.1  3 X Average Risk  23.4   11.0        Use the calculated Patient Ratio above and the CHD Risk Table to determine the patient's CHD Risk.        ATP III CLASSIFICATION (LDL):  <100     mg/dL   Optimal  100-129  mg/dL   Near or Above                    Optimal  130-159  mg/dL   Borderline  160-189  mg/dL   High  >190     mg/dL   Very High   Lab Results  Component Value Date   TSH 0.676 07/22/2012   TSH 0.745 04/14/2010    Therapeutic Level Labs: No results found for: LITHIUM No results found for: VALPROATE No components found for:  CBMZ  Current Medications: Current Outpatient Medications  Medication Sig Dispense Refill  . buPROPion (WELLBUTRIN XL) 300 MG 24 hr tablet Take 1 tablet (300 mg total) by mouth daily. 30 tablet 1  . glimepiride (AMARYL) 2 MG tablet Take 2 mg by mouth daily with breakfast.    . ibuprofen (ADVIL,MOTRIN) 800 MG tablet Take 1 tablet (800 mg total) by mouth 3 (three) times daily. 21 tablet 0  . Insulin Degludec (TRESIBA) 100 UNIT/ML SOLN Inject into the skin.    Marland Kitchen NABUMETONE PO Take by mouth.    . pioglitazone-glimepiride (DUETACT) 30-4 MG tablet TK 1 T PO QD  3  . Pregabalin (LYRICA PO) Take 150 mg by mouth 2 (two) times daily.     . valACYclovir (VALTREX) 500 MG tablet Take 1 tablet (500 mg total) by mouth daily. 30 tablet 11  . diazepam (VALIUM) 5 MG tablet Take 1 tablet (5 mg total) by mouth every 12 (twelve) hours as needed for muscle spasms. (Patient not taking: Reported on 10/06/2017) 8 tablet 0   No current facility-administered medications for this visit.      Musculoskeletal: Strength & Muscle Tone: within normal limits Gait & Station: normal Patient leans: N/A  Psychiatric Specialty Exam: Review of Systems   Musculoskeletal: Positive for myalgias.  Psychiatric/Behavioral: Positive for depression. Negative for hallucinations, memory loss, substance abuse and suicidal ideas. The patient has insomnia. The patient is not nervous/anxious.   All other systems reviewed and are negative.   Blood pressure (!) 143/85, pulse 86, height 5\' 8"  (1.727 m), weight 255 lb 9.6 oz (115.9 kg), last menstrual period 11/13/2015, SpO2 98 %.Body mass index is 38.86 kg/m.  General Appearance: Fairly Groomed  Eye Contact:  Good  Speech:  Clear and Coherent  Volume:  Normal  Mood:  Depressed  Affect:  Constricted- improving  Thought Process:  Coherent  Orientation:  Full (Time, Place, and Person)  Thought Content: Logical   Suicidal Thoughts:  No  Homicidal Thoughts:  No  Memory:  Immediate;   Good  Judgement:  Good  Insight:  Fair  Psychomotor Activity:  Normal  Concentration:  Concentration: Good and Attention Span: Good  Recall:  Good  Fund of Knowledge: Good  Language: Good  Akathisia:  No  Handed:  Right  AIMS (if indicated): not done  Assets:  Communication Skills Desire for Improvement  ADL's:  Intact  Cognition: WNL  Sleep:  Poor   Screenings: AIMS     Admission (Discharged) from 10/12/2015 in [facility]  AIMS Total Score  0    AUDIT     Admission (Discharged) from 10/12/2015 in [facility]  Alcohol Use Stein Identification Test Final Score (AUDIT)  0       Assessment and Plan:  JAYLEIGH NOTARIANNI is a 47 y.o. year old female with a history of mood Stein, sleep apnea, hypercholesterolemia, Asthma, Fibromyalgia, DDD, who presents for follow up appointment for Major depressive Stein, recurrent severe without psychotic features ([place])  # MDD, moderate, recurrent without psychotic features Exam is notable for constricted affect, although there is some improvement in neurovegetative symptoms after starting Wellbutrin.   Psychosocial stressors including financial strain, marital discordance, and pain.  Will do up titration of Wellbutrin to target residual neurovegetative symptoms.  Discussed risk of headache, worsening anxiety.  she has no known history of seizure.  May consider adding nortriptyline in the future to target mood symptoms and pain (she reports history of cardiomegaly and family history of cardiac disease).  Noted that although she was diagnosed with bipolar Stein  at the other practice, she denies any manic symptoms. It is less likely that she has underlying bipolar Stein. She reports benefit from valium, prescribed at ED; will not prescribe this at this time given risk of dependence. She is advised to follow up with her PCP for appropriate pain management.   Plan 1. Increase Wellbutrin 300 mg daily  2. Return to clinic in one month for 30 mins - she locked the guns at home  The patient demonstrates the following risk factors for suicide: Chronic risk factors for suicide include: psychiatric Stein of depression and completed suicide in a family member. Acute risk factors for suicide include: N/A. Protective factors for this patient include: responsibility to others (children, family), coping skills and hope for the future. Considering these factors, the overall suicide risk at this point appears to be moderate, but not at imminent danger to self. Patient is appropriate for outpatient follow up. She agrees to lock the guns at home.   The duration of this appointment visit was 30 minutes of face-to-face time with the patient.  Greater than 50% of this time was spent in counseling, explanation of  diagnosis, planning of further management, and coordination of care.  [name], MD 10/06/2017, 2:16 PM

## 2017-10-06 ENCOUNTER — Encounter (HOSPITAL_COMMUNITY): Payer: Self-pay | Admitting: Psychiatry

## 2017-10-06 ENCOUNTER — Ambulatory Visit (INDEPENDENT_AMBULATORY_CARE_PROVIDER_SITE_OTHER): Payer: BLUE CROSS/BLUE SHIELD | Admitting: Psychiatry

## 2017-10-06 VITALS — BP 143/85 | HR 86 | Ht 68.0 in | Wt 255.6 lb

## 2017-10-06 DIAGNOSIS — Z630 Problems in relationship with spouse or partner: Secondary | ICD-10-CM | POA: Diagnosis not present

## 2017-10-06 DIAGNOSIS — M797 Fibromyalgia: Secondary | ICD-10-CM | POA: Diagnosis not present

## 2017-10-06 DIAGNOSIS — Z818 Family history of other mental and behavioral disorders: Secondary | ICD-10-CM

## 2017-10-06 DIAGNOSIS — Z562 Threat of job loss: Secondary | ICD-10-CM | POA: Diagnosis not present

## 2017-10-06 DIAGNOSIS — G4700 Insomnia, unspecified: Secondary | ICD-10-CM | POA: Diagnosis not present

## 2017-10-06 DIAGNOSIS — F332 Major depressive disorder, recurrent severe without psychotic features: Secondary | ICD-10-CM

## 2017-10-06 DIAGNOSIS — F419 Anxiety disorder, unspecified: Secondary | ICD-10-CM

## 2017-10-06 DIAGNOSIS — Z598 Other problems related to housing and economic circumstances: Secondary | ICD-10-CM | POA: Diagnosis not present

## 2017-10-06 DIAGNOSIS — Z810 Family history of intellectual disabilities: Secondary | ICD-10-CM

## 2017-10-06 MED ORDER — BUPROPION HCL ER (XL) 300 MG PO TB24: 300.0000 mg | ORAL_TABLET | Freq: Every day | ORAL | 1 refills | Status: DC

## 2017-10-06 NOTE — Patient Instructions (Signed)
1. Increase Wellbutrin 300 mg daily  2. Return to clinic in one month for 30 mins

## 2017-11-07 ENCOUNTER — Other Ambulatory Visit: Payer: Self-pay | Admitting: Obstetrics and Gynecology

## 2017-11-07 DIAGNOSIS — N644 Mastodynia: Secondary | ICD-10-CM

## 2017-11-09 ENCOUNTER — Other Ambulatory Visit: Payer: Self-pay

## 2017-11-14 ENCOUNTER — Ambulatory Visit
Admission: RE | Admit: 2017-11-14 | Discharge: 2017-11-14 | Disposition: A | Discharge status: Home / Self Care | Payer: BLUE CROSS/BLUE SHIELD | Source: Ambulatory Visit | Attending: Obstetrics and Gynecology | Admitting: Obstetrics and Gynecology

## 2017-11-14 DIAGNOSIS — N644 Mastodynia: Secondary | ICD-10-CM

## 2017-11-15 NOTE — Progress Notes (Deleted)
BH MD/PA/NP OP Progress Note  11/15/2017 8:12 AM Karen Stein  MRN:  [id number]  Chief Complaint:  HPI: *** Visit Diagnosis: No diagnosis found.  Past Psychiatric History: Please see initial evaluation for full details. I have reviewed the history. No updates at this time.     Past Medical History:  Past Medical History:  Diagnosis Date  . Anemia   . Anxiety   . Arthritis   . Chronic pain   . Complication of anesthesia    anesthesia awareness,  . DDD (degenerative disc disease)   . DDD (degenerative disc disease)   . Degenerative disc disease   . Degenerative disc disease   . Diabetes mellitus    Type 2 insulin resistant  . Dysrhythmia    stress and caffeine related  . Endometriosis   . Fibroids   . Fibromyalgia   . GERD (gastroesophageal reflux disease)   . High cholesterol   . Insomnia   . Leg cramps   . PCOS (polycystic ovarian syndrome)   . Scoliosis   . Sickle cell trait [facility])     Past Surgical History:  Procedure Laterality Date  . BACK SURGERY  2010  . BIOPSY BREAST    . CESAREAN SECTION    . DILATATION & CURETTAGE/HYSTEROSCOPY WITH MYOSURE N/A 01/01/2015   Procedure: DILATATION & CURETTAGE/HYSTEROSCOPY WITH MYOSURE;  Surgeon: Crawford Givens, MD;  Location: [facility];  Service: Gynecology;  Laterality: N/A;  . LAPAROSCOPY  2002 or 2003  . TOE SURGERY  2008  . WISDOM TOOTH EXTRACTION      Family Psychiatric History: Please see initial evaluation for full details. I have reviewed the history. No updates at this time.     Family History:  Family History  Problem Relation Age of Onset  . Diabetes Father   . Hypertension Father   . Hyperlipidemia Father   . Kidney failure Father   . Diabetes Paternal Grandfather   . Hypertension Paternal Grandfather   . Hyperlipidemia Paternal Grandfather   . Diabetes Paternal Grandmother   . Hypertension Paternal Grandmother   . Hyperlipidemia Paternal Grandmother   . Alzheimer's disease Maternal Grandmother   .  Brain cancer Maternal Grandfather     Social History:  Social History   Socioeconomic History  . Marital status: Married    Spouse name: Not on file  . Number of children: Not on file  . Years of education: Not on file  . Highest education level: Not on file  Occupational History  . Not on file  Social Needs  . Financial resource strain: Not on file  . Food insecurity:    Worry: Not on file    Inability: Not on file  . Transportation needs:    Medical: Not on file    Non-medical: Not on file  Tobacco Use  . Smoking status: Never Smoker  . Smokeless tobacco: Never Used  Substance and Sexual Activity  . Alcohol use: No    Alcohol/week: 0.0 oz  . Drug use: No  . Sexual activity: Not on file  Lifestyle  . Physical activity:    Days per week: Not on file    Minutes per session: Not on file  . Stress: Not on file  Relationships  . Social connections:    Talks on phone: Not on file    Gets together: Not on file    Attends religious service: Not on file    Active member of club or organization: Not on file  Attends meetings of clubs or organizations: Not on file    Relationship status: Not on file  Other Topics Concern  . Not on file  Social History Narrative  . Not on file    Allergies:  Allergies  Allergen Reactions  . Macrobid [Nitrofurantoin Macrocrystal] Nausea Only    Headache and "severe abd cramps"  . Shellfish Allergy Swelling    Itching of lips and ears, NOT allergic to iodine contrast  . Sulfa Antibiotics Other (See Comments)    Dizzy and sees spots with sulfa drugs    Metabolic Disorder Labs: Lab Results  Component Value Date   HGBA1C (H) 04/14/2010    10.9 (NOTE)                                                                       According to the ADA Clinical Practice Recommendations for 2011, when HbA1c is used as a screening test:   >=6.5%   Diagnostic of Diabetes Mellitus           (if abnormal result  is confirmed)  5.7-6.4%   Increased  risk of developing Diabetes Mellitus  References:Diagnosis and Classification of Diabetes Mellitus,Diabetes YHCW,2376,28(BTDVV 1):S62-S69 and Standards of Medical Care in         Diabetes - 2011,Diabetes OHYW,7371,06  (Suppl 1):S11-S61.   MPG 266 (H) 04/14/2010   Lab Results  Component Value Date   PROLACTIN 6.7 07/22/2012   Lab Results  Component Value Date   CHOL (H) 04/14/2010    209        ATP III CLASSIFICATION:  <200     mg/dL   Desirable  200-239  mg/dL   Borderline High  >=240    mg/dL   High          TRIG 123 04/14/2010   HDL 35 (L) 04/14/2010   CHOLHDL 6.0 04/14/2010   VLDL 25 04/14/2010   LDLCALC (H) 04/14/2010    149        Total Cholesterol/HDL:CHD Risk Coronary Heart Disease Risk Table                     Men   Women  1/2 Average Risk   3.4   3.3  Average Risk       5.0   4.4  2 X Average Risk   9.6   7.1  3 X Average Risk  23.4   11.0        Use the calculated Patient Ratio above and the CHD Risk Table to determine the patient's CHD Risk.        ATP III CLASSIFICATION (LDL):  <100     mg/dL   Optimal  100-129  mg/dL   Near or Above                    Optimal  130-159  mg/dL   Borderline  160-189  mg/dL   High  >190     mg/dL   Very High   Lab Results  Component Value Date   TSH 0.676 07/22/2012   TSH 0.745 04/14/2010    Therapeutic Level Labs: No results found for: LITHIUM No results found for: VALPROATE No components found for:  CBMZ  Current Medications: Current Outpatient Medications  Medication Sig Dispense Refill  . buPROPion (WELLBUTRIN XL) 300 MG 24 hr tablet Take 1 tablet (300 mg total) by mouth daily. 30 tablet 1  . diazepam (VALIUM) 5 MG tablet Take 1 tablet (5 mg total) by mouth every 12 (twelve) hours as needed for muscle spasms. (Patient not taking: Reported on 10/06/2017) 8 tablet 0  . glimepiride (AMARYL) 2 MG tablet Take 2 mg by mouth daily with breakfast.    . ibuprofen (ADVIL,MOTRIN) 800 MG tablet Take 1 tablet (800 mg total)  by mouth 3 (three) times daily. 21 tablet 0  . Insulin Degludec (TRESIBA) 100 UNIT/ML SOLN Inject into the skin.    Marland Kitchen NABUMETONE PO Take by mouth.    . pioglitazone-glimepiride (DUETACT) 30-4 MG tablet TK 1 T PO QD  3  . Pregabalin (LYRICA PO) Take 150 mg by mouth 2 (two) times daily.     . valACYclovir (VALTREX) 500 MG tablet Take 1 tablet (500 mg total) by mouth daily. 30 tablet 11   No current facility-administered medications for this visit.      Musculoskeletal: Strength & Muscle Tone: within normal limits Gait & Station: normal Patient leans: N/A  Psychiatric Specialty Exam: ROS  Last menstrual period 11/13/2015.There is no height or weight on file to calculate BMI.  General Appearance: Fairly Groomed  Eye Contact:  Good  Speech:  Clear and Coherent  Volume:  Normal  Mood:  {BHH MOOD:22306}  Affect:  {Affect (PAA):22687}  Thought Process:  Coherent  Orientation:  Full (Time, Place, and Person)  Thought Content: Logical   Suicidal Thoughts:  {ST/HT (PAA):22692}  Homicidal Thoughts:  {ST/HT (PAA):22692}  Memory:  Immediate;   Good  Judgement:  {Judgement (PAA):22694}  Insight:  {Insight (PAA):22695}  Psychomotor Activity:  Normal  Concentration:  Concentration: Good and Attention Span: Good  Recall:  Good  Fund of Knowledge: Good  Language: Good  Akathisia:  No  Handed:  Right  AIMS (if indicated): not done  Assets:  Communication Skills Desire for Improvement  ADL's:  Intact  Cognition: WNL  Sleep:  {BHH GOOD/FAIR/POOR:22877}   Screenings: AIMS     Admission (Discharged) from 10/12/2015 in [facility]  AIMS Total Score  0    AUDIT     Admission (Discharged) from 10/12/2015 in [facility]  Alcohol Use Disorder Identification Test Final Score (AUDIT)  0       Assessment and Plan:  Karen Stein is a 47 y.o. year old female with a history of mood disorder, Fibromyalgia, sleep apnea,  hypercholesterolemia, Asthma, DDD , who presents for follow up appointment for No diagnosis found.  # MDD, moderate, recurrent without psychotic features  Exam is notable for constricted affect, although there is some improvement in neurovegetative symptoms after starting Wellbutrin.  Psychosocial stressors including financial strain, marital discordance, and pain.  Will do up titration of Wellbutrin to target residual neurovegetative symptoms.  Discussed risk of headache, worsening anxiety.  she has no known history of seizure.  May consider adding nortriptyline in the future to target mood symptoms and pain (she reports history of cardiomegaly and family history of cardiac disease).  Noted that although she was diagnosed with bipolar disorder  at the other practice, she denies any manic symptoms. It is less likely that she has underlying bipolar disorder. She reports benefit from valium, prescribed at ED; will not prescribe this at this time given risk  of dependence. She is advised to follow up with her PCP for appropriate pain management.   Plan 1. Increase Wellbutrin 300 mg daily  2. Return to clinic in one month for 30 mins - she locked the guns at home  The patient demonstrates the following risk factors for suicide: Chronic risk factors for suicide include:psychiatric disorder ofdepressionand completed suicide in a family member. Acute risk factorsfor suicide include: N/A. Protective factorsfor this patient include: responsibility to others (children, family), coping skills and hope for the future. Considering these factors, the overall suicide risk at this point appears to bemoderate, but not at imminent danger to self. Patientisappropriate for outpatient follow up. She agrees to lock the guns at home.    [name], MD 11/15/2017, 8:12 AM

## 2017-11-17 ENCOUNTER — Ambulatory Visit (HOSPITAL_COMMUNITY): Payer: BLUE CROSS/BLUE SHIELD | Admitting: Psychiatry

## 2018-01-11 ENCOUNTER — Other Ambulatory Visit (HOSPITAL_COMMUNITY): Payer: Self-pay | Admitting: Obstetrics and Gynecology

## 2018-01-11 DIAGNOSIS — R1033 Periumbilical pain: Secondary | ICD-10-CM

## 2018-01-15 ENCOUNTER — Ambulatory Visit (HOSPITAL_COMMUNITY)
Admission: RE | Admit: 2018-01-15 | Discharge: 2018-01-15 | Disposition: A | Discharge status: Home / Self Care | Payer: BLUE CROSS/BLUE SHIELD | Source: Ambulatory Visit | Attending: Obstetrics and Gynecology | Admitting: Obstetrics and Gynecology

## 2018-01-15 DIAGNOSIS — R1033 Periumbilical pain: Secondary | ICD-10-CM | POA: Diagnosis present

## 2018-02-09 ENCOUNTER — Emergency Department (HOSPITAL_COMMUNITY): Payer: BLUE CROSS/BLUE SHIELD

## 2018-02-09 ENCOUNTER — Other Ambulatory Visit: Payer: Self-pay

## 2018-02-09 ENCOUNTER — Emergency Department (HOSPITAL_COMMUNITY)
Admission: EM | Admit: 2018-02-09 | Discharge: 2018-02-09 | Disposition: A | Discharge status: Home / Self Care | Payer: BLUE CROSS/BLUE SHIELD | Attending: Emergency Medicine | Admitting: Emergency Medicine

## 2018-02-09 DIAGNOSIS — W108XXA Fall (on) (from) other stairs and steps, initial encounter: Secondary | ICD-10-CM | POA: Insufficient documentation

## 2018-02-09 DIAGNOSIS — Z79899 Other long term (current) drug therapy: Secondary | ICD-10-CM | POA: Diagnosis not present

## 2018-02-09 DIAGNOSIS — E119 Type 2 diabetes mellitus without complications: Secondary | ICD-10-CM | POA: Insufficient documentation

## 2018-02-09 DIAGNOSIS — Z794 Long term (current) use of insulin: Secondary | ICD-10-CM | POA: Insufficient documentation

## 2018-02-09 DIAGNOSIS — M79672 Pain in left foot: Secondary | ICD-10-CM | POA: Diagnosis present

## 2018-02-09 MED ORDER — OXYCODONE-ACETAMINOPHEN 5-325 MG PO TABS
1.0000 | ORAL_TABLET | Freq: Once | ORAL | Status: AC
  Administered 2018-02-09: 1 via ORAL
  Filled 2018-02-09: qty 1

## 2018-02-09 NOTE — ED Provider Notes (Signed)
[place] DEPT Provider Note   CSN: [id number] Arrival date & time: 02/09/18  1859     History   Chief Complaint Chief Complaint  Patient presents with  . Ankle Pain    HPI Karen Stein is a 47 y.o. female.  HPI  Karen Stein is a 47yo female with a history of chronic pain, fibromyalgia, anxiety, type 2 diabetes who presents to the emergency department for evaluation of left ankle and foot injury.  Patient reports that she accidentally slipped while walking down steps 5 days ago and the bottom of her left foot went under her.  She denies hitting her head or loss of consciousness.  She has been having a severe lateral left ankle pain as well as pain over the dorsum of her foot ever since.  She describes pain is constant and throbbing.  Has been taking Percocet at home without relief.  She reports pain is worse when she bears weight on the foot and with palpation.  She denies numbness, weakness, joint swelling, break in skin, arthralgias elsewhere.  She is able to ambulate independently despite pain.  Past Medical History:  Diagnosis Date  . Anemia   . Anxiety   . Arthritis   . Chronic pain   . Complication of anesthesia    anesthesia awareness,  . DDD (degenerative disc disease)   . DDD (degenerative disc disease)   . Degenerative disc disease   . Degenerative disc disease   . Diabetes mellitus    Type 2 insulin resistant  . Dysrhythmia    stress and caffeine related  . Endometriosis   . Fibroids   . Fibromyalgia   . GERD (gastroesophageal reflux disease)   . High cholesterol   . Insomnia   . Leg cramps   . PCOS (polycystic ovarian syndrome)   . Scoliosis   . Sickle cell trait [facility])     Patient Active Problem List   Diagnosis Date Noted  . Major depressive disorder, recurrent severe without psychotic features ([place]) 10/12/2015  . Degenerative disc disease   . Sickle cell trait ([place])   . DDD (degenerative disc disease)      Past Surgical History:  Procedure Laterality Date  . BACK SURGERY  2010  . BIOPSY BREAST    . CESAREAN SECTION    . DILATATION & CURETTAGE/HYSTEROSCOPY WITH MYOSURE N/A 01/01/2015   Procedure: DILATATION & CURETTAGE/HYSTEROSCOPY WITH MYOSURE;  Surgeon: Crawford Givens, MD;  Location: [facility];  Service: Gynecology;  Laterality: N/A;  . LAPAROSCOPY  2002 or 2003  . TOE SURGERY  2008  . WISDOM TOOTH EXTRACTION       OB History    Gravida  4   Para  2   Term  1   Preterm  1   AB  2   Living  1     SAB  2   TAB      Ectopic      Multiple      Live Births               Home Medications    Prior to Admission medications   Medication Sig Start Date End Date Taking? Authorizing Provider  buPROPion (WELLBUTRIN XL) 300 MG 24 hr tablet Take 1 tablet (300 mg total) by mouth daily. 10/06/17   [name], MD  diazepam (VALIUM) 5 MG tablet Take 1 tablet (5 mg total) by mouth every 12 (twelve) hours as needed for muscle spasms. Patient not  taking: Reported on 10/06/2017 09/27/17   Little, Wenda Overland, MD  glimepiride (AMARYL) 2 MG tablet Take 2 mg by mouth daily with breakfast.    [provider]  ibuprofen (ADVIL,MOTRIN) 800 MG tablet Take 1 tablet (800 mg total) by mouth 3 (three) times daily. 10/31/16   Palumbo, April, MD  Insulin Degludec (TRESIBA) 100 UNIT/ML SOLN Inject into the skin.    [provider]  NABUMETONE PO Take by mouth.    [provider]  pioglitazone-glimepiride (DUETACT) 30-4 MG tablet TK 1 T PO QD 06/18/17   [provider]  Pregabalin (LYRICA PO) Take 150 mg by mouth 2 (two) times daily.     [provider]  valACYclovir (VALTREX) 500 MG tablet Take 1 tablet (500 mg total) by mouth daily. 10/18/15   Kerrie Buffalo, NP  gabapentin (NEURONTIN) 100 MG capsule Take 2 capsules (200 mg total) by mouth 3 (three) times daily. Patient not taking: Reported on 06/27/2016 10/18/15 08/04/17  Kerrie Buffalo, NP    Family  History Family History  Problem Relation Age of Onset  . Diabetes Father   . Hypertension Father   . Hyperlipidemia Father   . Kidney failure Father   . Diabetes Paternal Grandfather   . Hypertension Paternal Grandfather   . Hyperlipidemia Paternal Grandfather   . Diabetes Paternal Grandmother   . Hypertension Paternal Grandmother   . Hyperlipidemia Paternal Grandmother   . Alzheimer's disease Maternal Grandmother   . Brain cancer Maternal Grandfather     Social History Social History   Tobacco Use  . Smoking status: Never Smoker  . Smokeless tobacco: Never Used  Substance Use Topics  . Alcohol use: No    Alcohol/week: 0.0 standard drinks  . Drug use: No     Allergies   Macrobid [nitrofurantoin macrocrystal]; Shellfish allergy; and Sulfa antibiotics   Review of Systems Review of Systems  Musculoskeletal: Positive for arthralgias (left ankle) and gait problem (painful but able to ambulate independently). Negative for joint swelling.  Skin: Negative for color change and wound.  Neurological: Negative for weakness and numbness.     Physical Exam Updated Vital Signs BP (!) 146/90 (BP Location: Left Arm)   Pulse 98   Temp 98.1 F (36.7 C) (Oral)   Resp 18   Ht 5\' 8"  (1.727 m)   Wt 117.9 kg   LMP 11/13/2015 (Exact Date)   SpO2 100%   BMI 39.53 kg/m   Physical Exam  Constitutional: She is oriented to person, place, and time. She appears well-developed and well-nourished. No distress.  HENT:  Head: Normocephalic and atraumatic.  Eyes: Right eye exhibits no discharge. Left eye exhibits no discharge.  Pulmonary/Chest: Effort normal. No respiratory distress.  Musculoskeletal:  Left ankle and foot without swelling, ecchymosis, obvious deformity, erythema or warmth.  Full active ankle range of motion.  Diffuse tenderness over the medial malleolus, lateral left malleolus and over the entire dorsum of the foot.  Achilles intact.  No left calf swelling or tenderness.   Left knee without swelling, erythema, ecchymosis or deformity.  Full left knee range of motion without pain.  PT pulses 2+ and symmetric bilaterally.  Distal sensation to light touch intact in bilateral lower extremities.  Neurological: She is alert and oriented to person, place, and time. Coordination normal.  Skin: Skin is warm and dry. Capillary refill takes less than 2 seconds. She is not diaphoretic.  Psychiatric: She has a normal mood and affect. Her behavior is normal.  Nursing note  and vitals reviewed.   ED Treatments / Results  Labs (all labs ordered are listed, but only abnormal results are displayed) Labs Reviewed - No data to display  EKG None  Radiology Dg Ankle Complete Left  Result Date: 02/09/2018 CLINICAL DATA:  LEFT lateral ankle pain after tripping injury 5 days ago. EXAM: LEFT ANKLE COMPLETE - 3+ VIEW COMPARISON:  None. FINDINGS: Osseous alignment is normal. Ankle mortise is symmetric. No fracture line or displaced fracture fragment seen. Visualized portions of the hindfoot and midfoot appear intact and normally aligned. Adjacent soft tissues are unremarkable. IMPRESSION: Negative. Electronically Signed   By: Franki Cabot M.D.   On: 02/09/2018 19:55    Procedures Procedures (including critical care time)  Medications Ordered in ED Medications  oxyCODONE-acetaminophen (PERCOCET/ROXICET) 5-325 MG per tablet 1 tablet (1 tablet Oral Given 02/09/18 2034)     Initial Impression / Assessment and Plan / ED Course  I have reviewed the triage vital signs and the nursing notes.  Pertinent labs & imaging results that were available during my care of the patient were reviewed by me and considered in my medical decision making (see chart for details).     X-ray left foot and left ankle negative for acute fracture or abnormality.  Foot is neurovascularly intact on exam.  No signs of infection.  Patient seen in the hallways ambulating independently without difficulty.  Plan  to discharge with crutches given her pain and RICE protocol.  I was informed by RN Bluford Main that patient said that she was going to leave because she was in just as much pain as when she came in despite receiving percocet. I saw patient and she states that she is ready for discharge home. Counseled her on return precautions and she agrees.   Final Clinical Impressions(s) / ED Diagnoses   Final diagnoses:  Left foot pain    ED Discharge Orders    None       Bernarda Caffey 02/09/18 2157    Orlie Dakin, MD 02/10/18 0005

## 2018-02-09 NOTE — ED Triage Notes (Signed)
Patient arrives with left ankle pain. Patient fell Monday and hurt left ankl. Patient stated since she has had increasing pain. PMS intact. Patient able to ambulate.

## 2018-02-09 NOTE — ED Notes (Signed)
Patient transported to X-ray 

## 2018-02-09 NOTE — ED Notes (Signed)
Patient left prior to receiving d/c paperwork. 

## 2018-02-09 NOTE — ED Notes (Signed)
Patient ambulatory to nurses station with crutches stating "I am just going to leave. I am in just as much pain as when I came and I would rather be at home." PA notified.

## 2018-04-03 ENCOUNTER — Other Ambulatory Visit: Payer: Self-pay | Admitting: Obstetrics and Gynecology

## 2018-04-03 DIAGNOSIS — N6310 Unspecified lump in the right breast, unspecified quadrant: Secondary | ICD-10-CM

## 2018-04-09 ENCOUNTER — Ambulatory Visit: Admission: RE | Admit: 2018-04-09 | Payer: Self-pay | Source: Ambulatory Visit

## 2018-04-09 ENCOUNTER — Ambulatory Visit
Admission: RE | Admit: 2018-04-09 | Discharge: 2018-04-09 | Disposition: A | Discharge status: Home / Self Care | Payer: BLUE CROSS/BLUE SHIELD | Source: Ambulatory Visit | Attending: Obstetrics and Gynecology | Admitting: Obstetrics and Gynecology

## 2018-04-09 DIAGNOSIS — N6310 Unspecified lump in the right breast, unspecified quadrant: Secondary | ICD-10-CM

## 2018-04-17 ENCOUNTER — Encounter (HOSPITAL_BASED_OUTPATIENT_CLINIC_OR_DEPARTMENT_OTHER): Payer: Self-pay | Admitting: *Deleted

## 2018-04-17 ENCOUNTER — Emergency Department (HOSPITAL_BASED_OUTPATIENT_CLINIC_OR_DEPARTMENT_OTHER)
Admission: EM | Admit: 2018-04-17 | Discharge: 2018-04-17 | Disposition: A | Discharge status: Home / Self Care | Payer: BLUE CROSS/BLUE SHIELD | Attending: Emergency Medicine | Admitting: Emergency Medicine

## 2018-04-17 ENCOUNTER — Other Ambulatory Visit: Payer: Self-pay

## 2018-04-17 DIAGNOSIS — Z79899 Other long term (current) drug therapy: Secondary | ICD-10-CM | POA: Insufficient documentation

## 2018-04-17 DIAGNOSIS — R358 Other polyuria: Secondary | ICD-10-CM | POA: Diagnosis present

## 2018-04-17 DIAGNOSIS — R739 Hyperglycemia, unspecified: Secondary | ICD-10-CM

## 2018-04-17 DIAGNOSIS — E1165 Type 2 diabetes mellitus with hyperglycemia: Secondary | ICD-10-CM | POA: Insufficient documentation

## 2018-04-17 LAB — URINALYSIS, MICROSCOPIC (REFLEX)

## 2018-04-17 LAB — COMPREHENSIVE METABOLIC PANEL
ALT: 18 U/L (ref 0–44)
AST: 25 U/L (ref 15–41)
Albumin: 3.8 g/dL (ref 3.5–5.0)
Alkaline Phosphatase: 116 U/L (ref 38–126)
Anion gap: 11 (ref 5–15)
BUN: 8 mg/dL (ref 6–20)
CHLORIDE: 92 mmol/L — AB (ref 98–111)
CO2: 22 mmol/L (ref 22–32)
CREATININE: 0.94 mg/dL (ref 0.44–1.00)
Calcium: 8.6 mg/dL — ABNORMAL LOW (ref 8.9–10.3)
Glucose, Bld: 626 mg/dL (ref 70–99)
Potassium: 4 mmol/L (ref 3.5–5.1)
SODIUM: 125 mmol/L — AB (ref 135–145)
Total Bilirubin: 0.6 mg/dL (ref 0.3–1.2)
Total Protein: 7.8 g/dL (ref 6.5–8.1)

## 2018-04-17 LAB — CBC WITH DIFFERENTIAL/PLATELET
ABS IMMATURE GRANULOCYTES: 0.02 10*3/uL (ref 0.00–0.07)
BASOS PCT: 1 %
Basophils Absolute: 0.1 10*3/uL (ref 0.0–0.1)
EOS ABS: 0.1 10*3/uL (ref 0.0–0.5)
Eosinophils Relative: 1 %
HCT: 42.5 % (ref 36.0–46.0)
Hemoglobin: 13.9 g/dL (ref 12.0–15.0)
IMMATURE GRANULOCYTES: 0 %
Lymphocytes Relative: 28 %
Lymphs Abs: 2 10*3/uL (ref 0.7–4.0)
MCH: 28.6 pg (ref 26.0–34.0)
MCHC: 32.7 g/dL (ref 30.0–36.0)
MCV: 87.4 fL (ref 80.0–100.0)
Monocytes Absolute: 0.4 10*3/uL (ref 0.1–1.0)
Monocytes Relative: 6 %
NEUTROS ABS: 4.7 10*3/uL (ref 1.7–7.7)
NEUTROS PCT: 64 %
Platelets: 323 10*3/uL (ref 150–400)
RBC: 4.86 MIL/uL (ref 3.87–5.11)
RDW: 12.9 % (ref 11.5–15.5)
WBC: 7.3 10*3/uL (ref 4.0–10.5)
nRBC: 0 % (ref 0.0–0.2)

## 2018-04-17 LAB — URINALYSIS, ROUTINE W REFLEX MICROSCOPIC
Bilirubin Urine: NEGATIVE
Glucose, UA: >=500 mg/dL — AB
Ketones, ur: NEGATIVE mg/dL
LEUKOCYTES UA: NEGATIVE
NITRITE: NEGATIVE
PH: 5.5 (ref 5.0–8.0)
Protein, ur: NEGATIVE mg/dL

## 2018-04-17 LAB — CBG MONITORING, ED
GLUCOSE-CAPILLARY: 497 mg/dL — AB (ref 70–99)
GLUCOSE-CAPILLARY: 348 mg/dL — AB (ref 70–99)

## 2018-04-17 MED ORDER — SODIUM CHLORIDE 0.9 % IV BOLUS
2000.0000 mL | Freq: Once | INTRAVENOUS | Status: AC
  Administered 2018-04-17: 2000 mL via INTRAVENOUS

## 2018-04-17 MED ORDER — INSULIN REGULAR HUMAN 100 UNIT/ML IJ SOLN
10.0000 [IU] | Freq: Once | INTRAMUSCULAR | Status: AC
  Administered 2018-04-17: 10 [IU] via INTRAVENOUS
  Filled 2018-04-17: qty 1

## 2018-04-17 MED ORDER — SODIUM CHLORIDE 0.9 % IV BOLUS
1000.0000 mL | Freq: Once | INTRAVENOUS | Status: AC
  Administered 2018-04-17: 1000 mL via INTRAVENOUS

## 2018-04-17 MED ORDER — OXYCODONE-ACETAMINOPHEN 5-325 MG PO TABS
2.0000 | ORAL_TABLET | Freq: Once | ORAL | Status: AC
  Administered 2018-04-17: 2 via ORAL
  Filled 2018-04-17: qty 2

## 2018-04-17 NOTE — ED Notes (Signed)
Date and time results received: 04/17/18 1632 (use smartphrase ".now" to insert current time)  Test: Glucose Critical Value: *626  Name of Provider Notified: Dr. Theora Gianotti  Orders Received? Or Actions Taken?:

## 2018-04-17 NOTE — ED Triage Notes (Signed)
Pt c/o increased thirst and voiding x 2 weeks, CBG in triage " high "

## 2018-04-17 NOTE — ED Notes (Signed)
Date and time results received: 04/17/18 1749 (use smartphrase ".now" to insert current time)  Test: CBG 348  Name of Provider Notified:Dr. Plunket  Orders Received? Or Actions Taken?:

## 2018-04-17 NOTE — ED Notes (Signed)
Date and time results received: 04/17/18 1701 (use smartphrase ".now" to insert current time)  Test: CBG Critical Value: 497  Name of Provider Notified: Dr. Theora Gianotti  Orders Received? Or Actions Taken?: new orders noted

## 2018-04-17 NOTE — ED Provider Notes (Signed)
[facility] EMERGENCY DEPARTMENT Provider Note   CSN: [id number] Arrival date & time: 04/17/18  1500     History   Chief Complaint Chief Complaint  Patient presents with  . Hyperglycemia    HPI Karen Stein is a 47 y.o. female.  Patient is a pleasant 47 year old female with a history of fibromyalgia, diabetes, chronic pain, anemia presenting today with 2 weeks of worsening polyuria, polydipsia, generalized fatigue and weakness.  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  Because of that she has started sporadically taking her diabetic medications so that she will have them after she loses her insurance.  She states her meter to check her blood sugar has been broken for a while so she has not been able to check but over the last week has started to feel worse and worse and was concerned her sugar was too high.  She has not discussed this with her doctor to see if there are some other options for cheaper diabetic medications.  She denies vomiting but has occasional nausea, intermittent abdominal pain which she states feels like her fibromyalgia, no fever, cough, shortness of breath or chest pain.  No visual changes, headaches, unilateral numbness or weakness.  The history is provided by the patient.  Hyperglycemia    Past Medical History:  Diagnosis Date  . Anemia   . Anxiety   . Arthritis   . Chronic pain   . Complication of anesthesia    anesthesia awareness,  . DDD (degenerative disc disease)   . DDD (degenerative disc disease)   . Degenerative disc disease   . Degenerative disc disease   . Diabetes mellitus    Type 2 insulin resistant  . Dysrhythmia    stress and caffeine related  . Endometriosis   . Fibroids   . Fibromyalgia   . GERD (gastroesophageal reflux disease)   . High cholesterol   . Insomnia   . Leg cramps   . PCOS (polycystic ovarian syndrome)   . Scoliosis   .  Sickle cell trait [facility])     Patient Active Problem List   Diagnosis Date Noted  . Major depressive disorder, recurrent severe without psychotic features ([place]) 10/12/2015  . Degenerative disc disease   . Sickle cell trait ([place])   . DDD (degenerative disc disease)     Past Surgical History:  Procedure Laterality Date  . BACK SURGERY  2010  . BIOPSY BREAST    . CESAREAN SECTION    . DILATATION & CURETTAGE/HYSTEROSCOPY WITH MYOSURE N/A 01/01/2015   Procedure: DILATATION & CURETTAGE/HYSTEROSCOPY WITH MYOSURE;  Surgeon: Crawford Givens, MD;  Location: [facility];  Service: Gynecology;  Laterality: N/A;  . LAPAROSCOPY  2002 or 2003  . TOE SURGERY  2008  . WISDOM TOOTH EXTRACTION       OB History    Gravida  4   Para  2   Term  1   Preterm  1   AB  2   Living  1     SAB  2   TAB      Ectopic      Multiple      Live Births               Home Medications    Prior to Admission medications   Medication Sig Start Date End Date Taking? Authorizing Provider  buPROPion [facility]  XL) 300 MG 24 hr tablet Take 1 tablet (300 mg total) by mouth daily. 10/06/17   [name], MD  diazepam (VALIUM) 5 MG tablet Take 1 tablet (5 mg total) by mouth every 12 (twelve) hours as needed for muscle spasms. Patient not taking: Reported on 10/06/2017 09/27/17   Little, Wenda Overland, MD  glimepiride (AMARYL) 2 MG tablet Take 2 mg by mouth daily with breakfast.    [provider]  ibuprofen (ADVIL,MOTRIN) 800 MG tablet Take 1 tablet (800 mg total) by mouth 3 (three) times daily. 10/31/16   Palumbo, April, MD  Insulin Degludec (TRESIBA) 100 UNIT/ML SOLN Inject into the skin.    [provider]  NABUMETONE PO Take by mouth.    [provider]  pioglitazone-glimepiride (DUETACT) 30-4 MG tablet TK 1 T PO QD 06/18/17   [provider]  Pregabalin (LYRICA PO) Take 150 mg by mouth 2 (two) times daily.     [provider]  valACYclovir (VALTREX) 500 MG tablet  Take 1 tablet (500 mg total) by mouth daily. 10/18/15   Kerrie Buffalo, NP  gabapentin (NEURONTIN) 100 MG capsule Take 2 capsules (200 mg total) by mouth 3 (three) times daily. Patient not taking: Reported on 06/27/2016 10/18/15 08/04/17  Kerrie Buffalo, NP    Family History Family History  Problem Relation Age of Onset  . Diabetes Father   . Hypertension Father   . Hyperlipidemia Father   . Kidney failure Father   . Diabetes Paternal Grandfather   . Hypertension Paternal Grandfather   . Hyperlipidemia Paternal Grandfather   . Diabetes Paternal Grandmother   . Hypertension Paternal Grandmother   . Hyperlipidemia Paternal Grandmother   . Alzheimer's disease Maternal Grandmother   . Brain cancer Maternal Grandfather     Social History Social History   Tobacco Use  . Smoking status: Never Smoker  . Smokeless tobacco: Never Used  Substance Use Topics  . Alcohol use: No    Alcohol/week: 0.0 standard drinks  . Drug use: No     Allergies   Macrobid [nitrofurantoin macrocrystal]; Shellfish allergy; and Sulfa antibiotics   Review of Systems Review of Systems  All other systems reviewed and are negative.    Physical Exam Updated Vital Signs BP 132/84 (BP Location: Left Arm)   Pulse (!) 106   Temp 97.7 F (36.5 C) (Oral)   Resp 20   Ht 5\' 8"  (1.727 m)   Wt 112.5 kg   LMP 11/13/2015 (Exact Date)   SpO2 100%   BMI 37.71 kg/m   Physical Exam Vitals signs and nursing note reviewed.  Constitutional:      General: She is not in acute distress.    Appearance: She is well-developed.  HENT:     Head: Normocephalic and atraumatic.     Mouth/Throat:     Mouth: Mucous membranes are dry.  Eyes:     Pupils: Pupils are equal, round, and reactive to light.  Cardiovascular:     Rate and Rhythm: Regular rhythm. Tachycardia present.     Heart sounds: Normal heart sounds. No murmur. No friction rub.  Pulmonary:     Effort: Pulmonary effort is normal.     Breath sounds: Normal  breath sounds. No wheezing or rales.  Abdominal:     General: Bowel sounds are normal. There is no distension.     Palpations: Abdomen is soft.     Tenderness: There is abdominal tenderness. There is no guarding or rebound.  Comments: Mild diffuse tenderness  Musculoskeletal: Normal range of motion.        General: No tenderness.     Comments: No edema  Skin:    General: Skin is warm and dry.     Findings: No rash.  Neurological:     Mental Status: She is alert and oriented to person, place, and time.     Cranial Nerves: No cranial nerve deficit.  Psychiatric:        Behavior: Behavior normal.      ED Treatments / Results  Labs (all labs ordered are listed, but only abnormal results are displayed) Labs Reviewed  COMPREHENSIVE METABOLIC PANEL - Abnormal; Notable for the following components:      Result Value   Sodium 125 (*)    Chloride 92 (*)    Glucose, Bld 626 (*)    Calcium 8.6 (*)    All other components within normal limits  URINALYSIS, ROUTINE W REFLEX MICROSCOPIC - Abnormal; Notable for the following components:   Specific Gravity, Urine <1.005 (*)    Glucose, UA >=500 (*)    Hgb urine dipstick TRACE (*)    All other components within normal limits  URINALYSIS, MICROSCOPIC (REFLEX) - Abnormal; Notable for the following components:   Bacteria, UA MANY (*)    All other components within normal limits  CBG MONITORING, ED - Abnormal; Notable for the following components:   Glucose-Capillary >600 (*)    All other components within normal limits  CBG MONITORING, ED - Abnormal; Notable for the following components:   Glucose-Capillary 497 (*)    All other components within normal limits  CBG MONITORING, ED - Abnormal; Notable for the following components:   Glucose-Capillary 348 (*)    All other components within normal limits  CBC WITH DIFFERENTIAL/PLATELET    EKG None  Radiology No results found.  Procedures Procedures (including critical care  time)  Medications Ordered in ED Medications  sodium chloride 0.9 % bolus 2,000 mL (has no administration in time range)     Initial Impression / Assessment and Plan / ED Course  I have reviewed the triage vital signs and the nursing notes.  Pertinent labs & imaging results that were available during my care of the patient were reviewed by me and considered in my medical decision making (see chart for details).     Patient presenting today with symptoms of polyuria, polydipsia and blood sugar reading greater than 600.  This is in the setting of her taking her diabetic medications sporadically as to make them last once she loses her medical insurance.  No history of hypertension.  Patient is supposed to be on 3 different diabetic medications.  Symptoms of been going for the last 2 weeks.  She denies symptoms that are classic for DKA and does not smell of ketones.  She is mildly tachycardic but has a normal blood pressure and oxygen saturation.  We will start by giving 2 L of IV fluids.  CBC, CMP are pending.  Will attempt to contact her PCP so that they can obtain a diabetic regiment that she will be able to afford once she loses her insurance.  5:18 PM Labs show CMP with blood sugar of 629 but normal anion gap and no signs of DKA.  After 2L pt blood sugar 497.  Pt given a 3rd liter of fluids and 10units of insulin.  Called [employer] and left a message but nobody called me back.  5:53 PM After 3rd liter  and insulin pt BS in the 300's.  She if feeling better.  Will have her call pcp tomorrow for medication adjustment.  Final Clinical Impressions(s) / ED Diagnoses   Final diagnoses:  Hyperglycemia    ED Discharge Orders    None       Blanchie Dessert, MD 04/17/18 1755

## 2018-04-17 NOTE — ED Notes (Signed)
ED Provider at bedside. 

## 2018-04-17 NOTE — ED Notes (Signed)
Date and time results received: 04/17/18 4:29 PM  Test: Glucose Critical Value: 626  Name of Provider Notified: Plunkett  Orders Received? Or Actions Taken?: EDP notified.

## 2018-04-18 MED FILL — Insulin Regular (Human) Inj 100 Unit/ML: INTRAMUSCULAR | Qty: 0.1 | Status: AC

## 2018-04-22 ENCOUNTER — Other Ambulatory Visit: Payer: Self-pay | Admitting: Anesthesiology

## 2018-04-29 ENCOUNTER — Other Ambulatory Visit: Payer: Self-pay | Admitting: Anesthesiology

## 2018-04-29 DIAGNOSIS — M545 Low back pain, unspecified: Secondary | ICD-10-CM

## 2018-05-07 ENCOUNTER — Other Ambulatory Visit: Payer: BLUE CROSS/BLUE SHIELD

## 2018-06-22 ENCOUNTER — Ambulatory Visit
Admission: RE | Admit: 2018-06-22 | Discharge: 2018-06-22 | Disposition: A | Discharge status: Home / Self Care | Payer: BLUE CROSS/BLUE SHIELD | Source: Ambulatory Visit | Attending: Anesthesiology | Admitting: Anesthesiology

## 2018-06-22 DIAGNOSIS — M545 Low back pain, unspecified: Secondary | ICD-10-CM

## 2018-06-23 ENCOUNTER — Ambulatory Visit
Admission: RE | Admit: 2018-06-23 | Discharge: 2018-06-23 | Disposition: A | Discharge status: Home / Self Care | Payer: BLUE CROSS/BLUE SHIELD | Source: Ambulatory Visit | Attending: Anesthesiology | Admitting: Anesthesiology

## 2018-07-31 ENCOUNTER — Emergency Department (HOSPITAL_COMMUNITY): Payer: BLUE CROSS/BLUE SHIELD

## 2018-07-31 ENCOUNTER — Other Ambulatory Visit: Payer: Self-pay

## 2018-07-31 ENCOUNTER — Observation Stay (HOSPITAL_COMMUNITY)
Admission: EM | Admit: 2018-07-31 | Discharge: 2018-08-01 | Disposition: A | Discharge status: Home / Self Care | Payer: BLUE CROSS/BLUE SHIELD | Attending: Emergency Medicine | Admitting: Emergency Medicine

## 2018-07-31 ENCOUNTER — Observation Stay (HOSPITAL_COMMUNITY): Payer: BLUE CROSS/BLUE SHIELD

## 2018-07-31 ENCOUNTER — Encounter (HOSPITAL_COMMUNITY): Payer: Self-pay

## 2018-07-31 DIAGNOSIS — E119 Type 2 diabetes mellitus without complications: Secondary | ICD-10-CM | POA: Insufficient documentation

## 2018-07-31 DIAGNOSIS — R0789 Other chest pain: Principal | ICD-10-CM | POA: Insufficient documentation

## 2018-07-31 DIAGNOSIS — R197 Diarrhea, unspecified: Secondary | ICD-10-CM | POA: Insufficient documentation

## 2018-07-31 DIAGNOSIS — F332 Major depressive disorder, recurrent severe without psychotic features: Secondary | ICD-10-CM | POA: Diagnosis not present

## 2018-07-31 DIAGNOSIS — Z794 Long term (current) use of insulin: Secondary | ICD-10-CM

## 2018-07-31 DIAGNOSIS — R779 Abnormality of plasma protein, unspecified: Secondary | ICD-10-CM | POA: Diagnosis not present

## 2018-07-31 DIAGNOSIS — I4589 Other specified conduction disorders: Secondary | ICD-10-CM | POA: Insufficient documentation

## 2018-07-31 DIAGNOSIS — R079 Chest pain, unspecified: Secondary | ICD-10-CM | POA: Diagnosis not present

## 2018-07-31 DIAGNOSIS — F419 Anxiety disorder, unspecified: Secondary | ICD-10-CM | POA: Diagnosis not present

## 2018-07-31 DIAGNOSIS — R112 Nausea with vomiting, unspecified: Secondary | ICD-10-CM | POA: Diagnosis not present

## 2018-07-31 DIAGNOSIS — E8809 Other disorders of plasma-protein metabolism, not elsewhere classified: Secondary | ICD-10-CM | POA: Diagnosis not present

## 2018-07-31 DIAGNOSIS — R9431 Abnormal electrocardiogram [ECG] [EKG]: Secondary | ICD-10-CM | POA: Diagnosis present

## 2018-07-31 DIAGNOSIS — R1084 Generalized abdominal pain: Secondary | ICD-10-CM

## 2018-07-31 DIAGNOSIS — R1013 Epigastric pain: Secondary | ICD-10-CM | POA: Diagnosis not present

## 2018-07-31 DIAGNOSIS — M5136 Other intervertebral disc degeneration, lumbar region: Secondary | ICD-10-CM | POA: Diagnosis present

## 2018-07-31 DIAGNOSIS — E782 Mixed hyperlipidemia: Secondary | ICD-10-CM | POA: Diagnosis present

## 2018-07-31 DIAGNOSIS — R109 Unspecified abdominal pain: Secondary | ICD-10-CM | POA: Diagnosis present

## 2018-07-31 DIAGNOSIS — E785 Hyperlipidemia, unspecified: Secondary | ICD-10-CM

## 2018-07-31 DIAGNOSIS — Z6838 Body mass index (BMI) 38.0-38.9, adult: Secondary | ICD-10-CM

## 2018-07-31 LAB — TROPONIN I
Troponin I: <0.03 ng/mL (ref <0.03)
Troponin I: <0.03 ng/mL (ref <0.03)
Troponin I: <0.03 ng/mL (ref <0.03)
Troponin I: <0.03 ng/mL (ref <0.03)
Troponin I: <0.03 ng/mL (ref <0.03)

## 2018-07-31 LAB — COMPREHENSIVE METABOLIC PANEL
ALT: 18 U/L (ref 0–44)
AST: 23 U/L (ref 15–41)
Albumin: 3.6 g/dL (ref 3.5–5.0)
Alkaline Phosphatase: 107 U/L (ref 38–126)
Anion gap: 13 (ref 5–15)
BUN: <5 mg/dL — ABNORMAL LOW (ref 6–20)
CO2: 22 mmol/L (ref 22–32)
Calcium: 8.8 mg/dL — ABNORMAL LOW (ref 8.9–10.3)
Chloride: 99 mmol/L (ref 98–111)
Creatinine, Ser: 0.76 mg/dL (ref 0.44–1.00)
Glucose, Bld: 372 mg/dL — ABNORMAL HIGH (ref 70–99)
Potassium: 3.6 mmol/L (ref 3.5–5.1)
Sodium: 134 mmol/L — ABNORMAL LOW (ref 135–145)
Total Bilirubin: 0.6 mg/dL (ref 0.3–1.2)
Total Protein: 7.8 g/dL (ref 6.5–8.1)

## 2018-07-31 LAB — URINALYSIS, ROUTINE W REFLEX MICROSCOPIC
Bilirubin Urine: NEGATIVE
Glucose, UA: >=500 mg/dL — AB
Hgb urine dipstick: NEGATIVE
Ketones, ur: 5 mg/dL — AB
Leukocytes,Ua: NEGATIVE
Nitrite: NEGATIVE
Protein, ur: NEGATIVE mg/dL
Specific Gravity, Urine: 1.012 (ref 1.005–1.030)
pH: 9 — ABNORMAL HIGH (ref 5.0–8.0)

## 2018-07-31 LAB — GLUCOSE, CAPILLARY
Glucose-Capillary: 364 mg/dL — ABNORMAL HIGH (ref 70–99)
Glucose-Capillary: 244 mg/dL — ABNORMAL HIGH (ref 70–99)
Glucose-Capillary: 352 mg/dL — ABNORMAL HIGH (ref 70–99)
Glucose-Capillary: 317 mg/dL — ABNORMAL HIGH (ref 70–99)

## 2018-07-31 LAB — RAPID URINE DRUG SCREEN, HOSP PERFORMED
Amphetamines: NOT DETECTED
Barbiturates: NOT DETECTED
Benzodiazepines: NOT DETECTED
Cocaine: NOT DETECTED
Opiates: NOT DETECTED
Tetrahydrocannabinol: NOT DETECTED

## 2018-07-31 LAB — CBG MONITORING, ED: Glucose-Capillary: 345 mg/dL — ABNORMAL HIGH (ref 70–99)

## 2018-07-31 LAB — MAGNESIUM: Magnesium: 1.6 mg/dL — ABNORMAL LOW (ref 1.7–2.4)

## 2018-07-31 LAB — CBC
HCT: 41 % (ref 36.0–46.0)
Hemoglobin: 14.2 g/dL (ref 12.0–15.0)
MCH: 29.4 pg (ref 26.0–34.0)
MCHC: 34.6 g/dL (ref 30.0–36.0)
MCV: 84.9 fL (ref 80.0–100.0)
Platelets: 355 10*3/uL (ref 150–400)
RBC: 4.83 MIL/uL (ref 3.87–5.11)
RDW: 11.9 % (ref 11.5–15.5)
WBC: 8.1 10*3/uL (ref 4.0–10.5)
nRBC: 0 % (ref 0.0–0.2)

## 2018-07-31 LAB — LIPASE, BLOOD: Lipase: 27 U/L (ref 11–51)

## 2018-07-31 LAB — HIV ANTIBODY (ROUTINE TESTING W REFLEX): HIV Screen 4th Generation wRfx: NONREACTIVE

## 2018-07-31 MED ORDER — HYDROXYZINE HCL 10 MG PO TABS
10.0000 mg | ORAL_TABLET | Freq: Three times a day (TID) | ORAL | Status: DC | PRN
  Filled 2018-07-31: qty 1

## 2018-07-31 MED ORDER — HYDROMORPHONE HCL 1 MG/ML IJ SOLN
1.0000 mg | Freq: Once | INTRAMUSCULAR | Status: AC
  Administered 2018-07-31: 01:00:00 1 mg via INTRAVENOUS
  Filled 2018-07-31: qty 1

## 2018-07-31 MED ORDER — ASPIRIN 81 MG PO CHEW
324.0000 mg | CHEWABLE_TABLET | Freq: Once | ORAL | Status: AC
  Administered 2018-07-31: 02:00:00 324 mg via ORAL
  Filled 2018-07-31: qty 4

## 2018-07-31 MED ORDER — MORPHINE SULFATE (PF) 2 MG/ML IV SOLN: 2.0000 mg | INTRAVENOUS | Status: DC | PRN

## 2018-07-31 MED ORDER — METOCLOPRAMIDE HCL 5 MG/ML IJ SOLN
5.0000 mg | Freq: Two times a day (BID) | INTRAMUSCULAR | Status: AC
  Administered 2018-07-31: 5 mg via INTRAVENOUS
  Filled 2018-07-31 (×3): qty 2

## 2018-07-31 MED ORDER — PREGABALIN 75 MG PO CAPS
150.0000 mg | ORAL_CAPSULE | Freq: Two times a day (BID) | ORAL | Status: DC
  Administered 2018-07-31 (×2): 150 mg via ORAL
  Filled 2018-07-31 (×2): qty 2

## 2018-07-31 MED ORDER — IBUPROFEN 800 MG PO TABS: 800.0000 mg | ORAL_TABLET | Freq: Three times a day (TID) | ORAL | Status: DC

## 2018-07-31 MED ORDER — CARVEDILOL 12.5 MG PO TABS: 6.2500 mg | ORAL_TABLET | Freq: Two times a day (BID) | ORAL | Status: DC

## 2018-07-31 MED ORDER — POTASSIUM CHLORIDE CRYS ER 20 MEQ PO TBCR
40.0000 meq | EXTENDED_RELEASE_TABLET | ORAL | Status: AC
  Administered 2018-07-31 (×2): 40 meq via ORAL
  Filled 2018-07-31 (×2): qty 2

## 2018-07-31 MED ORDER — ACETAMINOPHEN 325 MG PO TABS: 650.0000 mg | ORAL_TABLET | ORAL | Status: DC | PRN

## 2018-07-31 MED ORDER — IOHEXOL 300 MG/ML  SOLN
100.0000 mL | Freq: Once | INTRAMUSCULAR | Status: AC | PRN
  Administered 2018-07-31: 100 mL via INTRAVENOUS

## 2018-07-31 MED ORDER — LABETALOL HCL 5 MG/ML IV SOLN: 5.0000 mg | Freq: Once | INTRAVENOUS | Status: DC

## 2018-07-31 MED ORDER — IOHEXOL 350 MG/ML SOLN
100.0000 mL | Freq: Once | INTRAVENOUS | Status: AC | PRN
  Administered 2018-07-31: 07:00:00 100 mL via INTRAVENOUS

## 2018-07-31 MED ORDER — OXYCODONE-ACETAMINOPHEN 5-325 MG PO TABS
1.0000 | ORAL_TABLET | ORAL | Status: DC | PRN
  Administered 2018-08-01 (×2): 1 via ORAL
  Filled 2018-07-31 (×3): qty 1

## 2018-07-31 MED ORDER — SODIUM CHLORIDE 0.9 % IV BOLUS
1000.0000 mL | Freq: Once | INTRAVENOUS | Status: AC
  Administered 2018-07-31: 1000 mL via INTRAVENOUS

## 2018-07-31 MED ORDER — BUPROPION HCL ER (XL) 150 MG PO TB24
300.0000 mg | ORAL_TABLET | Freq: Every day | ORAL | Status: DC
  Administered 2018-07-31 – 2018-08-01 (×2): 300 mg via ORAL
  Filled 2018-07-31 (×2): qty 2

## 2018-07-31 MED ORDER — INSULIN ASPART 100 UNIT/ML ~~LOC~~ SOLN
0.0000 [IU] | Freq: Three times a day (TID) | SUBCUTANEOUS | Status: DC
  Administered 2018-07-31 (×2): 9 [IU] via SUBCUTANEOUS
  Administered 2018-07-31: 14:00:00 3 [IU] via SUBCUTANEOUS

## 2018-07-31 MED ORDER — BOOST / RESOURCE BREEZE PO LIQD CUSTOM
1.0000 | Freq: Three times a day (TID) | ORAL | Status: DC
  Administered 2018-07-31: 14:00:00 1 via ORAL

## 2018-07-31 MED ORDER — DIAZEPAM 5 MG PO TABS
5.0000 mg | ORAL_TABLET | Freq: Two times a day (BID) | ORAL | Status: DC | PRN
  Administered 2018-08-01: 10:00:00 5 mg via ORAL
  Filled 2018-07-31: qty 1

## 2018-07-31 MED ORDER — METOPROLOL TARTRATE 12.5 MG HALF TABLET: 12.5000 mg | ORAL_TABLET | Freq: Two times a day (BID) | ORAL | Status: DC

## 2018-07-31 MED ORDER — LORAZEPAM 2 MG/ML IJ SOLN
0.5000 mg | Freq: Once | INTRAMUSCULAR | Status: AC
  Administered 2018-07-31: 0.5 mg via INTRAVENOUS
  Filled 2018-07-31: qty 1

## 2018-07-31 MED ORDER — NITROGLYCERIN 2 % TD OINT
0.5000 [in_us] | TOPICAL_OINTMENT | Freq: Once | TRANSDERMAL | Status: AC
  Administered 2018-07-31: 06:00:00 0.5 [in_us] via TOPICAL
  Filled 2018-07-31: qty 1

## 2018-07-31 MED ORDER — PROMETHAZINE HCL 25 MG/ML IJ SOLN: 25.0000 mg | Freq: Once | INTRAMUSCULAR | Status: DC

## 2018-07-31 MED ORDER — LORAZEPAM 2 MG/ML IJ SOLN
1.0000 mg | Freq: Once | INTRAMUSCULAR | Status: AC
  Administered 2018-07-31: 1 mg via INTRAVENOUS
  Filled 2018-07-31: qty 1

## 2018-07-31 MED ORDER — SODIUM CHLORIDE 0.9 % IV SOLN
INTRAVENOUS | Status: DC
  Administered 2018-07-31: 10:00:00 via INTRAVENOUS

## 2018-07-31 MED ORDER — NITROGLYCERIN 0.4 MG SL SUBL: 0.4000 mg | SUBLINGUAL_TABLET | SUBLINGUAL | Status: DC | PRN

## 2018-07-31 MED ORDER — MAGNESIUM SULFATE 2 GM/50ML IV SOLN
2.0000 g | Freq: Once | INTRAVENOUS | Status: AC
  Administered 2018-07-31: 2 g via INTRAVENOUS
  Filled 2018-07-31: qty 50

## 2018-07-31 MED ORDER — MORPHINE SULFATE (PF) 4 MG/ML IV SOLN
4.0000 mg | Freq: Once | INTRAVENOUS | Status: AC
  Administered 2018-07-31: 06:00:00 4 mg via INTRAVENOUS
  Filled 2018-07-31: qty 1

## 2018-07-31 MED ORDER — MORPHINE SULFATE (PF) 2 MG/ML IV SOLN: 1.0000 mg | INTRAVENOUS | Status: DC | PRN

## 2018-07-31 MED ORDER — HYDROMORPHONE HCL 1 MG/ML IJ SOLN
1.0000 mg | Freq: Once | INTRAMUSCULAR | Status: AC
  Administered 2018-07-31: 04:00:00 1 mg via INTRAVENOUS
  Filled 2018-07-31: qty 1

## 2018-07-31 MED ORDER — METOPROLOL TARTRATE 25 MG PO TABS
25.0000 mg | ORAL_TABLET | Freq: Two times a day (BID) | ORAL | Status: DC
  Administered 2018-07-31 – 2018-08-01 (×3): 25 mg via ORAL
  Filled 2018-07-31 (×3): qty 1

## 2018-07-31 MED ORDER — LORAZEPAM 2 MG/ML IJ SOLN
INTRAMUSCULAR | Status: AC
  Filled 2018-07-31: qty 1

## 2018-07-31 NOTE — ED Triage Notes (Addendum)
Pt ate flounder tonight at 8pm and has had increasing N&V and diarrhea. abd pain 10/10. Received 4mg  zofran en route

## 2018-07-31 NOTE — ED Notes (Signed)
Patient transported to CT 

## 2018-07-31 NOTE — Consult Note (Signed)
Cardiology Consultation:   Patient ID: Karen Stein; [id number]; 05-28-1970   Admit date: 07/31/2018 Date of Consult: 07/31/2018  Primary Care Provider: [name], MD Primary Cardiologist: New to [facility]   Patient Profile:   Karen Stein is a 48 y.o. female with a hx of diabetes mellitus, hyperlipidemia, GERD, depression, anxiety, fibromyalgia, degenerative disc disease, sickle cell trait and obesity who presented to [facility] with chest pain, nausea, vomiting, diarrhea, abdominal pain who is now being seen for the evaluation of chest pain at the request of Dr. Blaine Hamper.  History of Present Illness:   Karen Stein is a 48yo F with a hx as stated above who presented to [facility] on 07/30/2018 with abdominal and associated nausea, vomiting, and diarrhea that began yesterday afternoon. She reports that around 1630 pm she was trying several methods to relieve her opioid induced constipation with prune juice when she had one episode of sharp chest pain that radiated to her mid back with no associated symptoms including SOB or diarrhea. She tried to eat some dinner around 630 pm and then starting vomiting multiple times. She reports no recurrent episodes of chest pain since that time. She called EMS and was taken to the ED for further evaluation. She has not had fever, cough or chills. She has no prior hx of CAD. Family hx with heart disease of unknown origin in her father and mother. She does not smoke cigarettes and does not drink alcohol and no illicit drug use.    In the ED, EKG with diffuse T wave changes however seem to be resolved on subsequent tracings. Troponin has been negative x3 at <0.03, <0.03, <0.03. CXR with no acute cardiopulmonary disease. Abdominal CT negative for appendicitis or other acute finding. CT angiogram with no acute changes and no mention of atherosclerosis. WBC within normal range. She was hypertensive on presentation at 189/82. Low grade temp at 100.1.   Cardiology has been  asked to evaluate given the above.   Past Medical History:  Diagnosis Date  . Anemia   . Anxiety   . Arthritis   . Chronic pain   . Complication of anesthesia    anesthesia awareness,  . DDD (degenerative disc disease)   . DDD (degenerative disc disease)   . Degenerative disc disease   . Degenerative disc disease   . Diabetes mellitus    Type 2 insulin resistant  . Dysrhythmia    stress and caffeine related  . Endometriosis   . Fibroids   . Fibromyalgia   . GERD (gastroesophageal reflux disease)   . High cholesterol   . Insomnia   . Leg cramps   . PCOS (polycystic ovarian syndrome)   . Scoliosis   . Sickle cell trait [facility])     Past Surgical History:  Procedure Laterality Date  . BACK SURGERY  2010  . BIOPSY BREAST    . CESAREAN SECTION    . DILATATION & CURETTAGE/HYSTEROSCOPY WITH MYOSURE N/A 01/01/2015   Procedure: DILATATION & CURETTAGE/HYSTEROSCOPY WITH MYOSURE;  Surgeon: Crawford Givens, MD;  Location: [facility];  Service: Gynecology;  Laterality: N/A;  . LAPAROSCOPY  2002 or 2003  . TOE SURGERY  2008  . WISDOM TOOTH EXTRACTION       Prior to Admission medications   Medication Sig Start Date End Date Taking? Authorizing Provider  buPROPion (WELLBUTRIN XL) 300 MG 24 hr tablet Take 1 tablet (300 mg total) by mouth daily. 10/06/17   [name], MD  diazepam (VALIUM) 5  MG tablet Take 1 tablet (5 mg total) by mouth every 12 (twelve) hours as needed for muscle spasms. Patient not taking: Reported on 10/06/2017 09/27/17   Little, Wenda Overland, MD  glimepiride (AMARYL) 2 MG tablet Take 2 mg by mouth daily with breakfast.    [provider]  ibuprofen (ADVIL,MOTRIN) 800 MG tablet Take 1 tablet (800 mg total) by mouth 3 (three) times daily. 10/31/16   Palumbo, April, MD  Insulin Degludec (TRESIBA) 100 UNIT/ML SOLN Inject into the skin.    [provider]  NABUMETONE PO Take by mouth.    [provider]  pioglitazone-glimepiride (DUETACT) 30-4 MG tablet TK  1 T PO QD 06/18/17   [provider]  Pregabalin (LYRICA PO) Take 150 mg by mouth 2 (two) times daily.     [provider]  valACYclovir (VALTREX) 500 MG tablet Take 1 tablet (500 mg total) by mouth daily. 10/18/15   Kerrie Buffalo, NP  gabapentin (NEURONTIN) 100 MG capsule Take 2 capsules (200 mg total) by mouth 3 (three) times daily. Patient not taking: Reported on 06/27/2016 10/18/15 08/04/17  Kerrie Buffalo, NP    Inpatient Medications: Scheduled Meds: . insulin aspart  0-9 Units Subcutaneous TID WC  . labetalol  5 mg Intravenous Once  . pregabalin  150 mg Oral BID   Continuous Infusions: . sodium chloride     PRN Meds: acetaminophen, hydrOXYzine, morphine injection, nitroGLYCERIN, oxyCODONE-acetaminophen  Allergies:    Allergies  Allergen Reactions  . Macrobid [Nitrofurantoin Macrocrystal] Nausea Only    Headache and "severe abd cramps"  . Shellfish Allergy Swelling    Itching of lips and ears, NOT allergic to iodine contrast  . Sulfa Antibiotics Other (See Comments)    Dizzy and sees spots with sulfa drugs    Social History:   Social History   Socioeconomic History  . Marital status: Married    Spouse name: Not on file  . Number of children: Not on file  . Years of education: Not on file  . Highest education level: Not on file  Occupational History  . Not on file  Social Needs  . Financial resource strain: Not on file  . Food insecurity:    Worry: Not on file    Inability: Not on file  . Transportation needs:    Medical: Not on file    Non-medical: Not on file  Tobacco Use  . Smoking status: Never Smoker  . Smokeless tobacco: Never Used  Substance and Sexual Activity  . Alcohol use: No    Alcohol/week: 0.0 standard drinks  . Drug use: No  . Sexual activity: Not on file  Lifestyle  . Physical activity:    Days per week: Not on file    Minutes per session: Not on file  . Stress: Not on file  Relationships  . Social connections:     Talks on phone: Not on file    Gets together: Not on file    Attends religious service: Not on file    Active member of club or organization: Not on file    Attends meetings of clubs or organizations: Not on file    Relationship status: Not on file  . Intimate partner violence:    Fear of current or ex partner: Not on file    Emotionally abused: Not on file    Physically abused: Not on file    Forced sexual activity: Not on file  Other Topics Concern  . Not on file  Social History Narrative  . Not on file    Family History:   Family History  Problem Relation Age of Onset  . Diabetes Father   . Hypertension Father   . Hyperlipidemia Father   . Kidney failure Father   . Diabetes Paternal Grandfather   . Hypertension Paternal Grandfather   . Hyperlipidemia Paternal Grandfather   . Diabetes Paternal Grandmother   . Hypertension Paternal Grandmother   . Hyperlipidemia Paternal Grandmother   . Alzheimer's disease Maternal Grandmother   . Brain cancer Maternal Grandfather    Family Status:  Family Status  Relation Name Status  . Father  Alive  . Mother  Alive  . PGF  (Not Specified)  . PGM  (Not Specified)  . MGM  (Not Specified)  . MGF  (Not Specified)    ROS:  Please see the history of present illness.  All other ROS reviewed and negative.     Physical Exam/Data:   Vitals:   07/31/18 0445 07/31/18 0515 07/31/18 0600 07/31/18 0615  BP: (!) 175/87 (!) 180/93 128/82 122/83  Pulse: 88     Resp: 20 15 17 19   Temp:      TempSrc:      SpO2: 98%     Weight:      Height:       No intake or output data in the 24 hours ending 07/31/18 0836 Filed Weights   07/31/18 0022  Weight: 108.9 kg   Body mass index is 36.49 kg/m.   Obese General: Well developed, well nourished, NAD Skin: Warm, dry, intact  Head: Normocephalic, atraumatic, clear, moist mucus membranes. Neck: Negative for carotid bruits. No JVD Lungs:Clear to ausculation bilaterally. No wheezes, rales, or  rhonchi. Breathing is unlabored. Cardiovascular: RRR with S1 S2. No murmurs, rubs, gallops, or LV heave appreciated. Abdomen: Soft, non-tender, non-distended with normoactive bowel sounds. No obvious abdominal masses. MSK: Strength and tone appear normal for age. 5/5 in all extremities Extremities: No edema. No clubbing or cyanosis. DP/PT pulses 2+ bilaterally Neuro: Alert and oriented. No focal deficits. No facial asymmetry. MAE spontaneously. Psych: Responds to questions appropriately with normal affect.    EKG:  The EKG was personally reviewed and demonstrates:  07/31/2018 NSR with diffuse T wave inversions  Telemetry:  Telemetry was personally reviewed and demonstrates: NSR  Relevant CV Studies:  ECHO: None   CATH: None   Laboratory Data:  Chemistry Recent Labs  Lab 07/31/18 0029  NA 134*  K 3.6  CL 99  CO2 22  GLUCOSE 372*  BUN <5*  CREATININE 0.76  CALCIUM 8.8*  GFRNONAA NOT CALCULATED  GFRAA NOT CALCULATED  ANIONGAP 13    Total Protein  Date Value Ref Range Status  07/31/2018 7.8 6.5 - 8.1 g/dL Final   Albumin  Date Value Ref Range Status  07/31/2018 3.6 3.5 - 5.0 g/dL Final   AST  Date Value Ref Range Status  07/31/2018 23 15 - 41 U/L Final   ALT  Date Value Ref Range Status  07/31/2018 18 0 - 44 U/L Final   Alkaline Phosphatase  Date Value Ref Range Status  07/31/2018 107 38 - 126 U/L Final   Total Bilirubin  Date Value Ref Range Status  07/31/2018 0.6 0.3 - 1.2 mg/dL Final   Hematology Recent Labs  Lab 07/31/18 0029  WBC 8.1  RBC 4.83  HGB 14.2  HCT 41.0  MCV 84.9  MCH 29.4  MCHC 34.6  RDW 11.9  PLT 355  Cardiac Enzymes Recent Labs  Lab 07/31/18 0029 07/31/18 0430 07/31/18 0618  TROPONINI <0.03 <0.03 <0.03   No results for input(s): TROPIPOC in the last 168 hours.  BNPNo results for input(s): BNP, PROBNP in the last 168 hours.  DDimer No results for input(s): DDIMER in the last 168 hours. TSH:  Lab Results  Component  Value Date   TSH 0.676 07/22/2012   Lipids: Lab Results  Component Value Date   CHOL (H) 04/14/2010    209        ATP III CLASSIFICATION:  <200     mg/dL   Desirable  200-239  mg/dL   Borderline High  >=240    mg/dL   High          HDL 35 (L) 04/14/2010   LDLCALC (H) 04/14/2010    149        Total Cholesterol/HDL:CHD Risk Coronary Heart Disease Risk Table                     Men   Women  1/2 Average Risk   3.4   3.3  Average Risk       5.0   4.4  2 X Average Risk   9.6   7.1  3 X Average Risk  23.4   11.0        Use the calculated Patient Ratio above and the CHD Risk Table to determine the patient's CHD Risk.        ATP III CLASSIFICATION (LDL):  <100     mg/dL   Optimal  100-129  mg/dL   Near or Above                    Optimal  130-159  mg/dL   Borderline  160-189  mg/dL   High  >190     mg/dL   Very High   TRIG 123 04/14/2010   CHOLHDL 6.0 04/14/2010   HgbA1c: Lab Results  Component Value Date   HGBA1C (H) 04/14/2010    10.9 (NOTE)                                                                       According to the ADA Clinical Practice Recommendations for 2011, when HbA1c is used as a screening test:   >=6.5%   Diagnostic of Diabetes Mellitus           (if abnormal result  is confirmed)  5.7-6.4%   Increased risk of developing Diabetes Mellitus  References:Diagnosis and Classification of Diabetes Mellitus,Diabetes HUDJ,4970,26(VZCHY 1):S62-S69 and Standards of Medical Care in         Diabetes - 2011,Diabetes Care,2011,34  (Suppl 1):S11-S61.    Radiology/Studies:  Ct Abdomen Pelvis W Contrast  Result Date: 07/31/2018 CLINICAL DATA:  Abdominal pain with nausea vomiting and diarrhea beginning last night. EXAM: CT ABDOMEN AND PELVIS WITH CONTRAST TECHNIQUE: Multidetector CT imaging of the abdomen and pelvis was performed using the standard protocol following bolus administration of intravenous contrast. CONTRAST:  155mL OMNIPAQUE IV COMPARISON:  05/21/2013  FINDINGS: Lower chest:  No contributory findings. Hepatobiliary: No focal liver abnormality.No evidence of biliary obstruction or stone. Pancreas: Unremarkable. Spleen: Unremarkable. Adrenals/Urinary Tract: Negative adrenals. No hydronephrosis or  stone. Unremarkable bladder. Stomach/Bowel:  No obstruction. No appendicitis. Vascular/Lymphatic: No acute vascular abnormality. No mass or adenopathy. Reproductive:Negative. Other: No ascites or pneumoperitoneum. Musculoskeletal: No acute abnormalities. Mild scoliosis with disc and facet degeneration in the lumbar spine, as seen on MRI 06/23/2018. IMPRESSION: Negative for appendicitis or other acute finding. Electronically Signed   By: Monte Fantasia M.D.   On: 07/31/2018 04:13   Dg Chest Portable 1 View  Result Date: 07/31/2018 CLINICAL DATA:  48 year old female with chest pain and vomiting. EXAM: PORTABLE CHEST 1 VIEW COMPARISON:  Chest CT dated 10/31/2016 FINDINGS: The lungs are clear. There is no pleural effusion or pneumothorax. Borderline cardiomegaly. No acute osseous pathology. IMPRESSION: No active disease. Electronically Signed   By: Anner Crete M.D.   On: 07/31/2018 01:25   Ct Angio Chest Aorta W/cm &/or Wo/cm  Result Date: 07/31/2018 CLINICAL DATA:  Chest pain EXAM: CT ANGIOGRAPHY CHEST WITH CONTRAST TECHNIQUE: Multidetector CT imaging of the chest was performed using the standard protocol during bolus administration of intravenous contrast. Multiplanar CT image reconstructions and MIPs were obtained to evaluate the vascular anatomy. CONTRAST:  160mL OMNIPAQUE IOHEXOL 350 MG/ML SOLN COMPARISON:  10/31/2016 FINDINGS: Cardiovascular: Heart is borderline in size. Aorta is normal caliber. No dissection. No filling defects in the pulmonary arteries to suggest pulmonary emboli. Mediastinum/Nodes: No mediastinal, hilar, or axillary adenopathy. Lungs/Pleura: Lungs are clear. No focal airspace opacities or suspicious nodules. Stable right middle lobe  scarring. No effusions. Upper Abdomen: Imaging into the upper abdomen shows no acute findings. Musculoskeletal: Chest wall soft tissues are unremarkable.No acute bony abnormality. Review of the MIP images confirms the above findings. IMPRESSION: No evidence of aortic aneurysm or dissection. No evidence of pulmonary embolus. Borderline heart size.  Right middle lobe scarring. No acute cardiopulmonary disease. Electronically Signed   By: Rolm Baptise M.D.   On: 07/31/2018 07:32   Assessment and Plan:   1. Atypical chest pain: -Pt presented with abdominal and associated nausea, vomiting, and diarrhea that began on day of presentation. She reportd that around 1630 pm she was trying several methods to relieve her opioid induced constipation with prune juice when she had one episode of sharp chest pain that radiated to her mid back with no associated symptoms including SOB or diarrhea. This dissipated on its own with no recurrence. Her biggest complaint is abdominal pain at this point.  -EKG with diffuse T wave inversions  -Troponin negative x3, <0.03, <0.03, <0.03 -CXR, CTA with no acute changes>>>no mention of aortic or coronary calcifications  -No recurrent chest pain since that time -Has significant risk factors however given her current abdominal pain with N/V/D>>>low suspicion on for ACS at this point. Would  -Continue ASA -Will increase metoprolol to 25mg  BID given persistently elevated BP -Will obtain lipid panel and more current HbA1c for risk stratification   2. DM2: -Last HbA1c, 10.9 -SSI for glucose control while inpatient status -Per primary team   3. HTN: -Uncontrolled, 173/87>122/83>180/93>175/87 -Will increase metoprolol to 25mg  BID  4. Abdominal pain: -Pt presented with abdominal pain with associated N/V/D -Abdominal CT with no acute changes -Would recommend further    For questions or updates, please contact [place] Please consult [email or web address] for contact info under  Cardiology/STEMI.   SignedKathyrn Drown NP-C [facility] Pager: [phone] 07/31/2018 8:36 AM   I have seen and examined the patient along with Kathyrn Drown NP-C.  I have reviewed the chart, notes and new data.  I agree with PA/NP's note.  Key new  complaints: symptoms are abdominal rather than cardiac Key examination changes: obese, elevated BP, no other CV abnormalities Key new findings / data: nonspecific changes on ECG and normal troponin, no CV abnormalities on chest CT, no calcified plaque seen in the coronaries or thoracic aorta and its branches  PLAN: Low suspicion for acute coronary sd. No additional workup is planned at this time. Focus on improved BP and glycemic control. Start statin to reach LDL<100.  Will arrange follow up visit as an outpatient.  Sanda Klein, MD, [place] [phone] 07/31/2018, 10:42 AM

## 2018-07-31 NOTE — ED Notes (Signed)
pts daughter, Consuello Closs, would like an update when possible at [phone]

## 2018-07-31 NOTE — Progress Notes (Signed)
Patient admitted aftermidnight with abdominal pain, chest pain, persistent nausea/vomiting and diarrhea. This am complains only of nausea and wanting something to eat. Denies chest pain no further episodes of diarrhea since admission. One episode of emesis since admission. trops neg ekg with prolonged QT.   Chest pain/abdominal pain: Etiology is not clear. No further chest pain. Complains of abdominal pain this am.  Heart score 3-4. Troponin negatie x3. ekg with QT prolongation and non-specific t wave abnormalities.  CT chest without PE or dissection. CT abdomen without acute abnormality. No events on tele.  pt received one dose of ASA. A1c greater than 10. lipid panel pending. inpt card consult was requested via Epic. Have been informed that no stress tests/echos done due to covid. Query mild gastroparesis in setting of uncontrolled diabetes and chronic pain.  -supportive therapy -clear liquids as tolerated -bowel rest -reglan  -improved diabetes control -consult diabetes coordinator   Elevated blood pressure: no hx of HTN.  Likely has undiagnosed hypertension, but abdominal pain and chest pain may have contributed partially. Given 1 dose of labetalol 5 mg. -will start low dose bb given mild tachycardia -if no improvement consider ace inhibitor or CCB  DDD (degenerative disc disease), lumbar -prn percoct and lyrica  Major depressive disorder, recurrent severe without psychotic features ([place]) and Anxiety: -hold Wellbutrin due to QTC prolongation  HLD (hyperlipidemia): not taking meds -f/u FLP  Prolonged QT interval: -prn ativan was given  Nausea vomiting and diarrhea and abdominal pain: Etiology is not clear.  Differential diagnosis including food poisoning and viral gastroenteritis, gastroparesis.  No sick contact.  No recent traveling. No cough or SOB. Low suspicions for COVID19. No further emesis or diarrhea but continues with nausea. Of note, gave patient sips of sprite and  immediately complained nausea. Few minutes later after I told her plan was bowel rest. She then denied nausea and requested jello -Supportive care - IV fluid: 2 L normal saline, then 100 cc/h -PRN hydroxyzine for nausea vomiting -PRN morphine for abdominal pain -advance diet as tolerated  Diabetes mellitus without complication: Last L7L 89.2 on 04/14/18, poorly controled. Patient is taking Amaryl, pioglitazone, Tresiba at home. Will need to decrease Tresiba dosage -clear liquids for now -SSI for optimal control  Santiago Glad m. , NP

## 2018-07-31 NOTE — ED Notes (Signed)
Have pt call wendy horton as soon as possible, and she would like an update when possible at [phone]

## 2018-07-31 NOTE — ED Notes (Addendum)
Dr Blaine Hamper at bedside. Patient now reports chest pain that she has had since her n/v/d began yesterday

## 2018-07-31 NOTE — Progress Notes (Addendum)
Inpatient Diabetes Program Recommendations  AACE/ADA: New Consensus Statement on Inpatient Glycemic Control (2015)  Target Ranges:  Prepandial:   less than 140 mg/dL      Peak postprandial:   less than 180 mg/dL (1-2 hours)      Critically ill patients:  140 - 180 mg/dL    Review of Glycemic Control  Diabetes history: DM 2 Outpatient Diabetes medications: Glimepiride 2 mg Daily, Pioglitazone (ran out 1 month ago), [place] (unknown dose, not taking consistently ran out 2 months ago), Semaglutide 0.5 mg weekly on Mondays  Current orders for Inpatient glycemic control: Novolog 0-9 units tid  Inpatient Diabetes Program Recommendations:    Spoke with patient over the phone. Patient was slow to respond at times. Patient reports seeing Raelyn Number at [facility] for DM management. She had an appointment today (4/1), but will reschedule.   Patient checks glucose 2 x/week. Pt does not like to see her glucose levels 200 or more that is why she does not check more often.  Pt heard "diet drinks were bad for you" so she gets regular soda "but does not drink as much."   Discussed the need for diet, zero, light beverages instead of regular. Discussed glucose checks 1-2 x/day and to call physician every few days if glucose remains elevated > 200 mg/dl. Discussed the need for follow up with her PCP and possibly get referral for Endocrinologist.  Saw patient was on Semaglutide (GLP-1)outpatient. Patient prescribed .25 mg for 3-4 weeks and then increase dosage to .5 mg weekly. Patient repots taking .25 for 2 weeks and then increasing to .5 mg last Monday (patient missed this past Monday's dose). Noticed some side effects of Semaglutide are similar to what patient presented with. Spoke with Dyanne Carrel, NP about medication.  Would recommend not continuing Semaglutide at time of d/c and have PCP reevaluate if patient needs to be on medication.  If inpatient trends continue to be elevated after  correction scale, consider Lantus 10 and increasing correction scale to Novolog 0-15 units tid +0-5 units qhs scale.  Thanks, Tama Headings RN, MSN, BC-ADM Inpatient Diabetes Coordinator Team Pager [phone] (8a-5p)

## 2018-07-31 NOTE — H&P (Addendum)
History and Physical    SUNNI RICHARDSON ALP:[id number] DOB: [birth year] DOA: 07/31/2018  Referring MD/NP/PA:   PCP: [name], MD   Patient coming from:  The patient is coming from home.  At baseline, pt is independent for most of ADL.        Chief Complaint: chest pain, nausea, vomiting, diarrhea and abdominal pain  HPI: Karen Stein is a 48 y.o. female with medical history significant of diabetes mellitus, hyperlipidemia, GERD, depression, anxiety, fibromyalgia, degenerative disc disease, sickle cell trait, obesity, who presents with chest pain, nausea, vomiting, diarrhea, abdominal pain.  Patient states that her chest pain started about 16:30 yesterday. It located in the central chest, sharp, 10 out of 10 in severity, radiating to the upper back between shoulder blades.  No shortness of breath, cough, fever or chills.  Patient also reports nausea, vomiting, diarrhea and abdominal pain. She states that she takes narcotics for chronic pain, and is often constipated.  She states that she took prune juice for helping her constipation. She reports that she ate flounder for dinner around 6 PM, then developed multiple episodes of nausea, vomiting, diarrhea.  She states that she has had nonbloody, non-biliary vomiting 13 times.  She has had 2 loose stool bowel movements.  She also reports abdominal pain, which is located in central and lower abdomen, constant, 8 out of 10 in severity, sharp, nonradiating.  Patient denies fever or chills.  Initially she had temperature 100.1, then rechecked body temperature was 98.4 without any treatment.  She denies symptoms of UTI or unilateral weakness.  Patient does not have history of hypertension, but her blood pressure is elevated at 189/82 in ED. No sick contact. No recent traveling.   ED Course: pt was found to have negative troponin, WBC 8.1, lipase 27, negative urinalysis, electrolytes renal function okay, heart rate 88, respirations of 20, oxygen  saturation 98% on room air. chest x-ray negative.  CT abdomen/pelvis is negative.  Patient is placed on telemetry bed for observation.  Review of Systems:   General: no subjective fevers, chills, no body weight gain, has poor appetite, has fatigue HEENT: no blurry vision, hearing changes or sore throat Respiratory: no dyspnea, coughing, wheezing CV: has chest pain, no palpitations GI: has nausea, vomiting, abdominal pain, diarrhea, constipation GU: no dysuria, burning on urination, increased urinary frequency, hematuria  Ext: no leg edema Neuro: no unilateral weakness, numbness, or tingling, no vision change or hearing loss Skin: no rash, no skin tear. MSK: No muscle spasm, no deformity, no limitation of range of movement in spin Heme: No easy bruising.  Travel history: No recent long distant travel.  Allergy:  Allergies  Allergen Reactions  . Macrobid [Nitrofurantoin Macrocrystal] Nausea Only    Headache and "severe abd cramps"  . Shellfish Allergy Swelling    Itching of lips and ears, NOT allergic to iodine contrast  . Sulfa Antibiotics Other (See Comments)    Dizzy and sees spots with sulfa drugs    Past Medical History:  Diagnosis Date  . Anemia   . Anxiety   . Arthritis   . Chronic pain   . Complication of anesthesia    anesthesia awareness,  . DDD (degenerative disc disease)   . DDD (degenerative disc disease)   . Degenerative disc disease   . Degenerative disc disease   . Diabetes mellitus    Type 2 insulin resistant  . Dysrhythmia    stress and caffeine related  . Endometriosis   . Fibroids   .  Fibromyalgia   . GERD (gastroesophageal reflux disease)   . High cholesterol   . Insomnia   . Leg cramps   . PCOS (polycystic ovarian syndrome)   . Scoliosis   . Sickle cell trait [facility])     Past Surgical History:  Procedure Laterality Date  . BACK SURGERY  2010  . BIOPSY BREAST    . CESAREAN SECTION    . DILATATION & CURETTAGE/HYSTEROSCOPY WITH MYOSURE N/A  01/01/2015   Procedure: DILATATION & CURETTAGE/HYSTEROSCOPY WITH MYOSURE;  Surgeon: Crawford Givens, MD;  Location: [facility];  Service: Gynecology;  Laterality: N/A;  . LAPAROSCOPY  2002 or 2003  . TOE SURGERY  2008  . WISDOM TOOTH EXTRACTION      Social History:  reports that she has never smoked. She has never used smokeless tobacco. She reports that she does not drink alcohol or use drugs.  Family History:  Family History  Problem Relation Age of Onset  . Diabetes Father   . Hypertension Father   . Hyperlipidemia Father   . Kidney failure Father   . Diabetes Paternal Grandfather   . Hypertension Paternal Grandfather   . Hyperlipidemia Paternal Grandfather   . Diabetes Paternal Grandmother   . Hypertension Paternal Grandmother   . Hyperlipidemia Paternal Grandmother   . Alzheimer's disease Maternal Grandmother   . Brain cancer Maternal Grandfather      Prior to Admission medications   Medication Sig Start Date End Date Taking? Authorizing Provider  buPROPion (WELLBUTRIN XL) 300 MG 24 hr tablet Take 1 tablet (300 mg total) by mouth daily. 10/06/17   [name], MD  diazepam (VALIUM) 5 MG tablet Take 1 tablet (5 mg total) by mouth every 12 (twelve) hours as needed for muscle spasms. Patient not taking: Reported on 10/06/2017 09/27/17   Little, Wenda Overland, MD  glimepiride (AMARYL) 2 MG tablet Take 2 mg by mouth daily with breakfast.    [provider]  ibuprofen (ADVIL,MOTRIN) 800 MG tablet Take 1 tablet (800 mg total) by mouth 3 (three) times daily. 10/31/16   Palumbo, April, MD  Insulin Degludec (TRESIBA) 100 UNIT/ML SOLN Inject into the skin.    [provider]  NABUMETONE PO Take by mouth.    [provider]  pioglitazone-glimepiride (DUETACT) 30-4 MG tablet TK 1 T PO QD 06/18/17   [provider]  Pregabalin (LYRICA PO) Take 150 mg by mouth 2 (two) times daily.     [provider]  valACYclovir (VALTREX) 500 MG tablet Take 1 tablet (500 mg  total) by mouth daily. 10/18/15   Kerrie Buffalo, NP  gabapentin (NEURONTIN) 100 MG capsule Take 2 capsules (200 mg total) by mouth 3 (three) times daily. Patient not taking: Reported on 06/27/2016 10/18/15 08/04/17  Kerrie Buffalo, NP    Physical Exam: Vitals:   07/31/18 0445 07/31/18 0515 07/31/18 0600 07/31/18 0615  BP: (!) 175/87 (!) 180/93 128/82 122/83  Pulse: 88     Resp: 20 15 17 19   Temp:      TempSrc:      SpO2: 98%     Weight:      Height:       General: Not in acute distress HEENT:       Eyes: PERRL, EOMI, no scleral icterus.       ENT: No discharge from the ears and nose, no pharynx injection, no tonsillar enlargement.        Neck: No JVD, no bruit, no mass felt. Heme: No neck lymph  node enlargement. Cardiac: S1/S2, RRR, No murmurs, No gallops or rubs. Respiratory:  No rales, wheezing, rhonchi or rubs. GI: Soft, nondistended, diffused tenderness which is mild, no rebound pain, no organomegaly, BS present. GU: No hematuria Ext: No pitting leg edema bilaterally. 2+DP/PT pulse bilaterally. Musculoskeletal: No joint deformities, No joint redness or warmth, no limitation of ROM in spin. Skin: No rashes.  Neuro: Alert, oriented X3, cranial nerves II-XII grossly intact, moves all extremities normally.  Psych: Patient is not psychotic, no suicidal or hemocidal ideation.  Labs on Admission: I have personally reviewed following labs and imaging studies  CBC: Recent Labs  Lab 07/31/18 0029  WBC 8.1  HGB 14.2  HCT 41.0  MCV 84.9  PLT 400   Basic Metabolic Panel: Recent Labs  Lab 07/31/18 0029  NA 134*  K 3.6  CL 99  CO2 22  GLUCOSE 372*  BUN <5*  CREATININE 0.76  CALCIUM 8.8*   GFR: Estimated Creatinine Clearance: 110 mL/min (by C-G formula based on SCr of 0.76 mg/dL). Liver Function Tests: Recent Labs  Lab 07/31/18 0029  AST 23  ALT 18  ALKPHOS 107  BILITOT 0.6  PROT 7.8  ALBUMIN 3.6   Recent Labs  Lab 07/31/18 0029  LIPASE 27   No results  for input(s): AMMONIA in the last 168 hours. Coagulation Profile: No results for input(s): INR, PROTIME in the last 168 hours. Cardiac Enzymes: Recent Labs  Lab 07/31/18 0029 07/31/18 0430  TROPONINI <0.03 <0.03   BNP (last 3 results) No results for input(s): PROBNP in the last 8760 hours. HbA1C: No results for input(s): HGBA1C in the last 72 hours. CBG: Recent Labs  Lab 07/31/18 0046  GLUCAP 345*   Lipid Profile: No results for input(s): CHOL, HDL, LDLCALC, TRIG, CHOLHDL, LDLDIRECT in the last 72 hours. Thyroid Function Tests: No results for input(s): TSH, T4TOTAL, FREET4, T3FREE, THYROIDAB in the last 72 hours. Anemia Panel: No results for input(s): VITAMINB12, FOLATE, FERRITIN, TIBC, IRON, RETICCTPCT in the last 72 hours. Urine analysis:    Component Value Date/Time   COLORURINE STRAW (A) 07/31/2018 0018   APPEARANCEUR CLEAR 07/31/2018 0018   LABSPEC 1.012 07/31/2018 0018   PHURINE 9.0 (H) 07/31/2018 0018   GLUCOSEU >=500 (A) 07/31/2018 0018   HGBUR NEGATIVE 07/31/2018 0018   BILIRUBINUR NEGATIVE 07/31/2018 0018   KETONESUR 5 (A) 07/31/2018 0018   PROTEINUR NEGATIVE 07/31/2018 0018   UROBILINOGEN 0.2 02/17/2015 1155   NITRITE NEGATIVE 07/31/2018 0018   LEUKOCYTESUR NEGATIVE 07/31/2018 0018   Sepsis Labs: @LABRCNTIP (procalcitonin:4,lacticidven:4) )No results found for this or any previous visit (from the past 240 hour(s)).   Radiological Exams on Admission: Ct Abdomen Pelvis W Contrast  Result Date: 07/31/2018 CLINICAL DATA:  Abdominal pain with nausea vomiting and diarrhea beginning last night. EXAM: CT ABDOMEN AND PELVIS WITH CONTRAST TECHNIQUE: Multidetector CT imaging of the abdomen and pelvis was performed using the standard protocol following bolus administration of intravenous contrast. CONTRAST:  169mL OMNIPAQUE IV COMPARISON:  05/21/2013 FINDINGS: Lower chest:  No contributory findings. Hepatobiliary: No focal liver abnormality.No evidence of biliary  obstruction or stone. Pancreas: Unremarkable. Spleen: Unremarkable. Adrenals/Urinary Tract: Negative adrenals. No hydronephrosis or stone. Unremarkable bladder. Stomach/Bowel:  No obstruction. No appendicitis. Vascular/Lymphatic: No acute vascular abnormality. No mass or adenopathy. Reproductive:Negative. Other: No ascites or pneumoperitoneum. Musculoskeletal: No acute abnormalities. Mild scoliosis with disc and facet degeneration in the lumbar spine, as seen on MRI 06/23/2018. IMPRESSION: Negative for appendicitis or other acute finding. Electronically Signed   By: Angelica Chessman  Watts M.D.   On: 07/31/2018 04:13   Dg Chest Portable 1 View  Result Date: 07/31/2018 CLINICAL DATA:  48 year old female with chest pain and vomiting. EXAM: PORTABLE CHEST 1 VIEW COMPARISON:  Chest CT dated 10/31/2016 FINDINGS: The lungs are clear. There is no pleural effusion or pneumothorax. Borderline cardiomegaly. No acute osseous pathology. IMPRESSION: No active disease. Electronically Signed   By: Anner Crete M.D.   On: 07/31/2018 01:25     EKG: Independently reviewed.  Sinus rhythm, QTC 512, LAE, T wave inversion in the inferior leads and V2-V6.   Assessment/Plan Principal Problem:   Chest pain Active Problems:   DDD (degenerative disc disease), lumbar   Major depressive disorder, recurrent severe without psychotic features (HCC)   Anxiety   HLD (hyperlipidemia)   Prolonged QT interval   Nausea vomiting and diarrhea   Abdominal pain   Elevated blood protein   Diabetes mellitus without complication (HCC)   Chest pain: Etiology is not clear.  Initial troponin negative.  Patient does not have history of hypertension, but her blood pressure is elevated at 189/82.  Since chest pain is radiating to the upper back between shoulder blades, aortic dissection needs to be ruled out.  Initially I ordered CT angiogram of chest/abdomen/pelvis.  Radiologist called me, and told him that since patient had CT abdomen/pelvis  with contrast, which did not show any signs of dissection, therefore test is changed to CT angiogram of the chest only.  - will place on Tele bed for obs - cycle CE q6 x3 and repeat EKG in the am  - prn Nitroglycerin, Morphine - pt received one dose of ASA-->will hold ASA until CTA of chest is done - Risk factor stratification: will check FLP and A1C, UDS - give one dose of IV labetalol 5 mg to control blood pressure - inpt card consult was requested via Epic -f/u CTA of chest to r/o dissection  Elevated blood pressure: no hx of HTN. Bp 189/82-->175/87. Likely has undiagnosed hypertension, but abdominal pain and chest pain may have contributed partially. -Give 1 dose of labetalol 5 mg as above -If patient has persistent hypertension, may need to start oral medications.  DDD (degenerative disc disease), lumbar -prn percoct and lyrica  Major depressive disorder, recurrent severe without psychotic features ([place]) and Anxiety: -hold Wellbutrin due to QTC prolongation  HLD (hyperlipidemia): not taking meds -f/u FLP  Prolonged QT interval: -hold wellbutrin -prn ativan was given  Nausea vomiting and diarrhea and abdominal pain: Etiology is not clear.  Differential diagnosis including food poisoning and viral gastroenteritis.  No sick contact.  No recent traveling. No cough or SOB. Low suspicions for COVID19 -Supportive care - IV fluid: 2 L normal saline, then 100 cc/h -PRN hydroxyzine for nausea vomiting -PRN morphine for abdominal pain  Diabetes mellitus without complication: Last O8C 16.6 on 04/14/18, poorly controled. Patient is taking Amaryl, pioglitazone, Tresiba at home -will need to decrease [place] dosage -SSI    DVT ppx: SCD-->pending CAT to r/o dissection Code Status: Full code Family Communication: None at bed side.   Disposition Plan:  Anticipate discharge back to previous home environment Consults called:  none Admission status: Obs / tele   Date of Service  07/31/2018    [facility]   If 7PM-7AM, please contact night-coverage [email or web address] Password [email or web address] 07/31/2018, 6:49 AM

## 2018-07-31 NOTE — ED Provider Notes (Signed)
[facility] EMERGENCY DEPARTMENT Provider Note   CSN: [id number] Arrival date & time: 07/31/18  0010    History   Chief Complaint Chief Complaint  Patient presents with   Abdominal Pain   Emesis    HPI Karen Stein is a 48 y.o. female with a history of DDD, anesthesia awareness, chronic pain syndrome, chronic opioid use, IDDM Type I, hypercholesteremia, sickle cell trait, anemia, anxiety, endometriosis, and PCOS who presents to the emergency department by EMS with a chief complaint of vomiting.  The patient reports she developed sharp, central chest pain that radiated between her shoulder blades that began at 1600. She reports that she ate flounder for dinner around 18:00 and took some prune juice to help with her chronic constipation then developed multiple episodes of N/VD around 2000 followed by generalized,10/10 epigastric pain. She reports 13 episodes of vomiting prior to arrival and 2 loose BMs.   Denies cough, shortness of breath, palpitations, leg swelling, rash, urinary symptoms, melena, hematochezia, vaginal pain, bleeding, or discharge, or constipation.  No fever at home or chills reports.   She reports that she took her home pain medication with no improvement in her symptoms.  She reports that she has a high pain tolerance that is difficult to manage.  No known sick contacts.  No recent travel.  She reports that her father was diagnosed with CVD.  She denies a history of hypertension.  No alcohol or illicit drug use.     The history is provided by the patient. No language interpreter was used.    Past Medical History:  Diagnosis Date   Anemia    Anxiety    Arthritis    Chronic pain    Complication of anesthesia    anesthesia awareness,   DDD (degenerative disc disease)    DDD (degenerative disc disease)    Degenerative disc disease    Degenerative disc disease    Diabetes mellitus    Type 2 insulin resistant   Dysrhythmia    stress and caffeine related   Endometriosis    Fibroids    Fibromyalgia    GERD (gastroesophageal reflux disease)    High cholesterol    Insomnia    Leg cramps    PCOS (polycystic ovarian syndrome)    Scoliosis    Sickle cell trait ([place])     Patient Active Problem List   Diagnosis Date Noted   HLD (hyperlipidemia) 07/31/2018   Prolonged QT interval 07/31/2018   Chest pain 07/31/2018   Nausea vomiting and diarrhea 07/31/2018   Abdominal pain 07/31/2018   Elevated blood protein 07/31/2018   Diabetes mellitus without complication ([place]) 00/76/2263   Anxiety    Major depressive disorder, recurrent severe without psychotic features ([place]) 10/12/2015   Degenerative disc disease    Sickle cell trait ([place])    DDD (degenerative disc disease), lumbar     Past Surgical History:  Procedure Laterality Date   BACK SURGERY  2010   BIOPSY BREAST     CESAREAN [place] N/A 01/01/2015   Procedure: DILATATION & CURETTAGE/HYSTEROSCOPY WITH MYOSURE;  Surgeon: Crawford Givens, MD;  Location: [facility];  Service: Gynecology;  Laterality: N/A;   LAPAROSCOPY  2002 or 2003   TOE SURGERY  2008   WISDOM TOOTH EXTRACTION       OB History    Gravida  4   Para  2   Term  1   Preterm  1   AB  2   Living  1     SAB  2   TAB      Ectopic      Multiple      Live Births               Home Medications    Prior to Admission medications   Medication Sig Start Date End Date Taking? Authorizing Provider  buPROPion (WELLBUTRIN XL) 300 MG 24 hr tablet Take 1 tablet (300 mg total) by mouth daily. 10/06/17   [name], MD  diazepam (VALIUM) 5 MG tablet Take 1 tablet (5 mg total) by mouth every 12 (twelve) hours as needed for muscle spasms. Patient not taking: Reported on 10/06/2017 09/27/17   Little, Wenda Overland, MD  glimepiride (AMARYL) 2 MG tablet Take 2 mg by mouth daily with breakfast.    [provider]  ibuprofen (ADVIL,MOTRIN) 800 MG tablet Take 1 tablet (800 mg total) by mouth 3 (three) times daily. 10/31/16   Palumbo, April, MD  Insulin Degludec (TRESIBA) 100 UNIT/ML SOLN Inject into the skin.    [provider]  NABUMETONE PO Take by mouth.    [provider]  pioglitazone-glimepiride (DUETACT) 30-4 MG tablet TK 1 T PO QD 06/18/17   [provider]  Pregabalin (LYRICA PO) Take 150 mg by mouth 2 (two) times daily.     [provider]  valACYclovir (VALTREX) 500 MG tablet Take 1 tablet (500 mg total) by mouth daily. 10/18/15   Kerrie Buffalo, NP  gabapentin (NEURONTIN) 100 MG capsule Take 2 capsules (200 mg total) by mouth 3 (three) times daily. Patient not taking: Reported on 06/27/2016 10/18/15 08/04/17  Kerrie Buffalo, NP    Family History Family History  Problem Relation Age of Onset   Diabetes Father    Hypertension Father    Hyperlipidemia Father    Kidney failure Father    Diabetes Paternal Grandfather    Hypertension Paternal Grandfather    Hyperlipidemia Paternal Grandfather    Diabetes Paternal Grandmother    Hypertension Paternal Grandmother    Hyperlipidemia Paternal Grandmother    Alzheimer's disease Maternal Grandmother    Brain cancer Maternal Grandfather     Social History Social History   Tobacco Use   Smoking status: Never Smoker   Smokeless tobacco: Never Used  Substance Use Topics   Alcohol use: No    Alcohol/week: 0.0 standard drinks   Drug use: No     Allergies   Macrobid [nitrofurantoin macrocrystal]; Shellfish allergy; and Sulfa antibiotics   Review of Systems Review of Systems  Constitutional: Negative for activity change, chills and fever.  HENT: Negative for congestion and sore throat.   Respiratory: Negative for shortness of breath.   Cardiovascular: Positive for chest pain. Negative for palpitations and leg swelling.  Gastrointestinal: Positive for abdominal pain,  constipation, diarrhea, nausea and vomiting. Negative for anal bleeding, blood in stool and rectal pain.  Genitourinary: Negative for dysuria, flank pain, genital sores, urgency, vaginal bleeding and vaginal discharge.  Musculoskeletal: Negative for back pain, gait problem, myalgias, neck pain and neck stiffness.  Skin: Negative for rash.  Allergic/Immunologic: Negative for immunocompromised state.  Neurological: Negative for dizziness, weakness, numbness and headaches.  Psychiatric/Behavioral: Negative for confusion.     Physical Exam Updated Vital Signs BP 122/83    Pulse 88    Temp 99.4 F (37.4 C) (Rectal)    Resp 19    Ht 5\' 8"  (1.727 m)  Wt 108.9 kg    LMP 11/13/2015 (Exact Date)    SpO2 98%    BMI 36.49 kg/m   Physical Exam Vitals signs and nursing note reviewed.  Constitutional:      General: She is in acute distress.     Appearance: She is obese. She is not ill-appearing, toxic-appearing or diaphoretic.     Comments: Patient is rolling around on the bed, moaning, and calling out for pain medication. Appears uncomfortable.   HENT:     Head: Normocephalic.     Right Ear: Tympanic membrane, ear canal and external ear normal.     Left Ear: Tympanic membrane, ear canal and external ear normal.     Nose: Nose normal.     Mouth/Throat:     Mouth: Mucous membranes are moist.  Eyes:     Extraocular Movements: Extraocular movements intact.     Conjunctiva/sclera: Conjunctivae normal.     Pupils: Pupils are equal, round, and reactive to light.  Neck:     Musculoskeletal: Normal range of motion and neck supple. No neck rigidity or muscular tenderness.  Cardiovascular:     Rate and Rhythm: Normal rate and regular rhythm.     Pulses: No decreased pulses.          Radial pulses are 2+ on the right side and 2+ on the left side.       Dorsalis pedis pulses are 2+ on the right side and 2+ on the left side.       Posterior tibial pulses are 2+ on the right side and 2+ on the left  side.     Heart sounds: No murmur. No friction rub. No gallop.   Pulmonary:     Effort: Pulmonary effort is normal. No respiratory distress.     Breath sounds: No stridor. No wheezing, rhonchi or rales.  Chest:     Chest wall: No tenderness.  Abdominal:     General: There is no distension.     Palpations: Abdomen is soft. There is no mass.     Tenderness: There is abdominal tenderness. There is no right CVA tenderness, left CVA tenderness, guarding or rebound.     Hernia: No hernia is present.     Comments: Diffusely tender to palpation in the upper abdomen, but maximally tender in the epigastric region.  She has guarding in the epigastric region, but no rebound.  Abdomen is obese and soft, nondistended.  No CVA tenderness bilaterally.  Musculoskeletal:        General: No swelling or tenderness.     Right lower leg: No edema.     Left lower leg: No edema.  Lymphadenopathy:     Cervical: No cervical adenopathy.  Skin:    General: Skin is warm.     Findings: No rash.  Neurological:     Mental Status: She is alert.  Psychiatric:        Behavior: Behavior normal.      ED Treatments / Results  Labs (all labs ordered are listed, but only abnormal results are displayed) Labs Reviewed  COMPREHENSIVE METABOLIC PANEL - Abnormal; Notable for the following components:      Result Value   Sodium 134 (*)    Glucose, Bld 372 (*)    BUN <5 (*)    Calcium 8.8 (*)    All other components within normal limits  URINALYSIS, ROUTINE W REFLEX MICROSCOPIC - Abnormal; Notable for the following components:   Color, Urine STRAW (*)  pH 9.0 (*)    Glucose, UA >=500 (*)    Ketones, ur 5 (*)    Bacteria, UA RARE (*)    All other components within normal limits  GLUCOSE, CAPILLARY - Abnormal; Notable for the following components:   Glucose-Capillary 364 (*)    All other components within normal limits  CBG MONITORING, ED - Abnormal; Notable for the following components:   Glucose-Capillary 345  (*)    All other components within normal limits  GASTROINTESTINAL PANEL BY PCR, STOOL (REPLACES STOOL CULTURE)  CULTURE, BLOOD (ROUTINE X 2)  CULTURE, BLOOD (ROUTINE X 2)  CBC  LIPASE, BLOOD  TROPONIN I  TROPONIN I  TROPONIN I  RAPID URINE DRUG SCREEN, HOSP PERFORMED  TROPONIN I  TROPONIN I  HIV ANTIBODY (ROUTINE TESTING W REFLEX)    EKG EKG Interpretation  Date/Time:  Wednesday July 31 2018 01:06:43 EDT Ventricular Rate:  94 PR Interval:    QRS Duration: 91 QT Interval:  409 QTC Calculation: 512 R Axis:   74 Text Interpretation:  Sinus rhythm Left atrial enlargement Abnormal T, consider ischemia, diffuse leads Prolonged QT interval new diffuse T wave inverions  Confirmed by Ezequiel Essex [phone]) on 07/31/2018 1:18:40 AM   Radiology Ct Abdomen Pelvis W Contrast  Result Date: 07/31/2018 CLINICAL DATA:  Abdominal pain with nausea vomiting and diarrhea beginning last night. EXAM: CT ABDOMEN AND PELVIS WITH CONTRAST TECHNIQUE: Multidetector CT imaging of the abdomen and pelvis was performed using the standard protocol following bolus administration of intravenous contrast. CONTRAST:  135mL OMNIPAQUE IV COMPARISON:  05/21/2013 FINDINGS: Lower chest:  No contributory findings. Hepatobiliary: No focal liver abnormality.No evidence of biliary obstruction or stone. Pancreas: Unremarkable. Spleen: Unremarkable. Adrenals/Urinary Tract: Negative adrenals. No hydronephrosis or stone. Unremarkable bladder. Stomach/Bowel:  No obstruction. No appendicitis. Vascular/Lymphatic: No acute vascular abnormality. No mass or adenopathy. Reproductive:Negative. Other: No ascites or pneumoperitoneum. Musculoskeletal: No acute abnormalities. Mild scoliosis with disc and facet degeneration in the lumbar spine, as seen on MRI 06/23/2018. IMPRESSION: Negative for appendicitis or other acute finding. Electronically Signed   By: Monte Fantasia M.D.   On: 07/31/2018 04:13   Dg Chest Portable 1 View  Result  Date: 07/31/2018 CLINICAL DATA:  48 year old female with chest pain and vomiting. EXAM: PORTABLE CHEST 1 VIEW COMPARISON:  Chest CT dated 10/31/2016 FINDINGS: The lungs are clear. There is no pleural effusion or pneumothorax. Borderline cardiomegaly. No acute osseous pathology. IMPRESSION: No active disease. Electronically Signed   By: Anner Crete M.D.   On: 07/31/2018 01:25   Ct Angio Chest Aorta W/cm &/or Wo/cm  Result Date: 07/31/2018 CLINICAL DATA:  Chest pain EXAM: CT ANGIOGRAPHY CHEST WITH CONTRAST TECHNIQUE: Multidetector CT imaging of the chest was performed using the standard protocol during bolus administration of intravenous contrast. Multiplanar CT image reconstructions and MIPs were obtained to evaluate the vascular anatomy. CONTRAST:  134mL OMNIPAQUE IOHEXOL 350 MG/ML SOLN COMPARISON:  10/31/2016 FINDINGS: Cardiovascular: Heart is borderline in size. Aorta is normal caliber. No dissection. No filling defects in the pulmonary arteries to suggest pulmonary emboli. Mediastinum/Nodes: No mediastinal, hilar, or axillary adenopathy. Lungs/Pleura: Lungs are clear. No focal airspace opacities or suspicious nodules. Stable right middle lobe scarring. No effusions. Upper Abdomen: Imaging into the upper abdomen shows no acute findings. Musculoskeletal: Chest wall soft tissues are unremarkable.No acute bony abnormality. Review of the MIP images confirms the above findings. IMPRESSION: No evidence of aortic aneurysm or dissection. No evidence of pulmonary embolus. Borderline heart size.  Right middle lobe  scarring. No acute cardiopulmonary disease. Electronically Signed   By: Rolm Baptise M.D.   On: 07/31/2018 07:32    Procedures Procedures (including critical care time)  Medications Ordered in ED Medications  labetalol (NORMODYNE,TRANDATE) injection 5 mg (5 mg Intravenous Not Given 07/31/18 0605)  morphine 2 MG/ML injection 2 mg (has no administration in time range)  nitroGLYCERIN (NITROSTAT) SL  tablet 0.4 mg (has no administration in time range)  insulin aspart (novoLOG) injection 0-9 Units (has no administration in time range)  hydrOXYzine (ATARAX/VISTARIL) tablet 10 mg (has no administration in time range)  acetaminophen (TYLENOL) tablet 650 mg (has no administration in time range)  oxyCODONE-acetaminophen (PERCOCET/ROXICET) 5-325 MG per tablet 1 tablet (has no administration in time range)  0.9 %  sodium chloride infusion (has no administration in time range)  pregabalin (LYRICA) capsule 150 mg (has no administration in time range)  HYDROmorphone (DILAUDID) injection 1 mg (1 mg Intravenous Given 07/31/18 0102)  aspirin chewable tablet 324 mg (324 mg Oral Given 07/31/18 0131)  LORazepam (ATIVAN) injection 1 mg (1 mg Intravenous Given 07/31/18 0131)  sodium chloride 0.9 % bolus 1,000 mL (0 mLs Intravenous Stopped 07/31/18 0410)  HYDROmorphone (DILAUDID) injection 1 mg (1 mg Intravenous Given 07/31/18 0413)  iohexol (OMNIPAQUE) 300 MG/ML solution 100 mL (100 mLs Intravenous Contrast Given 07/31/18 0346)  sodium chloride 0.9 % bolus 1,000 mL (1,000 mLs Intravenous New Bag/Given 07/31/18 0413)  nitroGLYCERIN (NITROGLYN) 2 % ointment 0.5 inch (0.5 inches Topical Given 07/31/18 0537)  morphine 4 MG/ML injection 4 mg (4 mg Intravenous Given 07/31/18 0537)  LORazepam (ATIVAN) injection 0.5 mg (0.5 mg Intravenous Given 07/31/18 0650)  iohexol (OMNIPAQUE) 350 MG/ML injection 100 mL (100 mLs Intravenous Contrast Given 07/31/18 0717)     Initial Impression / Assessment and Plan / ED Course  I have reviewed the triage vital signs and the nursing notes.  Pertinent labs & imaging results that were available during my care of the patient were reviewed by me and considered in my medical decision making (see chart for details).        48 year old female with a history of DDD, anesthesia awareness, chronic pain syndrome, chronic opioid use, IDDM Type I, hypercholesteremia, sickle cell trait, anemia, anxiety,  endometriosis, and PCOS presenting with chest pain, abdominal pain, nausea, vomiting, and diarrhea.  Diarrhea likely began after taking prune juice earlier tonight for chronic constipation secondary to chronic opioid use.   Differential diagnosis includes ACS, PE, dissection, pericarditis, myocarditis, esophageal rupture, tension pneumothorax, gastroparesis secondary to diabetes mellitus, gastritis, pancreatitis, cholecystitis, C. Diff, opioid withdrawal, or URI.   On initial evaluation, the patient was rolling around on the bed and appeared uncomfortable.  She had a rectal temp taken at that time that was 100.1, which improved to 99.4 without treatment.  I suspect this mild elevation may have been secondary to the patient's discomfort and rolling around on the bed for several minutes.  I also have a lower suspicion for infectious etiology as the patient has no leukocytosis and lactate is normal.  Given initial suspicion for fever, blood cultures x2 were drawn at that time.  Pain treated with Dilaudid.  Pressure elevated to the 784O to 962X systolic.  She has no history of hypertension.  She no longer endorses chest pain radiating to the back, but continues to report substernal chest pain.  I suspect blood pressure elevation is due in part to pain.  Glucose is elevated at 372 with normal anion gap and bicarb.  Will  treat with IV fluid bolus as her UA also had small ketones, but did not appear infectious.  EKG with diffuse T wave inversion in the chest leads, which is new from previous and concerning for prolonged QTC.  ASA given. Chest x-ray with borderline cardiomegaly, but otherwise unremarkable.  Delta troponin is negative.   She was given Ativan for anxiety and for nausea given concern for prolonged QTC on initial EKG.  On reevaluation, she continued to report substernal chest pain and epigastric pain.  Although abdominal labs were reassuring, given continued tenderness, CT abdomen pelvis was ordered  for further evaluation, which was unremarkable.  I have a low suspicion for aortic dissection despite the patient's elevated blood pressure as I suspect this is from difficulty controlling the patient's pain.  She is also denying shortness of breath at this time. She is PERC negative.  Lower suspicion for pericarditis, myocarditis, or tamponade etiology at this time.  Abdominal work-up has been reassuring.  She is low risk for COVID-19 as she has had no travel or recent exposures or cough or shortness of breath and low-grade fever resolved in the ER without treatment.  She has no recent risk factors for C. Difficile and opioid withdrawal is unlikely given that she has been compliant with her home medications.  Patient was discussed with Dr. Leonette Monarch, attending physician.  Given the patient's risk factors for cardiovascular disease, including comorbidities, acute changes on EKG, family history, and history of present illness, HEART pathway score was 4.  Given continued chest pain, she was given nitroglycerin paste and blood pressure improved to 120s over 80s within 30 minutes.  Consulted the hospitalist team and Dr. Blaine Hamper will admit for further evaluation of chest pain.  I also called the patient's daughter who is a family medicine nurse in [place] initially after I evaluated the patient and a second time after the patient was agreeable to admission.  She reports that she would like to continue to be updated on the patient's care during admission.  Final Clinical Impressions(s) / ED Diagnoses   Final diagnoses:  Chest pain    ED Discharge Orders    None       Joanne Gavel, PA-C 07/31/18 0836    Fatima Blank, MD 08/01/18 [phone]

## 2018-08-01 DIAGNOSIS — R112 Nausea with vomiting, unspecified: Secondary | ICD-10-CM | POA: Diagnosis not present

## 2018-08-01 DIAGNOSIS — I4589 Other specified conduction disorders: Secondary | ICD-10-CM | POA: Diagnosis not present

## 2018-08-01 DIAGNOSIS — E1365 Other specified diabetes mellitus with hyperglycemia: Secondary | ICD-10-CM | POA: Diagnosis not present

## 2018-08-01 DIAGNOSIS — R109 Unspecified abdominal pain: Secondary | ICD-10-CM | POA: Diagnosis not present

## 2018-08-01 DIAGNOSIS — R197 Diarrhea, unspecified: Secondary | ICD-10-CM | POA: Diagnosis not present

## 2018-08-01 DIAGNOSIS — R0789 Other chest pain: Secondary | ICD-10-CM | POA: Diagnosis not present

## 2018-08-01 LAB — BASIC METABOLIC PANEL WITH GFR
Anion gap: 7 (ref 5–15)
BUN: 6 mg/dL (ref 6–20)
CO2: 24 mmol/L (ref 22–32)
Calcium: 8.2 mg/dL — ABNORMAL LOW (ref 8.9–10.3)
Chloride: 104 mmol/L (ref 98–111)
Creatinine, Ser: 0.79 mg/dL (ref 0.44–1.00)
GFR calc Af Amer: >60 mL/min
GFR calc non Af Amer: >60 mL/min
Glucose, Bld: 466 mg/dL — ABNORMAL HIGH (ref 70–99)
Potassium: 4.2 mmol/L (ref 3.5–5.1)
Sodium: 135 mmol/L (ref 135–145)

## 2018-08-01 LAB — LIPID PANEL
Cholesterol: 225 mg/dL — ABNORMAL HIGH (ref 0–200)
HDL: 33 mg/dL — ABNORMAL LOW
LDL Cholesterol: 166 mg/dL — ABNORMAL HIGH (ref 0–99)
Total CHOL/HDL Ratio: 6.8 ratio
Triglycerides: 130 mg/dL
VLDL: 26 mg/dL (ref 0–40)

## 2018-08-01 LAB — HEMOGLOBIN A1C
Hgb A1c MFr Bld: 9 % — ABNORMAL HIGH (ref 4.8–5.6)
Mean Plasma Glucose: 211.6 mg/dL

## 2018-08-01 LAB — MAGNESIUM: Magnesium: 2.1 mg/dL (ref 1.7–2.4)

## 2018-08-01 LAB — GLUCOSE, CAPILLARY
Glucose-Capillary: 408 mg/dL — ABNORMAL HIGH (ref 70–99)
Glucose-Capillary: 427 mg/dL — ABNORMAL HIGH (ref 70–99)

## 2018-08-01 MED ORDER — LISINOPRIL 2.5 MG PO TABS
2.5000 mg | ORAL_TABLET | Freq: Every day | ORAL | Status: DC
  Administered 2018-08-01: 2.5 mg via ORAL
  Filled 2018-08-01: qty 1

## 2018-08-01 MED ORDER — METOCLOPRAMIDE HCL 5 MG PO TABS
5.0000 mg | ORAL_TABLET | Freq: Four times a day (QID) | ORAL | Status: DC | PRN
  Administered 2018-08-01: 5 mg via ORAL
  Filled 2018-08-01: qty 1

## 2018-08-01 MED ORDER — INSULIN GLARGINE 100 UNIT/ML ~~LOC~~ SOLN: 10.0000 [IU] | Freq: Every day | SUBCUTANEOUS | 11 refills | Status: DC

## 2018-08-01 MED ORDER — INSULIN ASPART 100 UNIT/ML ~~LOC~~ SOLN
0.0000 [IU] | Freq: Three times a day (TID) | SUBCUTANEOUS | Status: DC
  Administered 2018-08-01: 15 [IU] via SUBCUTANEOUS

## 2018-08-01 MED ORDER — INSULIN GLARGINE 100 UNIT/ML ~~LOC~~ SOLN
10.0000 [IU] | Freq: Every day | SUBCUTANEOUS | Status: DC
  Administered 2018-08-01: 11:00:00 10 [IU] via SUBCUTANEOUS
  Filled 2018-08-01 (×3): qty 0.1

## 2018-08-01 MED ORDER — SODIUM CHLORIDE 0.9 % IV BOLUS: 500.0000 mL | Freq: Once | INTRAVENOUS | Status: DC

## 2018-08-01 MED ORDER — ATORVASTATIN CALCIUM 20 MG PO TABS: 20.0000 mg | ORAL_TABLET | Freq: Every day | ORAL | 1 refills | Status: DC

## 2018-08-01 MED ORDER — METOCLOPRAMIDE HCL 5 MG PO TABS: 5.0000 mg | ORAL_TABLET | Freq: Four times a day (QID) | ORAL | 1 refills | Status: DC | PRN

## 2018-08-01 MED ORDER — INSULIN STARTER KIT- SYRINGES (ENGLISH): 1.0000 | Freq: Once | Status: DC

## 2018-08-01 MED ORDER — ATORVASTATIN CALCIUM 10 MG PO TABS: 20.0000 mg | ORAL_TABLET | Freq: Every day | ORAL | Status: DC

## 2018-08-01 MED ORDER — OXYCODONE HCL 5 MG PO TABS
15.0000 mg | ORAL_TABLET | Freq: Four times a day (QID) | ORAL | Status: DC | PRN
  Administered 2018-08-01: 15 mg via ORAL
  Filled 2018-08-01: qty 3

## 2018-08-01 MED ORDER — METOPROLOL TARTRATE 25 MG PO TABS: 25.0000 mg | ORAL_TABLET | Freq: Two times a day (BID) | ORAL | 1 refills | Status: DC

## 2018-08-01 MED ORDER — INSULIN STARTER KIT- SYRINGES (ENGLISH): 1.0000 | Freq: Once | 0 refills | Status: AC

## 2018-08-01 MED ORDER — INSULIN ASPART 100 UNIT/ML ~~LOC~~ SOLN: 0.0000 [IU] | Freq: Every day | SUBCUTANEOUS | Status: DC

## 2018-08-01 MED ORDER — GABAPENTIN 600 MG PO TABS
600.0000 mg | ORAL_TABLET | Freq: Three times a day (TID) | ORAL | Status: DC
  Administered 2018-08-01: 600 mg via ORAL
  Filled 2018-08-01: qty 1

## 2018-08-01 NOTE — Care Management (Signed)
5075 08-01-18 Benefits Check submitted for Lantus 10 mg sq daily. CM will make patient aware of cost. Adelfa Koh Ocie Cornfield, RN,BSN Case Manager [phone]

## 2018-08-01 NOTE — TOC Benefit Eligibility Note (Signed)
Transition of Care [facility]) Benefit Eligibility Note    Patient Details  Name: Karen Stein MRN: [id number] Date of Birth: [birth year]   Medication/Dose: LANTUS  10 UNITS  Covered?: Yes  Tier: 3 Drug  Prescription Coverage Preferred Pharmacy: YES([facility])  Spoke with Person/Company/Phone Number:: KACIE([employer]  # [phone])  Co-Pay: $ 30.00(SAME FOR VIAL OR PEN)  Prior Approval: No  Deductible: Met       Memory Argue Phone Number: 08/01/2018, 11:11 AM

## 2018-08-01 NOTE — Discharge Summary (Signed)
Physician Discharge Summary  Karen Stein UVO:[id number] DOB: 1970/11/29 DOA: 07/31/2018  PCP: [name], MD  Admit date: 07/31/2018 Discharge date: 08/01/2018  Time spent: 45 minutes  Recommendations for Outpatient Follow-up:  1. Follow up with PCP tomorrow as scheduled for evaluation of gastroparesis symptoms and diabetes control. Consider referral to endocrinologist . Monitor BP for optimal control   Discharge Diagnoses:  Principal Problem:   Chest pain Active Problems:   Prolonged QT interval   Nausea vomiting and diarrhea   Abdominal pain   Diabetes mellitus without complication (HCC)   HLD (hyperlipidemia)   Elevated blood protein   DDD (degenerative disc disease), lumbar   Major depressive disorder, recurrent severe without psychotic features ([place])   Anxiety   Discharge Condition: stable.   Diet recommendation: carb modified  Filed Weights   07/31/18 0022 07/31/18 0835 08/01/18 0606  Weight: 108.9 kg 102 kg 102.9 kg    History of present illness:  Karen Stein is a 48 y.o. female with a Past Medical History of HLD, uncontrolled diabetes mellitus type 2, depression, sickle cell trait, and obesity; who presented 07/31/18 with complaints of chest pain, nausea, vomiting, diarrhea. Persisted with nausea and 2 episodes of vomiting.  Hospital Course:  Chest pain/abdominal pain: chest pain free since admission.  Heart score 3-4. Troponin negatie x3. ekg with QT prolongation and non-specific t wave abnormalities. QT prolongation resolved at discharge.   CT chest without PE or dissection. CT abdomen without acute abnormality. No events on tele. greater than 10. Evaluated by cards who opine low suspicion ACS recommended statin and improved diabetes control.  Query mild gastroparesis in setting of uncontrolled diabetes and chronic pain.    Elevated blood pressure: no hx of HTN.  Likely hasundiagnosed hypertension, but abdominal pain and chest pain may have  contributed partially. Metoprolol started with fair control. Recommend close OP follow up for optimal control  DDD (degenerative disc disease), lumbar -continue home regimen  Major depressive disorder, recurrent severe without psychotic features (HCC)andAnxiety: -resume homeWellbutrin   HLD (hyperlipidemia): not taking meds -statin started  Prolonged QT interval: -resolved at discharge  Nausea vomiting and diarrheaand abdominal pain:  No fever, cough or SOB.Low suspicionsfor COVID19. No further emesis or diarrhea but continued with nausea. Likely gastroparesis in setting of narcotics for chronic pain. Much improved at discharge. Will resume home pain regimen with recommendation to minimize as able as well as education regarding improved diabetes control  Diabetes mellitus without complication:Last V4Q59.5 on 04/14/18, poorly controled due to non-compliance with meds and diet. Evaluated by diabetes coordinator who recommends lantus 10 units and amaryl. Has apt with PCP tomorrow   Procedures:  Consultations:  Dr. Sallyanne Kuster cardiology  Discharge Exam: Vitals:   08/01/18 1015 08/01/18 1018  BP: (!) 175/97   Pulse:  82  Resp:    Temp:    SpO2:      General: awake alert ambulating in room with steady gait. Complains abdominal pain Cardiovascular: rrr no mgr no LE edema Respiratory: normal effort BS clear bilaterally  Discharge Instructions   Discharge Instructions    Call MD for:  persistant nausea and vomiting   Complete by:  As directed    Diet - low sodium heart healthy   Complete by:  As directed    Discharge instructions   Complete by:  As directed    Check blood sugar 4x daily Take medications as prescribed Eat small frequent meals Minimize pain medications as able Follow up with PCP as scheduled  regarding diabetes control and gastroparesis symptoms   Increase activity slowly   Complete by:  As directed      Allergies as of 08/01/2018       Reactions   Macrobid [nitrofurantoin Macrocrystal] Nausea Only   Headache and "severe abd cramps"   Shellfish Allergy Swelling   Itching of lips and ears, NOT allergic to iodine contrast   Sulfa Antibiotics Other (See Comments)   Dizzy and sees spots with sulfa drugs      Medication List    STOP taking these medications   ibuprofen 800 MG tablet Commonly known as:  ADVIL,MOTRIN   Ozempic (0.25 or 0.5 MG/DOSE) 2 MG/1.5ML Sopn Generic drug:  Semaglutide(0.25 or 0.5MG/DOS)   valACYclovir 500 MG tablet Commonly known as:  Valtrex     TAKE these medications   atorvastatin 20 MG tablet Commonly known as:  LIPITOR Take 1 tablet (20 mg total) by mouth daily at 6 PM.   buPROPion 300 MG 24 hr tablet Commonly known as:  WELLBUTRIN XL Take 1 tablet (300 mg total) by mouth daily.   diazepam 5 MG tablet Commonly known as:  VALIUM Take 1 tablet (5 mg total) by mouth every 12 (twelve) hours as needed for muscle spasms.   gabapentin 300 MG capsule Commonly known as:  NEURONTIN Take 600 mg by mouth 3 (three) times daily.   glimepiride 2 MG tablet Commonly known as:  AMARYL Take 2 mg by mouth daily with breakfast.   insulin glargine 100 UNIT/ML injection Commonly known as:  LANTUS Inject 0.1 mLs (10 Units total) into the skin daily.   insulin starter kit- syringes Misc 1 kit by Other route once for 1 dose.   metoCLOPramide 5 MG tablet Commonly known as:  REGLAN Take 1 tablet (5 mg total) by mouth every 6 (six) hours as needed for nausea.   metoprolol tartrate 25 MG tablet Commonly known as:  LOPRESSOR Take 1 tablet (25 mg total) by mouth 2 (two) times daily.   oxyCODONE 15 MG immediate release tablet Commonly known as:  ROXICODONE Take 15 mg by mouth 4 (four) times daily.      Allergies  Allergen Reactions  . Macrobid [Nitrofurantoin Macrocrystal] Nausea Only    Headache and "severe abd cramps"  . Shellfish Allergy Swelling    Itching of lips and ears, NOT allergic  to iodine contrast  . Sulfa Antibiotics Other (See Comments)    Dizzy and sees spots with sulfa drugs   Follow-up Information    Croitoru, Mihai, MD Follow up on 11/19/2018.   Specialty:  Cardiology Why:  Please arrive 15 minutes early fo ryour 2:40pm cardiology follow-up appointment Contact information: [address] [place] [address] [phone]            The results of significant diagnostics from this hospitalization (including imaging, microbiology, ancillary and laboratory) are listed below for reference.    Significant Diagnostic Studies: Ct Abdomen Pelvis W Contrast  Result Date: 07/31/2018 CLINICAL DATA:  Abdominal pain with nausea vomiting and diarrhea beginning last night. EXAM: CT ABDOMEN AND PELVIS WITH CONTRAST TECHNIQUE: Multidetector CT imaging of the abdomen and pelvis was performed using the standard protocol following bolus administration of intravenous contrast. CONTRAST:  178m OMNIPAQUE IV COMPARISON:  05/21/2013 FINDINGS: Lower chest:  No contributory findings. Hepatobiliary: No focal liver abnormality.No evidence of biliary obstruction or stone. Pancreas: Unremarkable. Spleen: Unremarkable. Adrenals/Urinary Tract: Negative adrenals. No hydronephrosis or stone. Unremarkable bladder. Stomach/Bowel:  No obstruction. No appendicitis. Vascular/Lymphatic:  No acute vascular abnormality. No mass or adenopathy. Reproductive:Negative. Other: No ascites or pneumoperitoneum. Musculoskeletal: No acute abnormalities. Mild scoliosis with disc and facet degeneration in the lumbar spine, as seen on MRI 06/23/2018. IMPRESSION: Negative for appendicitis or other acute finding. Electronically Signed   By: Monte Fantasia M.D.   On: 07/31/2018 04:13   Dg Chest Portable 1 View  Result Date: 07/31/2018 CLINICAL DATA:  48 year old female with chest pain and vomiting. EXAM: PORTABLE CHEST 1 VIEW COMPARISON:  Chest CT dated 10/31/2016 FINDINGS: The lungs are clear. There  is no pleural effusion or pneumothorax. Borderline cardiomegaly. No acute osseous pathology. IMPRESSION: No active disease. Electronically Signed   By: Anner Crete M.D.   On: 07/31/2018 01:25   Ct Angio Chest Aorta W/cm &/or Wo/cm  Result Date: 07/31/2018 CLINICAL DATA:  Chest pain EXAM: CT ANGIOGRAPHY CHEST WITH CONTRAST TECHNIQUE: Multidetector CT imaging of the chest was performed using the standard protocol during bolus administration of intravenous contrast. Multiplanar CT image reconstructions and MIPs were obtained to evaluate the vascular anatomy. CONTRAST:  160m OMNIPAQUE IOHEXOL 350 MG/ML SOLN COMPARISON:  10/31/2016 FINDINGS: Cardiovascular: Heart is borderline in size. Aorta is normal caliber. No dissection. No filling defects in the pulmonary arteries to suggest pulmonary emboli. Mediastinum/Nodes: No mediastinal, hilar, or axillary adenopathy. Lungs/Pleura: Lungs are clear. No focal airspace opacities or suspicious nodules. Stable right middle lobe scarring. No effusions. Upper Abdomen: Imaging into the upper abdomen shows no acute findings. Musculoskeletal: Chest wall soft tissues are unremarkable.No acute bony abnormality. Review of the MIP images confirms the above findings. IMPRESSION: No evidence of aortic aneurysm or dissection. No evidence of pulmonary embolus. Borderline heart size.  Right middle lobe scarring. No acute cardiopulmonary disease. Electronically Signed   By: KRolm BaptiseM.D.   On: 07/31/2018 07:32    Microbiology: No results found for this or any previous visit (from the past 240 hour(s)).   Labs: Basic Metabolic Panel: Recent Labs  Lab 07/31/18 0029 07/31/18 1146 08/01/18 0623  NA 134*  --  135  K 3.6  --  4.2  CL 99  --  104  CO2 22  --  24  GLUCOSE 372*  --  466*  BUN <5*  --  6  CREATININE 0.76  --  0.79  CALCIUM 8.8*  --  8.2*  MG  --  1.6* 2.1   Liver Function Tests: Recent Labs  Lab 07/31/18 0029  AST 23  ALT 18  ALKPHOS 107  BILITOT  0.6  PROT 7.8  ALBUMIN 3.6   Recent Labs  Lab 07/31/18 0029  LIPASE 27   No results for input(s): AMMONIA in the last 168 hours. CBC: Recent Labs  Lab 07/31/18 0029  WBC 8.1  HGB 14.2  HCT 41.0  MCV 84.9  PLT 355   Cardiac Enzymes: Recent Labs  Lab 07/31/18 0029 07/31/18 0430 07/31/18 0618 07/31/18 1146 07/31/18 1747  TROPONINI <0.03 <0.03 <0.03 <0.03 <0.03   BNP: BNP (last 3 results) No results for input(s): BNP in the last 8760 hours.  ProBNP (last 3 results) No results for input(s): PROBNP in the last 8760 hours.  CBG: Recent Labs  Lab 07/31/18 1239 07/31/18 1635 07/31/18 2158 08/01/18 0714 08/01/18 0809  GLUCAP 244* 352* 317* 408* 427*       Signed:  BRadene GunningNP  [facility] 08/01/2018, 10:30 AM

## 2018-08-05 LAB — CULTURE, BLOOD (ROUTINE X 2)
Culture: NO GROWTH
Culture: NO GROWTH
Special Requests: ADEQUATE

## 2018-08-08 ENCOUNTER — Telehealth: Payer: Self-pay

## 2018-08-08 NOTE — Telephone Encounter (Signed)
I called the pt to do a transition of care and to schedule an appt and the pt said that she no longer comes to the office.

## 2018-08-16 ENCOUNTER — Telehealth: Payer: BLUE CROSS/BLUE SHIELD | Admitting: Physician Assistant

## 2018-10-14 ENCOUNTER — Ambulatory Visit: Payer: BLUE CROSS/BLUE SHIELD | Admitting: Neurology

## 2018-11-18 NOTE — Progress Notes (Deleted)
NEUROLOGY CONSULTATION NOTE  Karen Stein MRN: [id number] DOB: 04/07/1971  Referring provider: [name], MD Primary care provider: [name], MD  Reason for consult:  Balance problems and left sided weakness  HISTORY OF PRESENT ILLNESS: Karen Stein is a 48 year old woman with chronic low back pain s/p lumbar spine surgery, type 2 diabetes mellitus, fibromyalgia, and anxiety who presents for balance problems and left sided weakness.  History supplemented by referring provider note.  ***  She has chronic low back pain with  History of prior L5-S1 PLIF.  To further evaluate left lower extremity radiating pain, MRI of lumbar spine was performed on 06/23/18, which was personally reviewed, and demonstrated degenerative changes at L2-3 and L3-4 with disc protrusion impinging the left L3 nerve.  She has type 2 diabetes mellitus.  Hgb A1c from 08/01/18 was 9.0.  PAST MEDICAL HISTORY: Past Medical History:  Diagnosis Date  . Anemia   . Anxiety   . Arthritis   . Chronic pain   . Complication of anesthesia    anesthesia awareness,  . DDD (degenerative disc disease)   . DDD (degenerative disc disease)   . Degenerative disc disease   . Degenerative disc disease   . Diabetes mellitus    Type 2 insulin resistant  . Dysrhythmia    stress and caffeine related  . Endometriosis   . Fibroids   . Fibromyalgia   . GERD (gastroesophageal reflux disease)   . High cholesterol   . Insomnia   . Leg cramps   . PCOS (polycystic ovarian syndrome)   . Scoliosis   . Sickle cell trait ([place])     PAST SURGICAL HISTORY: Past Surgical History:  Procedure Laterality Date  . BACK SURGERY  2010  . BIOPSY BREAST    . CESAREAN SECTION    . DILATATION & CURETTAGE/HYSTEROSCOPY WITH MYOSURE N/A 01/01/2015   Procedure: DILATATION & CURETTAGE/HYSTEROSCOPY WITH MYOSURE;  Surgeon: Crawford Givens, MD;  Location: [facility];  Service: Gynecology;  Laterality: N/A;  . LAPAROSCOPY  2002 or 2003  .  TOE SURGERY  2008  . WISDOM TOOTH EXTRACTION      MEDICATIONS: Current Outpatient Medications on File Prior to Visit  Medication Sig Dispense Refill  . atorvastatin (LIPITOR) 20 MG tablet Take 1 tablet (20 mg total) by mouth daily at 6 PM. 30 tablet 1  . gabapentin (NEURONTIN) 300 MG capsule Take 600 mg by mouth 3 (three) times daily.    Marland Kitchen glimepiride (AMARYL) 2 MG tablet Take 2 mg by mouth daily with breakfast.    . insulin glargine (LANTUS) 100 UNIT/ML injection Inject 0.1 mLs (10 Units total) into the skin daily. 10 mL 11  . metoCLOPramide (REGLAN) 5 MG tablet Take 1 tablet (5 mg total) by mouth every 6 (six) hours as needed for nausea. (Patient not taking: Reported on 08/08/2018) 90 tablet 1  . metoprolol tartrate (LOPRESSOR) 25 MG tablet Take 1 tablet (25 mg total) by mouth 2 (two) times daily. 60 tablet 1  . oxyCODONE (ROXICODONE) 15 MG immediate release tablet Take 15 mg by mouth 4 (four) times daily.     No current facility-administered medications on file prior to visit.     ALLERGIES: Allergies  Allergen Reactions  . Macrobid [Nitrofurantoin Macrocrystal] Nausea Only    Headache and "severe abd cramps"  . Shellfish Allergy Swelling    Itching of lips and ears, NOT allergic to iodine contrast  . Sulfa Antibiotics Other (See Comments)    Dizzy  and sees spots with sulfa drugs    FAMILY HISTORY: Family History  Problem Relation Age of Onset  . Diabetes Father   . Hypertension Father   . Hyperlipidemia Father   . Kidney failure Father   . Diabetes Paternal Grandfather   . Hypertension Paternal Grandfather   . Hyperlipidemia Paternal Grandfather   . Diabetes Paternal Grandmother   . Hypertension Paternal Grandmother   . Hyperlipidemia Paternal Grandmother   . Alzheimer's disease Maternal Grandmother   . Brain cancer Maternal Grandfather    ***.  SOCIAL HISTORY: Social History   Socioeconomic History  . Marital status: Married    Spouse name: Not on file  .  Number of children: Not on file  . Years of education: Not on file  . Highest education level: Not on file  Occupational History  . Not on file  Social Needs  . Financial resource strain: Not on file  . Food insecurity    Worry: Not on file    Inability: Not on file  . Transportation needs    Medical: Not on file    Non-medical: Not on file  Tobacco Use  . Smoking status: Never Smoker  . Smokeless tobacco: Never Used  Substance and Sexual Activity  . Alcohol use: No    Alcohol/week: 0.0 standard drinks  . Drug use: No  . Sexual activity: Not on file  Lifestyle  . Physical activity    Days per week: Not on file    Minutes per session: Not on file  . Stress: Not on file  Relationships  . Social Herbalist on phone: Not on file    Gets together: Not on file    Attends religious service: Not on file    Active member of club or organization: Not on file    Attends meetings of clubs or organizations: Not on file    Relationship status: Not on file  . Intimate partner violence    Fear of current or ex partner: Not on file    Emotionally abused: Not on file    Physically abused: Not on file    Forced sexual activity: Not on file  Other Topics Concern  . Not on file  Social History Narrative  . Not on file    REVIEW OF SYSTEMS: Constitutional: No fevers, chills, or sweats, no generalized fatigue, change in appetite Eyes: No visual changes, double vision, eye pain Ear, nose and throat: No hearing loss, ear pain, nasal congestion, sore throat Cardiovascular: No chest pain, palpitations Respiratory:  No shortness of breath at rest or with exertion, wheezes GastrointestinaI: No nausea, vomiting, diarrhea, abdominal pain, fecal incontinence Genitourinary:  No dysuria, urinary retention or frequency Musculoskeletal:  No neck pain, back pain Integumentary: No rash, pruritus, skin lesions Neurological: as above Psychiatric: No depression, insomnia, anxiety Endocrine:  No palpitations, fatigue, diaphoresis, mood swings, change in appetite, change in weight, increased thirst Hematologic/Lymphatic:  No purpura, petechiae. Allergic/Immunologic: no itchy/runny eyes, nasal congestion, recent allergic reactions, rashes  PHYSICAL EXAM: *** General: No acute distress.  Patient appears ***-groomed.  *** Head:  Normocephalic/atraumatic Eyes:  fundi examined but not visualized Neck: supple, no paraspinal tenderness, full range of motion Back: No paraspinal tenderness Heart: regular rate and rhythm Lungs: Clear to auscultation bilaterally. Vascular: No carotid bruits. Neurological Exam: Mental status: alert and oriented to person, place, and time, recent and remote memory intact, fund of knowledge intact, attention and concentration intact, speech fluent and not dysarthric, language  intact. Cranial nerves: CN I: not tested CN II: pupils equal, round and reactive to light, visual fields intact CN III, IV, VI:  full range of motion, no nystagmus, no ptosis CN V: facial sensation intact CN VII: upper and lower face symmetric CN VIII: hearing intact CN IX, X: gag intact, uvula midline CN XI: sternocleidomastoid and trapezius muscles intact CN XII: tongue midline Bulk & Tone: normal, no fasciculations. Motor:  5/5 throughout *** Sensation:  Pinprick *** temperature *** and vibration sensation intact.  ***. Deep Tendon Reflexes:  2+ throughout, *** toes downgoing.  *** Finger to nose testing:  Without dysmetria.  *** Heel to shin:  Without dysmetria.  *** Gait:  Normal station and stride.  Able to turn and tandem walk. Rom[place] ***.  IMPRESSION: ***  PLAN: ***  Thank you for allowing me to take part in the care of this patient.  Metta Clines, DO  CC: ***

## 2018-11-19 ENCOUNTER — Ambulatory Visit: Payer: BC Managed Care – PPO | Admitting: Neurology

## 2018-11-19 ENCOUNTER — Ambulatory Visit: Payer: BC Managed Care – PPO | Admitting: Cardiovascular Disease

## 2019-01-17 ENCOUNTER — Encounter (HOSPITAL_COMMUNITY): Payer: Self-pay

## 2019-01-17 ENCOUNTER — Other Ambulatory Visit: Payer: Self-pay

## 2019-01-17 ENCOUNTER — Emergency Department (HOSPITAL_COMMUNITY)
Admission: EM | Admit: 2019-01-17 | Discharge: 2019-01-18 | Disposition: A | Discharge status: Home / Self Care | Payer: BC Managed Care – PPO | Attending: Emergency Medicine | Admitting: Emergency Medicine

## 2019-01-17 DIAGNOSIS — E119 Type 2 diabetes mellitus without complications: Secondary | ICD-10-CM | POA: Insufficient documentation

## 2019-01-17 DIAGNOSIS — Z794 Long term (current) use of insulin: Secondary | ICD-10-CM | POA: Insufficient documentation

## 2019-01-17 DIAGNOSIS — M5441 Lumbago with sciatica, right side: Secondary | ICD-10-CM | POA: Diagnosis not present

## 2019-01-17 DIAGNOSIS — Z79899 Other long term (current) drug therapy: Secondary | ICD-10-CM | POA: Insufficient documentation

## 2019-01-17 DIAGNOSIS — M545 Low back pain: Secondary | ICD-10-CM | POA: Diagnosis present

## 2019-01-17 MED ORDER — FENTANYL CITRATE (PF) 100 MCG/2ML IJ SOLN
50.0000 ug | Freq: Once | INTRAMUSCULAR | Status: AC
  Administered 2019-01-18: 50 ug via INTRAVENOUS
  Filled 2019-01-17: qty 2

## 2019-01-17 NOTE — ED Triage Notes (Signed)
Pt states she has 2 herniated disks and a pinched nerve. Pt states that the pain is radiating down to her feet.   Pt states when she gets like this, she typically needs an injection

## 2019-01-18 ENCOUNTER — Emergency Department (HOSPITAL_COMMUNITY): Payer: BC Managed Care – PPO

## 2019-01-18 MED ORDER — KETOROLAC TROMETHAMINE 30 MG/ML IJ SOLN
15.0000 mg | Freq: Once | INTRAMUSCULAR | Status: AC
  Administered 2019-01-18: 15 mg via INTRAVENOUS
  Filled 2019-01-18: qty 1

## 2019-01-18 NOTE — ED Provider Notes (Signed)
[facility] DEPT Provider Note   CSN: [id number] Arrival date & time: 01/17/19  1652     History   Chief Complaint Chief Complaint  Patient presents with  . Back Pain    HPI Karen Stein is a 48 y.o. female with past medical history of DM 2, fibromyalgia, fibroids, PCOS, sickle cell trait, scoliosis, prolonged QT interval, arthritis, who presents today for evaluation of back pain.  She reports that over the weekend she helped her child move.  She says that she did not pick up anything heavy however since then she has had worsening pain in her lower back and right sided hip.  She reports that this is the usual location of her back pain.   Her pain shoots down her right leg.  She denies any personal history of cancer or injection drug use.    Review of [place] PMP shows that she gets 30 mg of oxycodone IR every 6 hours as needed for pain and has a current prescription for this.  Chart review also shows that she takes gabapentin, Cymbalta, and Zanaflex.     HPI  Past Medical History:  Diagnosis Date  . Anemia   . Anxiety   . Arthritis   . Chronic pain   . Complication of anesthesia    anesthesia awareness,  . DDD (degenerative disc disease)   . DDD (degenerative disc disease)   . Degenerative disc disease   . Degenerative disc disease   . Diabetes mellitus    Type 2 insulin resistant  . Dysrhythmia    stress and caffeine related  . Endometriosis   . Fibroids   . Fibromyalgia   . GERD (gastroesophageal reflux disease)   . High cholesterol   . Insomnia   . Leg cramps   . PCOS (polycystic ovarian syndrome)   . Scoliosis   . Sickle cell trait [facility])     Patient Active Problem List   Diagnosis Date Noted  . HLD (hyperlipidemia) 07/31/2018  . Prolonged QT interval 07/31/2018  . Chest pain 07/31/2018  . Nausea vomiting and diarrhea 07/31/2018  . Abdominal pain 07/31/2018  . Elevated blood protein 07/31/2018  . Diabetes mellitus  without complication ([place]) 0000000  . Anxiety   . Major depressive disorder, recurrent severe without psychotic features ([place]) 10/12/2015  . Degenerative disc disease   . Sickle cell trait ([place])   . DDD (degenerative disc disease), lumbar     Past Surgical History:  Procedure Laterality Date  . BACK SURGERY  2010  . BIOPSY BREAST    . CESAREAN SECTION    . DILATATION & CURETTAGE/HYSTEROSCOPY WITH MYOSURE N/A 01/01/2015   Procedure: DILATATION & CURETTAGE/HYSTEROSCOPY WITH MYOSURE;  Surgeon: Crawford Givens, MD;  Location: [facility];  Service: Gynecology;  Laterality: N/A;  . LAPAROSCOPY  2002 or 2003  . TOE SURGERY  2008  . WISDOM TOOTH EXTRACTION       OB History    Gravida  4   Para  2   Term  1   Preterm  1   AB  2   Living  1     SAB  2   TAB      Ectopic      Multiple      Live Births               Home Medications    Prior to Admission medications   Medication Sig Start Date End Date Taking? Authorizing Provider  DULoxetine (CYMBALTA) 30 MG capsule Take 30 mg by mouth at bedtime. 01/04/19  Yes [provider]  gabapentin (NEURONTIN) 300 MG capsule Take 600 mg by mouth 3 (three) times daily. 06/17/18  Yes [provider]  glimepiride (AMARYL) 4 MG tablet Take 4 mg by mouth daily. 11/15/18  Yes [provider]  insulin glargine (LANTUS) 100 UNIT/ML injection Inject 0.1 mLs (10 Units total) into the skin daily. 08/01/18  Yes Black, Lezlie Octave, NP  JARDIANCE 25 MG TABS tablet Take 25 mg by mouth daily. 01/10/19  Yes [provider]  oxycodone (ROXICODONE) 30 MG immediate release tablet Take 30 mg by mouth every 6 (six) hours as needed for pain. 01/04/19  Yes [provider]  tiZANidine (ZANAFLEX) 4 MG tablet Take 4 mg by mouth 3 (three) times daily. 01/11/19  Yes [provider]  atorvastatin (LIPITOR) 20 MG tablet Take 1 tablet (20 mg total) by mouth daily at 6 PM. Patient not taking: Reported on 01/17/2019 08/01/18    Radene Gunning, NP  metoCLOPramide (REGLAN) 5 MG tablet Take 1 tablet (5 mg total) by mouth every 6 (six) hours as needed for nausea. Patient not taking: Reported on 08/08/2018 08/01/18   Radene Gunning, NP  metoprolol tartrate (LOPRESSOR) 25 MG tablet Take 1 tablet (25 mg total) by mouth 2 (two) times daily. Patient not taking: Reported on 01/17/2019 08/01/18   Radene Gunning, NP    Family History Family History  Problem Relation Age of Onset  . Diabetes Father   . Hypertension Father   . Hyperlipidemia Father   . Kidney failure Father   . Diabetes Paternal Grandfather   . Hypertension Paternal Grandfather   . Hyperlipidemia Paternal Grandfather   . Diabetes Paternal Grandmother   . Hypertension Paternal Grandmother   . Hyperlipidemia Paternal Grandmother   . Alzheimer's disease Maternal Grandmother   . Brain cancer Maternal Grandfather     Social History Social History   Tobacco Use  . Smoking status: Never Smoker  . Smokeless tobacco: Never Used  Substance Use Topics  . Alcohol use: No    Alcohol/week: 0.0 standard drinks  . Drug use: No     Allergies   Macrobid [nitrofurantoin macrocrystal], Shellfish allergy, and Sulfa antibiotics   Review of Systems Review of Systems  Constitutional: Negative for chills and fever.  Respiratory: Negative for chest tightness and shortness of breath.   Cardiovascular: Negative for chest pain.  Gastrointestinal: Negative for abdominal pain, nausea and vomiting.  Genitourinary: Negative for dysuria.       No changes to bowel/bladder function.  Musculoskeletal: Positive for back pain. Negative for neck pain.  Neurological: Negative for weakness, numbness and headaches.  Psychiatric/Behavioral: Negative for confusion.  All other systems reviewed and are negative.    Physical Exam Updated Vital Signs BP (!) 148/86   Pulse 80   Temp 98.7 F (37.1 C) (Oral)   Resp 14   Wt 87.5 kg   LMP 11/13/2015 (Exact Date)   SpO2 98%   BMI  29.32 kg/m   Physical Exam Vitals signs and nursing note reviewed.  Constitutional:      General: She is not in acute distress.    Appearance: She is well-developed. She is not diaphoretic.  HENT:     Head: Normocephalic and atraumatic.  Eyes:     General: No scleral icterus.       Right eye: No discharge.        Left eye: No  discharge.     Conjunctiva/sclera: Conjunctivae normal.  Neck:     Musculoskeletal: Normal range of motion.  Cardiovascular:     Rate and Rhythm: Normal rate and regular rhythm.     Pulses: Normal pulses.     Comments: 2+ DP/PT pulses bilaterally. Pulmonary:     Effort: Pulmonary effort is normal. No respiratory distress.     Breath sounds: No stridor.  Abdominal:     General: There is no distension.  Musculoskeletal:        General: No deformity.     Right lower leg: No edema.     Left lower leg: No edema.     Comments: She has diffuse pain in her lumbar spine.  She has tenderness bilaterally, primarily on the right side and into the right hip.  No midline step-offs or deformities palpated.  Palpation in the right buttocks both re-creates and exacerbates her reported pain.  Skin:    General: Skin is warm and dry.  Neurological:     Mental Status: She is alert.     Motor: No abnormal muscle tone.     Comments: Sensation intact to bilateral lower extremities to light touch.  Normal gait.  5/5 strength bilateral ankle dorsiflexion and plantar flexion.    Psychiatric:        Mood and Affect: Mood normal.        Behavior: Behavior normal.      ED Treatments / Results  Labs (all labs ordered are listed, but only abnormal results are displayed) Labs Reviewed - No data to display  EKG None  Radiology No results found.  Procedures Procedures (including critical care time)  Medications Ordered in ED Medications  fentaNYL (SUBLIMAZE) injection 50 mcg (50 mcg Intravenous Given 01/18/19 0010)  ketorolac (TORADOL) 30 MG/ML injection 15 mg (15 mg  Intravenous Given 01/18/19 0111)     Initial Impression / Assessment and Plan / ED Course  I have reviewed the triage vital signs and the nursing notes.  Pertinent labs & imaging results that were available during my care of the patient were reviewed by me and considered in my medical decision making (see chart for details).  Clinical Course as of Jan 18 207  Sat Jan 18, 2019  0041 Patient eating a sandwich.    [EH]    Clinical Course User Index [EH] Lorin Glass, PA-C      Patient presents today for evaluation of acute worsening of her chronic low back pain.  On exam she initially appeared uncomfortable, however once her pain was treated with fentanyl and Toradol she was more comfortable.  She was able to eat a sandwich without difficulty.  I recommended L-spine imaging which patient refused stating that she wished to go home instead.  We discussed the risks of making this decision and she stated her understanding.    She was able to ambulate to and from the bathroom without difficulty.  She denies any changes to bowel and bladder function.  Review of her medication shows that she is taking Zanaflex, she gets chronic p.o. narcotics at home, she takes gabapentin.  Chart review also shows that she is diabetic and normally her blood sugars are not controlled therefore I do not think that steroids for her pain would be a safe choice.  I recommended that she follow-up with her primary doctors.  Labs were reviewed prior to giving Toradol which showed that she has baseline normal renal function.  Return precautions were discussed with  patient who states their understanding.  At the time of discharge patient denied any unaddressed complaints or concerns.  Patient is agreeable for discharge home.   Final Clinical Impressions(s) / ED Diagnoses   Final diagnoses:  Acute midline low back pain with right-sided sciatica    ED Discharge Orders    None       Ollen Gross 01/18/19 0208    Ezequiel Essex, MD 01/18/19 [phone]

## 2019-01-18 NOTE — Discharge Instructions (Signed)
Today you received medications that may make you sleepy or impair your ability to make decisions.  For the next 24 hours please do not drive, operate heavy machinery, care for a small child with out another adult present, or perform any activities that may cause harm to you or someone else if you were to fall asleep or be impaired.   

## 2019-01-26 ENCOUNTER — Emergency Department (HOSPITAL_COMMUNITY): Payer: BC Managed Care – PPO

## 2019-01-26 ENCOUNTER — Inpatient Hospital Stay (HOSPITAL_COMMUNITY): Payer: BC Managed Care – PPO

## 2019-01-26 ENCOUNTER — Other Ambulatory Visit: Payer: Self-pay

## 2019-01-26 ENCOUNTER — Inpatient Hospital Stay (HOSPITAL_COMMUNITY)
Admission: EM | Admit: 2019-01-26 | Discharge: 2019-01-29 | DRG: 638 | Disposition: A | Discharge status: Home Health | Payer: BC Managed Care – PPO | Attending: Family Medicine | Admitting: Family Medicine

## 2019-01-26 ENCOUNTER — Encounter (HOSPITAL_COMMUNITY): Payer: Self-pay | Admitting: Emergency Medicine

## 2019-01-26 DIAGNOSIS — F419 Anxiety disorder, unspecified: Secondary | ICD-10-CM | POA: Diagnosis present

## 2019-01-26 DIAGNOSIS — K5900 Constipation, unspecified: Secondary | ICD-10-CM | POA: Diagnosis present

## 2019-01-26 DIAGNOSIS — G4700 Insomnia, unspecified: Secondary | ICD-10-CM | POA: Diagnosis present

## 2019-01-26 DIAGNOSIS — E785 Hyperlipidemia, unspecified: Secondary | ICD-10-CM | POA: Diagnosis present

## 2019-01-26 DIAGNOSIS — K210 Gastro-esophageal reflux disease with esophagitis: Secondary | ICD-10-CM | POA: Diagnosis present

## 2019-01-26 DIAGNOSIS — F329 Major depressive disorder, single episode, unspecified: Secondary | ICD-10-CM

## 2019-01-26 DIAGNOSIS — Z6832 Body mass index (BMI) 32.0-32.9, adult: Secondary | ICD-10-CM

## 2019-01-26 DIAGNOSIS — I6381 Other cerebral infarction due to occlusion or stenosis of small artery: Secondary | ICD-10-CM | POA: Insufficient documentation

## 2019-01-26 DIAGNOSIS — Z833 Family history of diabetes mellitus: Secondary | ICD-10-CM | POA: Diagnosis not present

## 2019-01-26 DIAGNOSIS — R17 Unspecified jaundice: Secondary | ICD-10-CM | POA: Diagnosis present

## 2019-01-26 DIAGNOSIS — E669 Obesity, unspecified: Secondary | ICD-10-CM | POA: Diagnosis present

## 2019-01-26 DIAGNOSIS — K449 Diaphragmatic hernia without obstruction or gangrene: Secondary | ICD-10-CM | POA: Diagnosis present

## 2019-01-26 DIAGNOSIS — K209 Esophagitis, unspecified without bleeding: Secondary | ICD-10-CM | POA: Insufficient documentation

## 2019-01-26 DIAGNOSIS — Z9989 Dependence on other enabling machines and devices: Secondary | ICD-10-CM | POA: Diagnosis not present

## 2019-01-26 DIAGNOSIS — G4733 Obstructive sleep apnea (adult) (pediatric): Secondary | ICD-10-CM | POA: Diagnosis present

## 2019-01-26 DIAGNOSIS — G8929 Other chronic pain: Secondary | ICD-10-CM | POA: Diagnosis present

## 2019-01-26 DIAGNOSIS — R296 Repeated falls: Secondary | ICD-10-CM | POA: Diagnosis present

## 2019-01-26 DIAGNOSIS — Z20828 Contact with and (suspected) exposure to other viral communicable diseases: Secondary | ICD-10-CM | POA: Diagnosis present

## 2019-01-26 DIAGNOSIS — Z79899 Other long term (current) drug therapy: Secondary | ICD-10-CM

## 2019-01-26 DIAGNOSIS — Z8249 Family history of ischemic heart disease and other diseases of the circulatory system: Secondary | ICD-10-CM

## 2019-01-26 DIAGNOSIS — N179 Acute kidney failure, unspecified: Secondary | ICD-10-CM | POA: Diagnosis present

## 2019-01-26 DIAGNOSIS — F32A Depression, unspecified: Secondary | ICD-10-CM

## 2019-01-26 DIAGNOSIS — M797 Fibromyalgia: Secondary | ICD-10-CM | POA: Diagnosis present

## 2019-01-26 DIAGNOSIS — K047 Periapical abscess without sinus: Secondary | ICD-10-CM | POA: Diagnosis present

## 2019-01-26 DIAGNOSIS — D573 Sickle-cell trait: Secondary | ICD-10-CM | POA: Diagnosis present

## 2019-01-26 DIAGNOSIS — Z882 Allergy status to sulfonamides status: Secondary | ICD-10-CM

## 2019-01-26 DIAGNOSIS — E8881 Metabolic syndrome: Secondary | ICD-10-CM | POA: Diagnosis present

## 2019-01-26 DIAGNOSIS — K920 Hematemesis: Secondary | ICD-10-CM | POA: Diagnosis not present

## 2019-01-26 DIAGNOSIS — Z794 Long term (current) use of insulin: Secondary | ICD-10-CM

## 2019-01-26 DIAGNOSIS — E1110 Type 2 diabetes mellitus with ketoacidosis without coma: Secondary | ICD-10-CM | POA: Diagnosis not present

## 2019-01-26 DIAGNOSIS — E282 Polycystic ovarian syndrome: Secondary | ICD-10-CM | POA: Diagnosis present

## 2019-01-26 DIAGNOSIS — Z91013 Allergy to seafood: Secondary | ICD-10-CM

## 2019-01-26 DIAGNOSIS — Z8349 Family history of other endocrine, nutritional and metabolic diseases: Secondary | ICD-10-CM

## 2019-01-26 DIAGNOSIS — Z8673 Personal history of transient ischemic attack (TIA), and cerebral infarction without residual deficits: Secondary | ICD-10-CM

## 2019-01-26 DIAGNOSIS — R748 Abnormal levels of other serum enzymes: Secondary | ICD-10-CM | POA: Diagnosis not present

## 2019-01-26 DIAGNOSIS — E860 Dehydration: Secondary | ICD-10-CM | POA: Diagnosis present

## 2019-01-26 DIAGNOSIS — M419 Scoliosis, unspecified: Secondary | ICD-10-CM | POA: Diagnosis present

## 2019-01-26 DIAGNOSIS — E872 Acidosis, unspecified: Secondary | ICD-10-CM

## 2019-01-26 DIAGNOSIS — Z883 Allergy status to other anti-infective agents status: Secondary | ICD-10-CM

## 2019-01-26 DIAGNOSIS — R112 Nausea with vomiting, unspecified: Secondary | ICD-10-CM | POA: Diagnosis not present

## 2019-01-26 LAB — URINALYSIS, ROUTINE W REFLEX MICROSCOPIC
Bilirubin Urine: NEGATIVE
Glucose, UA: >=500 mg/dL — AB
Ketones, ur: 80 mg/dL — AB
Leukocytes,Ua: NEGATIVE
Nitrite: NEGATIVE
Protein, ur: NEGATIVE mg/dL
Specific Gravity, Urine: 1.017 (ref 1.005–1.030)
pH: 5 (ref 5.0–8.0)

## 2019-01-26 LAB — BASIC METABOLIC PANEL
Anion gap: 21 — ABNORMAL HIGH (ref 5–15)
BUN: 22 mg/dL — ABNORMAL HIGH (ref 6–20)
CO2: 9 mmol/L — ABNORMAL LOW (ref 22–32)
Calcium: 8.4 mg/dL — ABNORMAL LOW (ref 8.9–10.3)
Chloride: 112 mmol/L — ABNORMAL HIGH (ref 98–111)
Creatinine, Ser: 1.57 mg/dL — ABNORMAL HIGH (ref 0.44–1.00)
GFR calc Af Amer: 44 mL/min — ABNORMAL LOW (ref >60)
GFR calc non Af Amer: 38 mL/min — ABNORMAL LOW (ref >60)
Glucose, Bld: 263 mg/dL — ABNORMAL HIGH (ref 70–99)
Potassium: 3.6 mmol/L (ref 3.5–5.1)
Sodium: 142 mmol/L (ref 135–145)

## 2019-01-26 LAB — COMPREHENSIVE METABOLIC PANEL
ALT: 16 U/L (ref 0–44)
AST: 14 U/L — ABNORMAL LOW (ref 15–41)
Albumin: 3.9 g/dL (ref 3.5–5.0)
Alkaline Phosphatase: 113 U/L (ref 38–126)
Anion gap: 29 — ABNORMAL HIGH (ref 5–15)
BUN: 26 mg/dL — ABNORMAL HIGH (ref 6–20)
CO2: 11 mmol/L — ABNORMAL LOW (ref 22–32)
Calcium: 9.3 mg/dL (ref 8.9–10.3)
Chloride: 99 mmol/L (ref 98–111)
Creatinine, Ser: 2.01 mg/dL — ABNORMAL HIGH (ref 0.44–1.00)
GFR calc Af Amer: 33 mL/min — ABNORMAL LOW (ref >60)
GFR calc non Af Amer: 28 mL/min — ABNORMAL LOW (ref >60)
Glucose, Bld: 727 mg/dL (ref 70–99)
Potassium: 4 mmol/L (ref 3.5–5.1)
Sodium: 139 mmol/L (ref 135–145)
Total Bilirubin: 1.6 mg/dL — ABNORMAL HIGH (ref 0.3–1.2)
Total Protein: 8.6 g/dL — ABNORMAL HIGH (ref 6.5–8.1)

## 2019-01-26 LAB — CBC
HCT: 53.4 % — ABNORMAL HIGH (ref 36.0–46.0)
HCT: 48.1 % — ABNORMAL HIGH (ref 36.0–46.0)
Hemoglobin: 18 g/dL — ABNORMAL HIGH (ref 12.0–15.0)
Hemoglobin: 16.4 g/dL — ABNORMAL HIGH (ref 12.0–15.0)
MCH: 29.9 pg (ref 26.0–34.0)
MCH: 30.1 pg (ref 26.0–34.0)
MCHC: 33.7 g/dL (ref 30.0–36.0)
MCHC: 34.1 g/dL (ref 30.0–36.0)
MCV: 88.7 fL (ref 80.0–100.0)
MCV: 88.3 fL (ref 80.0–100.0)
Platelets: 407 10*3/uL — ABNORMAL HIGH (ref 150–400)
Platelets: 371 10*3/uL (ref 150–400)
RBC: 6.02 MIL/uL — ABNORMAL HIGH (ref 3.87–5.11)
RBC: 5.45 MIL/uL — ABNORMAL HIGH (ref 3.87–5.11)
RDW: 13.2 % (ref 11.5–15.5)
RDW: 13.4 % (ref 11.5–15.5)
WBC: 20.5 10*3/uL — ABNORMAL HIGH (ref 4.0–10.5)
WBC: 24.5 10*3/uL — ABNORMAL HIGH (ref 4.0–10.5)
nRBC: 0 % (ref 0.0–0.2)
nRBC: 0 % (ref 0.0–0.2)

## 2019-01-26 LAB — POCT I-STAT EG7
Acid-base deficit: 15 mmol/L — ABNORMAL HIGH (ref 0.0–2.0)
Bicarbonate: 11 mmol/L — ABNORMAL LOW (ref 20.0–28.0)
Calcium, Ion: 1.11 mmol/L — ABNORMAL LOW (ref 1.15–1.40)
HCT: 55 % — ABNORMAL HIGH (ref 36.0–46.0)
Hemoglobin: 18.7 g/dL — ABNORMAL HIGH (ref 12.0–15.0)
O2 Saturation: 99 %
Potassium: 4 mmol/L (ref 3.5–5.1)
Sodium: 137 mmol/L (ref 135–145)
TCO2: 12 mmol/L — ABNORMAL LOW (ref 22–32)
pCO2, Ven: 27.1 mmHg — ABNORMAL LOW (ref 44.0–60.0)
pH, Ven: 7.218 — ABNORMAL LOW (ref 7.250–7.430)
pO2, Ven: 164 mmHg — ABNORMAL HIGH (ref 32.0–45.0)

## 2019-01-26 LAB — I-STAT BETA HCG BLOOD, ED (MC, WL, AP ONLY): I-stat hCG, quantitative: <5 m[IU]/mL (ref <5)

## 2019-01-26 LAB — GASTRIC OCCULT BLOOD (1-CARD TO LAB): Occult Blood, Gastric: POSITIVE — AB

## 2019-01-26 LAB — SARS CORONAVIRUS 2 BY RT PCR (HOSPITAL ORDER, PERFORMED IN ~~LOC~~ HOSPITAL LAB): SARS Coronavirus 2: NEGATIVE

## 2019-01-26 LAB — CBG MONITORING, ED: Glucose-Capillary: >600 mg/dL (ref 70–99)

## 2019-01-26 LAB — LIPASE, BLOOD: Lipase: 17 U/L (ref 11–51)

## 2019-01-26 MED ORDER — SODIUM CHLORIDE 0.9 % IV BOLUS
1000.0000 mL | Freq: Once | INTRAVENOUS | Status: AC
  Administered 2019-01-26: 1000 mL via INTRAVENOUS

## 2019-01-26 MED ORDER — POTASSIUM CHLORIDE 10 MEQ/100ML IV SOLN
10.0000 meq | Freq: Once | INTRAVENOUS | Status: AC
  Administered 2019-01-26: 10 meq via INTRAVENOUS
  Filled 2019-01-26: qty 100

## 2019-01-26 MED ORDER — DEXTROSE-NACL 5-0.45 % IV SOLN
INTRAVENOUS | Status: DC
  Administered 2019-01-27: via INTRAVENOUS

## 2019-01-26 MED ORDER — ONDANSETRON 4 MG PO TBDP
4.0000 mg | ORAL_TABLET | Freq: Once | ORAL | Status: AC | PRN
  Administered 2019-01-26: 4 mg via ORAL
  Filled 2019-01-26: qty 1

## 2019-01-26 MED ORDER — ONDANSETRON HCL 4 MG/2ML IJ SOLN
4.0000 mg | Freq: Once | INTRAMUSCULAR | Status: AC
  Administered 2019-01-26: 4 mg via INTRAVENOUS
  Filled 2019-01-26: qty 2

## 2019-01-26 MED ORDER — POTASSIUM CHLORIDE 10 MEQ/100ML IV SOLN
10.0000 meq | INTRAVENOUS | Status: AC
  Administered 2019-01-26 (×2): 10 meq via INTRAVENOUS
  Filled 2019-01-26 (×2): qty 100

## 2019-01-26 MED ORDER — SODIUM CHLORIDE 0.9 % IV SOLN: INTRAVENOUS | Status: DC

## 2019-01-26 MED ORDER — PANTOPRAZOLE SODIUM 40 MG IV SOLR
40.0000 mg | Freq: Once | INTRAVENOUS | Status: AC
  Administered 2019-01-26: 40 mg via INTRAVENOUS
  Filled 2019-01-26: qty 40

## 2019-01-26 MED ORDER — SODIUM CHLORIDE 0.9 % IV SOLN
INTRAVENOUS | Status: DC
  Administered 2019-01-26 – 2019-01-29 (×2): via INTRAVENOUS

## 2019-01-26 MED ORDER — SODIUM CHLORIDE 0.9 % IV SOLN
1.0000 g | INTRAVENOUS | Status: DC
  Administered 2019-01-26: 1 g via INTRAVENOUS
  Filled 2019-01-26: qty 10

## 2019-01-26 MED ORDER — INSULIN REGULAR(HUMAN) IN NACL 100-0.9 UT/100ML-% IV SOLN
INTRAVENOUS | Status: DC
  Administered 2019-01-26: 5.4 [IU]/h via INTRAVENOUS
  Administered 2019-01-27: 4.2 [IU]/h via INTRAVENOUS
  Filled 2019-01-26 (×3): qty 100

## 2019-01-26 MED ORDER — DEXTROSE-NACL 5-0.45 % IV SOLN
INTRAVENOUS | Status: DC
  Administered 2019-01-28: 19:00:00 via INTRAVENOUS

## 2019-01-26 MED ORDER — SODIUM CHLORIDE 0.9% FLUSH
3.0000 mL | Freq: Once | INTRAVENOUS | Status: AC
  Administered 2019-01-26: 3 mL via INTRAVENOUS

## 2019-01-26 MED ORDER — LORAZEPAM 2 MG/ML IJ SOLN
1.0000 mg | Freq: Once | INTRAMUSCULAR | Status: DC
  Filled 2019-01-26: qty 1

## 2019-01-26 MED ORDER — ENOXAPARIN SODIUM 40 MG/0.4ML ~~LOC~~ SOLN
40.0000 mg | SUBCUTANEOUS | Status: DC
  Administered 2019-01-27: 40 mg via SUBCUTANEOUS
  Filled 2019-01-26: qty 0.4

## 2019-01-26 NOTE — ED Notes (Signed)
CBG-[id number]

## 2019-01-26 NOTE — ED Notes (Signed)
CBG reads HI. RN Tillson notified.

## 2019-01-26 NOTE — Progress Notes (Signed)
Curbside for [facility] finding of age indeterminate lacune. Work up for AMS: +DKA Recommend MRI brain w/o. Call back if positive for stroke  -- Amie Portland, MD [facility] Pager: [phone] If 7pm to 7am, please call on call as listed on AMION.

## 2019-01-26 NOTE — ED Notes (Signed)
CBG was 400.

## 2019-01-26 NOTE — ED Notes (Signed)
CBG - 310 

## 2019-01-26 NOTE — ED Notes (Signed)
Assumed care on patient , patient at MRI at this time with RN . Insulin drip infusing at 8.5 units/hr.

## 2019-01-26 NOTE — H&P (Signed)
History and Physical    Karen Stein [id number] DOB: 12/26/1970 DOA: 01/26/2019  PCP: [name], MD (Confirm with patient/family/NH records and if not entered, this has to be entered at [facility] point of entry) Patient coming from: Home  I have personally briefly reviewed patient's old medical records in [place]  Chief Complaint: weakness, nausea and vomiting, constipation  HPI: Karen Stein is a 48 y.o. female with medical history significant of type 2 diabetes, asthma, sickle cell trait, prolonged QT, anxiety, hyperlipidemia who presented with symptoms of weakness, nausea and vomiting for the past 5 days.  She denies any fevers or chills.  Denies any abdominal pain or diarrhea.  Denies any chest pain or shortness of breath.  She endorses she has been compliant with her 10 units of insulin daily and was still taking it during the times when she felt ill.  She denies any tobacco, alcohol or illicit drug use.  She reports that for the past several months she has also been having dental pain and has not seek any medical care for this.  ED Course: She was afebrile, hypertensive up to the 150s over 100 and tachycardic up to 120s.  She was ill-appearing and lethargic and had a difficult time providing an HPI without falling asleep.  Patient also was very thirsty and continued to request for ice water.  Her VBG on arrival showed a pH of 7.218.  Bicarb of 11 and anion gap of 29 with blood glucose of 727.  Her creatinine was also elevated to 2.01 from a prior of 0.79 five months ago.  Bilirubin mildly elevated 1.6 with normal LFTs.  BC showed leukocytosis of 20.5 and elevated hemoglobin of 18 and platelet of 407.  CT head was obtained due to her altered mental status and it revealed a rounded area of low attenuation in the right frontal lobe that may represent an age-indeterminate lacunar infarct.  CT abdomen pelvis was obtained by EDP since patient reportedly complained of constipation.   It revealed small hiatal hernia with distal esophageal wall thickening likely from reflux or esophagitis.  There is moderate stool burden throughout the colon.  There may be gallbladder sludge but no pericholecystic inflammation.   Review of Systems:  Difficulty obtaining for review of systems given lethargy. Past Medical History:  Diagnosis Date   Anemia    Anxiety    Arthritis    Chronic pain    Complication of anesthesia    anesthesia awareness,   DDD (degenerative disc disease)    DDD (degenerative disc disease)    Degenerative disc disease    Degenerative disc disease    Diabetes mellitus    Type 2 insulin resistant   Dysrhythmia    stress and caffeine related   Endometriosis    Fibroids    Fibromyalgia    GERD (gastroesophageal reflux disease)    High cholesterol    Insomnia    Leg cramps    PCOS (polycystic ovarian syndrome)    Scoliosis    Sickle cell trait ([place])     Past Surgical History:  Procedure Laterality Date   BACK SURGERY  2010   BIOPSY BREAST     CESAREAN SECTION     DILATATION & CURETTAGE/HYSTEROSCOPY WITH MYOSURE N/A 01/01/2015   Procedure: DILATATION & CURETTAGE/HYSTEROSCOPY WITH MYOSURE;  Surgeon: Crawford Givens, MD;  Location: [facility];  Service: Gynecology;  Laterality: N/A;   LAPAROSCOPY  2002 or 2003   TOE SURGERY  2008  WISDOM TOOTH EXTRACTION       reports that she has never smoked. She has never used smokeless tobacco. She reports that she does not drink alcohol or use drugs.  Allergies  Allergen Reactions   Macrobid [Nitrofurantoin Macrocrystal] Nausea Only    Headache and "severe abd cramps"   Shellfish Allergy Swelling    Itching of lips and ears, NOT allergic to iodine contrast   Sulfa Antibiotics Other (See Comments)    Dizzy and sees spots with sulfa drugs    Family History  Problem Relation Age of Onset   Diabetes Father    Hypertension Father    Hyperlipidemia Father    Kidney failure  Father    Diabetes Paternal Grandfather    Hypertension Paternal Grandfather    Hyperlipidemia Paternal Grandfather    Diabetes Paternal Grandmother    Hypertension Paternal Grandmother    Hyperlipidemia Paternal Grandmother    Alzheimer's disease Maternal Grandmother    Brain cancer Maternal Grandfather     Family history reviewed and not pertinent   Prior to Admission medications   Medication Sig Start Date End Date Taking? Authorizing Provider  atorvastatin (LIPITOR) 20 MG tablet Take 1 tablet (20 mg total) by mouth daily at 6 PM. 08/01/18   Black, Lezlie Octave, NP  DULoxetine (CYMBALTA) 30 MG capsule Take 30 mg by mouth at bedtime. 01/04/19   [provider]  empagliflozin (JARDIANCE) 25 MG TABS tablet Take 25 mg by mouth daily.    [provider]  gabapentin (NEURONTIN) 300 MG capsule Take 600 mg by mouth 3 (three) times daily. 06/17/18   [provider]  glimepiride (AMARYL) 4 MG tablet Take 4 mg by mouth daily. 11/15/18   [provider]  insulin glargine (LANTUS) 100 UNIT/ML injection Inject 0.1 mLs (10 Units total) into the skin daily. Patient taking differently: Inject 10 Units into the skin at bedtime.  08/01/18   Black, Lezlie Octave, NP  metoCLOPramide (REGLAN) 5 MG tablet Take 1 tablet (5 mg total) by mouth every 6 (six) hours as needed for nausea. Patient not taking: Reported on 08/08/2018 08/01/18   Radene Gunning, NP  metoprolol tartrate (LOPRESSOR) 25 MG tablet Take 1 tablet (25 mg total) by mouth 2 (two) times daily. Patient not taking: Reported on 01/17/2019 08/01/18   Radene Gunning, NP  oxycodone (ROXICODONE) 30 MG immediate release tablet Take 30 mg by mouth every 6 (six) hours as needed for pain. 01/04/19   [provider]  tiZANidine (ZANAFLEX) 4 MG tablet Take 4 mg by mouth 3 (three) times daily. 01/11/19   [provider]    Physical Exam: Vitals:   01/26/19 1815 01/26/19 1830 01/26/19 1900 01/26/19 1930  BP: (!) 141/85 (!)  151/87 (!) 150/98 (!) 158/103  Pulse: (!) 115 (!) 111 (!) 115 (!) 114  Resp: (!) 28 (!) 26 (!) 28 (!) 27  Temp:      TempSrc:      SpO2: 99% 100% 100% 100%  Weight:      Height:        Constitutional: Lethargic but awakes to voice . Ill-appearing obese female in obvious discomfort lying in bed and complaining of thirst Vitals:   01/26/19 1815 01/26/19 1830 01/26/19 1900 01/26/19 1930  BP: (!) 141/85 (!) 151/87 (!) 150/98 (!) 158/103  Pulse: (!) 115 (!) 111 (!) 115 (!) 114  Resp: (!) 28 (!) 26 (!) 28 (!) 27  Temp:      TempSrc:  SpO2: 99% 100% 100% 100%  Weight:      Height:       Eyes: PERRL, lids and conjunctivae normal ENMT: Mucous membranes are moist. Posterior pharynx clear of any exudate or lesions.poor dentition with missing teeth and dental caries throughout.  No obvious abscess noted to the left lower molars where patient was complaining of pain but surrounding gum notably edematous without erythema.  Neck: normal, supple, no masses Respiratory: clear to auscultation bilaterally, no wheezing, no crackles. Normal respiratory effort. No accessory muscle use. On room air.  Cardiovascular: Regular rate and rhythm, no murmurs / rubs / gallops. No extremity edema. 2+ pedal pulses. Abdomen: mild epigastric tenderness, no masses palpated. Bowel sounds positive.  Musculoskeletal: no clubbing / cyanosis. No joint deformity upper and lower extremities. Good ROM, no contractures. Normal muscle tone.  Skin: no rashes, lesions, ulcers. No induration Neurologic: CN 2-12 grossly intact. Sensation intact, DTR normal. Strength 5/5 in all 4.  Psychiatric: Lethargic    Labs on Admission: I have personally reviewed following labs and imaging studies  CBC: Recent Labs  Lab 01/26/19 1536 01/26/19 1619  WBC 20.5*  --   HGB 18.0* 18.7*  HCT 53.4* 55.0*  MCV 88.7  --   PLT 407*  --    Basic Metabolic Panel: Recent Labs  Lab 01/26/19 1536 01/26/19 1619  NA 139 137  K 4.0 4.0  CL  99  --   CO2 11*  --   GLUCOSE 727*  --   BUN 26*  --   CREATININE 2.01*  --   CALCIUM 9.3  --    GFR: Estimated Creatinine Clearance: 47.8 mL/min (A) (by C-G formula based on SCr of 2.01 mg/dL (H)). Liver Function Tests: Recent Labs  Lab 01/26/19 1536  AST 14*  ALT 16  ALKPHOS 113  BILITOT 1.6*  PROT 8.6*  ALBUMIN 3.9   Recent Labs  Lab 01/26/19 1536  LIPASE 17   No results for input(s): AMMONIA in the last 168 hours. Coagulation Profile: No results for input(s): INR, PROTIME in the last 168 hours. Cardiac Enzymes: No results for input(s): CKTOTAL, CKMB, CKMBINDEX, TROPONINI in the last 168 hours. BNP (last 3 results) No results for input(s): PROBNP in the last 8760 hours. HbA1C: No results for input(s): HGBA1C in the last 72 hours. CBG: Recent Labs  Lab 01/26/19 1533  GLUCAP >600*   Lipid Profile: No results for input(s): CHOL, HDL, LDLCALC, TRIG, CHOLHDL, LDLDIRECT in the last 72 hours. Thyroid Function Tests: No results for input(s): TSH, T4TOTAL, FREET4, T3FREE, THYROIDAB in the last 72 hours. Anemia Panel: No results for input(s): VITAMINB12, FOLATE, FERRITIN, TIBC, IRON, RETICCTPCT in the last 72 hours. Urine analysis:    Component Value Date/Time   COLORURINE STRAW (A) 01/26/2019 1900   APPEARANCEUR CLEAR 01/26/2019 1900   LABSPEC 1.017 01/26/2019 1900   PHURINE 5.0 01/26/2019 1900   GLUCOSEU >=500 (A) 01/26/2019 1900   HGBUR SMALL (A) 01/26/2019 1900   BILIRUBINUR NEGATIVE 01/26/2019 1900   KETONESUR 80 (A) 01/26/2019 1900   PROTEINUR NEGATIVE 01/26/2019 1900   UROBILINOGEN 0.2 02/17/2015 1155   NITRITE NEGATIVE 01/26/2019 1900   LEUKOCYTESUR NEGATIVE 01/26/2019 1900    Radiological Exams on Admission: Ct Head Wo Contrast  Result Date: 01/26/2019 CLINICAL DATA:  Altered mental status. EXAM: CT HEAD WITHOUT CONTRAST TECHNIQUE: Contiguous axial images were obtained from the base of the skull through the vertex without intravenous contrast.  COMPARISON:  None. FINDINGS: Brain: No subdural, epidural, or subarachnoid  hemorrhage. Cerebellum, brainstem, and basal cisterns are normal. Ventricles and sulci are unremarkable. Low-attenuation in the right frontal lobe adjacent to the right lateral ventricle frontal horna as seen on axial image 13 and coronal image 23 is identified. No acute cortical ischemia or infarct is identified. No mass effect or midline shift. Vascular: Calcified atherosclerosis is seen in the intracranial carotids. Skull: Normal. Negative for fracture or focal lesion. Sinuses/Orbits: There is debris in the sphenoid sinuses. Paranasal sinuses, mastoid air cells, and middle ears are otherwise normal. Other: None. IMPRESSION: 1. A rounded region of low attenuation in the right frontal lobe adjacent to the frontal horn of the right lateral ventricle may represent an age-indeterminate lacunar infarct. Recommend clinical correlation. No other acute intracranial abnormalities identified. Electronically Signed   By: Dorise Bullion III M.D   On: 01/26/2019 20:32    EKG: Independently reviewed.   Assessment/Plan   Altered mental status secondary to diabetic ketoacidosis -Possibly secondary to dental infection -pH of 7.218, bicarb of 11, anion gap 29, BG 727 on admission - replete potassium -Continue IV insulin gtt -Continue NS IV fluids - switch to D5-1/2 NS when BG <250  - keep NPO  Probable lacunar infarct -CT head obtained to evaluate altered mental status revealed low attenuation to her right frontal lobe that could be an age-indeterminate infarct.  Last known normal CT head in 2006. -Case discussed with neurologist on-call Dr. Rory Percy who recommended obtaining MRI brain without contrast.  If ischemia is acute, neurology will evaluate in the morning.  AKI - Likely secondary to DKA -Continue IV fluids -Avoid nephrotoxic agent  Leukocytosis - Likely reactive to DKA and also possibly due to dental infection for the past  few months -will obtain blood cultures x2. -will start on IV Rocephin  Elevated bilirubin -Mildly elevated on admission of 1.6.  Likely secondary to DKA -Continue to monitor with CMP  Chest pain -Pt endorsed mid-sternal CP shortly after admission and was vomiting brown and red emesis on evaluation - obtain Gastroccult to confirm Hematemesis -EKG obtained was unchanged from prior with sinus tachycardia without any ST or T wave changes.  Chest pain most likely secondary to hematemesis from esophagitis and nausea and vomiting.  Esophagitis/GERD - Esophageal wall thickening noted on CT abdomen.  Likely secondary to persistent nausea and vomiting. - will give IV PPI  OSA -Continue nighttime CPAP  Depression -stable.   DVT prophylaxis:.Lovenox Code Status:Full Family Communication: Plan discussed with patient at bedside  disposition Plan: Home with at least 2 midnight stays  Consults called:  Admission status: inpatient SDU   Everton Bertha T Venancio Chenier DO [facility]   If 7PM-7AM, please contact night-coverage [email or web address] Password [facility]  01/26/2019, 8:34 PM

## 2019-01-26 NOTE — ED Triage Notes (Signed)
Pt arrives from home via [facility] c/o constipation x 3 weeks with new n/v since Friday.  Pt reports hyperglycemia, CBG 220 per EMS.

## 2019-01-26 NOTE — ED Notes (Signed)
Pt husband Tarrell would like a call when pt is in room [phone]

## 2019-01-26 NOTE — ED Notes (Signed)
CBG read HI. RN Stone notified.

## 2019-01-26 NOTE — ED Notes (Signed)
Patient stuck multiple times and unable to get second set of blood cultures.

## 2019-01-26 NOTE — ED Provider Notes (Signed)
[facility] EMERGENCY DEPARTMENT Provider Note   CSN: [id number] Arrival date & time: 01/26/19  1514     History   Chief Complaint Chief Complaint  Patient presents with  . Emesis  . Constipation  . Hyperglycemia    HPI Karen Stein is a 48 y.o. female with a past medical history significant for diabetes mellitus type 2, fibromyalgia, depression, prolonged QT interval, depression, and anxiety who presents to the ED via EMS on 9/27 due to progressively worsening nausea and vomiting for the past 3 days. Vomiting is non-bloody and non-bilious. Patient admits to 20-30 episodes of vomiting for the past few days. Patient admits that she has been constipated for the past 3 weeks with no bowel movements at all. Patient has not taken anything at home for constipation or nausea/vomiting. Patient denies chest pain, shortness of breath, and fever. Patient also admits to not being compliant with home medication over the past few days. Level 5 caveat HPI may be incomplete due to altered mental status.  Past Medical History:  Diagnosis Date  . Anemia   . Anxiety   . Arthritis   . Chronic pain   . Complication of anesthesia    anesthesia awareness,  . DDD (degenerative disc disease)   . DDD (degenerative disc disease)   . Degenerative disc disease   . Degenerative disc disease   . Diabetes mellitus    Type 2 insulin resistant  . Dysrhythmia    stress and caffeine related  . Endometriosis   . Fibroids   . Fibromyalgia   . GERD (gastroesophageal reflux disease)   . High cholesterol   . Insomnia   . Leg cramps   . PCOS (polycystic ovarian syndrome)   . Scoliosis   . Sickle cell trait [facility])     Patient Active Problem List   Diagnosis Date Noted  . HLD (hyperlipidemia) 07/31/2018  . Prolonged QT interval 07/31/2018  . Chest pain 07/31/2018  . Nausea vomiting and diarrhea 07/31/2018  . Abdominal pain 07/31/2018  . Elevated blood protein 07/31/2018  . Diabetes  mellitus without complication ([place]) 0000000  . Anxiety   . Major depressive disorder, recurrent severe without psychotic features ([place]) 10/12/2015  . Degenerative disc disease   . Sickle cell trait ([place])   . DDD (degenerative disc disease), lumbar     Past Surgical History:  Procedure Laterality Date  . BACK SURGERY  2010  . BIOPSY BREAST    . CESAREAN SECTION    . DILATATION & CURETTAGE/HYSTEROSCOPY WITH MYOSURE N/A 01/01/2015   Procedure: DILATATION & CURETTAGE/HYSTEROSCOPY WITH MYOSURE;  Surgeon: Crawford Givens, MD;  Location: [facility];  Service: Gynecology;  Laterality: N/A;  . LAPAROSCOPY  2002 or 2003  . TOE SURGERY  2008  . WISDOM TOOTH EXTRACTION       OB History    Gravida  4   Para  2   Term  1   Preterm  1   AB  2   Living  1     SAB  2   TAB      Ectopic      Multiple      Live Births               Home Medications    Prior to Admission medications   Medication Sig Start Date End Date Taking? Authorizing Provider  atorvastatin (LIPITOR) 20 MG tablet Take 1 tablet (20 mg total) by mouth daily at 6 PM. 08/01/18  Black, Lezlie Octave, NP  DULoxetine (CYMBALTA) 30 MG capsule Take 30 mg by mouth at bedtime. 01/04/19   [provider]  empagliflozin (JARDIANCE) 25 MG TABS tablet Take 25 mg by mouth daily.    [provider]  gabapentin (NEURONTIN) 300 MG capsule Take 600 mg by mouth 3 (three) times daily. 06/17/18   [provider]  glimepiride (AMARYL) 4 MG tablet Take 4 mg by mouth daily. 11/15/18   [provider]  insulin glargine (LANTUS) 100 UNIT/ML injection Inject 0.1 mLs (10 Units total) into the skin daily. Patient taking differently: Inject 10 Units into the skin at bedtime.  08/01/18   Black, Lezlie Octave, NP  metoCLOPramide (REGLAN) 5 MG tablet Take 1 tablet (5 mg total) by mouth every 6 (six) hours as needed for nausea. Patient not taking: Reported on 08/08/2018 08/01/18   Radene Gunning, NP  metoprolol tartrate  (LOPRESSOR) 25 MG tablet Take 1 tablet (25 mg total) by mouth 2 (two) times daily. Patient not taking: Reported on 01/17/2019 08/01/18   Radene Gunning, NP  oxycodone (ROXICODONE) 30 MG immediate release tablet Take 30 mg by mouth every 6 (six) hours as needed for pain. 01/04/19   [provider]  tiZANidine (ZANAFLEX) 4 MG tablet Take 4 mg by mouth 3 (three) times daily. 01/11/19   [provider]    Family History Family History  Problem Relation Age of Onset  . Diabetes Father   . Hypertension Father   . Hyperlipidemia Father   . Kidney failure Father   . Diabetes Paternal Grandfather   . Hypertension Paternal Grandfather   . Hyperlipidemia Paternal Grandfather   . Diabetes Paternal Grandmother   . Hypertension Paternal Grandmother   . Hyperlipidemia Paternal Grandmother   . Alzheimer's disease Maternal Grandmother   . Brain cancer Maternal Grandfather     Social History Social History   Tobacco Use  . Smoking status: Never Smoker  . Smokeless tobacco: Never Used  Substance Use Topics  . Alcohol use: No    Alcohol/week: 0.0 standard drinks  . Drug use: No     Allergies   Macrobid [nitrofurantoin macrocrystal], Shellfish allergy, and Sulfa antibiotics   Review of Systems Review of Systems  Constitutional: Positive for appetite change (decreased appetite over past few weeks). Negative for chills and fever.  HENT: Negative for rhinorrhea and sore throat.   Eyes: Negative for visual disturbance.  Respiratory: Negative for cough, chest tightness and shortness of breath.   Cardiovascular: Negative for chest pain and palpitations.  Gastrointestinal: Positive for abdominal pain, constipation, nausea and vomiting. Negative for abdominal distention and diarrhea.  Genitourinary: Positive for dysuria. Negative for hematuria.  Musculoskeletal: Negative for myalgias.  Skin: Negative for color change and rash.  Neurological: Positive for weakness (generalized).  Negative for dizziness and light-headedness.     Physical Exam Updated Vital Signs BP (!) 150/98   Pulse (!) 115   Temp 97.6 F (36.4 C) (Oral)   Resp (!) 28   Ht 5\' 8"  (1.727 m)   Wt 127.9 kg   LMP 11/13/2015 (Exact Date)   SpO2 100%   BMI 42.88 kg/m   Physical Exam Vitals signs and nursing note reviewed.  Constitutional:      General: She is not in acute distress.    Appearance: She is obese. She is ill-appearing.     Comments: Somnolent  HENT:     Head: Normocephalic.     Mouth/Throat:     Mouth:  Mucous membranes are dry.  Eyes:     Pupils: Pupils are equal, round, and reactive to light.  Neck:     Musculoskeletal: Neck supple.  Cardiovascular:     Rate and Rhythm: Regular rhythm. Tachycardia present.     Pulses: Normal pulses.     Heart sounds: Normal heart sounds. No murmur. No friction rub. No gallop.   Pulmonary:     Breath sounds: Normal breath sounds. No wheezing, rhonchi or rales.     Comments: Tachypneic Abdominal:     General: Abdomen is flat. There is no distension.     Palpations: Abdomen is soft.     Tenderness: There is no abdominal tenderness. There is no guarding.     Comments: Bowel sounds hypoactive  Musculoskeletal: Normal range of motion.  Skin:    General: Skin is dry.  Neurological:     Comments: Patient appears mildly confused and somnolent.      ED Treatments / Results  Labs (all labs ordered are listed, but only abnormal results are displayed) Labs Reviewed  COMPREHENSIVE METABOLIC PANEL - Abnormal; Notable for the following components:      Result Value   CO2 11 (*)    Glucose, Bld 727 (*)    BUN 26 (*)    Creatinine, Ser 2.01 (*)    Total Protein 8.6 (*)    AST 14 (*)    Total Bilirubin 1.6 (*)    GFR calc non Af Amer 28 (*)    GFR calc Af Amer 33 (*)    Anion gap 29 (*)    All other components within normal limits  CBC - Abnormal; Notable for the following components:   WBC 20.5 (*)    RBC 6.02 (*)    Hemoglobin  18.0 (*)    HCT 53.4 (*)    Platelets 407 (*)    All other components within normal limits  URINALYSIS, ROUTINE W REFLEX MICROSCOPIC - Abnormal; Notable for the following components:   Color, Urine STRAW (*)    Glucose, UA >=500 (*)    Hgb urine dipstick SMALL (*)    Ketones, ur 80 (*)    Bacteria, UA RARE (*)    All other components within normal limits  CBG MONITORING, ED - Abnormal; Notable for the following components:   Glucose-Capillary >600 (*)    All other components within normal limits  POCT I-STAT EG7 - Abnormal; Notable for the following components:   pH, Ven 7.218 (*)    pCO2, Ven 27.1 (*)    pO2, Ven 164.0 (*)    Bicarbonate 11.0 (*)    TCO2 12 (*)    Acid-base deficit 15.0 (*)    Calcium, Ion 1.11 (*)    HCT 55.0 (*)    Hemoglobin 18.7 (*)    All other components within normal limits  SARS CORONAVIRUS 2 (HOSPITAL ORDER, [facility])  URINE CULTURE  LIPASE, BLOOD  I-STAT BETA HCG BLOOD, ED ([facility], [facility], [facility] ONLY)  I-STAT VENOUS BLOOD GAS, ED    EKG None  Radiology No results found.  Procedures Procedures (including critical care time)  Medications Ordered in ED Medications  insulin regular, human (MYXREDLIN) 100 units/ 100 mL infusion (5.4 Units/hr Intravenous New Bag/Given 01/26/19 1658)  sodium chloride 0.9 % bolus 1,000 mL (0 mLs Intravenous Stopped 01/26/19 1817)    And  sodium chloride 0.9 % bolus 1,000 mL (1,000 mLs Intravenous New Bag/Given 01/26/19 1822)    And  sodium chloride 0.9 % bolus 1,000 mL (has no administration in time range)    And  0.9 %  sodium chloride infusion (has no administration in time range)  dextrose 5 %-0.45 % sodium chloride infusion ( Intravenous Hold 01/26/19 1659)  sodium chloride flush (NS) 0.9 % injection 3 mL (3 mLs Intravenous Given 01/26/19 1717)  ondansetron (ZOFRAN-ODT) disintegrating tablet 4 mg (4 mg Oral Given 01/26/19 1526)  potassium chloride 10 mEq in 100 mL IVPB (10 mEq Intravenous New  Bag/Given 01/26/19 1823)  ondansetron (ZOFRAN) injection 4 mg (4 mg Intravenous Given 01/26/19 1706)     Initial Impression / Assessment and Plan / ED Course  I have reviewed the triage vital signs and the nursing notes.  Pertinent labs & imaging results that were available during my care of the patient were reviewed by me and considered in my medical decision making (see chart for details).  Ayaan Goodell is a 48 year old female who presents to the ED via EMS due to severe nausea/ vomiting for the past 3 days. Patient is a type 2 diabetic. Initial exam performed. Initial labs demonstrate patient is in DKA with an anion gap of 29. Patient is tachycardic to 120 and tachypneic. On physical exam, patient appears extremely ill, somnolent and is actively dry heaving. Her abdomen is soft, non-distended, and non-tender. She appears to be slightly confused. DKA orders were initiated. Patient has history of prolonged QT; however, QTc today was normal at 436. Will give patient zofran for nausea given her normal QT today.  Patient's labs indicated acute kidney injury with a creatinine of 2.0 and CO2 of 11. Patient's potassium is 4. Suspect potassium levels will decrease with insulin treatment. Will give potassium supplementation. Due to patient's mild confusion and altered mental status, CT of head will be obtained. CT of abdomen will be obtained due to acute kidney injury. Patient will be admitted on the floor for further treatment. Pre-admission labs obtained. CT of head demonstrated no acute changes that could cause the altered mental status.   Dr. Sabra Heck discussed care with hospitalists at 7:30. Agreed to admit patient.   Final Clinical Impressions(s) / ED Diagnoses   Final diagnoses:  Diabetic ketoacidosis without coma associated with type 2 diabetes mellitus ([place])  Acute renal failure, unspecified acute renal failure type [facility])  Dehydration  Metabolic acidosis    ED Discharge Orders    None        Romie Levee 01/26/19 2304    Noemi Chapel, MD 01/30/19 1941

## 2019-01-26 NOTE — ED Notes (Signed)
[id number]. Will not cross over onto Epic.

## 2019-01-26 NOTE — ED Notes (Signed)
Unable to get 2nd blood culture RN D. Williams informed.

## 2019-01-26 NOTE — ED Provider Notes (Signed)
This patient is a critically ill-appearing 48 year old female with a known history of diabetes.  She presents to the hospital with severe generalized weakness, persistent vomiting and a severely hyperglycemic state.  On exam the patient is tachycardic to 120, she is tachypneic, she has a very soft nontender abdomen.  I do not auscultate any murmurs or see any significant edema.  Her mouth is significantly dry.  She is slightly confused and mumbling intermittently.  I do not feel like I am getting a clear history, level 5 caveat applies to some altered mental status.  The patient has multiple lab abnormalities including an acute kidney injury with acute renal failure creatinine is 2, previous was 0.8.  She has a evidence of diabetic ketoacidosis with a carbon dioxide of 11, anion gap over 20 and an acidosis on her VBG.  Her potassium was 4, potassium supplementation will be given, insulin drip will be needed and multiple fluid boluses.  She does report to me that she is having multiple falls over the last 3 weeks, she cannot give me any further details.  Will obtain a CT scan of the brain as well as a noncontrast CT scan of the abdomen and pelvis due to renal failure to look for other causes of the patient's progressive decline.  I do not think she is being compliant with her medications which may only be part of the process.  Critical care provided.  The patient will need to be admitted to a higher level of care.   CT scan imaging  Serum labs  Urinalysis  Fluid resuscitation  Insulin drip  Antiemetics  .Critical Care Performed by: Noemi Chapel, MD Authorized by: Noemi Chapel, MD   Critical care provider statement:    Critical care time (minutes):  35   Critical care time was exclusive of:  Separately billable procedures and treating other patients and teaching time   Critical care was necessary to treat or prevent imminent or life-threatening deterioration of the following  conditions:  CNS failure or compromise, endocrine crisis and renal failure   Critical care was time spent personally by me on the following activities:  Blood draw for specimens, development of treatment plan with patient or surrogate, discussions with consultants, evaluation of patient's response to treatment, examination of patient, obtaining history from patient or surrogate, ordering and performing treatments and interventions, ordering and review of laboratory studies, ordering and review of radiographic studies, pulse oximetry, re-evaluation of patient's condition and review of old charts   I discussed the care with the hospitalist at 7:30 PM, they have been kind enough to admit to the hospital.  Labs show that the patient is diabetic ketoacidosis with a low CO2, elevated sugar, ketones in the urine, anion gap and an acute kidney injury.  She is hemoconcentrated with a hemoglobin of 18.7 and has a leukocytosis which at this time is nonspecific.  CT scan of the belly is pending.  Final diagnoses:  Diabetic ketoacidosis without coma associated with type 2 diabetes mellitus ([place])  Acute renal failure, unspecified acute renal failure type ([place])  Dehydration  Metabolic acidosis       Noemi Chapel, MD 01/30/19 [phone]

## 2019-01-26 NOTE — ED Notes (Addendum)
Patient is vomiting blood and complaining of chest pain. Hospitalist made aware.

## 2019-01-27 DIAGNOSIS — R112 Nausea with vomiting, unspecified: Secondary | ICD-10-CM

## 2019-01-27 DIAGNOSIS — K5900 Constipation, unspecified: Secondary | ICD-10-CM

## 2019-01-27 DIAGNOSIS — R748 Abnormal levels of other serum enzymes: Secondary | ICD-10-CM

## 2019-01-27 LAB — BASIC METABOLIC PANEL
Anion gap: 15 (ref 5–15)
Anion gap: 16 — ABNORMAL HIGH (ref 5–15)
Anion gap: 16 — ABNORMAL HIGH (ref 5–15)
Anion gap: 15 (ref 5–15)
Anion gap: 15 (ref 5–15)
BUN: 19 mg/dL (ref 6–20)
BUN: 19 mg/dL (ref 6–20)
BUN: 16 mg/dL (ref 6–20)
BUN: 14 mg/dL (ref 6–20)
BUN: 12 mg/dL (ref 6–20)
CO2: 16 mmol/L — ABNORMAL LOW (ref 22–32)
CO2: 15 mmol/L — ABNORMAL LOW (ref 22–32)
CO2: 14 mmol/L — ABNORMAL LOW (ref 22–32)
CO2: 16 mmol/L — ABNORMAL LOW (ref 22–32)
CO2: 19 mmol/L — ABNORMAL LOW (ref 22–32)
Calcium: 8.9 mg/dL (ref 8.9–10.3)
Calcium: 8.8 mg/dL — ABNORMAL LOW (ref 8.9–10.3)
Calcium: 8.9 mg/dL (ref 8.9–10.3)
Calcium: 8.6 mg/dL — ABNORMAL LOW (ref 8.9–10.3)
Calcium: 8.9 mg/dL (ref 8.9–10.3)
Chloride: 116 mmol/L — ABNORMAL HIGH (ref 98–111)
Chloride: 117 mmol/L — ABNORMAL HIGH (ref 98–111)
Chloride: 118 mmol/L — ABNORMAL HIGH (ref 98–111)
Chloride: 117 mmol/L — ABNORMAL HIGH (ref 98–111)
Chloride: 113 mmol/L — ABNORMAL HIGH (ref 98–111)
Creatinine, Ser: 1.33 mg/dL — ABNORMAL HIGH (ref 0.44–1.00)
Creatinine, Ser: 1.27 mg/dL — ABNORMAL HIGH (ref 0.44–1.00)
Creatinine, Ser: 1.26 mg/dL — ABNORMAL HIGH (ref 0.44–1.00)
Creatinine, Ser: 1.32 mg/dL — ABNORMAL HIGH (ref 0.44–1.00)
Creatinine, Ser: 1.15 mg/dL — ABNORMAL HIGH (ref 0.44–1.00)
GFR calc Af Amer: 54 mL/min — ABNORMAL LOW (ref >60)
GFR calc Af Amer: 57 mL/min — ABNORMAL LOW (ref >60)
GFR calc Af Amer: 58 mL/min — ABNORMAL LOW (ref >60)
GFR calc Af Amer: 55 mL/min — ABNORMAL LOW (ref >60)
GFR calc Af Amer: >60 mL/min (ref >60)
GFR calc non Af Amer: 47 mL/min — ABNORMAL LOW (ref >60)
GFR calc non Af Amer: 50 mL/min — ABNORMAL LOW (ref >60)
GFR calc non Af Amer: 50 mL/min — ABNORMAL LOW (ref >60)
GFR calc non Af Amer: 47 mL/min — ABNORMAL LOW (ref >60)
GFR calc non Af Amer: 56 mL/min — ABNORMAL LOW (ref >60)
Glucose, Bld: 148 mg/dL — ABNORMAL HIGH (ref 70–99)
Glucose, Bld: 164 mg/dL — ABNORMAL HIGH (ref 70–99)
Glucose, Bld: 127 mg/dL — ABNORMAL HIGH (ref 70–99)
Glucose, Bld: 205 mg/dL — ABNORMAL HIGH (ref 70–99)
Glucose, Bld: 99 mg/dL (ref 70–99)
Potassium: 3.8 mmol/L (ref 3.5–5.1)
Potassium: 3.9 mmol/L (ref 3.5–5.1)
Potassium: 3.8 mmol/L (ref 3.5–5.1)
Potassium: 4.3 mmol/L (ref 3.5–5.1)
Potassium: 3.4 mmol/L — ABNORMAL LOW (ref 3.5–5.1)
Sodium: 147 mmol/L — ABNORMAL HIGH (ref 135–145)
Sodium: 148 mmol/L — ABNORMAL HIGH (ref 135–145)
Sodium: 148 mmol/L — ABNORMAL HIGH (ref 135–145)
Sodium: 148 mmol/L — ABNORMAL HIGH (ref 135–145)
Sodium: 147 mmol/L — ABNORMAL HIGH (ref 135–145)

## 2019-01-27 LAB — COMPREHENSIVE METABOLIC PANEL
ALT: 14 U/L (ref 0–44)
AST: 16 U/L (ref 15–41)
Albumin: 3.1 g/dL — ABNORMAL LOW (ref 3.5–5.0)
Alkaline Phosphatase: 95 U/L (ref 38–126)
Anion gap: 18 — ABNORMAL HIGH (ref 5–15)
BUN: 19 mg/dL (ref 6–20)
CO2: 15 mmol/L — ABNORMAL LOW (ref 22–32)
Calcium: 8.9 mg/dL (ref 8.9–10.3)
Chloride: 116 mmol/L — ABNORMAL HIGH (ref 98–111)
Creatinine, Ser: 1.34 mg/dL — ABNORMAL HIGH (ref 0.44–1.00)
GFR calc Af Amer: 54 mL/min — ABNORMAL LOW (ref >60)
GFR calc non Af Amer: 46 mL/min — ABNORMAL LOW (ref >60)
Glucose, Bld: 190 mg/dL — ABNORMAL HIGH (ref 70–99)
Potassium: 4.1 mmol/L (ref 3.5–5.1)
Sodium: 149 mmol/L — ABNORMAL HIGH (ref 135–145)
Total Bilirubin: 1.4 mg/dL — ABNORMAL HIGH (ref 0.3–1.2)
Total Protein: 7 g/dL (ref 6.5–8.1)

## 2019-01-27 LAB — CBC WITH DIFFERENTIAL/PLATELET
Abs Immature Granulocytes: 0.1 10*3/uL — ABNORMAL HIGH (ref 0.00–0.07)
Basophils Absolute: 0.1 10*3/uL (ref 0.0–0.1)
Basophils Relative: 0 %
Eosinophils Absolute: 0 10*3/uL (ref 0.0–0.5)
Eosinophils Relative: 0 %
HCT: 47.3 % — ABNORMAL HIGH (ref 36.0–46.0)
Hemoglobin: 16 g/dL — ABNORMAL HIGH (ref 12.0–15.0)
Immature Granulocytes: 0 %
Lymphocytes Relative: 14 %
Lymphs Abs: 3.1 10*3/uL (ref 0.7–4.0)
MCH: 29.9 pg (ref 26.0–34.0)
MCHC: 33.8 g/dL (ref 30.0–36.0)
MCV: 88.2 fL (ref 80.0–100.0)
Monocytes Absolute: 1.7 10*3/uL — ABNORMAL HIGH (ref 0.1–1.0)
Monocytes Relative: 7 %
Neutro Abs: 17.8 10*3/uL — ABNORMAL HIGH (ref 1.7–7.7)
Neutrophils Relative %: 79 %
Platelets: 307 10*3/uL (ref 150–400)
RBC: 5.36 MIL/uL — ABNORMAL HIGH (ref 3.87–5.11)
RDW: 13.5 % (ref 11.5–15.5)
WBC: 22.8 10*3/uL — ABNORMAL HIGH (ref 4.0–10.5)
nRBC: 0 % (ref 0.0–0.2)

## 2019-01-27 LAB — GLUCOSE, CAPILLARY
Glucose-Capillary: 107 mg/dL — ABNORMAL HIGH (ref 70–99)
Glucose-Capillary: 113 mg/dL — ABNORMAL HIGH (ref 70–99)
Glucose-Capillary: 114 mg/dL — ABNORMAL HIGH (ref 70–99)
Glucose-Capillary: 142 mg/dL — ABNORMAL HIGH (ref 70–99)
Glucose-Capillary: 148 mg/dL — ABNORMAL HIGH (ref 70–99)
Glucose-Capillary: 156 mg/dL — ABNORMAL HIGH (ref 70–99)
Glucose-Capillary: 159 mg/dL — ABNORMAL HIGH (ref 70–99)
Glucose-Capillary: 164 mg/dL — ABNORMAL HIGH (ref 70–99)
Glucose-Capillary: 200 mg/dL — ABNORMAL HIGH (ref 70–99)
Glucose-Capillary: 265 mg/dL — ABNORMAL HIGH (ref 70–99)
Glucose-Capillary: 310 mg/dL — ABNORMAL HIGH (ref 70–99)
Glucose-Capillary: 344 mg/dL — ABNORMAL HIGH (ref 70–99)
Glucose-Capillary: 400 mg/dL — ABNORMAL HIGH (ref 70–99)
Glucose-Capillary: 580 mg/dL (ref 70–99)
Glucose-Capillary: >600 mg/dL (ref 70–99)
Glucose-Capillary: >600 mg/dL (ref 70–99)
Glucose-Capillary: 124 mg/dL — ABNORMAL HIGH (ref 70–99)
Glucose-Capillary: 138 mg/dL — ABNORMAL HIGH (ref 70–99)
Glucose-Capillary: 149 mg/dL — ABNORMAL HIGH (ref 70–99)
Glucose-Capillary: 206 mg/dL — ABNORMAL HIGH (ref 70–99)
Glucose-Capillary: 211 mg/dL — ABNORMAL HIGH (ref 70–99)
Glucose-Capillary: 217 mg/dL — ABNORMAL HIGH (ref 70–99)
Glucose-Capillary: 175 mg/dL — ABNORMAL HIGH (ref 70–99)
Glucose-Capillary: 101 mg/dL — ABNORMAL HIGH (ref 70–99)
Glucose-Capillary: 97 mg/dL (ref 70–99)
Glucose-Capillary: 117 mg/dL — ABNORMAL HIGH (ref 70–99)
Glucose-Capillary: 153 mg/dL — ABNORMAL HIGH (ref 70–99)

## 2019-01-27 LAB — URINE CULTURE: Culture: NO GROWTH

## 2019-01-27 MED ORDER — BISACODYL 10 MG RE SUPP
10.0000 mg | Freq: Every day | RECTAL | Status: DC | PRN
  Administered 2019-01-27: 10 mg via RECTAL
  Filled 2019-01-27: qty 1

## 2019-01-27 MED ORDER — ENOXAPARIN SODIUM 60 MG/0.6ML ~~LOC~~ SOLN
60.0000 mg | SUBCUTANEOUS | Status: DC
  Administered 2019-01-27 – 2019-01-28 (×2): 60 mg via SUBCUTANEOUS
  Filled 2019-01-27 (×2): qty 0.6

## 2019-01-27 MED ORDER — ONDANSETRON HCL 4 MG/2ML IJ SOLN
4.0000 mg | Freq: Four times a day (QID) | INTRAMUSCULAR | Status: AC | PRN
  Administered 2019-01-27 (×2): 4 mg via INTRAVENOUS
  Filled 2019-01-27 (×2): qty 2

## 2019-01-27 MED ORDER — PROMETHAZINE HCL 25 MG/ML IJ SOLN
12.5000 mg | Freq: Once | INTRAMUSCULAR | Status: AC
  Administered 2019-01-27: 12.5 mg via INTRAVENOUS
  Filled 2019-01-27: qty 1

## 2019-01-27 MED ORDER — PANTOPRAZOLE SODIUM 40 MG IV SOLR
40.0000 mg | Freq: Every day | INTRAVENOUS | Status: DC
  Administered 2019-01-28: 40 mg via INTRAVENOUS
  Filled 2019-01-27: qty 40

## 2019-01-27 MED ORDER — SODIUM CHLORIDE 0.9 % IV SOLN
3.0000 g | Freq: Four times a day (QID) | INTRAVENOUS | Status: DC
  Administered 2019-01-27 – 2019-01-29 (×8): 3 g via INTRAVENOUS
  Filled 2019-01-27 (×7): qty 8
  Filled 2019-01-27 (×2): qty 3
  Filled 2019-01-27 (×3): qty 8

## 2019-01-27 MED ORDER — PROMETHAZINE HCL 25 MG RE SUPP
25.0000 mg | Freq: Four times a day (QID) | RECTAL | Status: AC | PRN
  Administered 2019-01-28: 25 mg via RECTAL
  Filled 2019-01-27 (×2): qty 1

## 2019-01-27 NOTE — Progress Notes (Addendum)
Marland Kitchen  PROGRESS NOTE    Karen Stein  DHW:[id number] DOB: 1971-03-18 DOA: 01/26/2019 PCP: [name], MD   Brief Narrative:   Karen Stein is a 48 y.o. female with medical history significant of type 2 diabetes, asthma, sickle cell trait, prolonged QT, anxiety, hyperlipidemia who presented with symptoms of weakness, nausea and vomiting for the past 5 days.  She denies any fevers or chills.  Denies any abdominal pain or diarrhea.  Denies any chest pain or shortness of breath.  She endorses she has been compliant with her 10 units of insulin daily and was still taking it during the times when she felt ill.  She denies any tobacco, alcohol or illicit drug use.  She reports that for the past several months she has also been having dental pain and has not seek any medical care for this.  9/28: Giving one-word answers this AM. Minimally interacting with exam. Nursing reporting continued nausea.   Assessment & Plan:   Active Problems:   Depression   DKA (diabetic ketoacidoses) (HCC)   DKA N/V     - remains on fluids, insulin gtt; continue til gap close     - still with nausea; add PR phenergan     - continue BMP q4h  AKI     - fluids  Elevated bilirubin     - possible sludge seen on CT     - still with N/V     - AST/ALT, alk phos were all ok     - monitor for now  Constipation     - PR bisacodyl     - monitor  Esophagitis/GERD     - continue PPI  ?Dental Infection     - check panorex     - started on IV rocephin; will switch to unasyn if we are trying to cover oral bugs     - Bld Cx obtained  Don't have a clear reason for her DKA. Question if dental infection is driving this, but she is giving limited interaction. Check panorex. Consult dental if necessary. Add phenergan for N. Bisacodyl for constipation. Wean from insulin gtt as able. Not tolerating PO, so continue IV meds for now.  DVT prophylaxis: lovenox Code Status: FULL   Disposition Plan:  TBD  Antimicrobials:   Rocephin   ROS:  Denies CP, dyspnea. Reports N/V. Remainder 10-pt ROS is negative for all not previously mentioned.  Subjective: Per nursing, still nauseous ON.  Objective: Vitals:   01/27/19 0330 01/27/19 0500 01/27/19 0630 01/27/19 0810  BP: 121/73 135/67 127/69 (!) 157/91  Pulse: (!) 125 (!) 112 (!) 120 (!) 108  Resp: (!) _0 Temp:    98.6 F (37 C)  TempSrc:    Oral  SpO2: 97% 98% 97% 100%  Weight:      Height:        Intake/Output Summary (Last 24 hours) at 01/27/2019 1138 Last data filed at 01/26/2019 2015 Gross per 24 hour  Intake 2191.85 ml  Output --  Net 2191.85 ml   Filed Weights   01/26/19 1644  Weight: 127.9 kg    Examination:  General: 48 y.o. female resting in bed in NAD Eyes: PERRL, normal sclera ENMT: Nares patent w/o discharge, ears w/o discharge/lesions/ulcers Cardiovascular: tachy, +S1, S2, no m/g/r, equal pulses throughout Respiratory: CTABL, no w/r/r, normal WOB GI: BS+, NDNT, no masses noted, no organomegaly noted MSK: No e/c/c Skin: No rashes, bruises, ulcerations noted Neuro:alert to name, following  some commands   Data Reviewed: I have personally reviewed following labs and imaging studies.  CBC: Recent Labs  Lab 01/26/19 1536 01/26/19 1619 01/26/19 2240  WBC 20.5*  --  24.5*  HGB 18.0* 18.7* 16.4*  HCT 53.4* 55.0* 48.1*  MCV 88.7  --  88.3  PLT 407*  --  751   Basic Metabolic Panel: Recent Labs  Lab 01/26/19 1536 01/26/19 1619 01/26/19 2240 01/27/19 0120 01/27/19 0606 01/27/19 0820  NA 139 137 142 147* 149* 148*  K 4.0 4.0 3.6 3.8 4.1 3.9  CL 99  --  112* 116* 116* 117*  CO2 11*  --  9* 16* 15* 15*  GLUCOSE 727*  --  263* 148* 190* 164*  BUN 26*  --  22* _0 CREATININE 2.01*  --  1.57* 1.33* 1.34* 1.27*  CALCIUM 9.3  --  8.4* 8.9 8.9 8.8*   GFR: Estimated Creatinine Clearance: 75.7 mL/min (A) (by C-G formula based on SCr of 1.27 mg/dL (H)). Liver Function Tests: Recent  Labs  Lab 01/26/19 1536 01/27/19 0606  AST 14* 16  ALT 16 14  ALKPHOS 113 95  BILITOT 1.6* 1.4*  PROT 8.6* 7.0  ALBUMIN 3.9 3.1*   Recent Labs  Lab 01/26/19 1536  LIPASE 17   No results for input(s): AMMONIA in the last 168 hours. Coagulation Profile: No results for input(s): INR, PROTIME in the last 168 hours. Cardiac Enzymes: No results for input(s): CKTOTAL, CKMB, CKMBINDEX, TROPONINI in the last 168 hours. BNP (last 3 results) No results for input(s): PROBNP in the last 8760 hours. HbA1C: No results for input(s): HGBA1C in the last 72 hours. CBG: Recent Labs  Lab 01/27/19 0512 01/27/19 0623 01/27/19 0744 01/27/19 0912 01/27/19 1022  GLUCAP 159* 200* 156* 142* 107*   Lipid Profile: No results for input(s): CHOL, HDL, LDLCALC, TRIG, CHOLHDL, LDLDIRECT in the last 72 hours. Thyroid Function Tests: No results for input(s): TSH, T4TOTAL, FREET4, T3FREE, THYROIDAB in the last 72 hours. Anemia Panel: No results for input(s): VITAMINB12, FOLATE, FERRITIN, TIBC, IRON, RETICCTPCT in the last 72 hours. Sepsis Labs: No results for input(s): PROCALCITON, LATICACIDVEN in the last 168 hours.  Recent Results (from the past 240 hour(s))  SARS Coronavirus 2 [facility] order, Performed in [facility] hospital lab) Nasopharyngeal Nasopharyngeal Swab     Status: None   Collection Time: 01/26/19  5:36 PM   Specimen: Nasopharyngeal Swab  Result Value Ref Range Status   SARS Coronavirus 2 NEGATIVE NEGATIVE Final    Comment: (NOTE) If result is NEGATIVE SARS-CoV-2 target nucleic acids are NOT DETECTED. The SARS-CoV-2 RNA is generally detectable in upper and lower  respiratory specimens during the acute phase of infection. The lowest  concentration of SARS-CoV-2 viral copies this assay can detect is 250  copies / mL. A negative result does not preclude SARS-CoV-2 infection  and should not be used as the sole basis for treatment or other  patient management decisions.  A negative  result may occur with  improper specimen collection / handling, submission of specimen other  than nasopharyngeal swab, presence of viral mutation(s) within the  areas targeted by this assay, and inadequate number of viral copies  (<250 copies / mL). A negative result must be combined with clinical  observations, patient history, and epidemiological information. If result is POSITIVE SARS-CoV-2 target nucleic acids are DETECTED. The SARS-CoV-2 RNA is generally detectable in upper and lower  respiratory specimens dur ing the acute phase of infection.  Positive  results are indicative of active infection with SARS-CoV-2.  Clinical  correlation with patient history and other diagnostic information is  necessary to determine patient infection status.  Positive results do  not rule out bacterial infection or co-infection with other viruses. If result is PRESUMPTIVE POSTIVE SARS-CoV-2 nucleic acids MAY BE PRESENT.   A presumptive positive result was obtained on the submitted specimen  and confirmed on repeat testing.  While 2019 novel coronavirus  (SARS-CoV-2) nucleic acids may be present in the submitted sample  additional confirmatory testing may be necessary for epidemiological  and / or clinical management purposes  to differentiate between  SARS-CoV-2 and other Sarbecovirus currently known to infect humans.  If clinically indicated additional testing with an alternate test  methodology [phone]) is advised. The SARS-CoV-2 RNA is generally  detectable in upper and lower respiratory sp ecimens during the acute  phase of infection. The expected result is Negative. Fact Sheet for Patients:  StrictlyIdeas.no Fact Sheet for Healthcare Providers: BankingDealers.co.za This test is not yet approved or cleared by the [place] FDA and has been authorized for detection and/or diagnosis of SARS-CoV-2 by FDA under an Emergency Use Authorization  (EUA).  This EUA will remain in effect (meaning this test can be used) for the duration of the COVID-19 declaration under Section 564(b)(1) of the Act, 21 U.S.C. section 360bbb-3(b)(1), unless the authorization is terminated or revoked sooner. Performed at [facility], [place] [address], [place], [place] [address]   Culture, blood (routine x 2)     Status: None (Preliminary result)   Collection Time: 01/26/19 10:40 PM   Specimen: BLOOD  Result Value Ref Range Status   Specimen Description BLOOD LEFT ANTECUBITAL  Final   Special Requests   Final    BOTTLES DRAWN AEROBIC AND ANAEROBIC Blood Culture adequate volume   Culture   Final    NO GROWTH < 12 HOURS Performed at [facility], [place] [address], [place], [place] [address]    Report Status PENDING  Incomplete      Radiology Studies: Ct Abdomen Pelvis Wo Contrast  Result Date: 01/26/2019 CLINICAL DATA:  Acute renal failure. Patient complains of constipation for 3 weeks. Nausea and vomiting. EXAM: CT ABDOMEN AND PELVIS WITHOUT CONTRAST TECHNIQUE: Multidetector CT imaging of the abdomen and pelvis was performed following the standard protocol without IV contrast. COMPARISON:  CT 07/31/2018 FINDINGS: Lower chest: Normal heart size. No pleural effusion. No acute airspace disease. Small hiatal hernia with distal esophageal wall thickening. Hepatobiliary: No focal hepatic abnormality allowing for lack contrast. High-density gallbladder contents may be secondary to sludge. No calcified gallstone. No pericholecystic inflammation. No biliary dilatation. Pancreas: No ductal dilatation or inflammation. Spleen: Normal in size without focal abnormality. Adrenals/Urinary Tract: No adrenal nodule. No hydronephrosis. No significant perinephric edema. Both ureters are decompressed. Urinary bladder is distended. No bladder wall thickening. No bladder stone. Stomach/Bowel: Small hiatal hernia. There is wall thickening of the distal esophagus, new from  prior exam. Stomach physiologically distended. Appendix appears normal. No evidence of bowel wall thickening, distention, or inflammatory changes. Moderate stool burden throughout the colon. There is stool in the rectum without abnormal rectal distention. Vascular/Lymphatic: Mild aorta bi-iliac atherosclerosis. No aneurysm. No enlarged lymph nodes in the abdomen or pelvis. Reproductive: Uterus and bilateral adnexa are unremarkable. Other: No free air, free fluid, or intra-abdominal fluid collection. Musculoskeletal: Degenerative and postsurgical change in the spine. There are no acute or suspicious osseous abnormalities. IMPRESSION: 1. No renal stones  or obstructive uropathy. Distended urinary bladder without bladder wall thickening. 2. Small hiatal hernia with distal esophageal wall thickening, can be seen with reflux or esophagitis. 3. Moderate stool burden throughout the colon. 4. High-density material in the gallbladder is likely sludge. No calcified stone. Aortic Atherosclerosis (ICD10-I70.0). Electronically Signed   By: Keith Rake M.D.   On: 01/26/2019 20:35   Ct Head Wo Contrast  Result Date: 01/26/2019 CLINICAL DATA:  Altered mental status. EXAM: CT HEAD WITHOUT CONTRAST TECHNIQUE: Contiguous axial images were obtained from the base of the skull through the vertex without intravenous contrast. COMPARISON:  None. FINDINGS: Brain: No subdural, epidural, or subarachnoid hemorrhage. Cerebellum, brainstem, and basal cisterns are normal. Ventricles and sulci are unremarkable. Low-attenuation in the right frontal lobe adjacent to the right lateral ventricle frontal horna as seen on axial image 13 and coronal image 23 is identified. No acute cortical ischemia or infarct is identified. No mass effect or midline shift. Vascular: Calcified atherosclerosis is seen in the intracranial carotids. Skull: Normal. Negative for fracture or focal lesion. Sinuses/Orbits: There is debris in the sphenoid sinuses.  Paranasal sinuses, mastoid air cells, and middle ears are otherwise normal. Other: None. IMPRESSION: 1. A rounded region of low attenuation in the right frontal lobe adjacent to the frontal horn of the right lateral ventricle may represent an age-indeterminate lacunar infarct. Recommend clinical correlation. No other acute intracranial abnormalities identified. Electronically Signed   By: Dorise Bullion III M.D   On: 01/26/2019 20:32   Mr Brain Wo Contrast  Result Date: 01/27/2019 CLINICAL DATA:  48 year old female with altered mental status. Possible age indeterminate right frontal lobe lacunar infarct on head CT earlier tonight. History of sickle cell trait. EXAM: MRI HEAD WITHOUT CONTRAST TECHNIQUE: Multiplanar, multiecho pulse sequences of the brain and surrounding structures were obtained without intravenous contrast. COMPARISON:  Head CT 01/26/2019. FINDINGS: Brain: Scaphocephaly. No restricted diffusion to suggest acute infarction. No midline shift, mass effect, evidence of mass lesion, ventriculomegaly, extra-axial collection or acute intracranial hemorrhage. Cervicomedullary junction and pituitary are within normal limits. Chronic T2 and FLAIR hyperintensity adjacent to the right caudate and frontal horn corresponding to the CT finding compatible with remote small vessel infarct. No associated hemosiderin. Elsewhere Pearline Cables and white matter signal is within normal limits throughout the brain. No cortical encephalomalacia or chronic blood products identified. Vascular: Major intracranial vascular flow voids are preserved. Tortuous vertebrobasilar system. Skull and upper cervical spine: Heterogeneous decreased T1 bone marrow signal, although bone mineralization was within normal limits by CT. Otherwise negative visible cervical spine. Sinuses/Orbits: Negative orbits. Mild fluid/bubbly opacity in the right sphenoid sinus. Trace paranasal sinus mucosal thickening otherwise. Other: Mastoids are clear. Grossly  negative internal auditory structures. Scalp and face soft tissues appear negative. IMPRESSION: 1. No acute intracranial abnormality. The CT finding corresponds to chronic small vessel ischemia in the white matter adjacent to the right caudate and frontal horn. 2. Heterogeneously decreased bone marrow signal, perhaps related to sickle cell trait in this clinical setting. Electronically Signed   By: Genevie Ann M.D.   On: 01/27/2019 00:20     Scheduled Meds:  enoxaparin (LOVENOX) injection  40 mg Subcutaneous Q24H   [START ON 01/28/2019] pantoprazole (PROTONIX) IV  40 mg Intravenous QHS   promethazine  12.5 mg Intravenous Once   Continuous Infusions:  sodium chloride Stopped (01/27/19 0024)   sodium chloride     cefTRIAXone (ROCEPHIN)  IV Stopped (01/26/19 2335)   dextrose 5 % and 0.45% NaCl 100 mL/hr at  01/27/19 0023   dextrose 5 % and 0.45% NaCl Stopped (01/26/19 2157)   insulin 0.9 Units/hr (01/27/19 1036)     LOS: 1 day    Time spent: 35 minutes spent in the coordination of care today.    Jonnie Finner, DO [facility] Pager [phone]  If 7PM-7AM, please contact night-coverage [email or web address] Password [facility] 01/27/2019, 11:38 AM   Much more interactive this afternoon and engaged. Spoke with husband at bedside and he says she is at her baseline. Still, gap isn't closed and she is still acidotic. Continue insulin gtt. Changing abx to unasyn to cover for oral bacteria. Can try repeat of panorex in AM.

## 2019-01-27 NOTE — ED Notes (Signed)
CBG 114 

## 2019-01-27 NOTE — ED Notes (Signed)
CBG=159  

## 2019-01-27 NOTE — ED Notes (Signed)
CBG 200 

## 2019-01-27 NOTE — ED Notes (Signed)
CBG: 113 °

## 2019-01-27 NOTE — Progress Notes (Signed)
   01/27/19 0810  Vitals  Temp 98.6 F (37 C)  Temp Source Oral  BP (!) 157/91  MAP (mmHg) 113  BP Location Right Arm  BP Method Automatic  Patient Position (if appropriate) Lying  Pulse Rate (!) 108  Resp 18  Oxygen Therapy  SpO2 100 %  O2 Device Room Air  Pain Assessment  Pain Scale 0-10  Pain Score 1  Glasgow Coma Scale  Eye Opening 4  Best Verbal Response (NON-intubated) 5  Best Motor Response 6  Glasgow Coma Scale Score 15  Level of Consciousness  Level of Consciousness Alert  MEWS Score  MEWS RR 0  MEWS Pulse 1  MEWS Systolic 0  MEWS LOC 0  MEWS Temp 0  MEWS Score 1  MEWS Score Color Green   Karen Stein is a 48 y.o. female patient admitted from ED via stretcher at 0800 awake, alert - oriented  X 4 - no acute distress noted.  Pt is very lethargic and sleepy.  IV in place, occlusive dsg intact without redness or swelling.  Has insulin and D51/2NS infusing.  Pt. Is only answering me `in short sentences.  She is not very interactive.  Says she is not feeling well,  She told me she had to use the bathroom, but she looks very weak so I allowed her to use the bedpan.  She was able to void 400 ml into bedpan.  She also told me she was feeling nauseated, So I medicated her with Zofran 4mg  IV.    Orientation to room, and floor completed with information packet given to patient/family.  Patient declined safety video at this time.  Admission INP armband ID verified with patient/family, and in place.  SR up x 2, fall assessment complete, call light within reach.  Skin, clean-dry- intact without evidence of bruising, or skin tears.  No evidence of skin break down noted on exam.     Will cont to eval and treat per MD orders.

## 2019-01-27 NOTE — Plan of Care (Signed)
  Problem: Metabolic: Goal: Ability to maintain appropriate glucose levels will improve Outcome: Progressing   Problem: Coping: Goal: Ability to adjust to condition or change in health will improve Outcome: Not Progressing   Problem: Nutritional: Goal: Maintenance of adequate nutrition will improve Outcome: Not Progressing

## 2019-01-27 NOTE — Progress Notes (Signed)
Patient stated that she does use a CPAP at home, but refused to use one of ours here at the hospital.  No distress was noted.  Will continue to monitor.

## 2019-01-27 NOTE — ED Notes (Signed)
Updated admitting MD on pt.'s CBG results and insulin drip rate , advised RN to continue insulin drip at this time .

## 2019-01-28 ENCOUNTER — Inpatient Hospital Stay (HOSPITAL_COMMUNITY): Payer: BC Managed Care – PPO

## 2019-01-28 LAB — BASIC METABOLIC PANEL
Anion gap: 17 — ABNORMAL HIGH (ref 5–15)
Anion gap: 11 (ref 5–15)
Anion gap: 13 (ref 5–15)
Anion gap: 7 (ref 5–15)
BUN: 11 mg/dL (ref 6–20)
BUN: 7 mg/dL (ref 6–20)
BUN: 7 mg/dL (ref 6–20)
BUN: <5 mg/dL — ABNORMAL LOW (ref 6–20)
CO2: 11 mmol/L — ABNORMAL LOW (ref 22–32)
CO2: 20 mmol/L — ABNORMAL LOW (ref 22–32)
CO2: 17 mmol/L — ABNORMAL LOW (ref 22–32)
CO2: 21 mmol/L — ABNORMAL LOW (ref 22–32)
Calcium: 8.5 mg/dL — ABNORMAL LOW (ref 8.9–10.3)
Calcium: 8.5 mg/dL — ABNORMAL LOW (ref 8.9–10.3)
Calcium: 8.1 mg/dL — ABNORMAL LOW (ref 8.9–10.3)
Calcium: 7.6 mg/dL — ABNORMAL LOW (ref 8.9–10.3)
Chloride: 118 mmol/L — ABNORMAL HIGH (ref 98–111)
Chloride: 114 mmol/L — ABNORMAL HIGH (ref 98–111)
Chloride: 110 mmol/L (ref 98–111)
Chloride: 110 mmol/L (ref 98–111)
Creatinine, Ser: 1.13 mg/dL — ABNORMAL HIGH (ref 0.44–1.00)
Creatinine, Ser: 0.96 mg/dL (ref 0.44–1.00)
Creatinine, Ser: 0.96 mg/dL (ref 0.44–1.00)
Creatinine, Ser: 0.76 mg/dL (ref 0.44–1.00)
GFR calc Af Amer: >60 mL/min (ref >60)
GFR calc Af Amer: >60 mL/min (ref >60)
GFR calc Af Amer: >60 mL/min (ref >60)
GFR calc Af Amer: >60 mL/min (ref >60)
GFR calc non Af Amer: 57 mL/min — ABNORMAL LOW (ref >60)
GFR calc non Af Amer: >60 mL/min (ref >60)
GFR calc non Af Amer: >60 mL/min (ref >60)
GFR calc non Af Amer: >60 mL/min (ref >60)
Glucose, Bld: 193 mg/dL — ABNORMAL HIGH (ref 70–99)
Glucose, Bld: 137 mg/dL — ABNORMAL HIGH (ref 70–99)
Glucose, Bld: 213 mg/dL — ABNORMAL HIGH (ref 70–99)
Glucose, Bld: 143 mg/dL — ABNORMAL HIGH (ref 70–99)
Potassium: 4.9 mmol/L (ref 3.5–5.1)
Potassium: 3.2 mmol/L — ABNORMAL LOW (ref 3.5–5.1)
Potassium: 4.2 mmol/L (ref 3.5–5.1)
Potassium: 2.8 mmol/L — ABNORMAL LOW (ref 3.5–5.1)
Sodium: 146 mmol/L — ABNORMAL HIGH (ref 135–145)
Sodium: 145 mmol/L (ref 135–145)
Sodium: 140 mmol/L (ref 135–145)
Sodium: 138 mmol/L (ref 135–145)

## 2019-01-28 LAB — CBC
HCT: 38.2 % (ref 36.0–46.0)
Hemoglobin: 13.3 g/dL (ref 12.0–15.0)
MCH: 30.4 pg (ref 26.0–34.0)
MCHC: 34.8 g/dL (ref 30.0–36.0)
MCV: 87.2 fL (ref 80.0–100.0)
Platelets: 234 10*3/uL (ref 150–400)
RBC: 4.38 MIL/uL (ref 3.87–5.11)
RDW: 13.2 % (ref 11.5–15.5)
WBC: 11.1 10*3/uL — ABNORMAL HIGH (ref 4.0–10.5)
nRBC: 0 % (ref 0.0–0.2)

## 2019-01-28 LAB — GLUCOSE, CAPILLARY
Glucose-Capillary: 153 mg/dL — ABNORMAL HIGH (ref 70–99)
Glucose-Capillary: 222 mg/dL — ABNORMAL HIGH (ref 70–99)
Glucose-Capillary: 194 mg/dL — ABNORMAL HIGH (ref 70–99)
Glucose-Capillary: 169 mg/dL — ABNORMAL HIGH (ref 70–99)
Glucose-Capillary: 119 mg/dL — ABNORMAL HIGH (ref 70–99)
Glucose-Capillary: 121 mg/dL — ABNORMAL HIGH (ref 70–99)
Glucose-Capillary: 164 mg/dL — ABNORMAL HIGH (ref 70–99)
Glucose-Capillary: 177 mg/dL — ABNORMAL HIGH (ref 70–99)
Glucose-Capillary: 179 mg/dL — ABNORMAL HIGH (ref 70–99)
Glucose-Capillary: 208 mg/dL — ABNORMAL HIGH (ref 70–99)
Glucose-Capillary: 221 mg/dL — ABNORMAL HIGH (ref 70–99)
Glucose-Capillary: 176 mg/dL — ABNORMAL HIGH (ref 70–99)
Glucose-Capillary: 128 mg/dL — ABNORMAL HIGH (ref 70–99)
Glucose-Capillary: 136 mg/dL — ABNORMAL HIGH (ref 70–99)
Glucose-Capillary: 156 mg/dL — ABNORMAL HIGH (ref 70–99)
Glucose-Capillary: 182 mg/dL — ABNORMAL HIGH (ref 70–99)
Glucose-Capillary: 174 mg/dL — ABNORMAL HIGH (ref 70–99)
Glucose-Capillary: 138 mg/dL — ABNORMAL HIGH (ref 70–99)

## 2019-01-28 MED ORDER — ACETAMINOPHEN 325 MG PO TABS
650.0000 mg | ORAL_TABLET | Freq: Four times a day (QID) | ORAL | Status: DC | PRN
  Administered 2019-01-28 (×2): 650 mg via ORAL
  Filled 2019-01-28 (×2): qty 2

## 2019-01-28 MED ORDER — OXYCODONE HCL 5 MG PO TABS
30.0000 mg | ORAL_TABLET | Freq: Four times a day (QID) | ORAL | Status: DC | PRN
  Administered 2019-01-28 – 2019-01-29 (×5): 30 mg via ORAL
  Filled 2019-01-28 (×5): qty 6

## 2019-01-28 MED ORDER — POTASSIUM CHLORIDE CRYS ER 20 MEQ PO TBCR
40.0000 meq | EXTENDED_RELEASE_TABLET | ORAL | Status: AC
  Administered 2019-01-28 – 2019-01-29 (×2): 40 meq via ORAL
  Filled 2019-01-28 (×2): qty 2

## 2019-01-28 MED ORDER — MUSCLE RUB 10-15 % EX CREA
TOPICAL_CREAM | CUTANEOUS | Status: DC | PRN
  Administered 2019-01-28 – 2019-01-29 (×2): via TOPICAL
  Filled 2019-01-28: qty 85

## 2019-01-28 MED ORDER — INSULIN ASPART 100 UNIT/ML ~~LOC~~ SOLN
0.0000 [IU] | Freq: Three times a day (TID) | SUBCUTANEOUS | Status: DC
  Administered 2019-01-29: 3 [IU] via SUBCUTANEOUS
  Administered 2019-01-29: 5 [IU] via SUBCUTANEOUS

## 2019-01-28 MED ORDER — INSULIN GLARGINE 100 UNIT/ML ~~LOC~~ SOLN
15.0000 [IU] | Freq: Every day | SUBCUTANEOUS | Status: DC
  Administered 2019-01-28: 15 [IU] via SUBCUTANEOUS
  Filled 2019-01-28 (×2): qty 0.15

## 2019-01-28 MED ORDER — POTASSIUM CHLORIDE 10 MEQ/100ML IV SOLN
10.0000 meq | INTRAVENOUS | Status: AC
  Administered 2019-01-28 (×4): 10 meq via INTRAVENOUS
  Filled 2019-01-28 (×4): qty 100

## 2019-01-28 MED ORDER — LACTATED RINGERS IV BOLUS
2000.0000 mL | Freq: Once | INTRAVENOUS | Status: AC
  Administered 2019-01-28: 2000 mL via INTRAVENOUS

## 2019-01-28 MED ORDER — SODIUM CHLORIDE 0.9% FLUSH: 10.0000 mL | INTRAVENOUS | Status: DC | PRN

## 2019-01-28 NOTE — Progress Notes (Addendum)
PROGRESS NOTE    Karen Stein  [id number] DOB: 1970-08-14 DOA: 01/26/2019 PCP: [name], MD   Brief Narrative: Karen Stein is a 48 y.o. femalewith medical history significant oftype 2 diabetes, asthma, sickle cell trait, prolonged QT, anxiety, hyperlipidemia. Patient presented secondary to weakness/nausea/vomiting and found to be in DKA. She has been started in insulin drip.   Assessment & Plan:   Active Problems:   Depression   DKA (diabetic ketoacidoses) ([place])   DKA Unsure of etiology. Per patient she has been adherent with insulin therapy. Anion gap closed with IV insulin but appears to be increasing now. She has associated worsening acidosis. -Continue Insulin drip -BMP q4 hours -Transition to Lantus 15 units with diet order and at least 2 hour IV/subq insulin overlap  Possible dental infection Does not look overly infected today but patient has been receiving antibiotics. Orthopantogram recommended but not performed yesterday secondary to patient intolerance -Continue Unasyn -Orthopantogram  AKI Resolved with IV fluids.  Nausea/vomiting -Continue Zofran prn  Esophagitis/GERD -Continue protonix  Hyperbilirubinemia Mild increase. -Repeat CMP   DVT prophylaxis: Lovenox Code Status:   Code Status: Full Code Family Communication: None Disposition Plan: Discharge pending transition to subcutaneous insulin    Consultants:   None  Procedures:   None  Antimicrobials:  Unasyn    Subjective: Patient is in pain from fibromyalgia. She is eager to eat. Nauseated.  Objective: Vitals:   01/27/19 2342 01/28/19 0326 01/28/19 0409 01/28/19 0830  BP: (!) 152/86  (!) 152/67 (!) 170/84  Pulse: (!) 108  96 93  Resp: 14  16 14   Temp: 99.1 F (37.3 C)  98.4 F (36.9 C) 98.6 F (37 C)  TempSrc: Oral   Oral  SpO2: 100%  100% 100%  Weight:  89.7 kg    Height:        Intake/Output Summary (Last 24 hours) at 01/28/2019 1104 Last data  filed at 01/27/2019 1845 Gross per 24 hour  Intake 335.73 ml  Output 500 ml  Net -164.27 ml   Filed Weights   01/26/19 1644 01/28/19 0326  Weight: 127.9 kg 89.7 kg    Examination:  General exam: Appears calm and comfortable HEENT: white plaque on tongue Respiratory system: Clear to auscultation. Respiratory effort normal. Cardiovascular system: S1 & S2 heard, RRR. No murmurs, rubs, gallops or clicks. Gastrointestinal system: Abdomen is nondistended, soft and nontender. No organomegaly or masses felt. Normal bowel sounds heard. Central nervous system: Alert and oriented. No focal neurological deficits. Extremities: No edema. No calf tenderness Skin: No cyanosis. No rashes Psychiatry: Judgement and insight appear normal. Anxious mood.      Data Reviewed: I have personally reviewed following labs and imaging studies  CBC: Recent Labs  Lab 01/26/19 1536 01/26/19 1619 01/26/19 2240 01/27/19 1231  WBC 20.5*  --  24.5* 22.8*  NEUTROABS  --   --   --  17.8*  HGB 18.0* 18.7* 16.4* 16.0*  HCT 53.4* 55.0* 48.1* 47.3*  MCV 88.7  --  88.3 88.2  PLT 407*  --  371 [id number]   Basic Metabolic Panel: Recent Labs  Lab 01/27/19 1231 01/27/19 1618 01/27/19 2041 01/28/19 0029 01/28/19 0431  NA 148* 148* 147* 146* 145  K 3.8 4.3 3.4* 4.9 3.2*  CL 118* 117* 113* 118* 114*  CO2 14* 16* 19* 11* 20*  GLUCOSE 127* 205* 99 193* 137*  BUN 16 14 12 11 7   CREATININE 1.26* 1.32* 1.15* 1.13* 0.96  CALCIUM 8.9 8.6* 8.9 8.5*  8.5*   GFR: Estimated Creatinine Clearance: 83 mL/min (by C-G formula based on SCr of 0.96 mg/dL). Liver Function Tests: Recent Labs  Lab 01/26/19 1536 01/27/19 0606  AST 14* 16  ALT 16 14  ALKPHOS 113 95  BILITOT 1.6* 1.4*  PROT 8.6* 7.0  ALBUMIN 3.9 3.1*   Recent Labs  Lab 01/26/19 1536  LIPASE 17   No results for input(s): AMMONIA in the last 168 hours. Coagulation Profile: No results for input(s): INR, PROTIME in the last 168 hours. Cardiac Enzymes: No  results for input(s): CKTOTAL, CKMB, CKMBINDEX, TROPONINI in the last 168 hours. BNP (last 3 results) No results for input(s): PROBNP in the last 8760 hours. HbA1C: No results for input(s): HGBA1C in the last 72 hours. CBG: Recent Labs  Lab 01/28/19 0503 01/28/19 0612 01/28/19 0758 01/28/19 0913 01/28/19 1001  GLUCAP 119* 121* 164* 179* 177*   Lipid Profile: No results for input(s): CHOL, HDL, LDLCALC, TRIG, CHOLHDL, LDLDIRECT in the last 72 hours. Thyroid Function Tests: No results for input(s): TSH, T4TOTAL, FREET4, T3FREE, THYROIDAB in the last 72 hours. Anemia Panel: No results for input(s): VITAMINB12, FOLATE, FERRITIN, TIBC, IRON, RETICCTPCT in the last 72 hours. Sepsis Labs: No results for input(s): PROCALCITON, LATICACIDVEN in the last 168 hours.  Recent Results (from the past 240 hour(s))  Culture, blood (routine x 2)     Status: None (Preliminary result)   Collection Time: 01/26/19  1:20 AM   Specimen: BLOOD  Result Value Ref Range Status   Specimen Description BLOOD RIGHT ARM  Final   Special Requests   Final    BOTTLES DRAWN AEROBIC AND ANAEROBIC Blood Culture adequate volume   Culture   Final    NO GROWTH 1 DAY Performed at [facility], [address] [place], [place] [address]    Report Status PENDING  Incomplete  Urine culture     Status: None   Collection Time: 01/26/19  5:06 PM   Specimen: Urine, Random  Result Value Ref Range Status   Specimen Description URINE, RANDOM  Final   Special Requests NONE  Final   Culture   Final    NO GROWTH Performed at [facility], [place] [address] [place], [place] [address]    Report Status 01/27/2019 FINAL  Final  SARS Coronavirus 2 [facility] order, Performed in [facility] hospital lab) Nasopharyngeal Nasopharyngeal Swab     Status: None   Collection Time: 01/26/19  5:36 PM   Specimen: Nasopharyngeal Swab  Result Value Ref Range Status   SARS Coronavirus 2 NEGATIVE NEGATIVE Final    Comment:  (NOTE) If result is NEGATIVE SARS-CoV-2 target nucleic acids are NOT DETECTED. The SARS-CoV-2 RNA is generally detectable in upper and lower  respiratory specimens during the acute phase of infection. The lowest  concentration of SARS-CoV-2 viral copies this assay can detect is 250  copies / mL. A negative result does not preclude SARS-CoV-2 infection  and should not be used as the sole basis for treatment or other  patient management decisions.  A negative result may occur with  improper specimen collection / handling, submission of specimen other  than nasopharyngeal swab, presence of viral mutation(s) within the  areas targeted by this assay, and inadequate number of viral copies  (<250 copies / mL). A negative result must be combined with clinical  observations, patient history, and epidemiological information. If result is POSITIVE SARS-CoV-2 target nucleic acids are DETECTED. The SARS-CoV-2 RNA is generally detectable in  upper and lower  respiratory specimens dur ing the acute phase of infection.  Positive  results are indicative of active infection with SARS-CoV-2.  Clinical  correlation with patient history and other diagnostic information is  necessary to determine patient infection status.  Positive results do  not rule out bacterial infection or co-infection with other viruses. If result is PRESUMPTIVE POSTIVE SARS-CoV-2 nucleic acids MAY BE PRESENT.   A presumptive positive result was obtained on the submitted specimen  and confirmed on repeat testing.  While 2019 novel coronavirus  (SARS-CoV-2) nucleic acids may be present in the submitted sample  additional confirmatory testing may be necessary for epidemiological  and / or clinical management purposes  to differentiate between  SARS-CoV-2 and other Sarbecovirus currently known to infect humans.  If clinically indicated additional testing with an alternate test  methodology [phone]) is advised. The SARS-CoV-2 RNA is  generally  detectable in upper and lower respiratory sp ecimens during the acute  phase of infection. The expected result is Negative. Fact Sheet for Patients:  StrictlyIdeas.no Fact Sheet for Healthcare Providers: BankingDealers.co.za This test is not yet approved or cleared by the [place] FDA and has been authorized for detection and/or diagnosis of SARS-CoV-2 by FDA under an Emergency Use Authorization (EUA).  This EUA will remain in effect (meaning this test can be used) for the duration of the COVID-19 declaration under Section 564(b)(1) of the Act, 21 U.S.C. section 360bbb-3(b)(1), unless the authorization is terminated or revoked sooner. Performed at [facility], [place] [address] [place], [place] [address]   Culture, blood (routine x 2)     Status: None (Preliminary result)   Collection Time: 01/26/19 10:40 PM   Specimen: BLOOD  Result Value Ref Range Status   Specimen Description BLOOD LEFT ANTECUBITAL  Final   Special Requests   Final    BOTTLES DRAWN AEROBIC AND ANAEROBIC Blood Culture adequate volume   Culture   Final    NO GROWTH 2 DAYS Performed at [facility], [place] [address] [place], [place] [address]    Report Status PENDING  Incomplete         Radiology Studies: Ct Abdomen Pelvis Wo Contrast  Result Date: 01/26/2019 CLINICAL DATA:  Acute renal failure. Patient complains of constipation for 3 weeks. Nausea and vomiting. EXAM: CT ABDOMEN AND PELVIS WITHOUT CONTRAST TECHNIQUE: Multidetector CT imaging of the abdomen and pelvis was performed following the standard protocol without IV contrast. COMPARISON:  CT 07/31/2018 FINDINGS: Lower chest: Normal heart size. No pleural effusion. No acute airspace disease. Small hiatal hernia with distal esophageal wall thickening. Hepatobiliary: No focal hepatic abnormality allowing for lack contrast. High-density gallbladder contents may be secondary to sludge. No  calcified gallstone. No pericholecystic inflammation. No biliary dilatation. Pancreas: No ductal dilatation or inflammation. Spleen: Normal in size without focal abnormality. Adrenals/Urinary Tract: No adrenal nodule. No hydronephrosis. No significant perinephric edema. Both ureters are decompressed. Urinary bladder is distended. No bladder wall thickening. No bladder stone. Stomach/Bowel: Small hiatal hernia. There is wall thickening of the distal esophagus, new from prior exam. Stomach physiologically distended. Appendix appears normal. No evidence of bowel wall thickening, distention, or inflammatory changes. Moderate stool burden throughout the colon. There is stool in the rectum without abnormal rectal distention. Vascular/Lymphatic: Mild aorta bi-iliac atherosclerosis. No aneurysm. No enlarged lymph nodes in the abdomen or pelvis. Reproductive: Uterus and bilateral adnexa are unremarkable. Other: No free air, free fluid, or intra-abdominal fluid collection. Musculoskeletal: Degenerative and postsurgical change  in the spine. There are no acute or suspicious osseous abnormalities. IMPRESSION: 1. No renal stones or obstructive uropathy. Distended urinary bladder without bladder wall thickening. 2. Small hiatal hernia with distal esophageal wall thickening, can be seen with reflux or esophagitis. 3. Moderate stool burden throughout the colon. 4. High-density material in the gallbladder is likely sludge. No calcified stone. Aortic Atherosclerosis (ICD10-I70.0). Electronically Signed   By: Keith Rake M.D.   On: 01/26/2019 20:35   Ct Head Wo Contrast  Result Date: 01/26/2019 CLINICAL DATA:  Altered mental status. EXAM: CT HEAD WITHOUT CONTRAST TECHNIQUE: Contiguous axial images were obtained from the base of the skull through the vertex without intravenous contrast. COMPARISON:  None. FINDINGS: Brain: No subdural, epidural, or subarachnoid hemorrhage. Cerebellum, brainstem, and basal cisterns are normal.  Ventricles and sulci are unremarkable. Low-attenuation in the right frontal lobe adjacent to the right lateral ventricle frontal horna as seen on axial image 13 and coronal image 23 is identified. No acute cortical ischemia or infarct is identified. No mass effect or midline shift. Vascular: Calcified atherosclerosis is seen in the intracranial carotids. Skull: Normal. Negative for fracture or focal lesion. Sinuses/Orbits: There is debris in the sphenoid sinuses. Paranasal sinuses, mastoid air cells, and middle ears are otherwise normal. Other: None. IMPRESSION: 1. A rounded region of low attenuation in the right frontal lobe adjacent to the frontal horn of the right lateral ventricle may represent an age-indeterminate lacunar infarct. Recommend clinical correlation. No other acute intracranial abnormalities identified. Electronically Signed   By: Dorise Bullion III M.D   On: 01/26/2019 20:32   Mr Brain Wo Contrast  Result Date: 01/27/2019 CLINICAL DATA:  48 year old female with altered mental status. Possible age indeterminate right frontal lobe lacunar infarct on head CT earlier tonight. History of sickle cell trait. EXAM: MRI HEAD WITHOUT CONTRAST TECHNIQUE: Multiplanar, multiecho pulse sequences of the brain and surrounding structures were obtained without intravenous contrast. COMPARISON:  Head CT 01/26/2019. FINDINGS: Brain: Scaphocephaly. No restricted diffusion to suggest acute infarction. No midline shift, mass effect, evidence of mass lesion, ventriculomegaly, extra-axial collection or acute intracranial hemorrhage. Cervicomedullary junction and pituitary are within normal limits. Chronic T2 and FLAIR hyperintensity adjacent to the right caudate and frontal horn corresponding to the CT finding compatible with remote small vessel infarct. No associated hemosiderin. Elsewhere Pearline Cables and white matter signal is within normal limits throughout the brain. No cortical encephalomalacia or chronic blood products  identified. Vascular: Major intracranial vascular flow voids are preserved. Tortuous vertebrobasilar system. Skull and upper cervical spine: Heterogeneous decreased T1 bone marrow signal, although bone mineralization was within normal limits by CT. Otherwise negative visible cervical spine. Sinuses/Orbits: Negative orbits. Mild fluid/bubbly opacity in the right sphenoid sinus. Trace paranasal sinus mucosal thickening otherwise. Other: Mastoids are clear. Grossly negative internal auditory structures. Scalp and face soft tissues appear negative. IMPRESSION: 1. No acute intracranial abnormality. The CT finding corresponds to chronic small vessel ischemia in the white matter adjacent to the right caudate and frontal horn. 2. Heterogeneously decreased bone marrow signal, perhaps related to sickle cell trait in this clinical setting. Electronically Signed   By: Genevie Ann M.D.   On: 01/27/2019 00:20        Scheduled Meds:  enoxaparin (LOVENOX) injection  60 mg Subcutaneous Q24H   pantoprazole (PROTONIX) IV  40 mg Intravenous QHS   Continuous Infusions:  sodium chloride Stopped (01/27/19 0024)   sodium chloride     ampicillin-sulbactam (UNASYN) IV 3 g (01/28/19 1000)   dextrose  5 % and 0.45% NaCl Stopped (01/26/19 2157)   insulin 1.2 Units/hr (01/28/19 1019)     LOS: 2 days     Cordelia Poche, MD [facility] 01/28/2019, 11:04 AM  If 7PM-7AM, please contact night-coverage [email or web address]

## 2019-01-28 NOTE — Progress Notes (Signed)
Went back to check on patient and inquire about CPAP for tonight.  Patient was sleeping, did not bother patient.  Will continue to monitor and will relate to oncoming RT to ask patient again.

## 2019-01-28 NOTE — Progress Notes (Signed)
Patient did not feel much like answering questions about CPAP tonight.  Will try asking again. Patient had refused last night stating that she did not use a CPAP at home and did not want to use one of ours.  Will try again later.  Patient did have a hoarse voice tonight, and tech stated that she did not feel well.

## 2019-01-28 NOTE — Progress Notes (Signed)
Inpatient Diabetes Program Recommendations  AACE/ADA: New Consensus Statement on Inpatient Glycemic Control (2015)  Target Ranges:  Prepandial:   less than 140 mg/dL      Peak postprandial:   less than 180 mg/dL (1-2 hours)      Critically ill patients:  140 - 180 mg/dL   Lab Results  Component Value Date   GLUCAP 177 (H) 01/28/2019   HGBA1C 9.0 (H) 08/01/2018    Review of Glycemic Control  Diabetes history: DM 2 Outpatient Diabetes medications: Jardiance 25 mg Daily, Glimepiride 4 mg Daily, Lantus 10 units qhs Current orders for Inpatient glycemic control:  IV insulin gtt  Inpatient Diabetes Program Recommendations:    Pt with dental infection also coming in with DKA. Pt on SGLT 2 inhibitor at home that precipitated DKA in the setting of dental infection. May need to d/c Jardiance and have pt reevaluated after dental infection clears and follow up with prescriber.  Pt receiving between 1-4 units/hour on IV insulin gtt.  At time of transition, consider Lantus 16 units, Novolog 0-15 units tid + hs scale.  Thanks,  Tama Headings RN, MSN, BC-ADM Inpatient Diabetes Coordinator Team Pager [phone] (8a-5p)

## 2019-01-29 LAB — BASIC METABOLIC PANEL
Anion gap: 10 (ref 5–15)
BUN: <5 mg/dL — ABNORMAL LOW (ref 6–20)
CO2: 21 mmol/L — ABNORMAL LOW (ref 22–32)
Calcium: 7.5 mg/dL — ABNORMAL LOW (ref 8.9–10.3)
Chloride: 105 mmol/L (ref 98–111)
Creatinine, Ser: 0.81 mg/dL (ref 0.44–1.00)
GFR calc Af Amer: >60 mL/min (ref >60)
GFR calc non Af Amer: >60 mL/min (ref >60)
Glucose, Bld: 202 mg/dL — ABNORMAL HIGH (ref 70–99)
Potassium: 3.4 mmol/L — ABNORMAL LOW (ref 3.5–5.1)
Sodium: 136 mmol/L (ref 135–145)

## 2019-01-29 LAB — GLUCOSE, CAPILLARY
Glucose-Capillary: 128 mg/dL — ABNORMAL HIGH (ref 70–99)
Glucose-Capillary: 134 mg/dL — ABNORMAL HIGH (ref 70–99)
Glucose-Capillary: 160 mg/dL — ABNORMAL HIGH (ref 70–99)
Glucose-Capillary: 182 mg/dL — ABNORMAL HIGH (ref 70–99)
Glucose-Capillary: 197 mg/dL — ABNORMAL HIGH (ref 70–99)
Glucose-Capillary: 213 mg/dL — ABNORMAL HIGH (ref 70–99)

## 2019-01-29 MED ORDER — DULOXETINE HCL 30 MG PO CPEP: 30.0000 mg | ORAL_CAPSULE | Freq: Every day | ORAL | Status: DC

## 2019-01-29 MED ORDER — PANTOPRAZOLE SODIUM 40 MG PO TBEC
40.0000 mg | DELAYED_RELEASE_TABLET | Freq: Every day | ORAL | Status: DC
  Administered 2019-01-29: 40 mg via ORAL
  Filled 2019-01-29: qty 1

## 2019-01-29 MED ORDER — INSULIN GLARGINE 100 UNIT/ML ~~LOC~~ SOLN: 15.0000 [IU] | Freq: Every day | SUBCUTANEOUS | 0 refills | Status: DC

## 2019-01-29 MED ORDER — ENOXAPARIN SODIUM 40 MG/0.4ML ~~LOC~~ SOLN: 40.0000 mg | SUBCUTANEOUS | Status: DC

## 2019-01-29 MED ORDER — ADULT MULTIVITAMIN W/MINERALS CH
1.0000 | ORAL_TABLET | Freq: Every day | ORAL | Status: DC
  Administered 2019-01-29: 1 via ORAL

## 2019-01-29 MED ORDER — POTASSIUM CHLORIDE 10 MEQ/100ML IV SOLN
10.0000 meq | INTRAVENOUS | Status: AC
  Administered 2019-01-29 (×2): 10 meq via INTRAVENOUS
  Filled 2019-01-29 (×2): qty 100

## 2019-01-29 MED ORDER — ALUM & MAG HYDROXIDE-SIMETH 200-200-20 MG/5ML PO SUSP
30.0000 mL | Freq: Once | ORAL | Status: AC
  Administered 2019-01-29: 30 mL via ORAL
  Filled 2019-01-29: qty 30

## 2019-01-29 MED ORDER — GLUCERNA SHAKE PO LIQD
237.0000 mL | Freq: Three times a day (TID) | ORAL | Status: DC
  Administered 2019-01-29: 237 mL via ORAL

## 2019-01-29 MED ORDER — PANTOPRAZOLE SODIUM 40 MG PO TBEC: 40.0000 mg | DELAYED_RELEASE_TABLET | Freq: Every day | ORAL | 0 refills | Status: DC

## 2019-01-29 MED ORDER — AMOXICILLIN-POT CLAVULANATE 875-125 MG PO TABS: 1.0000 | ORAL_TABLET | Freq: Two times a day (BID) | ORAL | 0 refills | Status: AC

## 2019-01-29 MED ORDER — ONDANSETRON HCL 4 MG/2ML IJ SOLN
4.0000 mg | Freq: Four times a day (QID) | INTRAMUSCULAR | Status: DC | PRN
  Administered 2019-01-29: 4 mg via INTRAVENOUS
  Filled 2019-01-29: qty 2

## 2019-01-29 MED ORDER — METOPROLOL TARTRATE 25 MG PO TABS
25.0000 mg | ORAL_TABLET | Freq: Two times a day (BID) | ORAL | Status: DC
  Administered 2019-01-29: 25 mg via ORAL
  Filled 2019-01-29: qty 1

## 2019-01-29 NOTE — Progress Notes (Signed)
Inpatient Diabetes Program Recommendations  AACE/ADA: New Consensus Statement on Inpatient Glycemic Control (2015)  Target Ranges:  Prepandial:   less than 140 mg/dL      Peak postprandial:   less than 180 mg/dL (1-2 hours)      Critically ill patients:  140 - 180 mg/dL   Lab Results  Component Value Date   GLUCAP 213 (H) 01/29/2019   HGBA1C 9.0 (H) 08/01/2018    Review of Glycemic Control  Diabetes history: DM 2 Outpatient Diabetes medications: Jardiance 25 mg Daily, Glimepiride 4 mg Daily, Lantus 10 units qhs Current orders for Inpatient glycemic control:  Lantus 15 units Novolog 0-15 units tid  Inpatient Diabetes Program Recommendations:    Pt with dental infection also coming in with DKA. Pt on SGLT 2 inhibitor at home that precipitated DKA in the setting of dental infection. May need to d/c Jardiance and have pt reevaluated after dental infection clears and follow up with prescriber.  Consider increasing Lantus to 20 units.  Thanks,  Tama Headings RN, MSN, BC-ADM Inpatient Diabetes Coordinator Team Pager [phone] (8a-5p)

## 2019-01-29 NOTE — Plan of Care (Signed)
  Problem: Education: Goal: Ability to describe self-care measures that may prevent or decrease complications (Diabetes Survival Skills Education) will improve Outcome: Adequate for Discharge Goal: Individualized Educational Video(s) Outcome: Adequate for Discharge   Problem: Coping: Goal: Ability to adjust to condition or change in health will improve Outcome: Adequate for Discharge   Problem: Fluid Volume: Goal: Ability to maintain a balanced intake and output will improve Outcome: Adequate for Discharge   Problem: Health Behavior/Discharge Planning: Goal: Ability to identify and utilize available resources and services will improve Outcome: Adequate for Discharge Goal: Ability to manage health-related needs will improve Outcome: Adequate for Discharge   Problem: Metabolic: Goal: Ability to maintain appropriate glucose levels will improve Outcome: Adequate for Discharge   Problem: Nutritional: Goal: Maintenance of adequate nutrition will improve Outcome: Adequate for Discharge Goal: Progress toward achieving an optimal weight will improve Outcome: Adequate for Discharge   Problem: Skin Integrity: Goal: Risk for impaired skin integrity will decrease Outcome: Adequate for Discharge   Problem: Tissue Perfusion: Goal: Adequacy of tissue perfusion will improve Outcome: Adequate for Discharge   Problem: Acute Rehab PT Goals(only PT should resolve) Goal: Patient Will Transfer Sit To/From Stand Outcome: Adequate for Discharge Goal: Pt Will Ambulate Outcome: Adequate for Discharge   Problem: Acute Rehab PT Goals(only PT should resolve) Goal: Pt Will Go Up/Down Stairs Outcome: Adequate for Discharge

## 2019-01-29 NOTE — Evaluation (Signed)
Physical Therapy Evaluation Patient Details Name: Karen Stein MRN: [id number] DOB: July 16, 1970 Today's Date: 01/29/2019   History of Present Illness  Pt is a 48 y/o female admitted secondary to nausea/vomiting, and constipation. Pt found to have DKA and possible dental infection. PMH includes DM, sickle cell trait, and fibromyalgia.   Clinical Impression  Pt admitted secondary to problem above with deficits below. Pt limited this session secondary to pain and nausea. Pt only agreeable to performing bed mobility. Performed manual therapy on low back and R calf and foot to help with pain management. Pt reports she has been unable to care for herself secondary to pain; reports she only takes pain management and does not cook herself meals. Discussed post acute rehab, however, pt wanting to go home. Of note, pt does report she has been depressed, and would likely benefit from follow up for depression. Will continue to follow acutely to maximize functional mobility independence and safety.     Follow Up Recommendations Home health PT(HHOT, [facility])    Equipment Recommendations  Rolling walker with 5" wheels    Recommendations for Other Services       Precautions / Restrictions Precautions Precautions: Fall Restrictions Weight Bearing Restrictions: No      Mobility  Bed Mobility Overal bed mobility: Needs Assistance Bed Mobility: Supine to Sit;Sit to Supine     Supine to sit: Supervision Sit to supine: Supervision   General bed mobility comments: Increased time required to come to EOB. Pt reporting increased nausea at EOB, so further mobility deferred.   Transfers                 General transfer comment: Pt requesting to defer secondary to nausea and pain.   Ambulation/Gait                Stairs            Wheelchair Mobility    Modified Rankin (Stroke Patients Only)       Balance Overall balance assessment: Needs assistance Sitting-balance  support: No upper extremity supported;Feet supported Sitting balance-Leahy Scale: Fair                                       Pertinent Vitals/Pain Pain Assessment: Faces Faces Pain Scale: Hurts even more Pain Location: low back and RLE  Pain Descriptors / Indicators: Grimacing;Guarding Pain Intervention(s): Limited activity within patient's tolerance;Monitored during session;Repositioned    Home Living Family/patient expects to be discharged to:: Private residence Living Arrangements: Spouse/significant other Available Help at Discharge: Family;Available PRN/intermittently Type of Home: House Home Access: Stairs to enter Entrance Stairs-Rails: None Entrance Stairs-Number of Steps: 4 Home Layout: One level Home Equipment: Cane - single point      Prior Function Level of Independence: Independent with assistive device(s)         Comments: Reports using cane for ambulation, but reports fall right before she came in. Reports she is having trouble caring for herself at home secondary to pain.      Hand Dominance        Extremity/Trunk Assessment   Upper Extremity Assessment Upper Extremity Assessment: Defer to OT evaluation    Lower Extremity Assessment Lower Extremity Assessment: Generalized weakness;RLE deficits/detail RLE Deficits / Details: RLE pain into calf and foot     Cervical / Trunk Assessment Cervical / Trunk Assessment: Other exceptions Cervical / Trunk Exceptions: back pain at  baseline.   Communication   Communication: No difficulties  Cognition Arousal/Alertness: Awake/alert Behavior During Therapy: Flat affect Overall Cognitive Status: No family/caregiver present to determine baseline cognitive functioning                                 General Comments: Pt seemed very flat. She expressed feelings of depression, but states she is not suicidal. Reports she is tired of living in pain and needs more help at home. Unsure  of baseline. Notified RN.       General Comments General comments (skin integrity, edema, etc.): Notified RN about pt reported feelings of depression.     Exercises Other Exercises Other Exercises: Manual therapy to low back, R calf and R foot to help with pain management.    Assessment/Plan    PT Assessment Patient needs continued PT services  PT Problem List Decreased strength;Decreased balance;Decreased mobility;Decreased activity tolerance;Decreased knowledge of use of DME;Decreased knowledge of precautions       PT Treatment Interventions Gait training;Stair training;Functional mobility training;Therapeutic activities;Therapeutic exercise;Balance training;Patient/family education    PT Goals (Current goals can be found in the Care Plan section)  Acute Rehab PT Goals Patient Stated Goal: to stop living in pain  PT Goal Formulation: With patient Time For Goal Achievement: 02/12/19 Potential to Achieve Goals: Fair    Frequency Min 3X/week   Barriers to discharge Decreased caregiver support Pt reports husband does not assist as she needs.     Co-evaluation               AM-PAC PT "6 Clicks" Mobility  Outcome Measure Help needed turning from your back to your side while in a flat bed without using bedrails?: None Help needed moving from lying on your back to sitting on the side of a flat bed without using bedrails?: A Little Help needed moving to and from a bed to a chair (including a wheelchair)?: A Little Help needed standing up from a chair using your arms (e.g., wheelchair or bedside chair)?: A Little Help needed to walk in hospital room?: A Little Help needed climbing 3-5 steps with a railing? : A Lot 6 Click Score: 18    End of Session   Activity Tolerance: Patient limited by pain;Treatment limited secondary to medical complications (Comment)(nausea) Patient left: in bed;with call bell/phone within reach;with bed alarm set Nurse Communication: Mobility  status;Other (comment)(pt with reports of depression ) PT Visit Diagnosis: Other abnormalities of gait and mobility (R26.89);Unsteadiness on feet (R26.81)    Time: [id number] PT Time Calculation (min) (ACUTE ONLY): 21 min   Charges:   PT Evaluation $PT Eval Moderate Complexity: 1 Mod          Leighton Ruff, PT, DPT  Acute Rehabilitation Services  Pager: [phone] Office: [phone]   Rudean Hitt 01/29/2019, 12:04 PM

## 2019-01-29 NOTE — Progress Notes (Signed)
Discharge instructions given with stated understanding.  Patient awaiting transportation home at this time 

## 2019-01-29 NOTE — TOC Initial Note (Signed)
Transition of Care [facility]) - Initial/Assessment Note    Patient Details  Name: Karen Stein MRN: [id number] Date of Birth: April 30, 1971  [facility]) CM/SW Contact:    Benard Halsted, LCSW Phone Number: 01/29/2019, 1:39 PM  Clinical Narrative:                 CSW received consult for possible home health services at time of discharge. CSW spoke with patient regarding PT recommendation of Home Health PT at time of discharge. Patient reported that she would like home health services. If not possible, she is in agreement with outpatient PT. CSW will send referral for review. CSW discussed equipment needs with patient and she requested a shower chair. CSW reached out to [employer] to see if her insurance will cover it versus an out of pocket cost. Patient states she uses a cane at home. Patient also requested deep tissue massages, though CSW explained that home health is unable to provide massages. CSW confirmed PCP and address with patient.  No further questions reported at this time.    Expected Discharge Plan: [place] Barriers to Discharge: Equipment Delay   Patient Goals and CMS Choice Patient states their goals for this hospitalization and ongoing recovery are:: Feel better CMS Medicare.gov Compare Post Acute Care list provided to:: Patient Choice offered to / list presented to : Patient  Expected Discharge Plan and Services Expected Discharge Plan: [place] In-house Referral: NA Discharge Planning Services: CM Consult Post Acute Care Choice: Durable Medical Equipment, Home Health Living arrangements for the past 2 months: Single Family Home                 DME Arranged: Shower stool DME Agency: [employer] Date DME Agency Contacted: 01/29/19 Time DME Agency Contacted: [phone] Representative spoke with at DME Agency: [place] Arranged: PT          Prior Living Arrangements/Services Living arrangements for the past 2 months: [place] Lives with:: Spouse Patient language and need for interpreter reviewed:: Yes Do you feel safe going back to the place where you live?: Yes      Need for Family Participation in Patient Care: No (Comment) Care giver support system in place?: Yes (comment) Current home services: DME Criminal Activity/Legal Involvement Pertinent to Current Situation/Hospitalization: No - Comment as needed  Activities of Daily Living      Permission Sought/Granted Permission sought to share information with : Facility Art therapist granted to share information with : Yes, Verbal Permission Granted     Permission granted to share info w AGENCY: Home Health        Emotional Assessment Appearance:: Appears stated age Attitude/Demeanor/Rapport: Other (comment)(In pain) Affect (typically observed): Accepting, Appropriate(In pain) Orientation: : Oriented to Self, Oriented to Place, Oriented to  Time, Oriented to Situation Alcohol / Substance Use: Not Applicable Psych Involvement: No (comment)  Admission diagnosis:  Dehydration 99991111 Metabolic acidosis 99991111 Acute renal failure, unspecified acute renal failure type ([place]) [N17.9] Diabetic ketoacidosis without coma associated with type 2 diabetes mellitus ([place]) [E11.10] Patient Active Problem List   Diagnosis Date Noted  . DKA (diabetic ketoacidoses) ([place]) 01/26/2019  . Esophagitis   . Hyperbilirubinemia   . AKI (acute kidney injury) ([place])   . OSA on CPAP   . Lacunar infarction ([place])   . HLD (hyperlipidemia) 07/31/2018  . Prolonged QT interval 07/31/2018  . Chest pain 07/31/2018  . Nausea vomiting and diarrhea 07/31/2018  .  Abdominal pain 07/31/2018  . Elevated blood protein 07/31/2018  . Diabetes mellitus without complication ([place]) 0000000  . Anxiety   . Depression 08/25/2017  . 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 Degenerative disc disease   . Sickle  cell trait ([place])   . DDD (degenerative disc disease), lumbar    PCP:  [name], MD Pharmacy:   [facility] [place], [address] [place] [address] Phone: [phone] Fax: [phone]     Social Determinants of Health (SDOH) Interventions    Readmission Risk Interventions Readmission Risk Prevention Plan 01/29/2019  Transportation Screening Complete  PCP or Specialist Appt within 5-7 Days Complete  Home Care Screening Complete  Medication Review (RN CM) Complete  Some recent data might be hidden

## 2019-01-29 NOTE — Discharge Instructions (Signed)
Karen Stein,  You were in the hospital because of DKA. This was managed with IV insulin. Your home insulin has been adjusted.   Carbohydrate Counting For People With Diabetes  Why Is Carbohydrate Counting Important?  Counting carbohydrate servings may help you control your blood glucose level so that you feel better.   The balance between the carbohydrates you eat and insulin determines what your blood glucose level will be after eating.   Carbohydrate counting can also help you plan your meals. Which Foods Have Carbohydrates? Foods with carbohydrates include:  Breads, crackers, and cereals   Pasta, rice, and grains   Starchy vegetables, such as potatoes, corn, and peas   Beans and legumes   Milk, soy milk, and yogurt   Fruits and fruit juices   Sweets, such as cakes, cookies, ice cream, jam, and jelly Carbohydrate Servings In diabetes meal planning, 1 serving of a food with carbohydrate has about 15 grams of carbohydrate:  Check serving sizes with measuring cups and spoons or a food scale.   Read the Nutrition Facts on food labels to find out how many grams of carbohydrate are in foods you eat. The food lists in this handout show portions that have about 15 grams of carbohydrate.  Tips Meal Planning Tips  An Eating Plan tells you how many carbohydrate servings to eat at your meals and snacks. For many adults, eating 3 to 5 servings of carbohydrate foods at each meal and 1 or 2 carbohydrate servings for each snack works well.   In a healthy daily Eating Plan, most carbohydrates come from:   At least 6 servings of fruits and nonstarchy vegetables   At least 6 servings of grains, beans, and starchy vegetables, with at least 3 servings from whole grains   At least 2 servings of milk or milk products  Check your blood glucose level regularly. It can tell you if you need to adjust when you eat carbohydrates.   Eating foods that have fiber, such as whole grains, and  having very few salty foods is good for your health.   Eat 4 to 6 ounces of meat or other protein foods (such as soybean burgers) each day. Choose low-fat sources of protein, such as lean beef, lean pork, chicken, fish, low-fat cheese, or vegetarian foods such as soy.   Eat some healthy fats, such as olive oil, canola oil, and nuts.   Eat very little saturated fats. These unhealthy fats are found in butter, cream, and high-fat meats, such as bacon and sausage.   Eat very little or no trans fats. These unhealthy fats are found in all foods that list partially hydrogenated oil as an ingredient.  Label Reading Tips The Nutrition Facts panel on a label lists the grams of total carbohydrate in 1 standard serving. The label's standard serving may be larger or smaller than 1 carbohydrate serving. To figure out how many carbohydrate servings are in the food:  First, look at the label's standard serving size.   Check the grams of total carbohydrate. This is the amount of carbohydrate in 1 standard serving.   Divide the grams of total carbohydrate by 15. This number equals the number of carbohydrate servings in 1 standard serving. Remember: 1 carbohydrate serving is 15 grams of carbohydrate.   Note: You may ignore the grams of sugars on the Nutrition Facts panel because they are included in the grams of total carbohydrate.  Foods Recommended 1 serving = about 15 grams of carbohydrate  Starches  1 slice bread (1 ounce)   1 tortilla (6-inch size)    large bagel (1 ounce)   2 taco shells (5-inch size)    hamburger or hot dog bun ( ounce)    cup ready-to-eat unsweetened cereal    cup cooked cereal   1 cup broth-based soup   4 to 6 small crackers   1/3 cup pasta or rice (cooked)    cup beans, peas, corn, sweet potatoes, winter squash, or mashed or boiled potatoes (cooked)    large baked potato (3 ounces)    ounce pretzels, potato chips, or tortilla chips   3 cups popcorn  (popped) Fruit  1 small fresh fruit ( to 1 cup)    cup canned or frozen fruit   2 tablespoons dried fruit (blueberries, cherries, cranberries, mixed fruit, raisins)   17 small grapes (3 ounces)   1 cup melon or berries    cup unsweetened fruit juice Milk  1 cup fat-free or reduced-fat milk   1 cup soy milk   2/3 cup (6 ounces) nonfat yogurt sweetened with sugar-free sweetener Sweets and Desserts  2-inch square cake (unfrosted)   2 small cookies (2/3 ounce)    cup ice cream or frozen yogurt    cup sherbet or sorbet   1 tablespoon syrup, jam, jelly, table sugar, or honey   2 tablespoons light syrup Other Foods  Count 1 cup raw vegetables or  cup cooked nonstarchy vegetables as zero (0) carbohydrate servings or free foods. If you eat 3 or more servings at one meal, count them as 1 carbohydrate serving.   Foods that have less than 20 calories in each serving also may be counted as zero carbohydrate servings or free foods.   Count 1 cup of casserole or other mixed foods as 2 carbohydrate servings.  Carbohydrate Counting for People with Diabetes Sample 1-Day Menu  Breakfast 1 extra-small banana (1 carbohydrate serving)  1 cup low-fat or fat-free milk (1 carbohydrate serving)  1 slice whole wheat bread (1 carbohydrate serving)  1 teaspoon margarine  Lunch 2 ounces [place] slices  2 slices whole wheat bread (2 carbohydrate servings)  2 lettuce leaves  4 celery sticks  4 carrot sticks  1 medium apple (1 carbohydrate serving)  1 cup low-fat or fat-free milk (1 carbohydrate serving)  Afternoon Snack 2 tablespoons raisins (1 carbohydrate serving)  3/4 ounce unsalted mini pretzels (1 carbohydrate serving)  Evening Meal 3 ounces lean roast beef  1/2 large baked potato (2 carbohydrate servings)  1 tablespoon reduced-fat sour cream  1/2 cup green beans  1 tablespoon light salad dressing  1 whole wheat dinner roll (1 carbohydrate serving)  1 teaspoon margarine    1 cup melon balls (1 carbohydrate serving)  Evening Snack 2 tablespoons unsalted nuts   Carbohydrate Counting for People with Diabetes Vegan Sample 1-Day Menu  Breakfast 1 cup cooked oatmeal (2 carbohydrate servings)   cup blueberries (1 carbohydrate serving)  2 tablespoons flaxseeds  1 cup soymilk fortified with calcium and vitamin D  1 cup coffee  Lunch 2 slices whole wheat bread (2 carbohydrate servings)   cup baked tofu   cup lettuce  2 slices tomato  2 slices avocado   cup baby carrots  1 orange (1 carbohydrate serving)  1 cup soymilk fortified with calcium and vitamin D   Evening Meal Burrito made with: 1 6-inch corn tortilla (1 carbohydrate serving)  1 cup refried vegetarian beans (1 carbohydrate serving)   cup  chopped tomatoes   cup lettuce   cup salsa  1/3 cup brown rice (1 carbohydrate serving)  1 tablespoon olive oil for rice   cup zucchini   Evening Snack 6 small whole grain crackers (1 carbohydrate serving)  2 apricots ( carbohydrate serving)   cup unsalted peanuts ( carbohydrate serving)     Carbohydrate Counting for People with Diabetes Vegetarian (Lacto-Ovo) Sample 1-Day Menu  Breakfast 1 cup cooked oatmeal (2 carbohydrate servings)   cup blueberries (1 carbohydrate serving)  2 tablespoons flaxseeds  1 egg  1 cup 1% milk (1 carbohydrate serving)  1 cup coffee  Lunch 2 slices whole wheat bread (2 carbohydrate servings)  2 ounces low-fat cheese   cup lettuce  2 slices tomato  2 slices avocado   cup baby carrots  1 orange (1 carbohydrate serving)  1 cup unsweetened tea  Evening Meal Burrito made with: 1 6-inch corn tortilla (1 carbohydrate serving)   cup refried vegetarian beans (1 carbohydrate serving)   cup tomatoes   cup lettuce   cup salsa  1/3 cup brown rice (1 carbohydrate serving)  1 tablespoon olive oil for rice   cup zucchini  1 cup 1% milk (1 carbohydrate serving)  Evening Snack 6 small whole grain crackers (1  carbohydrate serving)  2 apricots ( carbohydrate serving)   cup unsalted peanuts ( carbohydrate serving)    Copyright 2020  Academy of Nutrition and Dietetics. All rights reserved.  Using Nutrition Labels: Carbohydrate   Serving Size   Look at the serving size. All the information on the label is based on this portion.  Servings Per Container   The number of servings contained in the package.  Guidelines for Carbohydrate   Look at the total grams of carbohydrate in the serving size.   1 carbohydrate choice = 15 grams of carbohydrate. Range of Carbohydrate Grams Per Choice  Carbohydrate Grams/Choice Carbohydrate Choices  6-10   11-20 1  21-25 1  26-35 2  36-40 2  41-50 3  51-55 3  56-65 4  66-70 4  71-80 5    Copyright 2020  Academy of Nutrition and Dietetics. All rights reserved.

## 2019-01-29 NOTE — TOC Progression Note (Addendum)
Transition of Care [facility]) - Progression Note    Patient Details  Name: Karen Stein MRN: [id number] Date of Birth: [birth year]  Transition of Care [facility]) CM/SW [place], LCSW Phone Number: 01/29/2019, 4:35 PM  Clinical Narrative:    Patient reported that she does not want to discharge today. MD aware. [employer] to deliver a shower chair to patient's room. Patient declined by [facility] and [facility]. [facility] able to accept patient and can start services next week.     Expected Discharge Plan: [place] Barriers to Discharge: Equipment Delay  Expected Discharge Plan and Services Expected Discharge Plan: [place] In-house Referral: NA Discharge Planning Services: CM Consult Post Acute Care Choice: Durable Medical Equipment, Home Health Living arrangements for the past 2 months: Single Family Home Expected Discharge Date: 01/29/19               DME Arranged: Shower stool DME Agency: [employer] Date DME Agency Contacted: 01/29/19 Time DME Agency Contacted: [phone] Representative spoke with at DME Agency: Thedore Mins HH Arranged: PT           Social Determinants of Health ([place]) Interventions    Readmission Risk Interventions Readmission Risk Prevention Plan 01/29/2019  Transportation Screening Complete  PCP or Specialist Appt within 5-7 Days Complete  Home Care Screening Complete  Medication Review (RN CM) Complete  Some recent data might be hidden

## 2019-01-29 NOTE — Discharge Summary (Signed)
Physician Discharge Summary  Karen Stein [id number] DOB: [birth year] DOA: 01/26/2019  PCP: [name], MD  Admit date: 01/26/2019 Discharge date: 01/29/2019  Admitted From: Home Disposition: Home  Recommendations for Outpatient Follow-up:  1. Follow up with PCP in 1 week 2. Please obtain BMP/CBC in one week 3. Please follow up on the following pending results: None  Home Health: PT, OT, Aide Equipment/Devices: None  Discharge Condition: Stable CODE STATUS: Full code Diet recommendation: Heart healthy/carb modified   Brief/Interim Summary:  Admission HPI written by Orene Desanctis, DO   Chief Complaint: weakness, nausea and vomiting, constipation  HPI: Karen Stein is a 48 y.o. female with medical history significant of type 2 diabetes, asthma, sickle cell trait, prolonged QT, anxiety, hyperlipidemia who presented with symptoms of weakness, nausea and vomiting for the past 5 days.  She denies any fevers or chills.  Denies any abdominal pain or diarrhea.  Denies any chest pain or shortness of breath.  She endorses she has been compliant with her 10 units of insulin daily and was still taking it during the times when she felt ill.  She denies any tobacco, alcohol or illicit drug use.  She reports that for the past several months she has also been having dental pain and has not seek any medical care for this.  ED Course: She was afebrile, hypertensive up to the 150s over 100 and tachycardic up to 120s.  She was ill-appearing and lethargic and had a difficult time providing an HPI without falling asleep.  Patient also was very thirsty and continued to request for ice water.  Her VBG on arrival showed a pH of 7.218.  Bicarb of 11 and anion gap of 29 with blood glucose of 727.  Her creatinine was also elevated to 2.01 from a prior of 0.79 five months ago.  Bilirubin mildly elevated 1.6 with normal LFTs.  BC showed leukocytosis of 20.5 and elevated hemoglobin of 18 and  platelet of 407.  CT head was obtained due to her altered mental status and it revealed a rounded area of low attenuation in the right frontal lobe that may represent an age-indeterminate lacunar infarct.  CT abdomen pelvis was obtained by EDP since patient reportedly complained of constipation.  It revealed small hiatal hernia with distal esophageal wall thickening likely from reflux or esophagitis.  There is moderate stool burden throughout the colon.  There may be gallbladder sludge but no pericholecystic inflammation.   Hospital course:  DKA Unsure of etiology. Per patient she has been adherent with insulin therapy. Anion gap closed with IV insulin but appears to be increasing now. She has associated worsening acidosis.  -Continue Insulin drip -BMP q4 hours -Transition to Lantus 15 units with diet order and at least 2 hour IV/subq insulin overlap  Possible dental infection Does not look overly infected today but patient has been receiving antibiotics. Orthopantogram recommended but not performed yesterday secondary to patient intolerance -Continue Unasyn -Orthopantogram  AKI Resolved with IV fluids.  Nausea/vomiting -Continue Zofran prn  Esophagitis/GERD -Continue protonix  Hyperbilirubinemia Mild increase. -Repeat CMP  Discharge Diagnoses:  Active Problems:   Depression   DKA (diabetic ketoacidoses) [facility])    Discharge Instructions  Discharge Instructions    Diet - low sodium heart healthy   Complete by: As directed    Increase activity slowly   Complete by: As directed      Allergies as of 01/29/2019      Reactions   Macrobid [nitrofurantoin  Macrocrystal] Nausea Only   Headache and "severe abd cramps"   Shellfish Allergy Swelling   Itching of lips and ears, NOT allergic to iodine contrast   Sulfa Antibiotics Other (See Comments)   Dizzy and sees spots with sulfa drugs      Medication List    TAKE these medications   amoxicillin-clavulanate 875-125 MG  tablet Commonly known as: Augmentin Take 1 tablet by mouth every 12 (twelve) hours for 4 days. Start taking on: January 30, 2019   atorvastatin 20 MG tablet Commonly known as: LIPITOR Take 1 tablet (20 mg total) by mouth daily at 6 PM.   DULoxetine 30 MG capsule Commonly known as: CYMBALTA Take 30 mg by mouth at bedtime.   gabapentin 300 MG capsule Commonly known as: NEURONTIN Take 600 mg by mouth 3 (three) times daily.   glimepiride 4 MG tablet Commonly known as: AMARYL Take 4 mg by mouth daily.   insulin glargine 100 UNIT/ML injection Commonly known as: LANTUS Inject 0.15 mLs (15 Units total) into the skin at bedtime. What changed:   how much to take  when to take this   Jardiance 25 MG Tabs tablet Generic drug: empagliflozin Take 25 mg by mouth daily.   metoCLOPramide 5 MG tablet Commonly known as: REGLAN Take 1 tablet (5 mg total) by mouth every 6 (six) hours as needed for nausea.   metoprolol tartrate 25 MG tablet Commonly known as: LOPRESSOR Take 1 tablet (25 mg total) by mouth 2 (two) times daily.   oxycodone 30 MG immediate release tablet Commonly known as: ROXICODONE Take 30 mg by mouth every 6 (six) hours as needed for pain.   pantoprazole 40 MG tablet Commonly known as: PROTONIX Take 1 tablet (40 mg total) by mouth daily. Start taking on: January 30, 2019   tiZANidine 4 MG tablet Commonly known as: ZANAFLEX Take 4 mg by mouth 3 (three) times daily.            Durable Medical Equipment  (From admission, onward)         Start     Ordered   01/29/19 1516  For home use only DME Walker rolling  Once    Question Answer Comment  Patient needs a walker to treat with the following condition DKA (diabetic ketoacidoses) ([facility])   Patient needs a walker to treat with the following condition Fibromyalgia      01/29/19 1517   01/29/19 1349  For home use only DME Other see comment  Once    Comments: Shower seat with a back  Question:  Length of Need   Answer:  Lifetime   01/29/19 1348         Follow-up Information    Osei-Bonsu, Iona Beard, MD. Schedule an appointment as soon as possible for a visit in 1 week(s).   Specialty: Internal Medicine Contact information: [id number] [place] [address] [phone]        [facility] Follow up.   Why: Shower chair to be delivered to room prior to discharge.        [facility] Follow up.   Specialty: [place] Why: [phone] [place] arranged, to begin next week.          Allergies  Allergen Reactions   Macrobid [Nitrofurantoin Macrocrystal] Nausea Only    Headache and "severe abd cramps"   Shellfish Allergy Swelling    Itching of lips and ears, NOT allergic to iodine contrast  Sulfa Antibiotics Other (See Comments)    Dizzy and sees spots with sulfa drugs    Consultations:  None   Procedures/Studies: Ct Abdomen Pelvis Wo Contrast  Result Date: 01/26/2019 CLINICAL DATA:  Acute renal failure. Patient complains of constipation for 3 weeks. Nausea and vomiting. EXAM: CT ABDOMEN AND PELVIS WITHOUT CONTRAST TECHNIQUE: Multidetector CT imaging of the abdomen and pelvis was performed following the standard protocol without IV contrast. COMPARISON:  CT 07/31/2018 FINDINGS: Lower chest: Normal heart size. No pleural effusion. No acute airspace disease. Small hiatal hernia with distal esophageal wall thickening. Hepatobiliary: No focal hepatic abnormality allowing for lack contrast. High-density gallbladder contents may be secondary to sludge. No calcified gallstone. No pericholecystic inflammation. No biliary dilatation. Pancreas: No ductal dilatation or inflammation. Spleen: Normal in size without focal abnormality. Adrenals/Urinary Tract: No adrenal nodule. No hydronephrosis. No significant perinephric edema. Both ureters are decompressed. Urinary bladder is distended. No bladder wall thickening. No bladder  stone. Stomach/Bowel: Small hiatal hernia. There is wall thickening of the distal esophagus, new from prior exam. Stomach physiologically distended. Appendix appears normal. No evidence of bowel wall thickening, distention, or inflammatory changes. Moderate stool burden throughout the colon. There is stool in the rectum without abnormal rectal distention. Vascular/Lymphatic: Mild aorta bi-iliac atherosclerosis. No aneurysm. No enlarged lymph nodes in the abdomen or pelvis. Reproductive: Uterus and bilateral adnexa are unremarkable. Other: No free air, free fluid, or intra-abdominal fluid collection. Musculoskeletal: Degenerative and postsurgical change in the spine. There are no acute or suspicious osseous abnormalities. IMPRESSION: 1. No renal stones or obstructive uropathy. Distended urinary bladder without bladder wall thickening. 2. Small hiatal hernia with distal esophageal wall thickening, can be seen with reflux or esophagitis. 3. Moderate stool burden throughout the colon. 4. High-density material in the gallbladder is likely sludge. No calcified stone. Aortic Atherosclerosis (ICD10-I70.0). Electronically Signed   By: Keith Rake M.D.   On: 01/26/2019 20:35   Dg Orthopantogram  Result Date: 01/28/2019 CLINICAL DATA:  Redness and swelling of the gums, possible dental infection EXAM: ORTHOPANTOGRAM/PANORAMIC COMPARISON:  None. FINDINGS: Multiple absent dentition (teeth 1, 2, 4, 5, 12, 13, 15, 16, 17, 30, 31, 32) Multiple carious lesions of the dentition extending into the gumline involving involving tooth 19. Carious lesions are present at tooth 27 and 28. More subtle carious lesions at 6 and 11 as well. Question some periapical lucency of the central mandibular incisors (25, 24) and right lateral incisor (26). IMPRESSION: Extensive periodontal disease, as detailed above. Correlate with dental evaluation. Electronically Signed   By: Lovena Le M.D.   On: 01/28/2019 19:38   Ct Head Wo  Contrast  Result Date: 01/26/2019 CLINICAL DATA:  Altered mental status. EXAM: CT HEAD WITHOUT CONTRAST TECHNIQUE: Contiguous axial images were obtained from the base of the skull through the vertex without intravenous contrast. COMPARISON:  None. FINDINGS: Brain: No subdural, epidural, or subarachnoid hemorrhage. Cerebellum, brainstem, and basal cisterns are normal. Ventricles and sulci are unremarkable. Low-attenuation in the right frontal lobe adjacent to the right lateral ventricle frontal horna as seen on axial image 13 and coronal image 23 is identified. No acute cortical ischemia or infarct is identified. No mass effect or midline shift. Vascular: Calcified atherosclerosis is seen in the intracranial carotids. Skull: Normal. Negative for fracture or focal lesion. Sinuses/Orbits: There is debris in the sphenoid sinuses. Paranasal sinuses, mastoid air cells, and middle ears are otherwise normal. Other: None. IMPRESSION: 1. A rounded region of low attenuation in the right frontal lobe adjacent  to the frontal horn of the right lateral ventricle may represent an age-indeterminate lacunar infarct. Recommend clinical correlation. No other acute intracranial abnormalities identified. Electronically Signed   By: Dorise Bullion III M.D   On: 01/26/2019 20:32   Mr Brain Wo Contrast  Result Date: 01/27/2019 CLINICAL DATA:  48 year old female with altered mental status. Possible age indeterminate right frontal lobe lacunar infarct on head CT earlier tonight. History of sickle cell trait. EXAM: MRI HEAD WITHOUT CONTRAST TECHNIQUE: Multiplanar, multiecho pulse sequences of the brain and surrounding structures were obtained without intravenous contrast. COMPARISON:  Head CT 01/26/2019. FINDINGS: Brain: Scaphocephaly. No restricted diffusion to suggest acute infarction. No midline shift, mass effect, evidence of mass lesion, ventriculomegaly, extra-axial collection or acute intracranial hemorrhage. Cervicomedullary  junction and pituitary are within normal limits. Chronic T2 and FLAIR hyperintensity adjacent to the right caudate and frontal horn corresponding to the CT finding compatible with remote small vessel infarct. No associated hemosiderin. Elsewhere Pearline Cables and white matter signal is within normal limits throughout the brain. No cortical encephalomalacia or chronic blood products identified. Vascular: Major intracranial vascular flow voids are preserved. Tortuous vertebrobasilar system. Skull and upper cervical spine: Heterogeneous decreased T1 bone marrow signal, although bone mineralization was within normal limits by CT. Otherwise negative visible cervical spine. Sinuses/Orbits: Negative orbits. Mild fluid/bubbly opacity in the right sphenoid sinus. Trace paranasal sinus mucosal thickening otherwise. Other: Mastoids are clear. Grossly negative internal auditory structures. Scalp and face soft tissues appear negative. IMPRESSION: 1. No acute intracranial abnormality. The CT finding corresponds to chronic small vessel ischemia in the white matter adjacent to the right caudate and frontal horn. 2. Heterogeneously decreased bone marrow signal, perhaps related to sickle cell trait in this clinical setting. Electronically Signed   By: Genevie Ann M.D.   On: 01/27/2019 00:20     Subjective: No issues overnight. Feels better today.  Discharge Exam: Vitals:   01/29/19 1151 01/29/19 1552  BP: 132/78 128/78  Pulse: 89 88  Resp: 18 16  Temp: 99.1 F (37.3 C) 99.2 F (37.3 C)  SpO2: 99% 97%   Vitals:   01/29/19 0424 01/29/19 0800 01/29/19 1151 01/29/19 1552  BP: 133/71 (!) 148/91 132/78 128/78  Pulse: 90 91 89 88  Resp: 16 17 18 16   Temp: 98 F (36.7 C) 98.4 F (36.9 C) 99.1 F (37.3 C) 99.2 F (37.3 C)  TempSrc: Oral Oral Oral Oral  SpO2: 100% 100% 99% 97%  Weight: 96.2 kg     Height:        General: Pt is alert, awake, not in acute distress Cardiovascular: RRR, S1/S2 +, no rubs, no  gallops Respiratory: CTA bilaterally, no wheezing, no rhonchi Abdominal: Soft, NT, ND, bowel sounds + Extremities: no edema, no cyanosis    The results of significant diagnostics from this hospitalization (including imaging, microbiology, ancillary and laboratory) are listed below for reference.     Microbiology: Recent Results (from the past 240 hour(s))  Culture, blood (routine x 2)     Status: None (Preliminary result)   Collection Time: 01/26/19  1:20 AM   Specimen: BLOOD  Result Value Ref Range Status   Specimen Description BLOOD RIGHT ARM  Final   Special Requests   Final    BOTTLES DRAWN AEROBIC AND ANAEROBIC Blood Culture adequate volume   Culture   Final    NO GROWTH 2 DAYS Performed at [facility], [address] [place], [place] [address]    Report Status PENDING  Incomplete  Urine culture     Status: None   Collection Time: 01/26/19  5:06 PM   Specimen: Urine, Random  Result Value Ref Range Status   Specimen Description URINE, RANDOM  Final   Special Requests NONE  Final   Culture   Final    NO GROWTH Performed at [facility], [address] [place], [place] [address]    Report Status 01/27/2019 FINAL  Final  SARS Coronavirus 2 [facility] order, Performed in [facility] hospital lab) Nasopharyngeal Nasopharyngeal Swab     Status: None   Collection Time: 01/26/19  5:36 PM   Specimen: Nasopharyngeal Swab  Result Value Ref Range Status   SARS Coronavirus 2 NEGATIVE NEGATIVE Final    Comment: (NOTE) If result is NEGATIVE SARS-CoV-2 target nucleic acids are NOT DETECTED. The SARS-CoV-2 RNA is generally detectable in upper and lower  respiratory specimens during the acute phase of infection. The lowest  concentration of SARS-CoV-2 viral copies this assay can detect is 250  copies / mL. A negative result does not preclude SARS-CoV-2 infection  and should not be used as the sole basis for treatment or other  patient management decisions.  A negative  result may occur with  improper specimen collection / handling, submission of specimen other  than nasopharyngeal swab, presence of viral mutation(s) within the  areas targeted by this assay, and inadequate number of viral copies  (<250 copies / mL). A negative result must be combined with clinical  observations, patient history, and epidemiological information. If result is POSITIVE SARS-CoV-2 target nucleic acids are DETECTED. The SARS-CoV-2 RNA is generally detectable in upper and lower  respiratory specimens dur ing the acute phase of infection.  Positive  results are indicative of active infection with SARS-CoV-2.  Clinical  correlation with patient history and other diagnostic information is  necessary to determine patient infection status.  Positive results do  not rule out bacterial infection or co-infection with other viruses. If result is PRESUMPTIVE POSTIVE SARS-CoV-2 nucleic acids MAY BE PRESENT.   A presumptive positive result was obtained on the submitted specimen  and confirmed on repeat testing.  While 2019 novel coronavirus  (SARS-CoV-2) nucleic acids may be present in the submitted sample  additional confirmatory testing may be necessary for epidemiological  and / or clinical management purposes  to differentiate between  SARS-CoV-2 and other Sarbecovirus currently known to infect humans.  If clinically indicated additional testing with an alternate test  methodology [phone]) is advised. The SARS-CoV-2 RNA is generally  detectable in upper and lower respiratory sp ecimens during the acute  phase of infection. The expected result is Negative. Fact Sheet for Patients:  StrictlyIdeas.no Fact Sheet for Healthcare Providers: BankingDealers.co.za This test is not yet approved or cleared by the [place] FDA and has been authorized for detection and/or diagnosis of SARS-CoV-2 by FDA under an Emergency Use Authorization  (EUA).  This EUA will remain in effect (meaning this test can be used) for the duration of the COVID-19 declaration under Section 564(b)(1) of the Act, 21 U.S.C. section 360bbb-3(b)(1), unless the authorization is terminated or revoked sooner. Performed at [facility], [place] [address], [place], [place] [address]   Culture, blood (routine x 2)     Status: None (Preliminary result)   Collection Time: 01/26/19 10:40 PM   Specimen: BLOOD  Result Value Ref Range Status   Specimen Description BLOOD LEFT ANTECUBITAL  Final   Special Requests   Final  BOTTLES DRAWN AEROBIC AND ANAEROBIC Blood Culture adequate volume   Culture   Final    NO GROWTH 3 DAYS Performed at [facility], [place] [address] [place], [place] [address]    Report Status PENDING  Incomplete     Labs: BNP (last 3 results) No results for input(s): BNP in the last 8760 hours. Basic Metabolic Panel: Recent Labs  Lab 01/28/19 0029 01/28/19 0431 01/28/19 1131 01/28/19 1822 01/29/19 0435  NA 146* 145 140 138 136  K 4.9 3.2* 4.2 2.8* 3.4*  CL 118* 114* 110 110 105  CO2 11* 20* 17* 21* 21*  GLUCOSE 193* 137* 213* 143* 202*  BUN 11 7 7  <5* <5*  CREATININE 1.13* 0.96 0.96 0.76 0.81  CALCIUM 8.5* 8.5* 8.1* 7.6* 7.5*   Liver Function Tests: Recent Labs  Lab 01/26/19 1536 01/27/19 0606  AST 14* 16  ALT 16 14  ALKPHOS 113 95  BILITOT 1.6* 1.4*  PROT 8.6* 7.0  ALBUMIN 3.9 3.1*   Recent Labs  Lab 01/26/19 1536  LIPASE 17   No results for input(s): AMMONIA in the last 168 hours. CBC: Recent Labs  Lab 01/26/19 1536 01/26/19 1619 01/26/19 2240 01/27/19 1231 01/28/19 1822  WBC 20.5*  --  24.5* 22.8* 11.1*  NEUTROABS  --   --   --  17.8*  --   HGB 18.0* 18.7* 16.4* 16.0* 13.3  HCT 53.4* 55.0* 48.1* 47.3* 38.2  MCV 88.7  --  88.3 88.2 87.2  PLT 407*  --  371 307 234   Cardiac Enzymes: No results for input(s): CKTOTAL, CKMB, CKMBINDEX, TROPONINI in the last 168 hours. BNP: Invalid  input(s): POCBNP CBG: Recent Labs  Lab 01/29/19 0013 01/29/19 0421 01/29/19 0813 01/29/19 1152 01/29/19 1550  GLUCAP 128* 182* 213* 197* 160*   D-Dimer No results for input(s): DDIMER in the last 72 hours. Hgb A1c No results for input(s): HGBA1C in the last 72 hours. Lipid Profile No results for input(s): CHOL, HDL, LDLCALC, TRIG, CHOLHDL, LDLDIRECT in the last 72 hours. Thyroid function studies No results for input(s): TSH, T4TOTAL, T3FREE, THYROIDAB in the last 72 hours.  Invalid input(s): FREET3 Anemia work up No results for input(s): VITAMINB12, FOLATE, FERRITIN, TIBC, IRON, RETICCTPCT in the last 72 hours. Urinalysis    Component Value Date/Time   COLORURINE STRAW (A) 01/26/2019 1900   APPEARANCEUR CLEAR 01/26/2019 1900   LABSPEC 1.017 01/26/2019 1900   PHURINE 5.0 01/26/2019 1900   GLUCOSEU >=500 (A) 01/26/2019 1900   HGBUR SMALL (A) 01/26/2019 1900   BILIRUBINUR NEGATIVE 01/26/2019 1900   KETONESUR 80 (A) 01/26/2019 1900   PROTEINUR NEGATIVE 01/26/2019 1900   UROBILINOGEN 0.2 02/17/2015 1155   NITRITE NEGATIVE 01/26/2019 1900   LEUKOCYTESUR NEGATIVE 01/26/2019 1900   Sepsis Labs Invalid input(s): PROCALCITONIN,  WBC,  LACTICIDVEN Microbiology Recent Results (from the past 240 hour(s))  Culture, blood (routine x 2)     Status: None (Preliminary result)   Collection Time: 01/26/19  1:20 AM   Specimen: BLOOD  Result Value Ref Range Status   Specimen Description BLOOD RIGHT ARM  Final   Special Requests   Final    BOTTLES DRAWN AEROBIC AND ANAEROBIC Blood Culture adequate volume   Culture   Final    NO GROWTH 2 DAYS Performed at [facility], [address] [place], [place] [address]    Report Status PENDING  Incomplete  Urine culture     Status: None   Collection Time: 01/26/19  5:06 PM   Specimen: Urine, Random  Result Value Ref Range Status   Specimen Description URINE, RANDOM  Final   Special Requests NONE  Final   Culture   Final    NO  GROWTH Performed at [facility], [address], [place], [place] [address]    Report Status 01/27/2019 FINAL  Final  SARS Coronavirus 2 [facility] order, Performed in [facility] hospital lab) Nasopharyngeal Nasopharyngeal Swab     Status: None   Collection Time: 01/26/19  5:36 PM   Specimen: Nasopharyngeal Swab  Result Value Ref Range Status   SARS Coronavirus 2 NEGATIVE NEGATIVE Final    Comment: (NOTE) If result is NEGATIVE SARS-CoV-2 target nucleic acids are NOT DETECTED. The SARS-CoV-2 RNA is generally detectable in upper and lower  respiratory specimens during the acute phase of infection. The lowest  concentration of SARS-CoV-2 viral copies this assay can detect is 250  copies / mL. A negative result does not preclude SARS-CoV-2 infection  and should not be used as the sole basis for treatment or other  patient management decisions.  A negative result may occur with  improper specimen collection / handling, submission of specimen other  than nasopharyngeal swab, presence of viral mutation(s) within the  areas targeted by this assay, and inadequate number of viral copies  (<250 copies / mL). A negative result must be combined with clinical  observations, patient history, and epidemiological information. If result is POSITIVE SARS-CoV-2 target nucleic acids are DETECTED. The SARS-CoV-2 RNA is generally detectable in upper and lower  respiratory specimens dur ing the acute phase of infection.  Positive  results are indicative of active infection with SARS-CoV-2.  Clinical  correlation with patient history and other diagnostic information is  necessary to determine patient infection status.  Positive results do  not rule out bacterial infection or co-infection with other viruses. If result is PRESUMPTIVE POSTIVE SARS-CoV-2 nucleic acids MAY BE PRESENT.   A presumptive positive result was obtained on the submitted specimen  and confirmed on repeat testing.  While 2019  novel coronavirus  (SARS-CoV-2) nucleic acids may be present in the submitted sample  additional confirmatory testing may be necessary for epidemiological  and / or clinical management purposes  to differentiate between  SARS-CoV-2 and other Sarbecovirus currently known to infect humans.  If clinically indicated additional testing with an alternate test  methodology [phone]) is advised. The SARS-CoV-2 RNA is generally  detectable in upper and lower respiratory sp ecimens during the acute  phase of infection. The expected result is Negative. Fact Sheet for Patients:  StrictlyIdeas.no Fact Sheet for Healthcare Providers: BankingDealers.co.za This test is not yet approved or cleared by the [place] FDA and has been authorized for detection and/or diagnosis of SARS-CoV-2 by FDA under an Emergency Use Authorization (EUA).  This EUA will remain in effect (meaning this test can be used) for the duration of the COVID-19 declaration under Section 564(b)(1) of the Act, 21 U.S.C. section [id number])(1), unless the authorization is terminated or revoked sooner. Performed at [facility], [place] [address], [place], [place] [address]   Culture, blood (routine x 2)     Status: None (Preliminary result)   Collection Time: 01/26/19 10:40 PM   Specimen: BLOOD  Result Value Ref Range Status   Specimen Description BLOOD LEFT ANTECUBITAL  Final   Special Requests   Final    BOTTLES DRAWN AEROBIC AND ANAEROBIC Blood Culture adequate volume   Culture   Final  NO GROWTH 3 DAYS Performed at [facility], [place] [address], [place], [place] [address]    Report Status PENDING  Incomplete     Time coordinating discharge: 35 minutes  SIGNED:   Cordelia Poche, MD [facility] 01/29/2019, 5:36 PM

## 2019-01-29 NOTE — Progress Notes (Signed)
Initial Nutrition Assessment  DOCUMENTATION CODES:   Obesity unspecified  INTERVENTION:   -Glucerna Shake po TID, each supplement provides 220 kcal and 10 grams of protein -MVI with minerals daily -Provided "Carbohydrate Counting For People with Diabetes" handout from [facility] Nutrition Care manual  NUTRITION DIAGNOSIS:   Inadequate oral intake related to nausea, decreased appetite as evidenced by meal completion < 50%, per patient/family report.  GOAL:   Patient will meet greater than or equal to 90% of their needs  MONITOR:   PO intake, Supplement acceptance, Labs, Weight trends, Skin, I & O's  REASON FOR ASSESSMENT:   Consult Assessment of nutrition requirement/status, Diet education  ASSESSMENT:   Karen Stein is a 48 y.o. female with medical history significant of type 2 diabetes, asthma, sickle cell trait, prolonged QT, anxiety, hyperlipidemia who presented with symptoms of weakness, nausea and vomiting for the past 5 days.  She denies any fevers or chills.  Denies any abdominal pain or diarrhea.  Denies any chest pain or shortness of breath.  She endorses she has been compliant with her 10 units of insulin daily and was still taking it during the times when she felt ill.  She denies any tobacco, alcohol or illicit drug use.  She reports that for the past several months she has also been having dental pain and has not seek any medical care for this.  Pt admitted with DKA.  Reviewed I/O's: +693 ml x 24 hours and +2.3 L since admission   UOP: 2.1 L x 24 hours  Spoke with pt at bedside, who had a flat affect. She complains of nausea and poor oral intake; per her report, the only thing she has been able to tolerate is beef broth today and has not ordered lunch. Pt reports concern over her home situation, as her husband works night shift and is unable to help her during the day. She expresses fear of completing ADLs and showering, as she feels weak and is afraid she will fall  if he is not present when she completes these tasks. She also shares that food preparation is an issue for her, as she often feels too tired or in too much pain to prepare food for herself. She is requesting help from a home health agency for ADLs and meal preparation.   Pt shares that she has a dental infection in her back left molar. It does not hurt to eat or chew, but pt reports "food often gets stuck in it".   Reviewed wt hx; noted pt has experienced a 18.4% wt loss over the apst year. While not significant for time frame, this is concerning given poor oral intake. Suspect wt changes may be related to uncontrolled DM.   Assisted pt in ordering lunch meal. She is amenable to oral nutrition supplements due to poor oral intake.   Lab Results  Component Value Date   HGBA1C 9.0 (H) 08/01/2018   PTA DM medications are 25 mg jardiance daily, 4 mg glimpeiride daily, and 10 units insulin glargine q HS.   Labs reviewed: K: 3.4, CBGS: 128-182 (inpatient orders for glycemic control are 0-15 units insulin aspart TID with meals and 15 units insulin glargine q HS).   NUTRITION - FOCUSED PHYSICAL EXAM:    Most Recent Value  Orbital Region  No depletion  Upper Arm Region  No depletion  Thoracic and Lumbar Region  No depletion  Buccal Region  No depletion  Temple Region  No depletion  Clavicle Bone Region  No depletion  Clavicle and Acromion Bone Region  No depletion  Scapular Bone Region  No depletion  Dorsal Hand  No depletion  Patellar Region  No depletion  Anterior Thigh Region  No depletion  Posterior Calf Region  No depletion  Edema (RD Assessment)  None  Hair  Reviewed  Eyes  Reviewed  Mouth  Reviewed  Skin  Reviewed  Nails  Reviewed       Diet Order:   Diet Order            Diet - low sodium heart healthy        Diet Carb Modified Fluid consistency: Thin; Room service appropriate? Yes  Diet effective now              EDUCATION NEEDS:   Not appropriate for education at  this time  Skin:  Skin Assessment: Reviewed RN Assessment  Last BM:  01/27/19  Height:   Ht Readings from Last 1 Encounters:  01/26/19 5\' 8"  (1.727 m)    Weight:   Wt Readings from Last 1 Encounters:  01/29/19 96.2 kg    Ideal Body Weight:  63.6 kg  BMI:  Body mass index is 32.25 kg/m.  Estimated Nutritional Needs:   Kcal:  1700-1900  Protein:  95-110 grams  Fluid:  > 1.7 L    Karen Stein A. Jimmye Norman, RD, LDN, [place] Registered Dietitian II Certified Diabetes Care and Education Specialist Pager: [phone] After hours Pager: [phone]

## 2019-01-31 LAB — CULTURE, BLOOD (ROUTINE X 2)
Culture: NO GROWTH
Special Requests: ADEQUATE

## 2019-02-01 LAB — CULTURE, BLOOD (ROUTINE X 2)
Culture: NO GROWTH
Special Requests: ADEQUATE

## 2019-03-19 ENCOUNTER — Other Ambulatory Visit: Payer: Self-pay | Admitting: Obstetrics and Gynecology

## 2019-03-19 DIAGNOSIS — Z1231 Encounter for screening mammogram for malignant neoplasm of breast: Secondary | ICD-10-CM

## 2019-03-19 DIAGNOSIS — Z9189 Other specified personal risk factors, not elsewhere classified: Secondary | ICD-10-CM

## 2019-05-13 ENCOUNTER — Ambulatory Visit: Payer: BC Managed Care – PPO

## 2019-08-22 ENCOUNTER — Encounter (HOSPITAL_COMMUNITY): Payer: Self-pay | Admitting: Emergency Medicine

## 2019-08-22 ENCOUNTER — Other Ambulatory Visit: Payer: Self-pay

## 2019-08-22 ENCOUNTER — Emergency Department (HOSPITAL_COMMUNITY): Payer: BC Managed Care – PPO

## 2019-08-22 ENCOUNTER — Emergency Department (HOSPITAL_COMMUNITY)
Admission: EM | Admit: 2019-08-22 | Discharge: 2019-08-23 | Disposition: A | Discharge status: Home / Self Care | Payer: BC Managed Care – PPO | Attending: Emergency Medicine | Admitting: Emergency Medicine

## 2019-08-22 DIAGNOSIS — M5441 Lumbago with sciatica, right side: Secondary | ICD-10-CM | POA: Diagnosis not present

## 2019-08-22 DIAGNOSIS — K029 Dental caries, unspecified: Secondary | ICD-10-CM | POA: Diagnosis not present

## 2019-08-22 DIAGNOSIS — Z79899 Other long term (current) drug therapy: Secondary | ICD-10-CM | POA: Insufficient documentation

## 2019-08-22 DIAGNOSIS — Z765 Malingerer [conscious simulation]: Secondary | ICD-10-CM

## 2019-08-22 DIAGNOSIS — Z794 Long term (current) use of insulin: Secondary | ICD-10-CM | POA: Diagnosis not present

## 2019-08-22 DIAGNOSIS — R531 Weakness: Secondary | ICD-10-CM | POA: Diagnosis not present

## 2019-08-22 DIAGNOSIS — E1165 Type 2 diabetes mellitus with hyperglycemia: Secondary | ICD-10-CM | POA: Diagnosis not present

## 2019-08-22 DIAGNOSIS — R739 Hyperglycemia, unspecified: Secondary | ICD-10-CM

## 2019-08-22 DIAGNOSIS — M5431 Sciatica, right side: Secondary | ICD-10-CM

## 2019-08-22 DIAGNOSIS — N3000 Acute cystitis without hematuria: Secondary | ICD-10-CM | POA: Diagnosis not present

## 2019-08-22 DIAGNOSIS — R52 Pain, unspecified: Secondary | ICD-10-CM | POA: Diagnosis present

## 2019-08-22 LAB — DIFFERENTIAL
Abs Immature Granulocytes: 0.03 10*3/uL (ref 0.00–0.07)
Basophils Absolute: 0.1 10*3/uL (ref 0.0–0.1)
Basophils Relative: 1 %
Eosinophils Absolute: 0.1 10*3/uL (ref 0.0–0.5)
Eosinophils Relative: 2 %
Immature Granulocytes: 0 %
Lymphocytes Relative: 39 %
Lymphs Abs: 3.3 10*3/uL (ref 0.7–4.0)
Monocytes Absolute: 0.4 10*3/uL (ref 0.1–1.0)
Monocytes Relative: 4 %
Neutro Abs: 4.4 10*3/uL (ref 1.7–7.7)
Neutrophils Relative %: 54 %

## 2019-08-22 LAB — CBC
HCT: 44.2 % (ref 36.0–46.0)
Hemoglobin: 14.4 g/dL (ref 12.0–15.0)
MCH: 29.3 pg (ref 26.0–34.0)
MCHC: 32.6 g/dL (ref 30.0–36.0)
MCV: 89.8 fL (ref 80.0–100.0)
Platelets: 334 10*3/uL (ref 150–400)
RBC: 4.92 MIL/uL (ref 3.87–5.11)
RDW: 12.5 % (ref 11.5–15.5)
WBC: 8.2 10*3/uL (ref 4.0–10.5)
nRBC: 0 % (ref 0.0–0.2)

## 2019-08-22 NOTE — ED Triage Notes (Addendum)
Patient was sent by pain management care doctor for right sided leg pain/weakness (pt is seen at the pain clinic for this). Her provider is concerned that she has had a stroke. Patient's provider had written an order for an MRI which pt states she did not follow through on. States she has not been able to get blood sugars under control and she also has a dental abscess.

## 2019-08-22 NOTE — ED Notes (Signed)
Date and time results received: 08/22/19 10:08 PM   Test: Glucose Critical Value: 627  Name of Provider Notified: Tyrone Nine MD

## 2019-08-23 ENCOUNTER — Encounter (HOSPITAL_COMMUNITY): Payer: Self-pay | Admitting: Emergency Medicine

## 2019-08-23 ENCOUNTER — Emergency Department (HOSPITAL_COMMUNITY): Payer: BC Managed Care – PPO

## 2019-08-23 LAB — URINALYSIS, ROUTINE W REFLEX MICROSCOPIC
Bilirubin Urine: NEGATIVE
Glucose, UA: >=500 mg/dL — AB
Hgb urine dipstick: NEGATIVE
Ketones, ur: NEGATIVE mg/dL
Nitrite: POSITIVE — AB
Protein, ur: NEGATIVE mg/dL
Specific Gravity, Urine: 1.022 (ref 1.005–1.030)
pH: 5 (ref 5.0–8.0)

## 2019-08-23 LAB — COMPREHENSIVE METABOLIC PANEL
ALT: 35 U/L (ref 0–44)
AST: 18 U/L (ref 15–41)
Albumin: 3.2 g/dL — ABNORMAL LOW (ref 3.5–5.0)
Alkaline Phosphatase: 154 U/L — ABNORMAL HIGH (ref 38–126)
Anion gap: 10 (ref 5–15)
BUN: 13 mg/dL (ref 6–20)
CO2: 23 mmol/L (ref 22–32)
Calcium: 8.5 mg/dL — ABNORMAL LOW (ref 8.9–10.3)
Chloride: 94 mmol/L — ABNORMAL LOW (ref 98–111)
Creatinine, Ser: 0.91 mg/dL (ref 0.44–1.00)
GFR calc Af Amer: >60 mL/min (ref >60)
GFR calc non Af Amer: >60 mL/min (ref >60)
Glucose, Bld: 627 mg/dL (ref 70–99)
Potassium: 4.5 mmol/L (ref 3.5–5.1)
Sodium: 127 mmol/L — ABNORMAL LOW (ref 135–145)
Total Bilirubin: 0.6 mg/dL (ref 0.3–1.2)
Total Protein: 7.3 g/dL (ref 6.5–8.1)

## 2019-08-23 LAB — CBG MONITORING, ED
Glucose-Capillary: 211 mg/dL — ABNORMAL HIGH (ref 70–99)
Glucose-Capillary: 269 mg/dL — ABNORMAL HIGH (ref 70–99)
Glucose-Capillary: 446 mg/dL — ABNORMAL HIGH (ref 70–99)

## 2019-08-23 MED ORDER — LORAZEPAM 2 MG/ML IJ SOLN
1.0000 mg | Freq: Once | INTRAMUSCULAR | Status: AC
  Administered 2019-08-23: 1 mg via INTRAMUSCULAR
  Filled 2019-08-23: qty 1

## 2019-08-23 MED ORDER — LIDOCAINE 5 % EX PTCH
1.0000 | MEDICATED_PATCH | CUTANEOUS | Status: DC
  Administered 2019-08-23: 1 via TRANSDERMAL
  Filled 2019-08-23: qty 1

## 2019-08-23 MED ORDER — CLINDAMYCIN HCL 150 MG PO CAPS
300.0000 mg | ORAL_CAPSULE | ORAL | Status: AC
  Administered 2019-08-23: 300 mg via ORAL
  Filled 2019-08-23: qty 2

## 2019-08-23 MED ORDER — KETOROLAC TROMETHAMINE 60 MG/2ML IM SOLN
60.0000 mg | Freq: Once | INTRAMUSCULAR | Status: AC
  Administered 2019-08-23: 60 mg via INTRAMUSCULAR
  Filled 2019-08-23: qty 2

## 2019-08-23 MED ORDER — INSULIN ASPART 100 UNIT/ML ~~LOC~~ SOLN
2.0000 [IU] | Freq: Once | SUBCUTANEOUS | Status: AC
  Administered 2019-08-23: 2 [IU] via SUBCUTANEOUS

## 2019-08-23 MED ORDER — CLINDAMYCIN HCL 300 MG PO CAPS
300.0000 mg | ORAL_CAPSULE | Freq: Four times a day (QID) | ORAL | 0 refills | Status: DC
Start: 2019-08-23 — End: 2021-05-26

## 2019-08-23 MED ORDER — LIDOCAINE 5 % EX PTCH: 1.0000 | MEDICATED_PATCH | CUTANEOUS | 0 refills | Status: AC

## 2019-08-23 MED ORDER — FOSFOMYCIN TROMETHAMINE 3 G PO PACK
3.0000 g | PACK | Freq: Once | ORAL | Status: AC
  Administered 2019-08-23: 3 g via ORAL
  Filled 2019-08-23: qty 3

## 2019-08-23 MED ORDER — INSULIN ASPART 100 UNIT/ML ~~LOC~~ SOLN: 2.0000 [IU] | Freq: Once | SUBCUTANEOUS | Status: DC

## 2019-08-23 MED ORDER — INSULIN ASPART 100 UNIT/ML ~~LOC~~ SOLN
8.0000 [IU] | SUBCUTANEOUS | Status: AC
  Administered 2019-08-23: 8 [IU] via SUBCUTANEOUS

## 2019-08-23 NOTE — ED Notes (Signed)
Pt verbalized understanding of d/c instructions, follow up care and s/s requiring return to ed. Pt had no additional questions at this time and was transported to the exit via wheelchair.

## 2019-08-23 NOTE — ED Provider Notes (Addendum)
[facility]GENC[facility] DEPARTMENT Provider Note   CSN: [id number] Arrival date & time: 08/22/19  1943     History Chief Complaint  Patient presents with  . Multiple Complaints    Karen Stein is a 49 y.o. female.  The history is provided by the patient.  Back Pain Location:  Gluteal region Quality:  Shooting Radiates to:  R posterior upper leg and R foot Pain severity:  Severe Pain is:  Same all the time Onset quality:  Gradual Timing:  Constant Progression:  Worsening Chronicity:  Chronic Context: not emotional stress, not falling, not jumping from heights, not lifting heavy objects, not MCA, not MVA, not occupational injury and not pedestrian accident   Relieved by:  Nothing Worsened by:  Nothing Ineffective treatments:  None tried Associated symptoms: no abdominal pain, no abdominal swelling, no bladder incontinence, no bowel incontinence, no chest pain, no dysuria, no fever, no headaches, no numbness, no paresthesias, no pelvic pain, no perianal numbness, no tingling, no weakness and no weight loss   Risk factors: no hx of cancer and no recent surgery   Patient with fibromyalgia and chronic pain presents with many pain complaints. First is R sciatics and she is also complaining of multiple dental abscesses and inability to pain for dental care that she reports are secondary to neurontin causing dry mouth and rotting her teeth.  She states the pain in her back and her leg has been going on for months and she was not able to go to the MRI that was scheduled over a month ago for this issue. She reports she was sent here because she was let go from pain management.  Moreover, she is not taking her insulin and has been eating carbs because her teeth hurt.  No incontinence, no numbness.       Past Medical History:  Diagnosis Date  . Anemia   . Anxiety   . Arthritis   . Chronic pain   . Complication of anesthesia    anesthesia awareness,  . DDD  (degenerative disc disease)   . DDD (degenerative disc disease)   . Degenerative disc disease   . Degenerative disc disease   . Diabetes mellitus    Type 2 insulin resistant  . Dysrhythmia    stress and caffeine related  . Endometriosis   . Fibroids   . Fibromyalgia   . GERD (gastroesophageal reflux disease)   . High cholesterol   . Insomnia   . Leg cramps   . PCOS (polycystic ovarian syndrome)   . Scoliosis   . Sickle cell trait [facility])     Patient Active Problem List   Diagnosis Date Noted  . DKA (diabetic ketoacidoses) ([place]) 01/26/2019  . Esophagitis   . Hyperbilirubinemia   . AKI (acute kidney injury) ([place])   . OSA on CPAP   . Lacunar infarction ([place])   . HLD (hyperlipidemia) 07/31/2018  . Prolonged QT interval 07/31/2018  . Chest pain 07/31/2018  . Nausea vomiting and diarrhea 07/31/2018  . Abdominal pain 07/31/2018  . Elevated blood protein 07/31/2018  . Diabetes mellitus without complication ([place]) 36/14/4315  . Anxiety   . Depression 08/25/2017  . 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 Degenerative disc disease   . Sickle cell trait ([place])   . DDD (degenerative disc disease), lumbar     Past Surgical History:  Procedure Laterality Date  . BACK SURGERY  2010  . BIOPSY BREAST    .  CESAREAN SECTION    . DILATATION & CURETTAGE/HYSTEROSCOPY WITH MYOSURE N/A 01/01/2015   Procedure: DILATATION & CURETTAGE/HYSTEROSCOPY WITH MYOSURE;  Surgeon: Crawford Givens, MD;  Location: [facility];  Service: Gynecology;  Laterality: N/A;  . LAPAROSCOPY  2002 or 2003  . TOE SURGERY  2008  . WISDOM TOOTH EXTRACTION       OB History    Gravida  4   Para  2   Term  1   Preterm  1   AB  2   Living  1     SAB  2   TAB      Ectopic      Multiple      Live Births              Family History  Problem Relation Age of Onset  . Diabetes Father   . Hypertension Father   . Hyperlipidemia Father   . Kidney failure  Father   . Diabetes Paternal Grandfather   . Hypertension Paternal Grandfather   . Hyperlipidemia Paternal Grandfather   . Diabetes Paternal Grandmother   . Hypertension Paternal Grandmother   . Hyperlipidemia Paternal Grandmother   . Alzheimer's disease Maternal Grandmother   . Brain cancer Maternal Grandfather     Social History   Tobacco Use  . Smoking status: Never Smoker  . Smokeless tobacco: Never Used  Substance Use Topics  . Alcohol use: No    Alcohol/week: 0.0 standard drinks  . Drug use: No    Home Medications Prior to Admission medications   Medication Sig Start Date End Date Taking? Authorizing Provider  atorvastatin (LIPITOR) 20 MG tablet Take 1 tablet (20 mg total) by mouth daily at 6 PM. 08/01/18   Black, Lezlie Octave, NP  clindamycin (CLEOCIN) 300 MG capsule Take 1 capsule (300 mg total) by mouth 4 (four) times daily. X 7 days 08/23/19   Joylynn Defrancesco, MD  DULoxetine (CYMBALTA) 30 MG capsule Take 30 mg by mouth at bedtime. 01/04/19   [provider]  empagliflozin (JARDIANCE) 25 MG TABS tablet Take 25 mg by mouth daily.    [provider]  gabapentin (NEURONTIN) 300 MG capsule Take 600 mg by mouth 3 (three) times daily. 06/17/18   [provider]  glimepiride (AMARYL) 4 MG tablet Take 4 mg by mouth daily. 11/15/18   [provider]  insulin glargine (LANTUS) 100 UNIT/ML injection Inject 0.15 mLs (15 Units total) into the skin at bedtime. 01/29/19   Mariel Aloe, MD  lidocaine (LIDODERM) 5 % Place 1 patch onto the skin daily. Remove & Discard patch within 12 hours or as directed by MD 08/23/19   Randal Buba, Analaura Messler, MD  metoCLOPramide (REGLAN) 5 MG tablet Take 1 tablet (5 mg total) by mouth every 6 (six) hours as needed for nausea. Patient not taking: Reported on 08/08/2018 08/01/18   Radene Gunning, NP  metoprolol tartrate (LOPRESSOR) 25 MG tablet Take 1 tablet (25 mg total) by mouth 2 (two) times daily. Patient not taking: Reported on 01/17/2019  08/01/18   Radene Gunning, NP  oxycodone (ROXICODONE) 30 MG immediate release tablet Take 30 mg by mouth every 6 (six) hours as needed for pain. 01/04/19   [provider]  pantoprazole (PROTONIX) 40 MG tablet Take 1 tablet (40 mg total) by mouth daily. 01/30/19   Mariel Aloe, MD  tiZANidine (ZANAFLEX) 4 MG tablet Take 4 mg by mouth 3 (three) times daily. 01/11/19   [provider]  Allergies    Amoxicillin, Macrobid [nitrofurantoin macrocrystal], Shellfish allergy, and Sulfa antibiotics  Review of Systems   Review of Systems  Constitutional: Negative for fever and weight loss.  HENT: Positive for dental problem.   Eyes: Negative for visual disturbance.  Respiratory: Negative for shortness of breath.   Cardiovascular: Negative for chest pain.  Gastrointestinal: Negative for abdominal pain and bowel incontinence.  Genitourinary: Negative for bladder incontinence, difficulty urinating, dysuria and pelvic pain.  Musculoskeletal: Positive for back pain. Negative for gait problem.  Skin: Negative for wound.  Neurological: Negative for tingling, weakness, numbness, headaches and paresthesias.  Psychiatric/Behavioral: Negative for agitation.    Physical Exam Updated Vital Signs BP 126/84 (BP Location: Right Arm)   Pulse 85   Temp 98.6 F (37 C) (Oral)   Resp 16   LMP 11/13/2015 (Exact Date)   SpO2 99%   Physical Exam Vitals and nursing note reviewed.  Constitutional:      Appearance: Normal appearance. She is not ill-appearing.  HENT:     Head: Normocephalic and atraumatic.     Nose: Nose normal.     Mouth/Throat:     Mouth: Mucous membranes are moist.     Pharynx: Oropharynx is clear.  Eyes:     Extraocular Movements: Extraocular movements intact.     Conjunctiva/sclera: Conjunctivae normal.     Pupils: Pupils are equal, round, and reactive to light.  Cardiovascular:     Rate and Rhythm: Normal rate and regular rhythm.     Pulses: Normal pulses.      Heart sounds: Normal heart sounds.  Pulmonary:     Effort: Pulmonary effort is normal.     Breath sounds: Normal breath sounds.  Abdominal:     General: Abdomen is flat. Bowel sounds are normal.     Tenderness: There is no abdominal tenderness. There is no guarding or rebound.  Musculoskeletal:        General: No swelling or tenderness. Normal range of motion.     Cervical back: Normal range of motion and neck supple.     Right lower leg: No edema.     Left lower leg: No edema.  Skin:    General: Skin is warm and dry.     Capillary Refill: Capillary refill takes less than 2 seconds.     Findings: No rash.  Neurological:     General: No focal deficit present.     Mental Status: She is alert and oriented to person, place, and time.     Sensory: No sensory deficit.     Motor: No weakness.     Deep Tendon Reflexes: Reflexes normal.  Psychiatric:        Mood and Affect: Mood normal.        Behavior: Behavior normal.     ED Results / Procedures / Treatments   Labs (all labs ordered are listed, but only abnormal results are displayed) Results for orders placed or performed during the hospital encounter of 08/22/19  CBC  Result Value Ref Range   WBC 8.2 4.0 - 10.5 K/uL   RBC 4.92 3.87 - 5.11 MIL/uL   Hemoglobin 14.4 12.0 - 15.0 g/dL   HCT 44.2 36.0 - 46.0 %   MCV 89.8 80.0 - 100.0 fL   MCH 29.3 26.0 - 34.0 pg   MCHC 32.6 30.0 - 36.0 g/dL   RDW 12.5 11.5 - 15.5 %   Platelets 334 150 - 400 K/uL   nRBC 0.0 0.0 - 0.2 %  Differential  Result Value Ref Range   Neutrophils Relative % 54 %   Neutro Abs 4.4 1.7 - 7.7 K/uL   Lymphocytes Relative 39 %   Lymphs Abs 3.3 0.7 - 4.0 K/uL   Monocytes Relative 4 %   Monocytes Absolute 0.4 0.1 - 1.0 K/uL   Eosinophils Relative 2 %   Eosinophils Absolute 0.1 0.0 - 0.5 K/uL   Basophils Relative 1 %   Basophils Absolute 0.1 0.0 - 0.1 K/uL   Immature Granulocytes 0 %   Abs Immature Granulocytes 0.03 0.00 - 0.07 K/uL  Comprehensive  metabolic panel  Result Value Ref Range   Sodium 127 (L) 135 - 145 mmol/L   Potassium 4.5 3.5 - 5.1 mmol/L   Chloride 94 (L) 98 - 111 mmol/L   CO2 23 22 - 32 mmol/L   Glucose, Bld 627 (HH) 70 - 99 mg/dL   BUN 13 6 - 20 mg/dL   Creatinine, Ser 0.91 0.44 - 1.00 mg/dL   Calcium 8.5 (L) 8.9 - 10.3 mg/dL   Total Protein 7.3 6.5 - 8.1 g/dL   Albumin 3.2 (L) 3.5 - 5.0 g/dL   AST 18 15 - 41 U/L   ALT 35 0 - 44 U/L   Alkaline Phosphatase 154 (H) 38 - 126 U/L   Total Bilirubin 0.6 0.3 - 1.2 mg/dL   GFR calc non Af Amer >60 >60 mL/min   GFR calc Af Amer >60 >60 mL/min   Anion gap 10 5 - 15  Urinalysis, Routine w reflex microscopic  Result Value Ref Range   Color, Urine YELLOW YELLOW   APPearance HAZY (A) CLEAR   Specific Gravity, Urine 1.022 1.005 - 1.030   pH 5.0 5.0 - 8.0   Glucose, UA >=500 (A) NEGATIVE mg/dL   Hgb urine dipstick NEGATIVE NEGATIVE   Bilirubin Urine NEGATIVE NEGATIVE   Ketones, ur NEGATIVE NEGATIVE mg/dL   Protein, ur NEGATIVE NEGATIVE mg/dL   Nitrite POSITIVE (A) NEGATIVE   Leukocytes,Ua TRACE (A) NEGATIVE   RBC / HPF 0-5 0 - 5 RBC/hpf   WBC, UA 0-5 0 - 5 WBC/hpf   Bacteria, UA RARE (A) NONE SEEN   Squamous Epithelial / LPF 0-5 0 - 5   Mucus PRESENT   CBG monitoring, ED  Result Value Ref Range   Glucose-Capillary 446 (H) 70 - 99 mg/dL  CBG monitoring, ED  Result Value Ref Range   Glucose-Capillary 269 (H) 70 - 99 mg/dL   CT HEAD WO CONTRAST  Result Date: 08/22/2019 CLINICAL DATA:  Right-sided leg pain and weakness EXAM: CT HEAD WITHOUT CONTRAST TECHNIQUE: Contiguous axial images were obtained from the base of the skull through the vertex without intravenous contrast. COMPARISON:  01/26/2019 FINDINGS: Brain: Chronic small vessel ischemic changes are seen within the right frontal periventricular white matter. No signs of acute infarct or hemorrhage. Lateral ventricles and midline structures are unremarkable. No acute extra-axial fluid collections. No mass effect.  Vascular: No hyperdense vessel or unexpected calcification. Skull: Normal. Negative for fracture or focal lesion. Sinuses/Orbits: No acute finding. Other: None. IMPRESSION: 1. Stable exam, no acute process. Electronically Signed   By: Randa Ngo M.D.   On: 08/22/2019 21:18   MR BRAIN WO CONTRAST  Result Date: 08/23/2019 CLINICAL DATA:  Initial evaluation for acute weakness. EXAM: MRI HEAD WITHOUT CONTRAST TECHNIQUE: Multiplanar, multiecho pulse sequences of the brain and surrounding structures were obtained without intravenous contrast. COMPARISON:  Prior head CT from 08/22/2019. FINDINGS: Brain: Cerebral volume within normal limits  for patient age. Subcentimeter remote lacunar infarct present at the right caudate head. No abnormal foci of restricted diffusion to suggest acute or subacute ischemia. Gray-white matter differentiation well maintained. No encephalomalacia to suggest chronic infarction. No foci of susceptibility artifact to suggest acute or chronic intracranial hemorrhage. No mass lesion, midline shift or mass effect. No hydrocephalus. No extra-axial fluid collection. Major dural sinuses are grossly patent. Pituitary gland and suprasellar region are normal. Midline structures intact and normal. Vascular: Major intracranial vascular flow voids well maintained and normal in appearance. Skull and upper cervical spine: Craniocervical junction normal. Visualized upper cervical spine within normal limits. Bone marrow signal intensity normal. No scalp soft tissue abnormality. Scaphocephaly noted. Sinuses/Orbits: Globes and orbital soft tissues within normal limits. Paranasal sinuses are clear. No mastoid effusion. Inner ear structures normal. Other: None. IMPRESSION: 1. No acute intracranial abnormality. 2. 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 3. Otherwise normal brain MRI. Electronically Signed   By: Jeannine Boga M.D.   On: 08/23/2019 02:32   MR LUMBAR SPINE WO  CONTRAST  Result Date: 08/23/2019 CLINICAL DATA:  Initial evaluation for acute back pain. EXAM: MRI LUMBAR SPINE WITHOUT CONTRAST TECHNIQUE: Multiplanar, multisequence MR imaging of the lumbar spine was performed. No intravenous contrast was administered. COMPARISON:  Prior MRI from 06/23/2018. FINDINGS: Segmentation: Standard. Lowest well-formed disc space labeled the L5-S1 level. Alignment: Levoscoliosis with mild straightening of the normal lumbar lordosis. Trace retrolisthesis of L2 on L3 and L3 on L4. Vertebrae: Susceptibility artifact from prior PLIF present at L5-S1. Vertebral body height maintained without evidence for acute or chronic fracture. Bone marrow signal intensity within normal limits. No discrete or worrisome osseous lesions. Prominent discogenic reactive endplate changes present about the L2-3 and L3-4 interspaces related to scoliotic curvature. No abnormal marrow edema. Conus medullaris and cauda equina: Conus extends to the L1 level. Conus and cauda equina appear normal. Paraspinal and other soft tissues: Paraspinous soft tissues within normal limits. Chronic postoperative changes present within the lower posterior paraspinous soft tissues. Visualized visceral structures within normal limits. Disc levels: T11-12: Mild disc bulge with disc desiccation. Left greater than right facet hypertrophy. No stenosis. T12-L1: Negative interspace. Right-sided facet hypertrophy. No stenosis. L1-2:  Negative interspace.  Mild facet hypertrophy.  No stenosis. L2-3: Chronic intervertebral disc space narrowing with diffuse disc bulge and disc desiccation. Reactive endplate changes with marginal endplate osteophytic spurring. Mild bilateral facet hypertrophy. Resultant mild right lateral recess stenosis. Central canal remains patent. No significant foraminal stenosis. L3-4: Chronic intervertebral disc space narrowing with diffuse disc bulge and disc desiccation. Superimposed left foraminal to extraforaminal  disc protrusion contacts the exiting left L3 nerve root as it courses of the left neural foramen (series 10, image 4). Mild to moderate bilateral facet hypertrophy. Mild right lateral recess narrowing without significant spinal stenosis. Foramina remain patent. L4-5: Negative interspace. Moderate facet hypertrophy. No canal or foraminal stenosis. L5-S1: Postoperative changes related to prior PLIF. No residual canal or lateral recess stenosis. Foramina are widely patent. IMPRESSION: 1. No acute abnormality within the lumbar spine. 2. Left foraminal to extraforaminal disc protrusion at L3-4, contacting and potentially affecting the exiting left L3 nerve root. 3. Additional lumbar degenerative spondylosis at L2-3 and L3-4 with resultant mild right lateral recess stenosis. 4. Prior PLIF at L5-S1 without adverse features. No residual or recurrent stenosis. Electronically Signed   By: Jeannine Boga M.D.   On: 08/23/2019 03:04    EKG EKG Interpretation  Date/Time:  Friday Sherie Dobrowolski 23 2021  20:18:52 EDT Ventricular Rate:  88 PR Interval:  154 QRS Duration: 80 QT Interval:  354 QTC Calculation: 428 R Axis:   20 Text Interpretation: Normal sinus rhythm Possible Left atrial enlargement Septal infarct , age undetermined Confirmed by Randal Buba, Deva Ron ([id number]) on 08/22/2019 11:09:53 PM   Radiology CT HEAD WO CONTRAST  Result Date: 08/22/2019 CLINICAL DATA:  Right-sided leg pain and weakness EXAM: CT HEAD WITHOUT CONTRAST TECHNIQUE: Contiguous axial images were obtained from the base of the skull through the vertex without intravenous contrast. COMPARISON:  01/26/2019 FINDINGS: Brain: Chronic small vessel ischemic changes are seen within the right frontal periventricular white matter. No signs of acute infarct or hemorrhage. Lateral ventricles and midline structures are unremarkable. No acute extra-axial fluid collections. No mass effect. Vascular: No hyperdense vessel or unexpected calcification. Skull: Normal.  Negative for fracture or focal lesion. Sinuses/Orbits: No acute finding. Other: None. IMPRESSION: 1. Stable exam, no acute process. Electronically Signed   By: Randa Ngo M.D.   On: 08/22/2019 21:18    Procedures Procedures (including critical care time)  Medications Ordered in ED Medications  lidocaine (LIDODERM) 5 % 1 patch (1 patch Transdermal Patch Applied 08/23/19 0056)  insulin aspart (novoLOG) injection 2 Units (has no administration in time range)  insulin aspart (novoLOG) injection 8 Units (8 Units Subcutaneous Given 08/23/19 0101)  LORazepam (ATIVAN) injection 1 mg (1 mg Intramuscular Given 08/23/19 0053)  clindamycin (CLEOCIN) capsule 300 mg (300 mg Oral Given 08/23/19 0101)  ketorolac (TORADOL) injection 60 mg (60 mg Intramuscular Given 08/23/19 0053)  fosfomycin (MONUROL) packet 3 g (3 g Oral Given 08/23/19 0310)  insulin aspart (novoLOG) injection 2 Units (2 Units Subcutaneous Given 08/23/19 0345)    ED Course  I have reviewed the triage vital signs and the nursing notes.  Pertinent labs & imaging results that were available during my care of the patient were reviewed by me and considered in my medical decision making (see chart for details).   I have informed the patient she will not be receiving narcotics.  I have treated her dental disease with clindamycin and the UTI with fosfomycin that she is not allergic to.    Patient has Case d/w Dr. Cheral Marker, the remote lacune in the Caudate is not the source of the patient's pain nor weakness in the RLE.  There are no acute nerve findings in the L spine to explain the patient's reported Right sided symptoms and the patient is not objectively week on exam.  There is no indication for admission nor neurosurgical consultation at this time.  Patient admitted to being let go from pain management.  She has a myriad of pain complaints. I believe this is drug seeking behavior.  I have sent clindamycin and lidoderm to the pharmacy and have  printed the dental resource guide.  I have treated the sugar and it is markedly improved.  There was not gap and no signs of DKA.  Patient is advised that she must start taking her insulin.    Karen Stein was evaluated in Emergency Department on 08/23/2019 for the symptoms described in the history of present illness. She was evaluated in the context of the global COVID-19 pandemic, which necessitated consideration that the patient might be at risk for infection with the SARS-CoV-2 virus that causes COVID-19. Institutional protocols and algorithms that pertain to the evaluation of patients at risk for COVID-19 are in a state of rapid change based on information released by regulatory bodies including the CDC and  federal and state organizations. These policies and algorithms were followed during the patient's care in the ED.   Final Clinical Impression(s) / ED Diagnoses Final diagnoses:  Acute cystitis without hematuria  Dental caries  Sciatica of right side   Return for weakness, numbness, changes in vision or speech, fevers >100.4 unrelieved by medication, shortness of breath, intractable vomiting, or diarrhea, abdominal pain, Inability to tolerate liquids or food, cough, altered mental status or any concerns. No signs of systemic illness or infection. The patient is nontoxic-appearing on exam and vital signs are within normal limits.   I have reviewed the triage vital signs and the nursing notes. Pertinent labs &imaging results that were available during my care of the patient were reviewed by me and considered in my medical decision making (see chart for details).  After history, exam, and medical workup I feel the patient has been appropriately medically screened and is safe for discharge home. Pertinent diagnoses were discussed with the patient. Patient was givenstrictreturn precautions.  Rx / DC Orders ED Discharge Orders         Ordered    clindamycin (CLEOCIN) 300 MG capsule   4 times daily     08/23/19 0131    lidocaine (LIDODERM) 5 %  Every 24 hours     08/23/19 0131           Karel Mowers, MD 08/23/19 [place], Natania Finigan, MD 08/23/19 2919

## 2019-09-09 ENCOUNTER — Encounter: Payer: Self-pay | Admitting: Neurology

## 2019-11-20 NOTE — Progress Notes (Deleted)
NEUROLOGY CONSULTATION NOTE  Karen Stein MRN: [id number] DOB: 11/27/1970  Referring provider: [name], MD Primary care provider: [name], MD  Reason for consult:  Right sided weakness and balance abnormality  HISTORY OF PRESENT ILLNESS: Karen Stein is a 49 year old ***-handed female with type 2 diabetes mellitus, sickle cell trait, scoliosis, degenerative disc disease, arthritis and chronic pain syndrome who presents for right sided weakness and balance abnormality.  History supplemented by referring provider's note.   She has history of fibromyalgia as well as chronic low back pain since undergoing back surgery at age 52.  She has been treated by pain management without much success.  She reports worsening lower extremity weakness and problems with balance.  ***  MRI of brain without contrast from 08/23/2019 personally reviewed showed small remote lacunar infarct at right caudate head, as seen on previous images from September 2020, but no acute intracranial abnormality.  MRI of lumbar spine personally reviewed and showed degenerative lumbar spondylosis with left sided disc protrusion at L3-4 potentially affecting left L3 nerve root and other degenerative changes causing mild right lateral recess stenosis at L2-3 and L3-4.  Prior PLIF at L5-S1 noted without stenosis.    PAST MEDICAL HISTORY: Past Medical History:  Diagnosis Date  . Anemia   . Anxiety   . Arthritis   . Chronic pain   . Complication of anesthesia    anesthesia awareness,  . DDD (degenerative disc disease)   . DDD (degenerative disc disease)   . Degenerative disc disease   . Degenerative disc disease   . Diabetes mellitus    Type 2 insulin resistant  . Dysrhythmia    stress and caffeine related  . Endometriosis   . Fibroids   . Fibromyalgia   . GERD (gastroesophageal reflux disease)   . High cholesterol   . Insomnia   . Leg cramps   . PCOS (polycystic ovarian syndrome)   .  Scoliosis   . Sickle cell trait ([place])     PAST SURGICAL HISTORY: Past Surgical History:  Procedure Laterality Date  . BACK SURGERY  2010  . BIOPSY BREAST    . CESAREAN SECTION    . DILATATION & CURETTAGE/HYSTEROSCOPY WITH MYOSURE N/A 01/01/2015   Procedure: DILATATION & CURETTAGE/HYSTEROSCOPY WITH MYOSURE;  Surgeon: Crawford Givens, MD;  Location: [facility];  Service: Gynecology;  Laterality: N/A;  . LAPAROSCOPY  2002 or 2003  . TOE SURGERY  2008  . WISDOM TOOTH EXTRACTION      MEDICATIONS: Current Outpatient Medications on File Prior to Visit  Medication Sig Dispense Refill  . atorvastatin (LIPITOR) 20 MG tablet Take 1 tablet (20 mg total) by mouth daily at 6 PM. 30 tablet 1  . clindamycin (CLEOCIN) 300 MG capsule Take 1 capsule (300 mg total) by mouth 4 (four) times daily. X 7 days 28 capsule 0  . DULoxetine (CYMBALTA) 30 MG capsule Take 30 mg by mouth at bedtime.    . empagliflozin (JARDIANCE) 25 MG TABS tablet Take 25 mg by mouth daily.    Marland Kitchen gabapentin (NEURONTIN) 300 MG capsule Take 600 mg by mouth 3 (three) times daily.    Marland Kitchen glimepiride (AMARYL) 4 MG tablet Take 4 mg by mouth daily.    . insulin glargine (LANTUS) 100 UNIT/ML injection Inject 0.15 mLs (15 Units total) into the skin at bedtime. 10 mL 0  . lidocaine (LIDODERM) 5 % Place 1 patch onto the skin daily. Remove & Discard patch within 12 hours or  as directed by MD 30 patch 0  . metoCLOPramide (REGLAN) 5 MG tablet Take 1 tablet (5 mg total) by mouth every 6 (six) hours as needed for nausea. (Patient not taking: Reported on 08/08/2018) 90 tablet 1  . metoprolol tartrate (LOPRESSOR) 25 MG tablet Take 1 tablet (25 mg total) by mouth 2 (two) times daily. (Patient not taking: Reported on 01/17/2019) 60 tablet 1  . oxycodone (ROXICODONE) 30 MG immediate release tablet Take 30 mg by mouth every 6 (six) hours as needed for pain.    . pantoprazole (PROTONIX) 40 MG tablet Take 1 tablet (40 mg total) by mouth daily. 30 tablet 0  . tiZANidine  (ZANAFLEX) 4 MG tablet Take 4 mg by mouth 3 (three) times daily.     No current facility-administered medications on file prior to visit.    ALLERGIES: Allergies  Allergen Reactions  . Amoxicillin   . Macrobid [Nitrofurantoin Macrocrystal] Nausea Only    Headache and "severe abd cramps"  . Shellfish Allergy Swelling    Itching of lips and ears, NOT allergic to iodine contrast  . Sulfa Antibiotics Other (See Comments)    Dizzy and sees spots with sulfa drugs    FAMILY HISTORY: Family History  Problem Relation Age of Onset  . Diabetes Father   . Hypertension Father   . Hyperlipidemia Father   . Kidney failure Father   . Diabetes Paternal Grandfather   . Hypertension Paternal Grandfather   . Hyperlipidemia Paternal Grandfather   . Diabetes Paternal Grandmother   . Hypertension Paternal Grandmother   . Hyperlipidemia Paternal Grandmother   . Alzheimer's disease Maternal Grandmother   . Brain cancer Maternal Grandfather    ***.  SOCIAL HISTORY: Social History   Socioeconomic History  . Marital status: Married    Spouse name: Not on file  . Number of children: Not on file  . Years of education: Not on file  . Highest education level: Not on file  Occupational History  . Not on file  Tobacco Use  . Smoking status: Never Smoker  . Smokeless tobacco: Never Used  Vaping Use  . Vaping Use: Never used  Substance and Sexual Activity  . Alcohol use: No    Alcohol/week: 0.0 standard drinks  . Drug use: No  . Sexual activity: Not on file  Other Topics Concern  . Not on file  Social History Narrative  . Not on file   Social Determinants of Health   Financial Resource Strain:   . Difficulty of Paying Living Expenses:   Food Insecurity:   . Worried About Charity fundraiser in the Last Year:   . Arboriculturist in the Last Year:   Transportation Needs:   . Film/video editor (Medical):   Marland Kitchen Lack of Transportation (Non-Medical):   Physical Activity:   . Days of  Exercise per Week:   . Minutes of Exercise per Session:   Stress:   . Feeling of Stress :   Social Connections:   . Frequency of Communication with Friends and Family:   . Frequency of Social Gatherings with Friends and Family:   . Attends Religious Services:   . Active Member of Clubs or Organizations:   . Attends Archivist Meetings:   Marland Kitchen Marital Status:   Intimate Partner Violence:   . Fear of Current or Ex-Partner:   . Emotionally Abused:   Marland Kitchen Physically Abused:   . Sexually Abused:     REVIEW OF SYSTEMS: Constitutional:  No fevers, chills, or sweats, no generalized fatigue, change in appetite Eyes: No visual changes, double vision, eye pain Ear, nose and throat: No hearing loss, ear pain, nasal congestion, sore throat Cardiovascular: No chest pain, palpitations Respiratory:  No shortness of breath at rest or with exertion, wheezes GastrointestinaI: No nausea, vomiting, diarrhea, abdominal pain, fecal incontinence Genitourinary:  No dysuria, urinary retention or frequency Musculoskeletal:  No neck pain, back pain Integumentary: No rash, pruritus, skin lesions Neurological: as above Psychiatric: No depression, insomnia, anxiety Endocrine: No palpitations, fatigue, diaphoresis, mood swings, change in appetite, change in weight, increased thirst Hematologic/Lymphatic:  No purpura, petechiae. Allergic/Immunologic: no itchy/runny eyes, nasal congestion, recent allergic reactions, rashes  PHYSICAL EXAM: *** General: No acute distress.  Patient appears ***-groomed.  *** Head:  Normocephalic/atraumatic Eyes:  fundi examined but not visualized Neck: supple, no paraspinal tenderness, full range of motion Back: No paraspinal tenderness Heart: regular rate and rhythm Lungs: Clear to auscultation bilaterally. Vascular: No carotid bruits. Neurological Exam: Mental status: alert and oriented to person, place, and time, recent and remote memory intact, fund of knowledge  intact, attention and concentration intact, speech fluent and not dysarthric, language intact. Cranial nerves: CN I: not tested CN II: pupils equal, round and reactive to light, visual fields intact CN III, IV, VI:  full range of motion, no nystagmus, no ptosis CN V: facial sensation intact CN VII: upper and lower face symmetric CN VIII: hearing intact CN IX, X: gag intact, uvula midline CN XI: sternocleidomastoid and trapezius muscles intact CN XII: tongue midline Bulk & Tone: normal, no fasciculations. Motor:  5/5 throughout *** Sensation:  Pinprick *** temperature *** and vibration sensation intact.  ***. Deep Tendon Reflexes:  2+ throughout, *** toes downgoing.  *** Finger to nose testing:  Without dysmetria.  *** Heel to shin:  Without dysmetria.  *** Gait:  Normal station and stride.  Able to turn and tandem walk. [place]berg ***.  IMPRESSION: ***  PLAN: ***  Thank you for allowing me to take part in the care of this patient.  Metta Clines, DO  CC: ***

## 2019-11-24 ENCOUNTER — Ambulatory Visit: Payer: BC Managed Care – PPO | Admitting: Neurology

## 2019-11-26 ENCOUNTER — Encounter: Payer: Self-pay | Admitting: Neurology

## 2019-12-01 ENCOUNTER — Encounter (HOSPITAL_COMMUNITY): Payer: Self-pay

## 2019-12-01 ENCOUNTER — Other Ambulatory Visit: Payer: Self-pay

## 2019-12-01 DIAGNOSIS — K219 Gastro-esophageal reflux disease without esophagitis: Secondary | ICD-10-CM | POA: Insufficient documentation

## 2019-12-01 DIAGNOSIS — E1165 Type 2 diabetes mellitus with hyperglycemia: Secondary | ICD-10-CM | POA: Diagnosis not present

## 2019-12-01 DIAGNOSIS — R112 Nausea with vomiting, unspecified: Secondary | ICD-10-CM | POA: Diagnosis not present

## 2019-12-01 DIAGNOSIS — R1010 Upper abdominal pain, unspecified: Secondary | ICD-10-CM | POA: Insufficient documentation

## 2019-12-01 DIAGNOSIS — Z794 Long term (current) use of insulin: Secondary | ICD-10-CM | POA: Diagnosis not present

## 2019-12-01 LAB — COMPREHENSIVE METABOLIC PANEL
ALT: 25 U/L (ref 0–44)
AST: 21 U/L (ref 15–41)
Albumin: 3.9 g/dL (ref 3.5–5.0)
Alkaline Phosphatase: 118 U/L (ref 38–126)
Anion gap: 11 (ref 5–15)
BUN: 12 mg/dL (ref 6–20)
CO2: 24 mmol/L (ref 22–32)
Calcium: 9.1 mg/dL (ref 8.9–10.3)
Chloride: 103 mmol/L (ref 98–111)
Creatinine, Ser: 0.68 mg/dL (ref 0.44–1.00)
GFR calc Af Amer: >60 mL/min (ref >60)
GFR calc non Af Amer: >60 mL/min (ref >60)
Glucose, Bld: 323 mg/dL — ABNORMAL HIGH (ref 70–99)
Potassium: 3.9 mmol/L (ref 3.5–5.1)
Sodium: 138 mmol/L (ref 135–145)
Total Bilirubin: 1.3 mg/dL — ABNORMAL HIGH (ref 0.3–1.2)
Total Protein: 8.4 g/dL — ABNORMAL HIGH (ref 6.5–8.1)

## 2019-12-01 LAB — CBC
HCT: 46 % (ref 36.0–46.0)
Hemoglobin: 15.6 g/dL — ABNORMAL HIGH (ref 12.0–15.0)
MCH: 29 pg (ref 26.0–34.0)
MCHC: 33.9 g/dL (ref 30.0–36.0)
MCV: 85.5 fL (ref 80.0–100.0)
Platelets: 391 10*3/uL (ref 150–400)
RBC: 5.38 MIL/uL — ABNORMAL HIGH (ref 3.87–5.11)
RDW: 12.4 % (ref 11.5–15.5)
WBC: 9.2 10*3/uL (ref 4.0–10.5)
nRBC: 0 % (ref 0.0–0.2)

## 2019-12-01 LAB — CBG MONITORING, ED: Glucose-Capillary: 293 mg/dL — ABNORMAL HIGH (ref 70–99)

## 2019-12-01 LAB — LIPASE, BLOOD: Lipase: 28 U/L (ref 11–51)

## 2019-12-01 MED ORDER — ONDANSETRON 4 MG PO TBDP: 4.0000 mg | ORAL_TABLET | Freq: Once | ORAL | Status: DC | PRN

## 2019-12-01 MED ORDER — SODIUM CHLORIDE 0.9% FLUSH: 3.0000 mL | Freq: Once | INTRAVENOUS | Status: AC

## 2019-12-01 NOTE — ED Triage Notes (Signed)
Arrived POV from home. Patient reports vomiting since 8 AM this morning. Patient states she is type II insulin resistant diabetic. Patient also reports insect bite on posterior right forearm

## 2019-12-02 ENCOUNTER — Emergency Department (HOSPITAL_COMMUNITY)
Admission: EM | Admit: 2019-12-02 | Discharge: 2019-12-02 | Disposition: A | Discharge status: Home / Self Care | Payer: BC Managed Care – PPO | Attending: Emergency Medicine | Admitting: Emergency Medicine

## 2019-12-02 ENCOUNTER — Emergency Department (HOSPITAL_COMMUNITY): Payer: BC Managed Care – PPO

## 2019-12-02 DIAGNOSIS — R112 Nausea with vomiting, unspecified: Secondary | ICD-10-CM

## 2019-12-02 LAB — URINALYSIS, ROUTINE W REFLEX MICROSCOPIC
Bilirubin Urine: NEGATIVE
Glucose, UA: >=500 mg/dL — AB
Hgb urine dipstick: NEGATIVE
Ketones, ur: 5 mg/dL — AB
Leukocytes,Ua: NEGATIVE
Nitrite: NEGATIVE
Protein, ur: NEGATIVE mg/dL
Specific Gravity, Urine: 1.017 (ref 1.005–1.030)
pH: 5 (ref 5.0–8.0)

## 2019-12-02 MED ORDER — IOHEXOL 300 MG/ML  SOLN
100.0000 mL | Freq: Once | INTRAMUSCULAR | Status: AC | PRN
  Administered 2019-12-02: 100 mL via INTRAVENOUS

## 2019-12-02 MED ORDER — OXYCODONE HCL 5 MG PO TABS
10.0000 mg | ORAL_TABLET | Freq: Once | ORAL | Status: AC
  Administered 2019-12-02: 10 mg via ORAL
  Filled 2019-12-02: qty 2

## 2019-12-02 MED ORDER — ONDANSETRON HCL 4 MG/2ML IJ SOLN
4.0000 mg | Freq: Once | INTRAMUSCULAR | Status: AC
  Administered 2019-12-02: 4 mg via INTRAVENOUS
  Filled 2019-12-02: qty 2

## 2019-12-02 MED ORDER — TIZANIDINE HCL 4 MG PO TABS
4.0000 mg | ORAL_TABLET | Freq: Once | ORAL | Status: AC
  Administered 2019-12-02: 4 mg via ORAL
  Filled 2019-12-02: qty 1

## 2019-12-02 MED ORDER — METOCLOPRAMIDE HCL 5 MG PO TABS: 5.0000 mg | ORAL_TABLET | Freq: Four times a day (QID) | ORAL | 1 refills | Status: DC | PRN

## 2019-12-02 MED ORDER — METOCLOPRAMIDE HCL 5 MG PO TABS: 5.0000 mg | ORAL_TABLET | Freq: Four times a day (QID) | ORAL | 0 refills | Status: DC | PRN

## 2019-12-02 MED ORDER — SODIUM CHLORIDE (PF) 0.9 % IJ SOLN
INTRAMUSCULAR | Status: AC
  Administered 2019-12-02: 3 mL via INTRAVENOUS
  Filled 2019-12-02: qty 50

## 2019-12-02 MED ORDER — GABAPENTIN 300 MG PO CAPS
600.0000 mg | ORAL_CAPSULE | Freq: Once | ORAL | Status: DC
  Filled 2019-12-02: qty 2

## 2019-12-02 MED ORDER — SODIUM CHLORIDE 0.9 % IV BOLUS
1000.0000 mL | Freq: Once | INTRAVENOUS | Status: AC
  Administered 2019-12-02: 1000 mL via INTRAVENOUS

## 2019-12-02 NOTE — Progress Notes (Signed)
TOC CM referral received to assist with transportation. Pt husband will provide transportation home. [place], [place] ED TOC CM [phone]

## 2019-12-02 NOTE — Discharge Instructions (Addendum)
Please read instructions below. Drink clear liquids until your stomach feels better. Then, slowly introduce bland foods into your diet as tolerated, such as bread, rice, apples, bananas. You can take reglan every 6 hours as needed for nausea. Follow up with your primary care if symptoms persist. Return to the ER for severe abdominal pain, fever, uncontrollable vomiting, or new or concerning symptoms.

## 2019-12-02 NOTE — ED Provider Notes (Signed)
[facility] DEPT Provider Note   CSN: [id number] Arrival date & time: 12/01/19  2104     History Chief Complaint  Patient presents with  . Emesis    Karen Stein is a 49 y.o. female w PMHx T2DM, endometriosis, fibromyalgia, GERD, PCOS, CVA, presenting to the ED with complaint of nausea and vomiting that began yesterday morning at 8am. NBNB emesis then dry heaving. She has had PO liquids last night and kept it down. No associated abdominal pain though has some upper abdominal discomfort with the heaving. Denies associated fever, URI sx, urinary sx, diarrhea, constipation. No interventions tired prior to arrival.     The history is provided by the patient.       Past Medical History:  Diagnosis Date  . Anemia   . Anxiety   . Arthritis   . Chronic pain   . Complication of anesthesia    anesthesia awareness,  . DDD (degenerative disc disease)   . DDD (degenerative disc disease)   . Degenerative disc disease   . Degenerative disc disease   . Diabetes mellitus    Type 2 insulin resistant  . Dysrhythmia    stress and caffeine related  . Endometriosis   . Fibroids   . Fibromyalgia   . GERD (gastroesophageal reflux disease)   . High cholesterol   . Insomnia   . Leg cramps   . PCOS (polycystic ovarian syndrome)   . Scoliosis   . Sickle cell trait [facility])     Patient Active Problem List   Diagnosis Date Noted  . DKA (diabetic ketoacidoses) ([place]) 01/26/2019  . Esophagitis   . Hyperbilirubinemia   . AKI (acute kidney injury) ([place])   . OSA on CPAP   . Lacunar infarction ([place])   . HLD (hyperlipidemia) 07/31/2018  . Prolonged QT interval 07/31/2018  . Chest pain 07/31/2018  . Nausea vomiting and diarrhea 07/31/2018  . Abdominal pain 07/31/2018  . Elevated blood protein 07/31/2018  . Diabetes mellitus without complication ([place]) 70/62/3762  . Anxiety   . Depression 08/25/2017  . Major depressive disorder, recurrent severe without  psychotic features ([place]) 10/12/2015  . Degenerative disc disease   . Sickle cell trait ([place])   . DDD (degenerative disc disease), lumbar     Past Surgical History:  Procedure Laterality Date  . BACK SURGERY  2010  . BIOPSY BREAST    . CESAREAN SECTION    . DILATATION & CURETTAGE/HYSTEROSCOPY WITH MYOSURE N/A 01/01/2015   Procedure: DILATATION & CURETTAGE/HYSTEROSCOPY WITH MYOSURE;  Surgeon: Crawford Givens, MD;  Location: [facility];  Service: Gynecology;  Laterality: N/A;  . LAPAROSCOPY  2002 or 2003  . TOE SURGERY  2008  . WISDOM TOOTH EXTRACTION       OB History    Gravida  4   Para  2   Term  1   Preterm  1   AB  2   Living  1     SAB  2   TAB      Ectopic      Multiple      Live Births              Family History  Problem Relation Age of Onset  . Diabetes Father   . Hypertension Father   . Hyperlipidemia Father   . Kidney failure Father   . Diabetes Paternal Grandfather   . Hypertension Paternal Grandfather   . Hyperlipidemia Paternal Grandfather   . Diabetes Paternal  Grandmother   . Hypertension Paternal Grandmother   . Hyperlipidemia Paternal Grandmother   . Alzheimer's disease Maternal Grandmother   . Brain cancer Maternal Grandfather     Social History   Tobacco Use  . Smoking status: Never Smoker  . Smokeless tobacco: Never Used  Vaping Use  . Vaping Use: Never used  Substance Use Topics  . Alcohol use: No    Alcohol/week: 0.0 standard drinks  . Drug use: Yes    Types: Marijuana    Home Medications Prior to Admission medications   Medication Sig Start Date End Date Taking? Authorizing Provider  diclofenac (VOLTAREN) 75 MG EC tablet Take 75 mg by mouth 2 (two) times daily. 10/09/19  Yes [provider]  Insulin Aspart, w/Niacinamide, (FIASP) 100 UNIT/ML SOLN Inject 5-16 Units into the skin with breakfast, with lunch, and with evening meal. Per sliding scale 09/11/19  Yes [provider]  insulin glargine (LANTUS) 100  UNIT/ML injection Inject 0.15 mLs (15 Units total) into the skin at bedtime. 01/29/19  Yes Mariel Aloe, MD  Oxycodone HCl 10 MG TABS Take 10 mg by mouth every 6 (six) hours. 11/07/19  Yes [provider]  Semaglutide,0.25 or 0.5MG /DOS, (OZEMPIC, 0.25 OR 0.5 MG/DOSE,) 2 MG/1.5ML SOPN Inject 0.4 mLs into the skin once a week. 09/11/19  Yes [provider]  tiZANidine (ZANAFLEX) 4 MG tablet Take 4 mg by mouth 3 (three) times daily. 01/11/19  Yes [provider]  atorvastatin (LIPITOR) 20 MG tablet Take 1 tablet (20 mg total) by mouth daily at 6 PM. Patient not taking: Reported on 12/02/2019 08/01/18   Radene Gunning, NP  ciprofloxacin (CIPRO) 250 MG tablet Take 250 mg by mouth 2 (two) times daily. 7 day supply 11/21/19   [provider]  clindamycin (CLEOCIN) 300 MG capsule Take 1 capsule (300 mg total) by mouth 4 (four) times daily. X 7 days Patient not taking: Reported on 12/02/2019 08/23/19   Palumbo, April, MD  lidocaine (LIDODERM) 5 % Place 1 patch onto the skin daily. Remove & Discard patch within 12 hours or as directed by MD Patient not taking: Reported on 12/02/2019 08/23/19   Palumbo, April, MD  metoCLOPramide (REGLAN) 5 MG tablet Take 1 tablet (5 mg total) by mouth every 6 (six) hours as needed for nausea. Patient not taking: Reported on 08/08/2018 08/01/18   Radene Gunning, NP  metoprolol tartrate (LOPRESSOR) 25 MG tablet Take 1 tablet (25 mg total) by mouth 2 (two) times daily. Patient not taking: Reported on 01/17/2019 08/01/18   Radene Gunning, NP  pantoprazole (PROTONIX) 40 MG tablet Take 1 tablet (40 mg total) by mouth daily. Patient not taking: Reported on 12/02/2019 01/30/19   Mariel Aloe, MD    Allergies    Amoxicillin, Macrobid [nitrofurantoin macrocrystal], Shellfish allergy, and Sulfa antibiotics  Review of Systems   Review of Systems  All other systems reviewed and are negative.   Physical Exam Updated Vital Signs BP 110/72 (BP Location: Right Arm)    Pulse 84   Temp 98 F (36.7 C) (Oral)   Resp 14   Ht 5\' 8"  (1.727 m)   Wt 99.8 kg   LMP 11/13/2015 (Exact Date)   SpO2 100%   BMI 33.45 kg/m   Physical Exam Vitals and nursing note reviewed.  Constitutional:      Appearance: She is well-developed. She is not ill-appearing.  HENT:     Head: Normocephalic and atraumatic.  Eyes:  Conjunctiva/sclera: Conjunctivae normal.  Cardiovascular:     Rate and Rhythm: Normal rate and regular rhythm.  Pulmonary:     Effort: Pulmonary effort is normal. No respiratory distress.     Breath sounds: Normal breath sounds.  Abdominal:     General: Bowel sounds are normal.     Palpations: Abdomen is soft.     Tenderness: There is abdominal tenderness (generalized mild TTP). There is no guarding or rebound.  Skin:    General: Skin is warm.  Neurological:     Mental Status: She is alert.  Psychiatric:        Behavior: Behavior normal.     ED Results / Procedures / Treatments   Labs (all labs ordered are listed, but only abnormal results are displayed) Labs Reviewed  COMPREHENSIVE METABOLIC PANEL - Abnormal; Notable for the following components:      Result Value   Glucose, Bld 323 (*)    Total Protein 8.4 (*)    Total Bilirubin 1.3 (*)    All other components within normal limits  CBC - Abnormal; Notable for the following components:   RBC 5.38 (*)    Hemoglobin 15.6 (*)    All other components within normal limits  URINALYSIS, ROUTINE W REFLEX MICROSCOPIC - Abnormal; Notable for the following components:   Glucose, UA >=500 (*)    Ketones, ur 5 (*)    Bacteria, UA RARE (*)    All other components within normal limits  CBG MONITORING, ED - Abnormal; Notable for the following components:   Glucose-Capillary 293 (*)    All other components within normal limits  LIPASE, BLOOD    EKG EKG Interpretation  Date/Time:  Tuesday December 02 2019 07:52:56 EDT Ventricular Rate:  76 PR Interval:    QRS Duration: 86 QT  Interval:  381 QTC Calculation: 429 R Axis:   87 Text Interpretation: Sinus rhythm Borderline repolarization abnormality Since last tracing Nonspecific T wave abnormality NOW PRESENT Confirmed by Calvert Cantor [phone]) on 12/02/2019 8:05:57 AM   Radiology CT Abdomen Pelvis W Contrast  Result Date: 12/02/2019 CLINICAL DATA:  Nausea and vomiting EXAM: CT ABDOMEN AND PELVIS WITH CONTRAST TECHNIQUE: Multidetector CT imaging of the abdomen and pelvis was performed using the standard protocol following bolus administration of intravenous contrast. CONTRAST:  140mL OMNIPAQUE IOHEXOL 300 MG/ML  SOLN COMPARISON:  01/26/2019 FINDINGS: Lower chest: No acute abnormality. Hepatobiliary: No focal liver abnormality is seen. No gallstones, gallbladder wall thickening, or biliary dilatation. Pancreas: Unremarkable. No pancreatic ductal dilatation or surrounding inflammatory changes. Spleen: Normal in size without focal abnormality. Adrenals/Urinary Tract: Adrenal glands are within normal limits. Kidneys demonstrate a normal enhancement pattern bilaterally. No renal calculi or obstructive changes are seen. Normal excretion is noted on delayed images. The bladder is within normal limits. Stomach/Bowel: The appendix is well visualized and within normal limits. Colon shows no obstructive or inflammatory changes. Small bowel and stomach appear within normal limits. Vascular/Lymphatic: Aortic atherosclerosis. No enlarged abdominal or pelvic lymph nodes. Reproductive: Uterus is well visualized. Mild increased enhancement is noted in the anterior aspect of the uterus likely representing a small uterine fibroid. No other focal abnormality is noted. Other: No abdominal wall hernia or abnormality. No abdominopelvic ascites. Musculoskeletal: Postsurgical changes in the lower lumbar spine are noted. No acute bony abnormality is seen. IMPRESSION: Small anterior uterine fibroid. No other focal abnormality is noted. Electronically Signed    By: Inez Catalina M.D.   On: 12/02/2019 10:28    Procedures Procedures (including  critical care time)  Medications Ordered in ED Medications  ondansetron (ZOFRAN-ODT) disintegrating tablet 4 mg (has no administration in time range)  gabapentin (NEURONTIN) capsule 600 mg (600 mg Oral Refused 12/02/19 1007)  sodium chloride flush (NS) 0.9 % injection 3 mL (3 mLs Intravenous Given 12/02/19 0829)  sodium chloride 0.9 % bolus 1,000 mL (1,000 mLs Intravenous New Bag/Given 12/02/19 0904)  ondansetron (ZOFRAN) injection 4 mg (4 mg Intravenous Given 12/02/19 0904)  iohexol (OMNIPAQUE) 300 MG/ML solution 100 mL (100 mLs Intravenous Contrast Given 12/02/19 0935)  oxyCODONE (Oxy IR/ROXICODONE) immediate release tablet 10 mg (10 mg Oral Given 12/02/19 1007)  tiZANidine (ZANAFLEX) tablet 4 mg (4 mg Oral Given 12/02/19 1008)    ED Course  I have reviewed the triage vital signs and the nursing notes.  Pertinent labs & imaging results that were available during my care of the patient were reviewed by me and considered in my medical decision making (see chart for details).  Clinical Course as of Dec 02 1055  Tue Dec 02, 2019  1003 Pt requesting home medications   [JR]    Clinical Course User Index [JR] Keirah Konitzer, [place] N, PA-C   MDM Rules/Calculators/A&P                          Pt presenting with N/V that began yesterday. Pt is not ill-appearing. VSS. Abd soft with some mild generalized TTP. Labs are reassuring, hyperglycemia though normal CO2 and normal AG. No leukocytosis. U/A negative. Lipase wnl. CT is negative. No evidence of emergent or surgical pathology as cause of patients symptoms today. Nausea treated in the ED, tolerating PO. May be viral in nature such as gastroenteritis. Recommend symptomatic management and PCP follow up. Pt is appropriate for discharge.  Discussed results, findings, treatment and follow up. Patient advised of return precautions.   Final Clinical Impression(s) / ED  Diagnoses Final diagnoses:  Non-intractable vomiting with nausea, unspecified vomiting type    Rx / DC Orders ED Discharge Orders    None       Daena Alper, [place] N, PA-C 12/02/19 1057    Truddie Hidden, MD 12/02/19 1434

## 2019-12-02 NOTE — ED Notes (Signed)
This writer attempted to start IV x 2 w/o success. This Probation officer has asked other certified staff member to attempt IV start.

## 2020-03-01 DIAGNOSIS — M797 Fibromyalgia: Secondary | ICD-10-CM | POA: Diagnosis not present

## 2020-03-03 DIAGNOSIS — M797 Fibromyalgia: Secondary | ICD-10-CM | POA: Diagnosis not present

## 2020-03-05 DIAGNOSIS — M797 Fibromyalgia: Secondary | ICD-10-CM | POA: Diagnosis not present

## 2020-03-08 DIAGNOSIS — M797 Fibromyalgia: Secondary | ICD-10-CM | POA: Diagnosis not present

## 2020-03-10 DIAGNOSIS — M797 Fibromyalgia: Secondary | ICD-10-CM | POA: Diagnosis not present

## 2020-03-16 DIAGNOSIS — M797 Fibromyalgia: Secondary | ICD-10-CM | POA: Diagnosis not present

## 2020-03-16 DIAGNOSIS — E785 Hyperlipidemia, unspecified: Secondary | ICD-10-CM | POA: Diagnosis not present

## 2020-03-16 DIAGNOSIS — N390 Urinary tract infection, site not specified: Secondary | ICD-10-CM | POA: Diagnosis not present

## 2020-03-16 DIAGNOSIS — E119 Type 2 diabetes mellitus without complications: Secondary | ICD-10-CM | POA: Diagnosis not present

## 2020-03-17 DIAGNOSIS — M797 Fibromyalgia: Secondary | ICD-10-CM | POA: Diagnosis not present

## 2020-03-22 DIAGNOSIS — M797 Fibromyalgia: Secondary | ICD-10-CM | POA: Diagnosis not present

## 2020-03-31 DIAGNOSIS — M797 Fibromyalgia: Secondary | ICD-10-CM | POA: Diagnosis not present

## 2020-04-05 DIAGNOSIS — M797 Fibromyalgia: Secondary | ICD-10-CM | POA: Diagnosis not present

## 2020-04-06 DIAGNOSIS — Z794 Long term (current) use of insulin: Secondary | ICD-10-CM | POA: Diagnosis not present

## 2020-04-06 DIAGNOSIS — E1165 Type 2 diabetes mellitus with hyperglycemia: Secondary | ICD-10-CM | POA: Diagnosis not present

## 2020-04-09 DIAGNOSIS — M797 Fibromyalgia: Secondary | ICD-10-CM | POA: Diagnosis not present

## 2020-04-12 DIAGNOSIS — M797 Fibromyalgia: Secondary | ICD-10-CM | POA: Diagnosis not present

## 2020-04-14 DIAGNOSIS — M797 Fibromyalgia: Secondary | ICD-10-CM | POA: Diagnosis not present

## 2020-04-15 DIAGNOSIS — R52 Pain, unspecified: Secondary | ICD-10-CM | POA: Diagnosis not present

## 2020-04-15 DIAGNOSIS — M797 Fibromyalgia: Secondary | ICD-10-CM | POA: Diagnosis not present

## 2020-04-15 DIAGNOSIS — E119 Type 2 diabetes mellitus without complications: Secondary | ICD-10-CM | POA: Diagnosis not present

## 2020-04-15 DIAGNOSIS — N390 Urinary tract infection, site not specified: Secondary | ICD-10-CM | POA: Diagnosis not present

## 2020-04-15 DIAGNOSIS — E785 Hyperlipidemia, unspecified: Secondary | ICD-10-CM | POA: Diagnosis not present

## 2020-04-19 DIAGNOSIS — M797 Fibromyalgia: Secondary | ICD-10-CM | POA: Diagnosis not present

## 2020-04-22 DIAGNOSIS — M797 Fibromyalgia: Secondary | ICD-10-CM | POA: Diagnosis not present

## 2020-04-29 DIAGNOSIS — M797 Fibromyalgia: Secondary | ICD-10-CM | POA: Diagnosis not present

## 2020-05-06 DIAGNOSIS — Z87440 Personal history of urinary (tract) infections: Secondary | ICD-10-CM | POA: Diagnosis not present

## 2020-05-06 DIAGNOSIS — N952 Postmenopausal atrophic vaginitis: Secondary | ICD-10-CM | POA: Diagnosis not present

## 2020-05-19 DIAGNOSIS — R252 Cramp and spasm: Secondary | ICD-10-CM | POA: Diagnosis not present

## 2020-05-19 DIAGNOSIS — G5603 Carpal tunnel syndrome, bilateral upper limbs: Secondary | ICD-10-CM | POA: Diagnosis not present

## 2020-05-19 DIAGNOSIS — Z79899 Other long term (current) drug therapy: Secondary | ICD-10-CM | POA: Diagnosis not present

## 2020-05-19 DIAGNOSIS — M5417 Radiculopathy, lumbosacral region: Secondary | ICD-10-CM | POA: Diagnosis not present

## 2020-05-20 DIAGNOSIS — E1140 Type 2 diabetes mellitus with diabetic neuropathy, unspecified: Secondary | ICD-10-CM | POA: Diagnosis not present

## 2020-05-20 DIAGNOSIS — G8929 Other chronic pain: Secondary | ICD-10-CM | POA: Diagnosis not present

## 2020-05-20 DIAGNOSIS — M5441 Lumbago with sciatica, right side: Secondary | ICD-10-CM | POA: Diagnosis not present

## 2020-05-20 DIAGNOSIS — M5442 Lumbago with sciatica, left side: Secondary | ICD-10-CM | POA: Diagnosis not present

## 2020-05-24 DIAGNOSIS — N390 Urinary tract infection, site not specified: Secondary | ICD-10-CM | POA: Diagnosis not present

## 2020-05-24 DIAGNOSIS — M797 Fibromyalgia: Secondary | ICD-10-CM | POA: Diagnosis not present

## 2020-05-24 DIAGNOSIS — E119 Type 2 diabetes mellitus without complications: Secondary | ICD-10-CM | POA: Diagnosis not present

## 2020-05-24 DIAGNOSIS — E785 Hyperlipidemia, unspecified: Secondary | ICD-10-CM | POA: Diagnosis not present

## 2020-05-25 DIAGNOSIS — B009 Herpesviral infection, unspecified: Secondary | ICD-10-CM | POA: Diagnosis not present

## 2020-05-25 DIAGNOSIS — B373 Candidiasis of vulva and vagina: Secondary | ICD-10-CM | POA: Diagnosis not present

## 2020-05-25 DIAGNOSIS — N898 Other specified noninflammatory disorders of vagina: Secondary | ICD-10-CM | POA: Diagnosis not present

## 2020-06-16 DIAGNOSIS — E1165 Type 2 diabetes mellitus with hyperglycemia: Secondary | ICD-10-CM | POA: Diagnosis not present

## 2020-06-16 DIAGNOSIS — Z794 Long term (current) use of insulin: Secondary | ICD-10-CM | POA: Diagnosis not present

## 2020-07-05 DIAGNOSIS — Z1231 Encounter for screening mammogram for malignant neoplasm of breast: Secondary | ICD-10-CM | POA: Diagnosis not present

## 2020-07-05 DIAGNOSIS — N898 Other specified noninflammatory disorders of vagina: Secondary | ICD-10-CM | POA: Diagnosis not present

## 2020-07-05 DIAGNOSIS — F319 Bipolar disorder, unspecified: Secondary | ICD-10-CM | POA: Diagnosis not present

## 2020-07-05 DIAGNOSIS — Z01419 Encounter for gynecological examination (general) (routine) without abnormal findings: Secondary | ICD-10-CM | POA: Diagnosis not present

## 2020-07-05 DIAGNOSIS — Z6835 Body mass index (BMI) 35.0-35.9, adult: Secondary | ICD-10-CM | POA: Diagnosis not present

## 2020-07-06 ENCOUNTER — Other Ambulatory Visit: Payer: Self-pay | Admitting: Obstetrics and Gynecology

## 2020-07-06 DIAGNOSIS — Z9189 Other specified personal risk factors, not elsewhere classified: Secondary | ICD-10-CM

## 2020-07-14 DIAGNOSIS — M797 Fibromyalgia: Secondary | ICD-10-CM | POA: Diagnosis not present

## 2020-07-20 DIAGNOSIS — Z794 Long term (current) use of insulin: Secondary | ICD-10-CM | POA: Diagnosis not present

## 2020-07-20 DIAGNOSIS — E1165 Type 2 diabetes mellitus with hyperglycemia: Secondary | ICD-10-CM | POA: Diagnosis not present

## 2020-07-30 ENCOUNTER — Ambulatory Visit
Admission: RE | Admit: 2020-07-30 | Discharge: 2020-07-30 | Disposition: A | Discharge status: Home / Self Care | Payer: BC Managed Care – PPO | Source: Ambulatory Visit | Attending: Obstetrics and Gynecology | Admitting: Obstetrics and Gynecology

## 2020-07-30 DIAGNOSIS — N6489 Other specified disorders of breast: Secondary | ICD-10-CM | POA: Diagnosis not present

## 2020-07-30 DIAGNOSIS — Z9189 Other specified personal risk factors, not elsewhere classified: Secondary | ICD-10-CM

## 2020-07-30 MED ORDER — GADOBUTROL 1 MMOL/ML IV SOLN
9.0000 mL | Freq: Once | INTRAVENOUS | Status: AC | PRN
  Administered 2020-07-30: 9 mL via INTRAVENOUS

## 2020-08-06 DIAGNOSIS — M546 Pain in thoracic spine: Secondary | ICD-10-CM | POA: Diagnosis not present

## 2020-08-06 DIAGNOSIS — R2681 Unsteadiness on feet: Secondary | ICD-10-CM | POA: Diagnosis not present

## 2020-08-06 DIAGNOSIS — M792 Neuralgia and neuritis, unspecified: Secondary | ICD-10-CM | POA: Diagnosis not present

## 2020-08-06 DIAGNOSIS — Z01818 Encounter for other preprocedural examination: Secondary | ICD-10-CM | POA: Diagnosis not present

## 2020-08-06 DIAGNOSIS — G4733 Obstructive sleep apnea (adult) (pediatric): Secondary | ICD-10-CM | POA: Diagnosis not present

## 2020-08-06 DIAGNOSIS — M5030 Other cervical disc degeneration, unspecified cervical region: Secondary | ICD-10-CM | POA: Diagnosis not present

## 2020-08-06 DIAGNOSIS — M542 Cervicalgia: Secondary | ICD-10-CM | POA: Diagnosis not present

## 2020-08-06 DIAGNOSIS — M47812 Spondylosis without myelopathy or radiculopathy, cervical region: Secondary | ICD-10-CM | POA: Diagnosis not present

## 2020-08-10 DIAGNOSIS — M961 Postlaminectomy syndrome, not elsewhere classified: Secondary | ICD-10-CM | POA: Diagnosis not present

## 2020-08-10 DIAGNOSIS — M5136 Other intervertebral disc degeneration, lumbar region: Secondary | ICD-10-CM | POA: Diagnosis not present

## 2020-08-10 DIAGNOSIS — M4180 Other forms of scoliosis, site unspecified: Secondary | ICD-10-CM | POA: Diagnosis not present

## 2020-08-10 DIAGNOSIS — E669 Obesity, unspecified: Secondary | ICD-10-CM | POA: Diagnosis not present

## 2020-08-10 DIAGNOSIS — I6381 Other cerebral infarction due to occlusion or stenosis of small artery: Secondary | ICD-10-CM | POA: Diagnosis not present

## 2020-08-10 DIAGNOSIS — E1140 Type 2 diabetes mellitus with diabetic neuropathy, unspecified: Secondary | ICD-10-CM | POA: Diagnosis not present

## 2020-08-10 DIAGNOSIS — M5440 Lumbago with sciatica, unspecified side: Secondary | ICD-10-CM | POA: Diagnosis not present

## 2020-08-10 DIAGNOSIS — E1165 Type 2 diabetes mellitus with hyperglycemia: Secondary | ICD-10-CM | POA: Diagnosis not present

## 2020-08-10 DIAGNOSIS — Z006 Encounter for examination for normal comparison and control in clinical research program: Secondary | ICD-10-CM | POA: Diagnosis not present

## 2020-08-10 DIAGNOSIS — E1122 Type 2 diabetes mellitus with diabetic chronic kidney disease: Secondary | ICD-10-CM | POA: Diagnosis not present

## 2020-08-10 DIAGNOSIS — G4733 Obstructive sleep apnea (adult) (pediatric): Secondary | ICD-10-CM | POA: Diagnosis not present

## 2020-08-10 DIAGNOSIS — R2681 Unsteadiness on feet: Secondary | ICD-10-CM | POA: Diagnosis not present

## 2020-08-10 DIAGNOSIS — D573 Sickle-cell trait: Secondary | ICD-10-CM | POA: Diagnosis not present

## 2020-08-10 DIAGNOSIS — G894 Chronic pain syndrome: Secondary | ICD-10-CM | POA: Diagnosis not present

## 2020-08-10 DIAGNOSIS — E1143 Type 2 diabetes mellitus with diabetic autonomic (poly)neuropathy: Secondary | ICD-10-CM | POA: Diagnosis not present

## 2020-08-10 DIAGNOSIS — E785 Hyperlipidemia, unspecified: Secondary | ICD-10-CM | POA: Diagnosis not present

## 2020-08-10 DIAGNOSIS — N189 Chronic kidney disease, unspecified: Secondary | ICD-10-CM | POA: Diagnosis not present

## 2020-08-10 DIAGNOSIS — E559 Vitamin D deficiency, unspecified: Secondary | ICD-10-CM | POA: Diagnosis not present

## 2020-08-11 ENCOUNTER — Other Ambulatory Visit: Payer: Self-pay | Admitting: Adult Health Nurse Practitioner

## 2020-08-11 DIAGNOSIS — M5030 Other cervical disc degeneration, unspecified cervical region: Secondary | ICD-10-CM

## 2020-08-11 DIAGNOSIS — M542 Cervicalgia: Secondary | ICD-10-CM

## 2020-08-11 DIAGNOSIS — M792 Neuralgia and neuritis, unspecified: Secondary | ICD-10-CM

## 2020-08-16 DIAGNOSIS — M5441 Lumbago with sciatica, right side: Secondary | ICD-10-CM | POA: Diagnosis not present

## 2020-08-16 DIAGNOSIS — M5442 Lumbago with sciatica, left side: Secondary | ICD-10-CM | POA: Diagnosis not present

## 2020-08-16 DIAGNOSIS — Z72820 Sleep deprivation: Secondary | ICD-10-CM | POA: Diagnosis not present

## 2020-08-16 DIAGNOSIS — D573 Sickle-cell trait: Secondary | ICD-10-CM | POA: Diagnosis not present

## 2020-08-16 DIAGNOSIS — R112 Nausea with vomiting, unspecified: Secondary | ICD-10-CM | POA: Diagnosis not present

## 2020-08-16 DIAGNOSIS — E282 Polycystic ovarian syndrome: Secondary | ICD-10-CM | POA: Diagnosis not present

## 2020-08-16 DIAGNOSIS — M1990 Unspecified osteoarthritis, unspecified site: Secondary | ICD-10-CM | POA: Diagnosis not present

## 2020-08-16 DIAGNOSIS — G8929 Other chronic pain: Secondary | ICD-10-CM | POA: Diagnosis not present

## 2020-08-16 DIAGNOSIS — Z79891 Long term (current) use of opiate analgesic: Secondary | ICD-10-CM | POA: Diagnosis not present

## 2020-08-16 DIAGNOSIS — E1140 Type 2 diabetes mellitus with diabetic neuropathy, unspecified: Secondary | ICD-10-CM | POA: Diagnosis not present

## 2020-08-24 DIAGNOSIS — M542 Cervicalgia: Secondary | ICD-10-CM | POA: Diagnosis not present

## 2020-08-24 DIAGNOSIS — Z79899 Other long term (current) drug therapy: Secondary | ICD-10-CM | POA: Diagnosis not present

## 2020-08-24 DIAGNOSIS — M545 Low back pain, unspecified: Secondary | ICD-10-CM | POA: Diagnosis not present

## 2020-09-08 ENCOUNTER — Other Ambulatory Visit: Payer: Self-pay

## 2020-09-08 ENCOUNTER — Ambulatory Visit
Admission: RE | Admit: 2020-09-08 | Discharge: 2020-09-08 | Disposition: A | Discharge status: Home / Self Care | Payer: BC Managed Care – PPO | Source: Ambulatory Visit | Attending: Adult Health Nurse Practitioner | Admitting: Adult Health Nurse Practitioner

## 2020-09-08 DIAGNOSIS — M542 Cervicalgia: Secondary | ICD-10-CM

## 2020-09-08 DIAGNOSIS — M792 Neuralgia and neuritis, unspecified: Secondary | ICD-10-CM

## 2020-09-08 DIAGNOSIS — M5030 Other cervical disc degeneration, unspecified cervical region: Secondary | ICD-10-CM

## 2020-09-08 DIAGNOSIS — M4802 Spinal stenosis, cervical region: Secondary | ICD-10-CM | POA: Diagnosis not present

## 2020-09-14 DIAGNOSIS — E1165 Type 2 diabetes mellitus with hyperglycemia: Secondary | ICD-10-CM | POA: Diagnosis not present

## 2020-09-14 DIAGNOSIS — Z794 Long term (current) use of insulin: Secondary | ICD-10-CM | POA: Diagnosis not present

## 2020-09-16 DIAGNOSIS — G4733 Obstructive sleep apnea (adult) (pediatric): Secondary | ICD-10-CM | POA: Diagnosis not present

## 2020-09-16 DIAGNOSIS — E1140 Type 2 diabetes mellitus with diabetic neuropathy, unspecified: Secondary | ICD-10-CM | POA: Diagnosis not present

## 2020-09-16 DIAGNOSIS — Z794 Long term (current) use of insulin: Secondary | ICD-10-CM | POA: Diagnosis not present

## 2020-09-16 DIAGNOSIS — G8929 Other chronic pain: Secondary | ICD-10-CM | POA: Diagnosis not present

## 2020-09-16 DIAGNOSIS — Z01818 Encounter for other preprocedural examination: Secondary | ICD-10-CM | POA: Diagnosis not present

## 2020-09-16 DIAGNOSIS — Z780 Asymptomatic menopausal state: Secondary | ICD-10-CM | POA: Diagnosis not present

## 2020-09-16 DIAGNOSIS — M5440 Lumbago with sciatica, unspecified side: Secondary | ICD-10-CM | POA: Diagnosis not present

## 2020-09-23 DIAGNOSIS — N898 Other specified noninflammatory disorders of vagina: Secondary | ICD-10-CM | POA: Diagnosis not present

## 2020-09-23 DIAGNOSIS — N952 Postmenopausal atrophic vaginitis: Secondary | ICD-10-CM | POA: Diagnosis not present

## 2020-09-23 DIAGNOSIS — Z113 Encounter for screening for infections with a predominantly sexual mode of transmission: Secondary | ICD-10-CM | POA: Diagnosis not present

## 2020-09-23 DIAGNOSIS — R102 Pelvic and perineal pain: Secondary | ICD-10-CM | POA: Diagnosis not present

## 2020-09-23 DIAGNOSIS — B373 Candidiasis of vulva and vagina: Secondary | ICD-10-CM | POA: Diagnosis not present

## 2020-09-28 IMAGING — MR MR LUMBAR SPINE W/O CM
4 of 5 series · 24 of 48 positions shown · non-contrast
Comparison: MRI lumbar spine March 31, 2016

CLINICAL DATA: Low back pain radiating to LEFT lower extremity.
Fall at home. History of lumbar spine surgery.

EXAM:
MRI LUMBAR SPINE WITHOUT CONTRAST
TECHNIQUE: Multiplanar, multisequence MR imaging of the lumbar spine was
performed. No intravenous contrast was administered.

[Series 4: T2 · sagittal · 4.0mm · 0.55mm/px · 7 of 16 slices shown (1 of 2)]
[im 1/16]
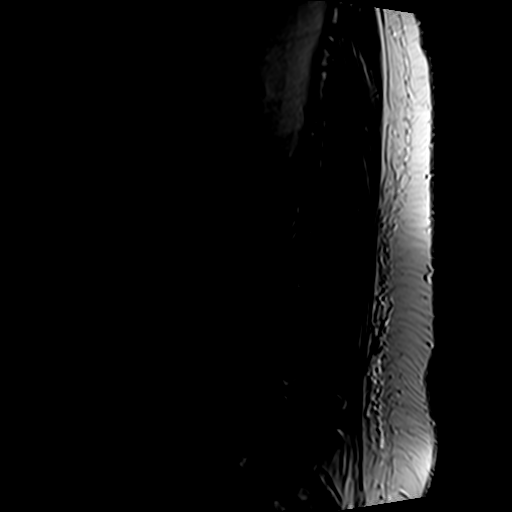
[im 3/16]
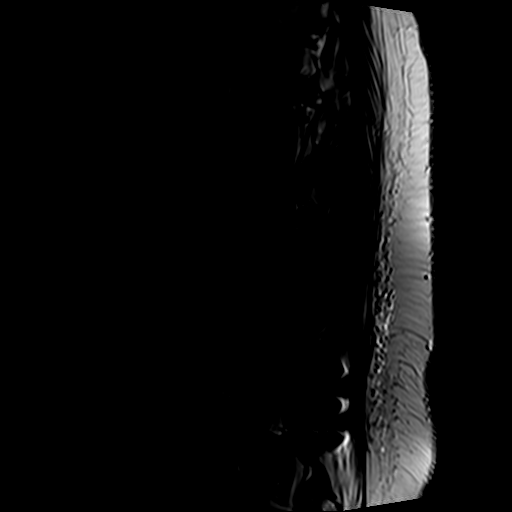
[im 6/16]
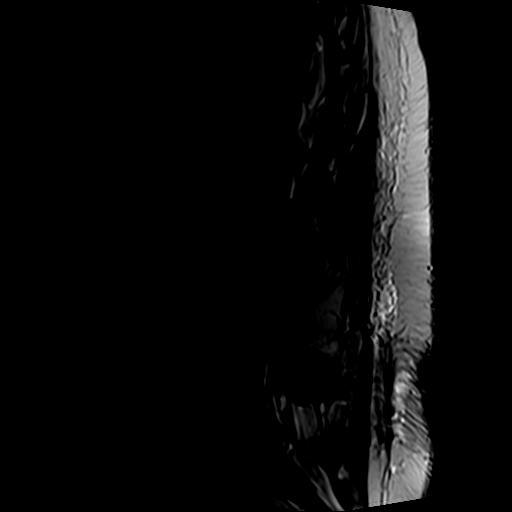
[im 8/16]
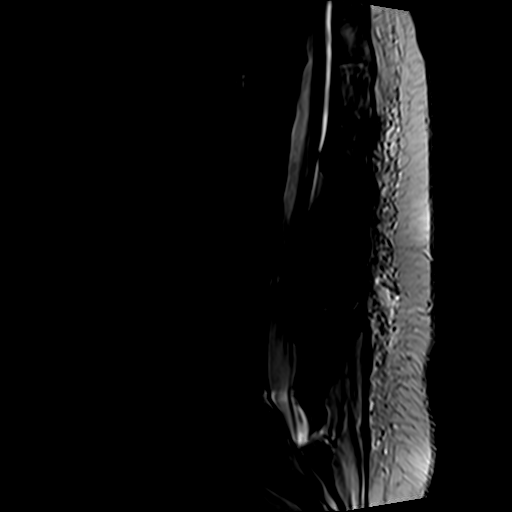
[im 11/16]
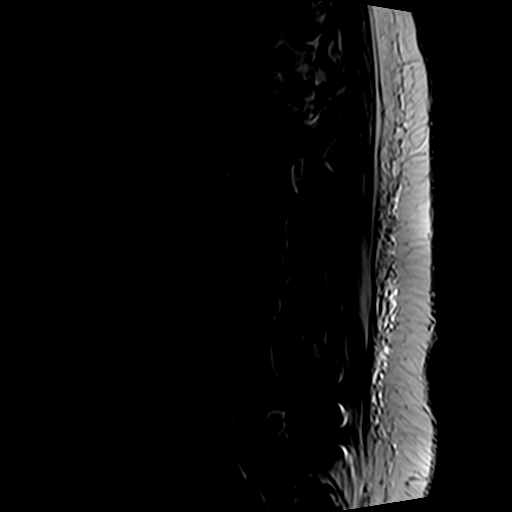
[im 13/16]
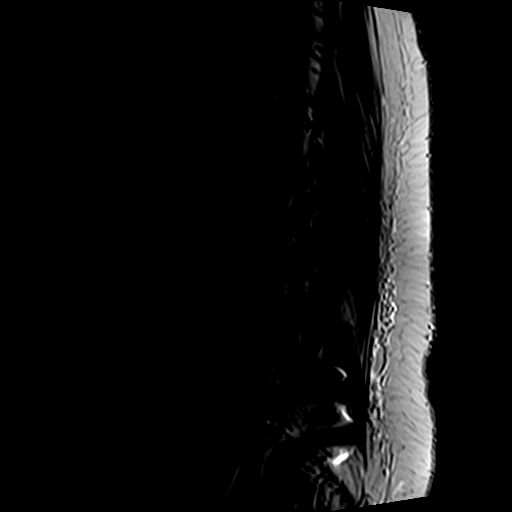
[im 16/16]
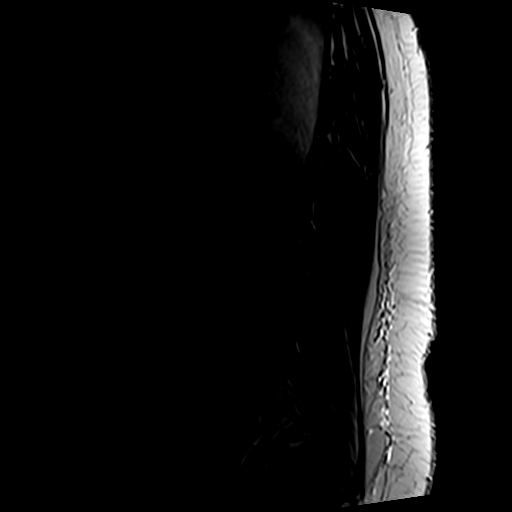

[Series 6: T1 · sagittal · 4.0mm · 0.55mm/px · 6 of 16 slices shown (1 of 2)]
[im 1/16]
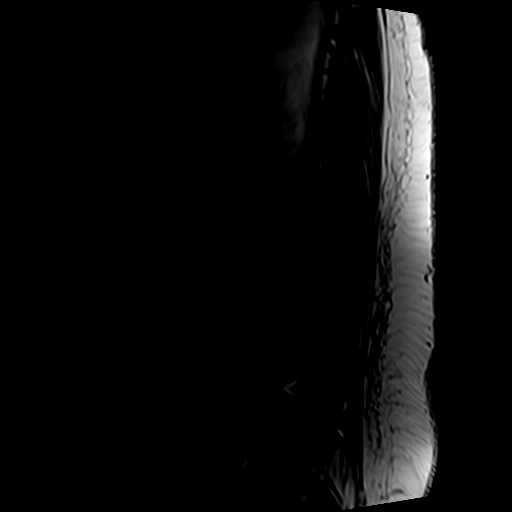
[im 4/16]
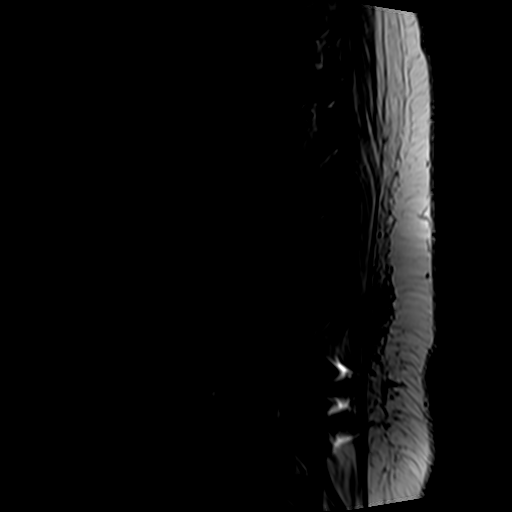
[im 7/16]
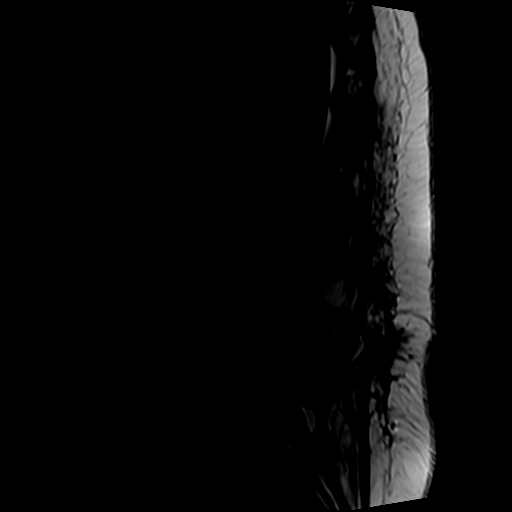
[im 10/16]
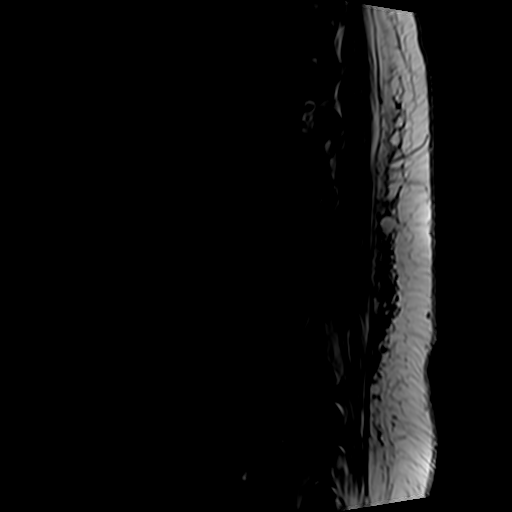
[im 13/16]
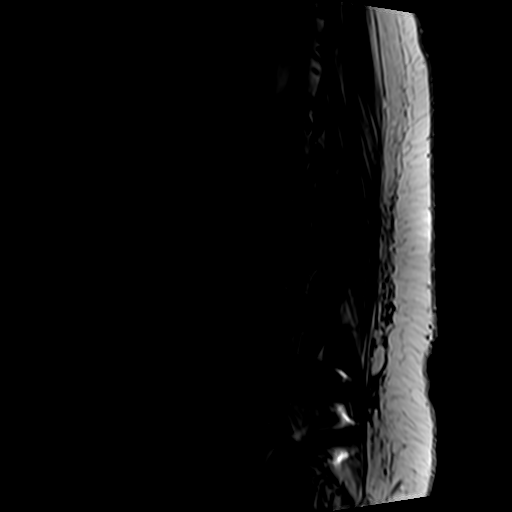
[im 16/16]
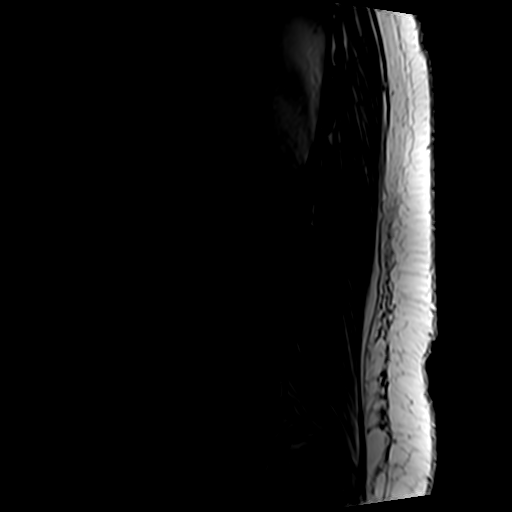

[Series 7: T2 · axial · 4.0mm · 0.70mm/px · z∈[-169,+46]mm · 8 of 36 slices shown (2 of 2)]
[im 1/36]
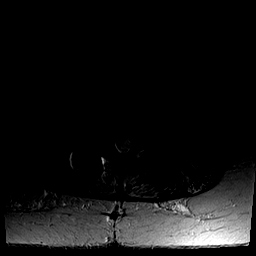
[im 6/36]
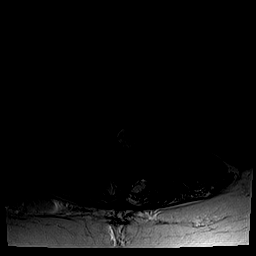
[im 11/36]
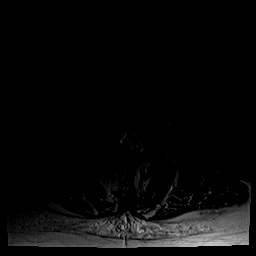
[im 17/36]
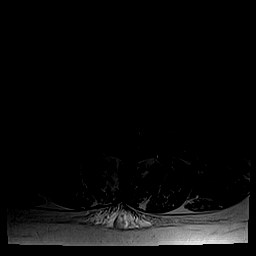
[im 19/36]
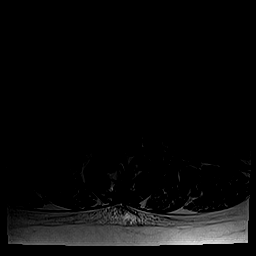
[im 25/36]
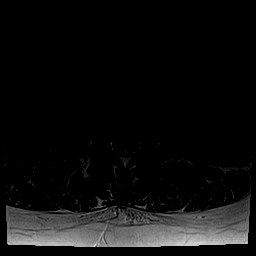
[im 30/36]
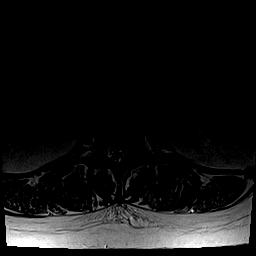
[im 36/36]
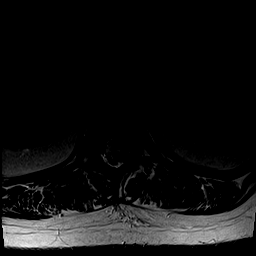

[Series 8: T1 · axial · 4.0mm · 0.35mm/px · z∈[-144,+15]mm · 3 of 36 slices shown (2 of 2)]
[im 6/36]
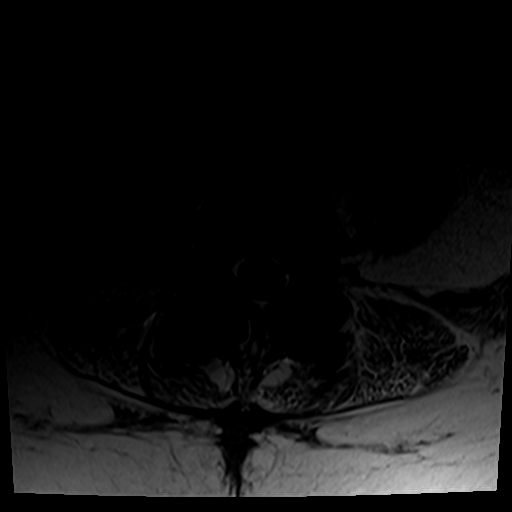
[im 19/36]
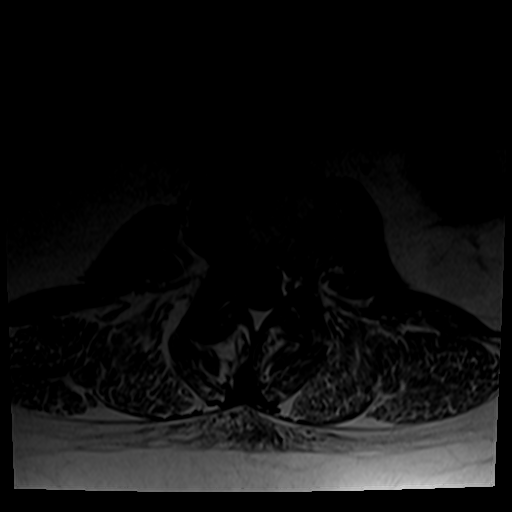
[im 30/36]
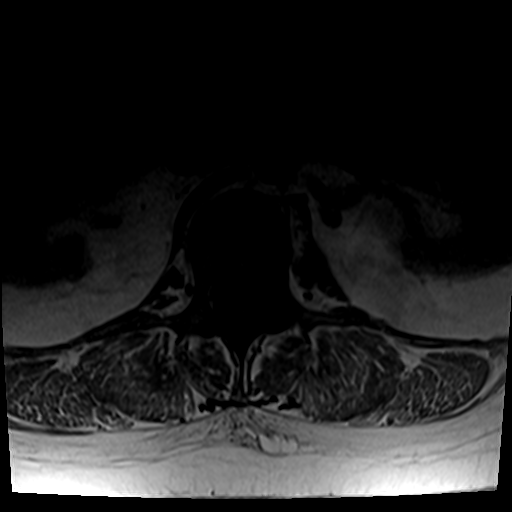

[24 of 48 positions shown; findings below may reference images not displayed]

FINDINGS: SEGMENTATION: For the purposes of this report, the last well-formed
intervertebral disc is reported as L5-S1.

ALIGNMENT: Maintained lumbar lordosis. No malalignment.

VERTEBRAE:Vertebral bodies are intact. Status post L5-S1 PLIF,
regional hardware artifact. Severe L2-3 and L3-4 disc height loss
with moderate subacute discogenic endplate changes and disc
desiccation. Remaining lumbar disc demonstrate normal morphology and
signal. No acute or abnormal bone marrow signal.

CONUS MEDULLARIS AND CAUDA EQUINA: Conus medullaris terminates at
L1-2 and demonstrates normal morphology and signal characteristics.
Cauda equina is normal.

PARASPINAL AND OTHER SOFT TISSUES: Moderate paraspinal muscle
atrophy.

DISC LEVELS:

T12-L1: No disc bulge, canal stenosis nor neural foraminal
narrowing.

L1-2: No disc bulge, canal stenosis nor neural foraminal narrowing.
Mild facet arthropathy.

L2-3: Moderate broad-based disc bulge. Mild facet arthropathy and
ligamentum flavum redundancy without canal stenosis. Minimal
bilateral neural foraminal narrowing.

L3-4: Moderate broad-based disc bulge, increased LEFT extraforaminal
disc protrusion displacing the exited LEFT L3 nerve. Mild facet
arthropathy and ligamentum flavum redundancy without canal stenosis.
Mild LEFT > RIGHT neural foraminal narrowing.

L4-5: No disc bulge, canal stenosis nor neural foraminal narrowing.
Moderate facet arthropathy.

L5-S1: PLIF posterior decompression without canal stenosis or neural
foraminal narrowing.
IMPRESSION: 1. Large LEFT L3-4 extraforaminal disc protrusion resulting in
exited LEFT L3 nerve impingement.
2. No acute osseous process.  L5-S1 PLIF.
3. No canal stenosis. Minimal to mild L2-3 and L3-4 neural foraminal
narrowing.

## 2020-09-30 DIAGNOSIS — I6381 Other cerebral infarction due to occlusion or stenosis of small artery: Secondary | ICD-10-CM | POA: Diagnosis not present

## 2020-09-30 DIAGNOSIS — G4733 Obstructive sleep apnea (adult) (pediatric): Secondary | ICD-10-CM | POA: Diagnosis not present

## 2020-09-30 DIAGNOSIS — Z9641 Presence of insulin pump (external) (internal): Secondary | ICD-10-CM | POA: Diagnosis not present

## 2020-09-30 DIAGNOSIS — M549 Dorsalgia, unspecified: Secondary | ICD-10-CM | POA: Diagnosis not present

## 2020-09-30 DIAGNOSIS — E1165 Type 2 diabetes mellitus with hyperglycemia: Secondary | ICD-10-CM | POA: Diagnosis not present

## 2020-09-30 DIAGNOSIS — G8929 Other chronic pain: Secondary | ICD-10-CM | POA: Diagnosis not present

## 2020-09-30 DIAGNOSIS — Z01818 Encounter for other preprocedural examination: Secondary | ICD-10-CM | POA: Diagnosis not present

## 2020-10-02 DIAGNOSIS — M797 Fibromyalgia: Secondary | ICD-10-CM | POA: Diagnosis not present

## 2020-10-05 DIAGNOSIS — M797 Fibromyalgia: Secondary | ICD-10-CM | POA: Diagnosis not present

## 2020-10-06 DIAGNOSIS — M797 Fibromyalgia: Secondary | ICD-10-CM | POA: Diagnosis not present

## 2020-10-07 DIAGNOSIS — M797 Fibromyalgia: Secondary | ICD-10-CM | POA: Diagnosis not present

## 2020-10-12 DIAGNOSIS — M797 Fibromyalgia: Secondary | ICD-10-CM | POA: Diagnosis not present

## 2020-10-13 DIAGNOSIS — E785 Hyperlipidemia, unspecified: Secondary | ICD-10-CM | POA: Diagnosis not present

## 2020-10-13 DIAGNOSIS — K05219 Aggressive periodontitis, localized, unspecified severity: Secondary | ICD-10-CM | POA: Diagnosis not present

## 2020-10-13 DIAGNOSIS — N390 Urinary tract infection, site not specified: Secondary | ICD-10-CM | POA: Diagnosis not present

## 2020-10-13 DIAGNOSIS — E119 Type 2 diabetes mellitus without complications: Secondary | ICD-10-CM | POA: Diagnosis not present

## 2020-10-13 DIAGNOSIS — M797 Fibromyalgia: Secondary | ICD-10-CM | POA: Diagnosis not present

## 2020-10-19 DIAGNOSIS — N760 Acute vaginitis: Secondary | ICD-10-CM | POA: Diagnosis not present

## 2020-10-19 DIAGNOSIS — R102 Pelvic and perineal pain: Secondary | ICD-10-CM | POA: Diagnosis not present

## 2020-10-19 DIAGNOSIS — Z113 Encounter for screening for infections with a predominantly sexual mode of transmission: Secondary | ICD-10-CM | POA: Diagnosis not present

## 2020-10-20 DIAGNOSIS — M797 Fibromyalgia: Secondary | ICD-10-CM | POA: Diagnosis not present

## 2020-10-21 ENCOUNTER — Other Ambulatory Visit: Payer: Self-pay

## 2020-10-21 ENCOUNTER — Emergency Department (HOSPITAL_COMMUNITY)
Admission: EM | Admit: 2020-10-21 | Discharge: 2020-10-21 | Disposition: A | Discharge status: Home / Self Care | Payer: BC Managed Care – PPO | Attending: Emergency Medicine | Admitting: Emergency Medicine

## 2020-10-21 DIAGNOSIS — R739 Hyperglycemia, unspecified: Secondary | ICD-10-CM

## 2020-10-21 DIAGNOSIS — E1165 Type 2 diabetes mellitus with hyperglycemia: Secondary | ICD-10-CM | POA: Insufficient documentation

## 2020-10-21 DIAGNOSIS — I10 Essential (primary) hypertension: Secondary | ICD-10-CM | POA: Diagnosis not present

## 2020-10-21 DIAGNOSIS — R110 Nausea: Secondary | ICD-10-CM | POA: Diagnosis not present

## 2020-10-21 DIAGNOSIS — Z794 Long term (current) use of insulin: Secondary | ICD-10-CM | POA: Insufficient documentation

## 2020-10-21 DIAGNOSIS — M549 Dorsalgia, unspecified: Secondary | ICD-10-CM | POA: Diagnosis not present

## 2020-10-21 DIAGNOSIS — R112 Nausea with vomiting, unspecified: Secondary | ICD-10-CM

## 2020-10-21 LAB — CBC
HCT: 44.3 % (ref 36.0–46.0)
Hemoglobin: 14.8 g/dL (ref 12.0–15.0)
MCH: 28.6 pg (ref 26.0–34.0)
MCHC: 33.4 g/dL (ref 30.0–36.0)
MCV: 85.7 fL (ref 80.0–100.0)
Platelets: 382 10*3/uL (ref 150–400)
RBC: 5.17 MIL/uL — ABNORMAL HIGH (ref 3.87–5.11)
RDW: 12.6 % (ref 11.5–15.5)
WBC: 11.9 10*3/uL — ABNORMAL HIGH (ref 4.0–10.5)
nRBC: 0 % (ref 0.0–0.2)

## 2020-10-21 LAB — COMPREHENSIVE METABOLIC PANEL
ALT: 14 U/L (ref 0–44)
AST: 16 U/L (ref 15–41)
Albumin: 4 g/dL (ref 3.5–5.0)
Alkaline Phosphatase: 103 U/L (ref 38–126)
Anion gap: 11 (ref 5–15)
BUN: 12 mg/dL (ref 6–20)
CO2: 24 mmol/L (ref 22–32)
Calcium: 9.3 mg/dL (ref 8.9–10.3)
Chloride: 100 mmol/L (ref 98–111)
Creatinine, Ser: 0.82 mg/dL (ref 0.44–1.00)
GFR, Estimated: >60 mL/min (ref >60)
Glucose, Bld: 315 mg/dL — ABNORMAL HIGH (ref 70–99)
Potassium: 4 mmol/L (ref 3.5–5.1)
Sodium: 135 mmol/L (ref 135–145)
Total Bilirubin: 0.8 mg/dL (ref 0.3–1.2)
Total Protein: 8.8 g/dL — ABNORMAL HIGH (ref 6.5–8.1)

## 2020-10-21 LAB — LIPASE, BLOOD: Lipase: 25 U/L (ref 11–51)

## 2020-10-21 MED ORDER — ONDANSETRON 4 MG PO TBDP: 4.0000 mg | ORAL_TABLET | Freq: Three times a day (TID) | ORAL | 0 refills | Status: DC | PRN

## 2020-10-21 MED ORDER — OXYCODONE-ACETAMINOPHEN 5-325 MG PO TABS
1.0000 | ORAL_TABLET | Freq: Once | ORAL | Status: AC
  Administered 2020-10-21: 1 via ORAL
  Filled 2020-10-21: qty 1

## 2020-10-21 MED ORDER — ONDANSETRON HCL 4 MG/2ML IJ SOLN
4.0000 mg | Freq: Once | INTRAMUSCULAR | Status: AC
  Administered 2020-10-21: 4 mg via INTRAVENOUS
  Filled 2020-10-21: qty 2

## 2020-10-21 NOTE — Discharge Instructions (Addendum)
Take Zofran as needed as prescribed for nausea and vomiting. Monitor your blood sugar and adjust your insulin as needed. Recheck with your doctor.

## 2020-10-21 NOTE — ED Notes (Signed)
Pt given ice chips, water, and crackers as ordered for PO challenge. Pt aware UA needed.

## 2020-10-21 NOTE — ED Provider Notes (Signed)
Emergency Medicine Provider Triage Evaluation Note  Karen Stein , a 50 y.o. female  was evaluated in triage.  Pt complains of n/v.  Review of Systems  Positive: Nausea, vomiting, back pain, back spasm Negative: Fever, cp, sob, dysuria  Physical Exam  BP (!) 167/91 (BP Location: Left Arm)   Pulse 78   Temp 98.7 F (37.1 C) (Oral)   Resp 18   LMP 11/13/2015 (Exact Date)   SpO2 95%  Gen:   Awake, laying in a fetal position, and appears uncomfortable Resp:  Normal effort  MSK:   Moves extremities without difficulty  Other:    Medical Decision Making  Medically screening exam initiated at 5:12 PM.  Appropriate orders placed.  CRISSY MCCREADIE was informed that the remainder of the evaluation will be completed by another provider, this initial triage assessment does not replace that evaluation, and the importance of remaining in the ED until their evaluation is complete.  Pt report n/v since this morning which prevents her from taking her usual medications and report worsening lower back pain and back spasm due to it.  Report hx of fibromyalgia and hx of DM.     Domenic Moras, PA-C 10/21/20 1713    Horton, Alvin Critchley, DO 10/21/20 2224

## 2020-10-21 NOTE — ED Provider Notes (Signed)
[facility] DEPT Provider Note   CSN: [id number] Arrival date & time: 10/21/20  1643     History No chief complaint on file.   Karen Stein is a 50 y.o. female.  50 year old female with history of diabetes presents with complaint of elevated blood sugar and vomiting. Patient states she woke up today and did not get her insulin pump on fast enough, her sugar spiked and she began vomiting. Patient was able to get her insulin pump on but continued to vomited so she called 911. Patient was given Zofran by EMS 5 hours ago which helped until just recently, feels like it is wearing off and needs ice chips and more Zofran. Denies fevers, chills, changes in bowel or bladder habits, abdominal pain or any other complaints or concerns.       Past Medical History:  Diagnosis Date   Anemia    Anxiety    Arthritis    Chronic pain    Complication of anesthesia    anesthesia awareness,   DDD (degenerative disc disease)    DDD (degenerative disc disease)    Degenerative disc disease    Degenerative disc disease    Diabetes mellitus    Type 2 insulin resistant   Dysrhythmia    stress and caffeine related   Endometriosis    Fibroids    Fibromyalgia    GERD (gastroesophageal reflux disease)    High cholesterol    Insomnia    Leg cramps    PCOS (polycystic ovarian syndrome)    Scoliosis    Sickle cell trait ([place])     Patient Active Problem List   Diagnosis Date Noted   DKA (diabetic ketoacidoses) 01/26/2019   Esophagitis    Hyperbilirubinemia    AKI (acute kidney injury) ([place])    OSA on CPAP    Lacunar infarction ([place])    HLD (hyperlipidemia) 07/31/2018   Prolonged QT interval 07/31/2018   Chest pain 07/31/2018   Nausea vomiting and diarrhea 07/31/2018   Abdominal pain 07/31/2018   Elevated blood protein 07/31/2018   Diabetes mellitus without complication ([place]) 44/06/4740   Anxiety    Depression 08/25/2017   Major depressive disorder,  recurrent severe without psychotic features ([place]) 10/12/2015   Degenerative disc disease    Sickle cell trait ([place])    DDD (degenerative disc disease), lumbar     Past Surgical History:  Procedure Laterality Date   BACK SURGERY  2010   BIOPSY BREAST     CESAREAN SECTION     DILATATION & CURETTAGE/HYSTEROSCOPY WITH MYOSURE N/A 01/01/2015   Procedure: DILATATION & CURETTAGE/HYSTEROSCOPY WITH MYOSURE;  Surgeon: Crawford Givens, MD;  Location: [facility];  Service: Gynecology;  Laterality: N/A;   LAPAROSCOPY  2002 or 2003   TOE SURGERY  2008   WISDOM TOOTH EXTRACTION       OB History     Gravida  4   Para  2   Term  1   Preterm  1   AB  2   Living  1      SAB  2   IAB      Ectopic      Multiple      Live Births              Family History  Problem Relation Age of Onset   Diabetes Father    Hypertension Father    Hyperlipidemia Father    Kidney failure Father    Diabetes  Paternal Grandfather    Hypertension Paternal Grandfather    Hyperlipidemia Paternal Grandfather    Diabetes Paternal Grandmother    Hypertension Paternal Grandmother    Hyperlipidemia Paternal Grandmother    Alzheimer's disease Maternal Grandmother    Brain cancer Maternal Grandfather     Social History   Tobacco Use   Smoking status: Never   Smokeless tobacco: Never  Vaping Use   Vaping Use: Never used  Substance Use Topics   Alcohol use: No    Alcohol/week: 0.0 standard drinks   Drug use: Yes    Types: Marijuana    Home Medications Prior to Admission medications   Medication Sig Start Date End Date Taking? Authorizing Provider  ondansetron (ZOFRAN ODT) 4 MG disintegrating tablet Take 1 tablet (4 mg total) by mouth every 8 (eight) hours as needed for nausea or vomiting. 10/21/20  Yes Tacy Learn, PA-C  atorvastatin (LIPITOR) 20 MG tablet Take 1 tablet (20 mg total) by mouth daily at 6 PM. Patient not taking: Reported on 12/02/2019 08/01/18   Radene Gunning, NP  ciprofloxacin  (CIPRO) 250 MG tablet Take 250 mg by mouth 2 (two) times daily. 7 day supply 11/21/19   [provider]  clindamycin (CLEOCIN) 300 MG capsule Take 1 capsule (300 mg total) by mouth 4 (four) times daily. X 7 days Patient not taking: Reported on 12/02/2019 08/23/19   Palumbo, April, MD  diclofenac (VOLTAREN) 75 MG EC tablet Take 75 mg by mouth 2 (two) times daily. 10/09/19   [provider]  Insulin Aspart, w/Niacinamide, (FIASP) 100 UNIT/ML SOLN Inject 5-16 Units into the skin with breakfast, with lunch, and with evening meal. Per sliding scale 09/11/19   [provider]  insulin glargine (LANTUS) 100 UNIT/ML injection Inject 0.15 mLs (15 Units total) into the skin at bedtime. 01/29/19   Mariel Aloe, MD  lidocaine (LIDODERM) 5 % Place 1 patch onto the skin daily. Remove & Discard patch within 12 hours or as directed by MD Patient not taking: Reported on 12/02/2019 08/23/19   Palumbo, April, MD  metoCLOPramide (REGLAN) 5 MG tablet Take 1 tablet (5 mg total) by mouth every 6 (six) hours as needed for nausea. 12/02/19   Robinson, [place] N, PA-C  metoprolol tartrate (LOPRESSOR) 25 MG tablet Take 1 tablet (25 mg total) by mouth 2 (two) times daily. Patient not taking: Reported on 01/17/2019 08/01/18   Radene Gunning, NP  Oxycodone HCl 10 MG TABS Take 10 mg by mouth every 6 (six) hours. 11/07/19   [provider]  pantoprazole (PROTONIX) 40 MG tablet Take 1 tablet (40 mg total) by mouth daily. Patient not taking: Reported on 12/02/2019 01/30/19   Mariel Aloe, MD  Semaglutide,0.25 or 0.5MG /DOS, (OZEMPIC, 0.25 OR 0.5 MG/DOSE,) 2 MG/1.5ML SOPN Inject 0.4 mLs into the skin once a week. 09/11/19   [provider]  tiZANidine (ZANAFLEX) 4 MG tablet Take 4 mg by mouth 3 (three) times daily. 01/11/19   [provider]    Allergies    Amoxicillin, Macrobid [nitrofurantoin macrocrystal], Shellfish allergy, and Sulfa antibiotics  Review of Systems   Review of Systems   Constitutional:  Negative for chills and fever.  Respiratory:  Negative for shortness of breath.   Cardiovascular:  Negative for chest pain.  Gastrointestinal:  Positive for nausea and vomiting. Negative for abdominal pain, constipation and diarrhea.  Genitourinary:  Negative for dysuria.  Musculoskeletal:  Negative for arthralgias and myalgias.  Skin:  Negative for rash  and wound.  Allergic/Immunologic: Positive for immunocompromised state.  Neurological:  Negative for weakness.  Hematological:  Negative for adenopathy.  Psychiatric/Behavioral:  Negative for confusion.   All other systems reviewed and are negative.  Physical Exam Updated Vital Signs BP (!) 150/97   Pulse 76   Temp 98.4 F (36.9 C) (Oral)   Resp 18   LMP 11/13/2015 (Exact Date)   SpO2 99%   Physical Exam Vitals and nursing note reviewed.  Constitutional:      General: She is not in acute distress.    Appearance: She is well-developed. She is not diaphoretic.  HENT:     Head: Normocephalic and atraumatic.     Mouth/Throat:     Mouth: Mucous membranes are moist.  Eyes:     Conjunctiva/sclera: Conjunctivae normal.  Cardiovascular:     Rate and Rhythm: Normal rate and regular rhythm.     Heart sounds: Normal heart sounds.  Pulmonary:     Effort: Pulmonary effort is normal.     Breath sounds: Normal breath sounds.  Abdominal:     Palpations: Abdomen is soft.     Tenderness: There is generalized abdominal tenderness.     Comments: Mild generalized tenderness  Musculoskeletal:     Right lower leg: No edema.     Left lower leg: No edema.  Skin:    General: Skin is warm and dry.     Findings: No erythema or rash.  Neurological:     Mental Status: She is alert and oriented to person, place, and time.  Psychiatric:        Mood and Affect: Mood is anxious.    ED Results / Procedures / Treatments   Labs (all labs ordered are listed, but only abnormal results are displayed) Labs Reviewed  COMPREHENSIVE  METABOLIC PANEL - Abnormal; Notable for the following components:      Result Value   Glucose, Bld 315 (*)    Total Protein 8.8 (*)    All other components within normal limits  CBC - Abnormal; Notable for the following components:   WBC 11.9 (*)    RBC 5.17 (*)    All other components within normal limits  LIPASE, BLOOD  URINALYSIS, ROUTINE W REFLEX MICROSCOPIC    EKG None  Radiology No results found.  Procedures Procedures   Medications Ordered in ED Medications  oxyCODONE-acetaminophen (PERCOCET/ROXICET) 5-325 MG per tablet 1 tablet (1 tablet Oral Given 10/21/20 1733)  ondansetron (ZOFRAN) injection 4 mg (4 mg Intravenous Given 10/21/20 2143)    ED Course  I have reviewed the triage vital signs and the nursing notes.  Pertinent labs & imaging results that were available during my care of the patient were reviewed by me and considered in my medical decision making (see chart for details).  Clinical Course as of 10/21/20 2306  Thu Oct 21, 7412  6911 50 year old female with vomiting as above, found to have mild generalized tenderness on exam. Patient is very anxious, feels like her zofran is wearing off and needs ice chips.  CBC is unremarkable, CMP with glucose of 315 without evidence of DKA. Lipase normal. Plan is for zofran and PO challenge. If able to tolerate POs, patient would like to be dc. [LM]  2305 Able to tolerate PO, given rx for zofran, recommend monitor CBG and follow up with PCP. [LM]    Clinical Course User Index [LM] Roque Lias   MDM Rules/Calculators/A&P  Final Clinical Impression(s) / ED Diagnoses Final diagnoses:  Non-intractable vomiting with nausea, unspecified vomiting type  Hyperglycemia    Rx / DC Orders ED Discharge Orders          Ordered    ondansetron (ZOFRAN ODT) 4 MG disintegrating tablet  Every 8 hours PRN        10/21/20 2224             Tacy Learn, PA-C 10/21/20 2306     Lacretia Leigh, MD 10/23/20 [phone]

## 2020-10-21 NOTE — ED Notes (Signed)
An After Visit Summary was printed and given to the patient. Discharge instructions given and no further questions at this time.  

## 2020-10-21 NOTE — ED Triage Notes (Addendum)
Pt presents via EMS with N/V and back pain. Hx of chronic back pain that has worsened today. Absess in mouth that she is on antibiotics for. Cbg 328. 4mg  zofran given en route. 20g RAC. Alert and oriented.

## 2020-10-21 NOTE — ED Notes (Signed)
ED Provider at bedside. 

## 2020-10-27 DIAGNOSIS — M797 Fibromyalgia: Secondary | ICD-10-CM | POA: Diagnosis not present

## 2020-11-02 DIAGNOSIS — E1165 Type 2 diabetes mellitus with hyperglycemia: Secondary | ICD-10-CM | POA: Diagnosis not present

## 2020-11-02 DIAGNOSIS — M797 Fibromyalgia: Secondary | ICD-10-CM | POA: Diagnosis not present

## 2020-11-02 DIAGNOSIS — F419 Anxiety disorder, unspecified: Secondary | ICD-10-CM | POA: Diagnosis not present

## 2020-11-02 DIAGNOSIS — M5136 Other intervertebral disc degeneration, lumbar region: Secondary | ICD-10-CM | POA: Diagnosis not present

## 2020-11-02 DIAGNOSIS — M961 Postlaminectomy syndrome, not elsewhere classified: Secondary | ICD-10-CM | POA: Diagnosis not present

## 2020-11-02 DIAGNOSIS — Z01818 Encounter for other preprocedural examination: Secondary | ICD-10-CM | POA: Diagnosis not present

## 2020-11-02 DIAGNOSIS — Z9641 Presence of insulin pump (external) (internal): Secondary | ICD-10-CM | POA: Diagnosis not present

## 2020-11-02 DIAGNOSIS — M546 Pain in thoracic spine: Secondary | ICD-10-CM | POA: Diagnosis not present

## 2020-11-02 DIAGNOSIS — G4733 Obstructive sleep apnea (adult) (pediatric): Secondary | ICD-10-CM | POA: Diagnosis not present

## 2020-11-03 DIAGNOSIS — M797 Fibromyalgia: Secondary | ICD-10-CM | POA: Diagnosis not present

## 2020-11-04 DIAGNOSIS — N179 Acute kidney failure, unspecified: Secondary | ICD-10-CM | POA: Diagnosis not present

## 2020-11-04 DIAGNOSIS — Z794 Long term (current) use of insulin: Secondary | ICD-10-CM | POA: Diagnosis not present

## 2020-11-04 DIAGNOSIS — D573 Sickle-cell trait: Secondary | ICD-10-CM | POA: Diagnosis not present

## 2020-11-04 DIAGNOSIS — K219 Gastro-esophageal reflux disease without esophagitis: Secondary | ICD-10-CM | POA: Diagnosis not present

## 2020-11-04 DIAGNOSIS — G4733 Obstructive sleep apnea (adult) (pediatric): Secondary | ICD-10-CM | POA: Diagnosis not present

## 2020-11-04 DIAGNOSIS — E1143 Type 2 diabetes mellitus with diabetic autonomic (poly)neuropathy: Secondary | ICD-10-CM | POA: Diagnosis not present

## 2020-11-04 DIAGNOSIS — Z006 Encounter for examination for normal comparison and control in clinical research program: Secondary | ICD-10-CM | POA: Diagnosis not present

## 2020-11-04 DIAGNOSIS — K3184 Gastroparesis: Secondary | ICD-10-CM | POA: Diagnosis not present

## 2020-11-04 DIAGNOSIS — E1165 Type 2 diabetes mellitus with hyperglycemia: Secondary | ICD-10-CM | POA: Diagnosis not present

## 2020-11-04 DIAGNOSIS — E1140 Type 2 diabetes mellitus with diabetic neuropathy, unspecified: Secondary | ICD-10-CM | POA: Diagnosis not present

## 2020-11-04 DIAGNOSIS — G894 Chronic pain syndrome: Secondary | ICD-10-CM | POA: Diagnosis not present

## 2020-11-04 DIAGNOSIS — M797 Fibromyalgia: Secondary | ICD-10-CM | POA: Diagnosis not present

## 2020-11-04 DIAGNOSIS — F1290 Cannabis use, unspecified, uncomplicated: Secondary | ICD-10-CM | POA: Diagnosis not present

## 2020-11-04 DIAGNOSIS — E785 Hyperlipidemia, unspecified: Secondary | ICD-10-CM | POA: Diagnosis not present

## 2020-11-04 DIAGNOSIS — Z4542 Encounter for adjustment and management of neuropacemaker (brain) (peripheral nerve) (spinal cord): Secondary | ICD-10-CM | POA: Diagnosis not present

## 2020-11-04 DIAGNOSIS — E669 Obesity, unspecified: Secondary | ICD-10-CM | POA: Diagnosis not present

## 2020-11-04 DIAGNOSIS — E559 Vitamin D deficiency, unspecified: Secondary | ICD-10-CM | POA: Diagnosis not present

## 2020-11-04 DIAGNOSIS — M5440 Lumbago with sciatica, unspecified side: Secondary | ICD-10-CM | POA: Diagnosis not present

## 2020-11-09 ENCOUNTER — Encounter (HOSPITAL_BASED_OUTPATIENT_CLINIC_OR_DEPARTMENT_OTHER): Payer: Self-pay

## 2020-11-09 ENCOUNTER — Emergency Department (HOSPITAL_BASED_OUTPATIENT_CLINIC_OR_DEPARTMENT_OTHER): Payer: BC Managed Care – PPO | Admitting: Radiology

## 2020-11-09 ENCOUNTER — Emergency Department (HOSPITAL_BASED_OUTPATIENT_CLINIC_OR_DEPARTMENT_OTHER)
Admission: EM | Admit: 2020-11-09 | Discharge: 2020-11-10 | Disposition: A | Discharge status: Home / Self Care | Payer: BC Managed Care – PPO | Attending: Emergency Medicine | Admitting: Emergency Medicine

## 2020-11-09 ENCOUNTER — Other Ambulatory Visit: Payer: Self-pay

## 2020-11-09 DIAGNOSIS — M797 Fibromyalgia: Secondary | ICD-10-CM | POA: Diagnosis not present

## 2020-11-09 DIAGNOSIS — E7800 Pure hypercholesterolemia, unspecified: Secondary | ICD-10-CM | POA: Insufficient documentation

## 2020-11-09 DIAGNOSIS — E1169 Type 2 diabetes mellitus with other specified complication: Secondary | ICD-10-CM | POA: Insufficient documentation

## 2020-11-09 DIAGNOSIS — M62838 Other muscle spasm: Secondary | ICD-10-CM | POA: Diagnosis not present

## 2020-11-09 DIAGNOSIS — R0781 Pleurodynia: Secondary | ICD-10-CM | POA: Diagnosis not present

## 2020-11-09 LAB — BASIC METABOLIC PANEL
Anion gap: 8 (ref 5–15)
BUN: 12 mg/dL (ref 6–20)
CO2: 24 mmol/L (ref 22–32)
Calcium: 8.3 mg/dL — ABNORMAL LOW (ref 8.9–10.3)
Chloride: 108 mmol/L (ref 98–111)
Creatinine, Ser: 0.79 mg/dL (ref 0.44–1.00)
GFR, Estimated: >60 mL/min (ref >60)
Glucose, Bld: 160 mg/dL — ABNORMAL HIGH (ref 70–99)
Potassium: 4.5 mmol/L (ref 3.5–5.1)
Sodium: 140 mmol/L (ref 135–145)

## 2020-11-09 LAB — CBC
HCT: 38.8 % (ref 36.0–46.0)
Hemoglobin: 12.9 g/dL (ref 12.0–15.0)
MCH: 28.7 pg (ref 26.0–34.0)
MCHC: 33.2 g/dL (ref 30.0–36.0)
MCV: 86.2 fL (ref 80.0–100.0)
Platelets: 288 10*3/uL (ref 150–400)
RBC: 4.5 MIL/uL (ref 3.87–5.11)
RDW: 12.7 % (ref 11.5–15.5)
WBC: 7.5 10*3/uL (ref 4.0–10.5)
nRBC: 0 % (ref 0.0–0.2)

## 2020-11-09 LAB — TROPONIN I (HIGH SENSITIVITY): Troponin I (High Sensitivity): 2 ng/L (ref <18)

## 2020-11-09 MED ORDER — ACETAMINOPHEN 500 MG PO TABS
1000.0000 mg | ORAL_TABLET | Freq: Once | ORAL | Status: AC
  Administered 2020-11-09: 1000 mg via ORAL
  Filled 2020-11-09: qty 2

## 2020-11-09 MED ORDER — METHOCARBAMOL 500 MG PO TABS
1000.0000 mg | ORAL_TABLET | Freq: Once | ORAL | Status: AC
  Administered 2020-11-09: 1000 mg via ORAL
  Filled 2020-11-09: qty 2

## 2020-11-09 NOTE — ED Triage Notes (Signed)
Pt takes lyrica, oxycodone and tizanidine and had  her first PT session today

## 2020-11-09 NOTE — ED Triage Notes (Signed)
Pt reports back pain - states had back surgery 5 days ago at [facility]   Denies fall

## 2020-11-09 NOTE — ED Triage Notes (Signed)
Pt also endorses CP and hard to  breath lying on her back

## 2020-11-10 ENCOUNTER — Emergency Department (HOSPITAL_BASED_OUTPATIENT_CLINIC_OR_DEPARTMENT_OTHER): Payer: BC Managed Care – PPO | Admitting: Radiology

## 2020-11-10 DIAGNOSIS — R079 Chest pain, unspecified: Secondary | ICD-10-CM | POA: Diagnosis not present

## 2020-11-10 DIAGNOSIS — M797 Fibromyalgia: Secondary | ICD-10-CM | POA: Diagnosis not present

## 2020-11-10 DIAGNOSIS — R0781 Pleurodynia: Secondary | ICD-10-CM | POA: Diagnosis not present

## 2020-11-10 LAB — TROPONIN I (HIGH SENSITIVITY): Troponin I (High Sensitivity): 2 ng/L (ref <18)

## 2020-11-10 MED ORDER — METHOCARBAMOL 500 MG PO TABS: 500.0000 mg | ORAL_TABLET | Freq: Three times a day (TID) | ORAL | 0 refills | Status: DC | PRN

## 2020-11-10 NOTE — Discharge Instructions (Addendum)
You were evaluated in the Emergency Department and after careful evaluation, we did not find any emergent condition requiring admission or further testing in the hospital.  Your exam/testing today was overall reassuring.  Symptoms seem to be due to strain or spasm of the muscles.  Recommend Tylenol 1000 mg every 4-6 hours as needed.  Can also take the Robaxin medication for more significant pain.  Please return to the Emergency Department if you experience any worsening of your condition.  Thank you for allowing [place] to be a part of your care.

## 2020-11-10 NOTE — ED Provider Notes (Signed)
[facility] Emergency Department Provider Note MRN:  [id number]  Arrival date & time: 11/10/20     Chief Complaint   Back Pain and Chest Pain   History of Present Illness   Karen Stein is a 50 y.o. year-old female with a history of chronic back pain presenting to the ED with chief complaint of back pain and chest pain.  Patient endorsing a cramping muscle spasm pain in the left lateral ribs, present for the past several hours.  She recently had a spinal stimulator placed a few days ago at [facility].  Denies any shortness of breath, no fever or cough, no abdominal pain, no vomiting or diarrhea, no other complaints.  Pain is intermittent, moderate to severe, no exacerbating or alleviating factors.  Review of Systems  A complete 10 system review of systems was obtained and all systems are negative except as noted in the HPI and PMH.   Patient's Health History    Past Medical History:  Diagnosis Date   Anemia    Anxiety    Arthritis    Chronic pain    Complication of anesthesia    anesthesia awareness,   DDD (degenerative disc disease)    DDD (degenerative disc disease)    Degenerative disc disease    Degenerative disc disease    Diabetes mellitus    Type 2 insulin resistant   Dysrhythmia    stress and caffeine related   Endometriosis    Fibroids    Fibromyalgia    GERD (gastroesophageal reflux disease)    High cholesterol    Insomnia    Leg cramps    PCOS (polycystic ovarian syndrome)    Scoliosis    Sickle cell trait ([place])     Past Surgical History:  Procedure Laterality Date   BACK SURGERY  2010   BIOPSY BREAST     CESAREAN SECTION     DILATATION & CURETTAGE/HYSTEROSCOPY WITH MYOSURE N/A 01/01/2015   Procedure: DILATATION & CURETTAGE/HYSTEROSCOPY WITH MYOSURE;  Surgeon: Crawford Givens, MD;  Location: [facility];  Service: Gynecology;  Laterality: N/A;   LAPAROSCOPY  2002 or 2003   TOE SURGERY  2008   WISDOM TOOTH EXTRACTION      Family History   Problem Relation Age of Onset   Diabetes Father    Hypertension Father    Hyperlipidemia Father    Kidney failure Father    Diabetes Paternal Grandfather    Hypertension Paternal Grandfather    Hyperlipidemia Paternal Grandfather    Diabetes Paternal Grandmother    Hypertension Paternal Grandmother    Hyperlipidemia Paternal Grandmother    Alzheimer's disease Maternal Grandmother    Brain cancer Maternal Grandfather     Social History   Socioeconomic History   Marital status: Married    Spouse name: Not on file   Number of children: Not on file   Years of education: Not on file   Highest education level: Not on file  Occupational History   Not on file  Tobacco Use   Smoking status: Never   Smokeless tobacco: Never  Vaping Use   Vaping Use: Never used  Substance and Sexual Activity   Alcohol use: No    Alcohol/week: 0.0 standard drinks   Drug use: Yes    Types: Marijuana   Sexual activity: Not on file  Other Topics Concern   Not on file  Social History Narrative   Not on file   Social Determinants of Health   Financial Resource Strain:  Not on file  Food Insecurity: Not on file  Transportation Needs: Not on file  Physical Activity: Not on file  Stress: Not on file  Social Connections: Not on file  Intimate Partner Violence: Not on file     Physical Exam   Vitals:   11/10/20 0033 11/10/20 0100  BP: (!) 153/91 (!) 157/78  Pulse: 87 77  Resp: 18 17  Temp:    SpO2: 100% 100%    CONSTITUTIONAL: Well-appearing, NAD NEURO:  Alert and oriented x 3, no focal deficits EYES:  eyes equal and reactive ENT/NECK:  no LAD, no JVD CARDIO: Regular rate, well-perfused, normal S1 and S2 PULM:  CTAB no wheezing or rhonchi GI/GU:  normal bowel sounds, non-distended, non-tender MSK/SPINE:  No gross deformities, no edema SKIN:  no rash, atraumatic PSYCH:  Appropriate speech and behavior  *Additional and/or pertinent findings included in MDM below  Diagnostic and  Interventional Summary    EKG Interpretation  Date/Time:  Tuesday November 09 2020 22:06:00 EDT Ventricular Rate:  92 PR Interval:  162 QRS Duration: 74 QT Interval:  342 QTC Calculation: 422 R Axis:   74 Text Interpretation: Normal sinus rhythm Normal ECG Confirmed by Gerlene Fee [id number]) on 11/10/2020 12:31:58 AM        Labs Reviewed  BASIC METABOLIC PANEL - Abnormal; Notable for the following components:      Result Value   Glucose, Bld 160 (*)    Calcium 8.3 (*)    All other components within normal limits  CBC  TROPONIN I (HIGH SENSITIVITY)  TROPONIN I (HIGH SENSITIVITY)    DG Chest 2 View  Final Result      Medications  methocarbamol (ROBAXIN) tablet 1,000 mg (1,000 mg Oral Given 11/09/20 2346)  acetaminophen (TYLENOL) tablet 1,000 mg (1,000 mg Oral Given 11/09/20 2346)     Procedures  /  Critical Care Procedures  ED Course and Medical Decision Making  I have reviewed the triage vital signs, the nursing notes, and pertinent available records from the EMR.  Listed above are laboratory and imaging tests that I personally ordered, reviewed, and interpreted and then considered in my medical decision making (see below for details).  Spasm-like pain to the musculature of the chest wall, doubt ACS, doubt pneumothorax.  EKG is reassuring with negative troponin x2, chest x-ray is normal.  Atelectasis also possibility though patient only received MAC anesthesia and it was a short case.  No evidence of DVT, no hypoxia, no tachycardia, no shortness of breath, highly doubt PE.  Patient is feeling better after muscle relaxer, work-up is normal, appropriate for discharge.       Barth Kirks. Sedonia Small, [place] [email or web address]  Final Clinical Impressions(s) / ED Diagnoses     ICD-10-CM   1. Rib pain  R07.81       ED Discharge Orders          Ordered    methocarbamol (ROBAXIN) 500 MG tablet  Every 8 hours PRN         11/10/20 0117             Discharge Instructions Discussed with and Provided to Patient:    Discharge Instructions      You were evaluated in the Emergency Department and after careful evaluation, we did not find any emergent condition requiring admission or further testing in the hospital.  Your exam/testing today was overall reassuring.  Symptoms seem to be due to strain or spasm  of the muscles.  Recommend Tylenol 1000 mg every 4-6 hours as needed.  Can also take the Robaxin medication for more significant pain.  Please return to the Emergency Department if you experience any worsening of your condition.  Thank you for allowing [place] to be a part of your care.        Maudie Flakes, MD 11/10/20 [phone]

## 2020-11-12 DIAGNOSIS — M797 Fibromyalgia: Secondary | ICD-10-CM | POA: Diagnosis not present

## 2020-11-15 DIAGNOSIS — G8929 Other chronic pain: Secondary | ICD-10-CM | POA: Diagnosis not present

## 2020-11-15 DIAGNOSIS — E1140 Type 2 diabetes mellitus with diabetic neuropathy, unspecified: Secondary | ICD-10-CM | POA: Diagnosis not present

## 2020-11-15 DIAGNOSIS — M5442 Lumbago with sciatica, left side: Secondary | ICD-10-CM | POA: Diagnosis not present

## 2020-11-15 DIAGNOSIS — M5441 Lumbago with sciatica, right side: Secondary | ICD-10-CM | POA: Diagnosis not present

## 2020-11-16 DIAGNOSIS — M797 Fibromyalgia: Secondary | ICD-10-CM | POA: Diagnosis not present

## 2020-11-18 DIAGNOSIS — M797 Fibromyalgia: Secondary | ICD-10-CM | POA: Diagnosis not present

## 2020-11-19 DIAGNOSIS — M797 Fibromyalgia: Secondary | ICD-10-CM | POA: Diagnosis not present

## 2020-11-23 DIAGNOSIS — Z79899 Other long term (current) drug therapy: Secondary | ICD-10-CM | POA: Diagnosis not present

## 2020-11-23 DIAGNOSIS — M5417 Radiculopathy, lumbosacral region: Secondary | ICD-10-CM | POA: Diagnosis not present

## 2020-11-23 DIAGNOSIS — M5412 Radiculopathy, cervical region: Secondary | ICD-10-CM | POA: Diagnosis not present

## 2020-11-23 DIAGNOSIS — M797 Fibromyalgia: Secondary | ICD-10-CM | POA: Diagnosis not present

## 2020-11-24 DIAGNOSIS — M797 Fibromyalgia: Secondary | ICD-10-CM | POA: Diagnosis not present

## 2020-12-01 DIAGNOSIS — Z794 Long term (current) use of insulin: Secondary | ICD-10-CM | POA: Diagnosis not present

## 2020-12-01 DIAGNOSIS — E1165 Type 2 diabetes mellitus with hyperglycemia: Secondary | ICD-10-CM | POA: Diagnosis not present

## 2020-12-07 DIAGNOSIS — M797 Fibromyalgia: Secondary | ICD-10-CM | POA: Diagnosis not present

## 2020-12-15 DIAGNOSIS — M797 Fibromyalgia: Secondary | ICD-10-CM | POA: Diagnosis not present

## 2020-12-20 DIAGNOSIS — M797 Fibromyalgia: Secondary | ICD-10-CM | POA: Diagnosis not present

## 2020-12-21 DIAGNOSIS — M5412 Radiculopathy, cervical region: Secondary | ICD-10-CM | POA: Diagnosis not present

## 2020-12-21 DIAGNOSIS — R2689 Other abnormalities of gait and mobility: Secondary | ICD-10-CM | POA: Diagnosis not present

## 2020-12-21 DIAGNOSIS — M5417 Radiculopathy, lumbosacral region: Secondary | ICD-10-CM | POA: Diagnosis not present

## 2020-12-21 DIAGNOSIS — Z9989 Dependence on other enabling machines and devices: Secondary | ICD-10-CM | POA: Diagnosis not present

## 2020-12-21 DIAGNOSIS — G603 Idiopathic progressive neuropathy: Secondary | ICD-10-CM | POA: Diagnosis not present

## 2020-12-21 DIAGNOSIS — Z79899 Other long term (current) drug therapy: Secondary | ICD-10-CM | POA: Diagnosis not present

## 2020-12-21 DIAGNOSIS — R201 Hypoesthesia of skin: Secondary | ICD-10-CM | POA: Diagnosis not present

## 2020-12-23 DIAGNOSIS — M797 Fibromyalgia: Secondary | ICD-10-CM | POA: Diagnosis not present

## 2020-12-27 DIAGNOSIS — M797 Fibromyalgia: Secondary | ICD-10-CM | POA: Diagnosis not present

## 2020-12-30 DIAGNOSIS — M797 Fibromyalgia: Secondary | ICD-10-CM | POA: Diagnosis not present

## 2021-01-04 DIAGNOSIS — M797 Fibromyalgia: Secondary | ICD-10-CM | POA: Diagnosis not present

## 2021-01-06 DIAGNOSIS — M797 Fibromyalgia: Secondary | ICD-10-CM | POA: Diagnosis not present

## 2021-01-09 ENCOUNTER — Other Ambulatory Visit: Payer: Self-pay

## 2021-01-09 ENCOUNTER — Encounter (HOSPITAL_BASED_OUTPATIENT_CLINIC_OR_DEPARTMENT_OTHER): Payer: Self-pay | Admitting: Emergency Medicine

## 2021-01-09 ENCOUNTER — Emergency Department (HOSPITAL_BASED_OUTPATIENT_CLINIC_OR_DEPARTMENT_OTHER)
Admission: EM | Admit: 2021-01-09 | Discharge: 2021-01-09 | Disposition: A | Discharge status: Home / Self Care | Payer: BC Managed Care – PPO | Attending: Emergency Medicine | Admitting: Emergency Medicine

## 2021-01-09 DIAGNOSIS — T25221A Burn of second degree of right foot, initial encounter: Secondary | ICD-10-CM | POA: Diagnosis not present

## 2021-01-09 DIAGNOSIS — T25021A Burn of unspecified degree of right foot, initial encounter: Secondary | ICD-10-CM | POA: Diagnosis not present

## 2021-01-09 DIAGNOSIS — E1110 Type 2 diabetes mellitus with ketoacidosis without coma: Secondary | ICD-10-CM | POA: Diagnosis not present

## 2021-01-09 DIAGNOSIS — Z23 Encounter for immunization: Secondary | ICD-10-CM | POA: Insufficient documentation

## 2021-01-09 DIAGNOSIS — Z794 Long term (current) use of insulin: Secondary | ICD-10-CM | POA: Insufficient documentation

## 2021-01-09 DIAGNOSIS — T310 Burns involving less than 10% of body surface: Secondary | ICD-10-CM | POA: Diagnosis not present

## 2021-01-09 DIAGNOSIS — X16XXXA Contact with hot heating appliances, radiators and pipes, initial encounter: Secondary | ICD-10-CM | POA: Diagnosis not present

## 2021-01-09 MED ORDER — TETANUS-DIPHTH-ACELL PERTUSSIS 5-2.5-18.5 LF-MCG/0.5 IM SUSY
0.5000 mL | PREFILLED_SYRINGE | Freq: Once | INTRAMUSCULAR | Status: AC
  Administered 2021-01-09: 0.5 mL via INTRAMUSCULAR
  Filled 2021-01-09: qty 0.5

## 2021-01-09 MED ORDER — ONDANSETRON 4 MG PO TBDP: 4.0000 mg | ORAL_TABLET | Freq: Three times a day (TID) | ORAL | 0 refills | Status: DC | PRN

## 2021-01-09 MED ORDER — DOXYCYCLINE HYCLATE 100 MG PO CAPS: 100.0000 mg | ORAL_CAPSULE | Freq: Two times a day (BID) | ORAL | 0 refills | Status: DC

## 2021-01-09 NOTE — ED Notes (Signed)
Right foot cleansed and dressed with xeroform and dry dressing per MD order. Reviewed skin care, dc instructions and prescriptions with patient. Patient voiced understanding.

## 2021-01-09 NOTE — ED Triage Notes (Signed)
Right foot blister/pain. Pt has peripheral neuropathy, was driving and her right foot was burning, thought it was her neuropathy. Once she got home pt realized the radiator had been leaking, so hot water had been dripping on her foot in the car and caused blistering.
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
## 2021-01-10 DIAGNOSIS — M797 Fibromyalgia: Secondary | ICD-10-CM | POA: Diagnosis not present

## 2021-01-13 DIAGNOSIS — S90424S Blister (nonthermal), right lesser toe(s), sequela: Secondary | ICD-10-CM | POA: Diagnosis not present

## 2021-01-13 DIAGNOSIS — B351 Tinea unguium: Secondary | ICD-10-CM | POA: Diagnosis not present

## 2021-01-18 DIAGNOSIS — M797 Fibromyalgia: Secondary | ICD-10-CM | POA: Diagnosis not present

## 2021-01-20 DIAGNOSIS — M797 Fibromyalgia: Secondary | ICD-10-CM | POA: Diagnosis not present

## 2021-01-24 DIAGNOSIS — E785 Hyperlipidemia, unspecified: Secondary | ICD-10-CM | POA: Diagnosis not present

## 2021-01-24 DIAGNOSIS — M797 Fibromyalgia: Secondary | ICD-10-CM | POA: Diagnosis not present

## 2021-01-24 DIAGNOSIS — N390 Urinary tract infection, site not specified: Secondary | ICD-10-CM | POA: Diagnosis not present

## 2021-01-24 DIAGNOSIS — E119 Type 2 diabetes mellitus without complications: Secondary | ICD-10-CM | POA: Diagnosis not present

## 2021-01-24 DIAGNOSIS — Z0000 Encounter for general adult medical examination without abnormal findings: Secondary | ICD-10-CM | POA: Diagnosis not present

## 2021-01-25 DIAGNOSIS — M797 Fibromyalgia: Secondary | ICD-10-CM | POA: Diagnosis not present

## 2021-01-28 DIAGNOSIS — M797 Fibromyalgia: Secondary | ICD-10-CM | POA: Diagnosis not present

## 2021-02-01 DIAGNOSIS — M797 Fibromyalgia: Secondary | ICD-10-CM | POA: Diagnosis not present

## 2021-02-04 DIAGNOSIS — M797 Fibromyalgia: Secondary | ICD-10-CM | POA: Diagnosis not present

## 2021-02-07 DIAGNOSIS — M797 Fibromyalgia: Secondary | ICD-10-CM | POA: Diagnosis not present

## 2021-02-08 DIAGNOSIS — M797 Fibromyalgia: Secondary | ICD-10-CM | POA: Diagnosis not present

## 2021-02-11 DIAGNOSIS — M797 Fibromyalgia: Secondary | ICD-10-CM | POA: Diagnosis not present

## 2021-02-15 DIAGNOSIS — M797 Fibromyalgia: Secondary | ICD-10-CM | POA: Diagnosis not present

## 2021-02-17 DIAGNOSIS — M797 Fibromyalgia: Secondary | ICD-10-CM | POA: Diagnosis not present

## 2021-02-28 DIAGNOSIS — H918X3 Other specified hearing loss, bilateral: Secondary | ICD-10-CM | POA: Diagnosis not present

## 2021-02-28 DIAGNOSIS — H6502 Acute serous otitis media, left ear: Secondary | ICD-10-CM | POA: Diagnosis not present

## 2021-02-28 DIAGNOSIS — N390 Urinary tract infection, site not specified: Secondary | ICD-10-CM | POA: Diagnosis not present

## 2021-02-28 DIAGNOSIS — E119 Type 2 diabetes mellitus without complications: Secondary | ICD-10-CM | POA: Diagnosis not present

## 2021-03-01 DIAGNOSIS — M797 Fibromyalgia: Secondary | ICD-10-CM | POA: Diagnosis not present

## 2021-03-03 DIAGNOSIS — M797 Fibromyalgia: Secondary | ICD-10-CM | POA: Diagnosis not present

## 2021-03-04 DIAGNOSIS — M797 Fibromyalgia: Secondary | ICD-10-CM | POA: Diagnosis not present

## 2021-03-07 DIAGNOSIS — Z7985 Long-term (current) use of injectable non-insulin antidiabetic drugs: Secondary | ICD-10-CM | POA: Diagnosis not present

## 2021-03-07 DIAGNOSIS — E782 Mixed hyperlipidemia: Secondary | ICD-10-CM | POA: Diagnosis not present

## 2021-03-07 DIAGNOSIS — Z794 Long term (current) use of insulin: Secondary | ICD-10-CM | POA: Diagnosis not present

## 2021-03-07 DIAGNOSIS — Z6835 Body mass index (BMI) 35.0-35.9, adult: Secondary | ICD-10-CM | POA: Diagnosis not present

## 2021-03-07 DIAGNOSIS — E1165 Type 2 diabetes mellitus with hyperglycemia: Secondary | ICD-10-CM | POA: Diagnosis not present

## 2021-03-07 DIAGNOSIS — E669 Obesity, unspecified: Secondary | ICD-10-CM | POA: Diagnosis not present

## 2021-03-08 DIAGNOSIS — M797 Fibromyalgia: Secondary | ICD-10-CM | POA: Diagnosis not present

## 2021-03-09 DIAGNOSIS — M797 Fibromyalgia: Secondary | ICD-10-CM | POA: Diagnosis not present

## 2021-03-15 DIAGNOSIS — M797 Fibromyalgia: Secondary | ICD-10-CM | POA: Diagnosis not present

## 2021-03-17 ENCOUNTER — Emergency Department (HOSPITAL_BASED_OUTPATIENT_CLINIC_OR_DEPARTMENT_OTHER): Payer: BC Managed Care – PPO

## 2021-03-17 ENCOUNTER — Emergency Department (HOSPITAL_BASED_OUTPATIENT_CLINIC_OR_DEPARTMENT_OTHER)
Admission: EM | Admit: 2021-03-17 | Discharge: 2021-03-17 | Disposition: A | Discharge status: Home / Self Care | Payer: BC Managed Care – PPO | Attending: Emergency Medicine | Admitting: Emergency Medicine

## 2021-03-17 ENCOUNTER — Other Ambulatory Visit: Payer: Self-pay

## 2021-03-17 ENCOUNTER — Emergency Department (HOSPITAL_BASED_OUTPATIENT_CLINIC_OR_DEPARTMENT_OTHER): Payer: BC Managed Care – PPO | Admitting: Radiology

## 2021-03-17 ENCOUNTER — Encounter (HOSPITAL_BASED_OUTPATIENT_CLINIC_OR_DEPARTMENT_OTHER): Payer: Self-pay | Admitting: Emergency Medicine

## 2021-03-17 DIAGNOSIS — I6523 Occlusion and stenosis of bilateral carotid arteries: Secondary | ICD-10-CM | POA: Diagnosis not present

## 2021-03-17 DIAGNOSIS — R519 Headache, unspecified: Secondary | ICD-10-CM

## 2021-03-17 DIAGNOSIS — Z79899 Other long term (current) drug therapy: Secondary | ICD-10-CM | POA: Insufficient documentation

## 2021-03-17 DIAGNOSIS — R079 Chest pain, unspecified: Secondary | ICD-10-CM | POA: Diagnosis not present

## 2021-03-17 DIAGNOSIS — I10 Essential (primary) hypertension: Secondary | ICD-10-CM | POA: Insufficient documentation

## 2021-03-17 DIAGNOSIS — E119 Type 2 diabetes mellitus without complications: Secondary | ICD-10-CM | POA: Diagnosis not present

## 2021-03-17 DIAGNOSIS — Z794 Long term (current) use of insulin: Secondary | ICD-10-CM | POA: Insufficient documentation

## 2021-03-17 LAB — CBC
HCT: 39.1 % (ref 36.0–46.0)
Hemoglobin: 12.9 g/dL (ref 12.0–15.0)
MCH: 28.7 pg (ref 26.0–34.0)
MCHC: 33 g/dL (ref 30.0–36.0)
MCV: 87.1 fL (ref 80.0–100.0)
Platelets: 324 10*3/uL (ref 150–400)
RBC: 4.49 MIL/uL (ref 3.87–5.11)
RDW: 12.9 % (ref 11.5–15.5)
WBC: 7.1 10*3/uL (ref 4.0–10.5)
nRBC: 0 % (ref 0.0–0.2)

## 2021-03-17 LAB — BASIC METABOLIC PANEL
Anion gap: 5 (ref 5–15)
BUN: 10 mg/dL (ref 6–20)
CO2: 28 mmol/L (ref 22–32)
Calcium: 8.9 mg/dL (ref 8.9–10.3)
Chloride: 101 mmol/L (ref 98–111)
Creatinine, Ser: 0.81 mg/dL (ref 0.44–1.00)
GFR, Estimated: >60 mL/min (ref >60)
Glucose, Bld: 229 mg/dL — ABNORMAL HIGH (ref 70–99)
Potassium: 3.8 mmol/L (ref 3.5–5.1)
Sodium: 134 mmol/L — ABNORMAL LOW (ref 135–145)

## 2021-03-17 LAB — TROPONIN I (HIGH SENSITIVITY)
Troponin I (High Sensitivity): <2 ng/L (ref <18)
Troponin I (High Sensitivity): <2 ng/L (ref <18)

## 2021-03-17 LAB — PROTIME-INR
INR: 0.9 (ref 0.8–1.2)
Prothrombin Time: 12.5 seconds (ref 11.4–15.2)

## 2021-03-17 MED ORDER — ASPIRIN 81 MG PO CHEW: 81.0000 mg | CHEWABLE_TABLET | Freq: Every day | ORAL | 2 refills | Status: AC

## 2021-03-17 MED ORDER — IOHEXOL 350 MG/ML SOLN
75.0000 mL | Freq: Once | INTRAVENOUS | Status: AC | PRN
  Administered 2021-03-17: 16:00:00 75 mL via INTRAVENOUS

## 2021-03-17 NOTE — ED Provider Notes (Signed)
[facility] EMERGENCY DEPT Provider Note   CSN: [id number] Arrival date & time: 03/17/21  1207     History Chief Complaint  Patient presents with   Chest Pain    Karen Stein is a 50 y.o. female.  HPI Patient has several complaints.  She reports she has fibromyalgia and at baseline has a lot of areas of pain.  She reports her main concern today is that she had a sudden severe headache that shot a sharp pain on the left side of her head from the back of her head to the front of her head.  She reports it did resolve pretty quickly.  She is concerned about possible stroke.  She reports multiple family members had stroke and this is her greatest concern.  She reports she did not have any visual changes.  No focal weakness numbness or tingling of arms or legs.  She reports over the past few days she has been experiencing some chest pains.  They are brief and sharp in nature.  They do spontaneously resolved.  She feels like her ankles are more swollen.  She reports that now that she has been lying down her feet even up is not that noticeable.  Reports she is concerned because several years ago she was told that she had an enlarged heart but that everything was "okay".  Patient does not recall having had an echocardiogram with her physician locally.  She reports when she was diagnosed with the enlarged heart it was because she was visiting her daughter who is a Marine scientist several years ago.  She reports at that time her daughter noted that her socks had indented her ankles and they went to the hospital and she had evaluation.  Patient reports she is due for her home pain medications.  She reports when she does not take her home pain medications she gets a lot of pain throughout her body.  She reports at baseline she has neuropathy in her feet.    Past Medical History:  Diagnosis Date   Anemia    Anxiety    Arthritis    Chronic pain    Complication of anesthesia    anesthesia  awareness,   DDD (degenerative disc disease)    DDD (degenerative disc disease)    Degenerative disc disease    Degenerative disc disease    Diabetes mellitus    Type 2 insulin resistant   Dysrhythmia    stress and caffeine related   Endometriosis    Fibroids    Fibromyalgia    GERD (gastroesophageal reflux disease)    High cholesterol    Insomnia    Leg cramps    PCOS (polycystic ovarian syndrome)    Scoliosis    Sickle cell trait ([place])     Patient Active Problem List   Diagnosis Date Noted   DKA (diabetic ketoacidoses) 01/26/2019   Esophagitis    Hyperbilirubinemia    AKI (acute kidney injury) ([place])    OSA on CPAP    Lacunar infarction (HCC)    HLD (hyperlipidemia) 07/31/2018   Prolonged QT interval 07/31/2018   Chest pain 07/31/2018   Nausea vomiting and diarrhea 07/31/2018   Abdominal pain 07/31/2018   Elevated blood protein 07/31/2018   Diabetes mellitus without complication ([place]) 22/97/9892   Anxiety    Depression 08/25/2017   Major depressive disorder, 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   Degenerative disc disease    Sickle cell trait ([place])    DDD (  degenerative disc disease), lumbar     Past Surgical History:  Procedure Laterality Date   BACK SURGERY  2010   BIOPSY BREAST     CESAREAN SECTION     DILATATION & CURETTAGE/HYSTEROSCOPY WITH MYOSURE N/A 01/01/2015   Procedure: DILATATION & CURETTAGE/HYSTEROSCOPY WITH MYOSURE;  Surgeon: Crawford Givens, MD;  Location: [facility];  Service: Gynecology;  Laterality: N/A;   LAPAROSCOPY  2002 or 2003   TOE SURGERY  2008   WISDOM TOOTH EXTRACTION       OB History     Gravida  4   Para  2   Term  1   Preterm  1   AB  2   Living  1      SAB  2   IAB      Ectopic      Multiple      Live Births              Family History  Problem Relation Age of Onset   Diabetes Father    Hypertension Father    Hyperlipidemia Father    Kidney failure Father    Diabetes Paternal  Grandfather    Hypertension Paternal Grandfather    Hyperlipidemia Paternal Grandfather    Diabetes Paternal Grandmother    Hypertension Paternal Grandmother    Hyperlipidemia Paternal Grandmother    Alzheimer's disease Maternal Grandmother    Brain cancer Maternal Grandfather     Social History   Tobacco Use   Smoking status: Never   Smokeless tobacco: Never  Vaping Use   Vaping Use: Never used  Substance Use Topics   Alcohol use: No    Alcohol/week: 0.0 standard drinks   Drug use: Yes    Types: Marijuana    Home Medications Prior to Admission medications   Medication Sig Start Date End Date Taking? Authorizing Provider  ciclopirox (PENLAC) 8 % solution Apply topically. 01/13/21  Yes [provider]  Insulin Aspart, w/Niacinamide, (FIASP) 100 UNIT/ML SOLN Inject 5-16 Units into the skin with breakfast, with lunch, and with evening meal. Per sliding scale 09/11/19  Yes [provider]  methocarbamol (ROBAXIN) 500 MG tablet Take 1 tablet (500 mg total) by mouth every 8 (eight) hours as needed for muscle spasms. 11/10/20  Yes Maudie Flakes, MD  metoCLOPramide (REGLAN) 5 MG tablet Take 1 tablet (5 mg total) by mouth every 6 (six) hours as needed for nausea. 12/02/19  Yes Quentin Cornwall, [place] N, PA-C  ondansetron (ZOFRAN ODT) 4 MG disintegrating tablet Take 1 tablet (4 mg total) by mouth every 8 (eight) hours as needed for nausea or vomiting. 01/09/21  Yes Tegeler, Gwenyth Allegra, MD  Oxycodone HCl 10 MG TABS Take 10 mg by mouth every 6 (six) hours. 11/07/19  Yes [provider]  pregabalin (LYRICA) 200 MG capsule Take 200 mg by mouth 3 (three) times daily. 01/27/21  Yes [provider]  Semaglutide,0.25 or 0.5MG /DOS, (OZEMPIC, 0.25 OR 0.5 MG/DOSE,) 2 MG/1.5ML SOPN Inject 0.4 mLs into the skin once a week. 09/11/19  Yes [provider]  atorvastatin (LIPITOR) 20 MG tablet Take 1 tablet (20 mg total) by mouth daily at 6 PM. Patient not taking: Reported  on 12/02/2019 08/01/18   Radene Gunning, NP  clindamycin (CLEOCIN) 300 MG capsule Take 1 capsule (300 mg total) by mouth 4 (four) times daily. X 7 days Patient not taking: Reported on 12/02/2019 08/23/19   Palumbo, April, MD  doxycycline (VIBRAMYCIN) 100 MG capsule Take 1 capsule (  100 mg total) by mouth 2 (two) times daily. Patient not taking: Reported on 03/17/2021 01/09/21   Tegeler, Gwenyth Allegra, MD  insulin glargine (LANTUS) 100 UNIT/ML injection Inject 0.15 mLs (15 Units total) into the skin at bedtime. Patient not taking: Reported on 03/17/2021 01/29/19   Mariel Aloe, MD  lidocaine (LIDODERM) 5 % Place 1 patch onto the skin daily. Remove & Discard patch within 12 hours or as directed by MD Patient not taking: Reported on 12/02/2019 08/23/19   Palumbo, April, MD  metoprolol tartrate (LOPRESSOR) 25 MG tablet Take 1 tablet (25 mg total) by mouth 2 (two) times daily. Patient not taking: Reported on 01/17/2019 08/01/18   Radene Gunning, NP  pantoprazole (PROTONIX) 40 MG tablet Take 1 tablet (40 mg total) by mouth daily. Patient not taking: Reported on 12/02/2019 01/30/19   Mariel Aloe, MD  rosuvastatin (CRESTOR) 20 MG tablet Take 20 mg by mouth at bedtime. 03/11/21   [provider]    Allergies    Amoxicillin, Macrobid [nitrofurantoin macrocrystal], Shellfish allergy, and Sulfa antibiotics  Review of Systems   Review of Systems 10 systems reviewed and negative except as per HPI Physical Exam Updated Vital Signs BP (!) 162/82   Pulse 74   Temp 98.4 F (36.9 C)   Resp 19   Ht 5\' 8"  (1.727 m)   Wt 106.6 kg   LMP 11/13/2015 (Exact Date)   SpO2 100%   BMI 35.73 kg/m   Physical Exam Constitutional:      Comments: Alert nontoxic otherwise well in appearance.  No respiratory distress  HENT:     Head: Normocephalic and atraumatic.     Nose: Nose normal.     Mouth/Throat:     Mouth: Mucous membranes are moist.     Pharynx: Oropharynx is clear.  Eyes:     Extraocular Movements:  Extraocular movements intact.     Pupils: Pupils are equal, round, and reactive to light.  Cardiovascular:     Rate and Rhythm: Normal rate and regular rhythm.  Pulmonary:     Effort: Pulmonary effort is normal.     Breath sounds: Normal breath sounds.  Abdominal:     General: There is no distension.     Palpations: Abdomen is soft.     Tenderness: There is no abdominal tenderness. There is no guarding.  Musculoskeletal:        General: Normal range of motion.     Cervical back: Neck supple.     Comments: Minimal swelling at the ankles.  She does not have edema of the feet of the lower legs.  Calves are soft and nontender.  Still pulses are 2+ and symmetric feet are warm and dry  Skin:    General: Skin is warm and dry.  Neurological:     General: No focal deficit present.     Mental Status: She is oriented to person, place, and time.     Cranial Nerves: No cranial nerve deficit.     Sensory: No sensory deficit.     Motor: No weakness.     Coordination: Coordination normal.     Comments: Normal finger-nose exam bilaterally.  Normal heel shin exam bilaterally.  Speech is clear with normal content and cadence.  No signs of expressive or receptive aphasia.  Upper and lower motor strength 5\5.    ED Results / Procedures / Treatments   Labs (all labs ordered are listed, but only abnormal results are displayed) Labs Reviewed  BASIC METABOLIC PANEL - Abnormal; Notable for the following components:      Result Value   Sodium 134 (*)    Glucose, Bld 229 (*)    All other components within normal limits  CBC  PROTIME-INR  PREGNANCY, URINE  TROPONIN I (HIGH SENSITIVITY)  TROPONIN I (HIGH SENSITIVITY)    EKG EKG Interpretation  Date/Time:  Thursday March 17 2021 12:23:48 EST Ventricular Rate:  79 PR Interval:  168 QRS Duration: 82 QT Interval:  374 QTC Calculation: 428 R Axis:   -2 Text Interpretation: Normal sinus rhythm Possible Left atrial enlargement Borderline ECG no  sig change from previous Confirmed by Charlesetta Shanks [phone]) on 03/17/2021 2:20:17 PM  Radiology DG Chest 2 View  Result Date: 03/17/2021 CLINICAL DATA:  Chest pain EXAM: CHEST - 2 VIEW COMPARISON:  None. FINDINGS: The heart size and mediastinal contours are within normal limits. Mild left basilar opacity. The visualized skeletal structures are unremarkable. Neurostimulator leads noted. IMPRESSION: Mild left basilar opacity, likely due to atelectasis. Electronically Signed   By: Yetta Glassman M.D.   On: 03/17/2021 13:04    Procedures Procedures   Medications Ordered in ED Medications  iohexol (OMNIPAQUE) 350 MG/ML injection 75 mL (75 mLs Intravenous Contrast Given 03/17/21 1535)    ED Course  I have reviewed the triage vital signs and the nursing notes.  Pertinent labs & imaging results that were available during my care of the patient were reviewed by me and considered in my medical decision making (see chart for details).    MDM Rules/Calculators/A&P                          Patient presents with several concerns.  Her primary concern is for possible stroke risk.  She had lancinating headache unilaterally.  Patient has mild hypertension.  No signs of encephalopathy or focal motor deficits.  At this time we will proceed with CT angiogram to rule out dissection or aneurysm.  If the studies are negative I do feel patient is stable from terms of stroke or neurologic dysfunction.  Patient's other concern was for her prior diagnosis of cardiomyopathy and increased swelling in her ankles.  On physical exam no signs of CHF.  Her lungs are clear.  She does not have any murmur.  She has minimal swelling local to the ankles which does not appear consistent with edema.  At this time I do not suspect acute CHF or ACS.  I have counseled the patient to follow-up closely with her physician regarding her prior diagnosis and concerns.  I have advised her to discuss possible follow-up echocardiogram to  further define and monitor for any issues with the prior suggested diagnosis of "enlarged heart".  Today on plain film x-ray no significant cardiomegaly identified and clinically heart and lung exam is normal.  Pending result from CT angiogram, if negative patient is appropriate to follow-up with her PCP and continue her regular medications.  Final Clinical Impression(s) / ED Diagnoses Final diagnoses:  Bad headache  Essential hypertension    Rx / DC Orders ED Discharge Orders     None        Charlesetta Shanks, MD 03/17/21 1616

## 2021-03-17 NOTE — Discharge Instructions (Addendum)
We talked about your work-up in the ER today.  This included CT scan of your brain and neck.  This scan showed that you do have significant plaque buildup in both of your carotid arteries inside your skull.  Our neurologist recommended that you start taking 81 mg of aspirin daily.  We talked about the importance of controlling your diabetes and your high cholesterol, with dietary recommendations as well.  Our neurologist recommended that you follow-up with your own primary care provider for this issue.  Your provider may consider repeat CT angiogram or MR imaging of your brain in 12 months.  We talked about the importance of calling abnormal and return to the ER immediately if develop signs or symptoms of stroke.  This may include facial droop, difficulty speaking, numbness or weakness in your hands or feet.  I printed you a copy of your blood test and your CT report to bring with you to your next PCP meeting.  Please call today or tomorrow to schedule a follow-up appointment for your ER visit.

## 2021-03-17 NOTE — ED Provider Notes (Signed)
I assumed care of this patient from the prior provider.  Briefly this was a 50 year old with a history of insulin-dependent diabetes, fibromyalgia, high cholesterol, presented to ED with multiple complaints, which included chest pain and swelling in her ankles and sharp pains in the left side of her chest that she felt radiated up into the back of her head.  Overall symptoms have improved significantly since her arrival in the ED.  She denied any headache to me.  She denies any active visual deficits, numbness or weakness of the face or extremities.  She did have negative troponins and an unremarkable EKG, chest x-ray with no focal findings, and overall lower suspicion for aortic dissection, PE, or ACS at this time.  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  I spoke to her neurologist on the phone about this, who recommended initiating the patient on 81 mg of aspirin, and follow-up with her PCP if she is not having any other active neurological symptoms.  I discussed the diagnosis with the patient at length, discussed the need for return precautions with particular emphasis on stroke symptoms.  We also talked about dietary and lifestyle modifications to improve her blood sugars and her cholesterol levels.  She verbalized understanding of this plan and will be discharged home.   Wyvonnia Dusky, MD 03/17/21 [phone]

## 2021-03-17 NOTE — ED Triage Notes (Signed)
Pt via pov from home with chest pain and ankle swelling. Swelling noticed yesterday and chest pain began this morning. Pt states she has hx of ankle swelling several years ago, was told she had enlarged heart. Pt reports palpitations as well today. Pt alert & oriented, nad noted.

## 2021-03-17 NOTE — ED Notes (Signed)
Pt states glucose may  be high, insulin pump detached earlier in shower and did not put one back on due to possible imaging.

## 2021-03-19 ENCOUNTER — Other Ambulatory Visit: Payer: Self-pay

## 2021-03-19 ENCOUNTER — Emergency Department (HOSPITAL_COMMUNITY): Payer: BC Managed Care – PPO

## 2021-03-19 ENCOUNTER — Emergency Department (HOSPITAL_COMMUNITY)
Admission: EM | Admit: 2021-03-19 | Discharge: 2021-03-20 | Disposition: A | Discharge status: Home / Self Care | Payer: BC Managed Care – PPO | Attending: Emergency Medicine | Admitting: Emergency Medicine

## 2021-03-19 ENCOUNTER — Encounter (HOSPITAL_COMMUNITY): Payer: Self-pay | Admitting: Emergency Medicine

## 2021-03-19 DIAGNOSIS — G4489 Other headache syndrome: Secondary | ICD-10-CM | POA: Diagnosis not present

## 2021-03-19 DIAGNOSIS — R109 Unspecified abdominal pain: Secondary | ICD-10-CM | POA: Diagnosis not present

## 2021-03-19 DIAGNOSIS — Z5321 Procedure and treatment not carried out due to patient leaving prior to being seen by health care provider: Secondary | ICD-10-CM | POA: Insufficient documentation

## 2021-03-19 DIAGNOSIS — R079 Chest pain, unspecified: Secondary | ICD-10-CM | POA: Diagnosis not present

## 2021-03-19 DIAGNOSIS — R112 Nausea with vomiting, unspecified: Secondary | ICD-10-CM | POA: Diagnosis not present

## 2021-03-19 DIAGNOSIS — R42 Dizziness and giddiness: Secondary | ICD-10-CM | POA: Diagnosis not present

## 2021-03-19 DIAGNOSIS — R0789 Other chest pain: Secondary | ICD-10-CM | POA: Diagnosis not present

## 2021-03-19 LAB — CBC WITH DIFFERENTIAL/PLATELET
Abs Immature Granulocytes: 0.03 10*3/uL (ref 0.00–0.07)
Basophils Absolute: 0 10*3/uL (ref 0.0–0.1)
Basophils Relative: 0 %
Eosinophils Absolute: 0 10*3/uL (ref 0.0–0.5)
Eosinophils Relative: 0 %
HCT: 44.1 % (ref 36.0–46.0)
Hemoglobin: 14.7 g/dL (ref 12.0–15.0)
Immature Granulocytes: 0 %
Lymphocytes Relative: 20 %
Lymphs Abs: 2.1 10*3/uL (ref 0.7–4.0)
MCH: 28.9 pg (ref 26.0–34.0)
MCHC: 33.3 g/dL (ref 30.0–36.0)
MCV: 86.8 fL (ref 80.0–100.0)
Monocytes Absolute: 0.4 10*3/uL (ref 0.1–1.0)
Monocytes Relative: 4 %
Neutro Abs: 7.9 10*3/uL — ABNORMAL HIGH (ref 1.7–7.7)
Neutrophils Relative %: 76 %
Platelets: 434 10*3/uL — ABNORMAL HIGH (ref 150–400)
RBC: 5.08 MIL/uL (ref 3.87–5.11)
RDW: 12.8 % (ref 11.5–15.5)
WBC: 10.5 10*3/uL (ref 4.0–10.5)
nRBC: 0 % (ref 0.0–0.2)

## 2021-03-19 LAB — COMPREHENSIVE METABOLIC PANEL
ALT: 14 U/L (ref 0–44)
AST: 20 U/L (ref 15–41)
Albumin: 3.8 g/dL (ref 3.5–5.0)
Alkaline Phosphatase: 99 U/L (ref 38–126)
Anion gap: 14 (ref 5–15)
BUN: 14 mg/dL (ref 6–20)
CO2: 23 mmol/L (ref 22–32)
Calcium: 9.2 mg/dL (ref 8.9–10.3)
Chloride: 102 mmol/L (ref 98–111)
Creatinine, Ser: 1.05 mg/dL — ABNORMAL HIGH (ref 0.44–1.00)
GFR, Estimated: >60 mL/min (ref >60)
Glucose, Bld: 175 mg/dL — ABNORMAL HIGH (ref 70–99)
Potassium: 3.4 mmol/L — ABNORMAL LOW (ref 3.5–5.1)
Sodium: 139 mmol/L (ref 135–145)
Total Bilirubin: 1.1 mg/dL (ref 0.3–1.2)
Total Protein: 8.4 g/dL — ABNORMAL HIGH (ref 6.5–8.1)

## 2021-03-19 LAB — URINALYSIS, ROUTINE W REFLEX MICROSCOPIC
Bilirubin Urine: NEGATIVE
Glucose, UA: 50 mg/dL — AB
Ketones, ur: 20 mg/dL — AB
Leukocytes,Ua: NEGATIVE
Nitrite: NEGATIVE
Protein, ur: 30 mg/dL — AB
Specific Gravity, Urine: 1.016 (ref 1.005–1.030)
pH: 5 (ref 5.0–8.0)

## 2021-03-19 LAB — TROPONIN I (HIGH SENSITIVITY)
Troponin I (High Sensitivity): 11 ng/L (ref <18)
Troponin I (High Sensitivity): 12 ng/L (ref <18)

## 2021-03-19 LAB — LIPASE, BLOOD: Lipase: 23 U/L (ref 11–51)

## 2021-03-19 NOTE — ED Triage Notes (Addendum)
Patient arrived with EMS from home reports emesis and dizziness onset yesterday , patient added brief left side booy numbness with left chest pain today .

## 2021-03-19 NOTE — ED Provider Notes (Addendum)
Emergency Medicine Provider Triage Evaluation Note  Karen Stein , a 50 y.o. female  was evaluated in triage.  Pt complains of nausea, vomiting, dizziness, and left-sided chest pain.  Patient reports that her symptoms started on Thursday after she was started on rosuvastatin.  Patient reports vomiting multiple times over the last 24 hours.  States that she is unable to hold down any p.o. intake.  Patient has not been able to take her medications in the last 24 hours due to her nausea and vomiting.  Complains of pain to her left flank and left side of her chest.  Additionally patient complains of dizziness.  Review of Systems  Positive: Nausea, vomiting, chest pain, left-sided flank pain, dizziness Negative: Facial asymmetry, dysarthria, numbness, hematemesis, coffee-ground emesis, shortness of breath Physical Exam  BP (!) 150/98 (BP Location: Right Arm)   Pulse (!) 112   Temp 99.3 F (37.4 C) (Oral)   Resp 20   LMP 11/13/2015 (Exact Date)   SpO2 99%  Gen:   Awake, no distress   Resp:  Normal effort  MSK:   Moves extremities without difficulty  Other:  Abdomen soft, nondistended, nontender.  No guarding or rebound tenderness.  No dysarthria.  Medical Decision Making  Medically screening exam initiated at 8:16 PM.  Appropriate orders placed.  LANELLE LINDO was informed that the remainder of the evaluation will be completed by another provider, this initial triage assessment does not replace that evaluation, and the importance of remaining in the ED until their evaluation is complete.  Patient reports that she received IV Zofran with EMS prior to arrival.  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  Neurologist was consulted who recommended initiating patient on 81 mg aspirin and follow-up with PCP.   Loni Beckwith, PA-C 03/19/21 2018    Loni Beckwith, PA-C 03/19/21 2019     Pattricia Boss, MD 03/20/21 Karl Bales

## 2021-03-20 NOTE — ED Notes (Signed)
PT left AMA 

## 2021-03-21 ENCOUNTER — Ambulatory Visit
Admission: EM | Admit: 2021-03-21 | Discharge: 2021-03-21 | Discharge status: Against Medical Advice | Payer: BC Managed Care – PPO

## 2021-03-21 ENCOUNTER — Emergency Department (HOSPITAL_COMMUNITY): Payer: BC Managed Care – PPO

## 2021-03-21 ENCOUNTER — Other Ambulatory Visit: Payer: Self-pay

## 2021-03-21 ENCOUNTER — Encounter (HOSPITAL_COMMUNITY): Payer: Self-pay

## 2021-03-21 ENCOUNTER — Emergency Department (HOSPITAL_COMMUNITY)
Admission: EM | Admit: 2021-03-21 | Discharge: 2021-03-21 | Disposition: A | Discharge status: Home / Self Care | Payer: BC Managed Care – PPO | Attending: Emergency Medicine | Admitting: Emergency Medicine

## 2021-03-21 DIAGNOSIS — R079 Chest pain, unspecified: Secondary | ICD-10-CM

## 2021-03-21 DIAGNOSIS — E119 Type 2 diabetes mellitus without complications: Secondary | ICD-10-CM | POA: Diagnosis not present

## 2021-03-21 DIAGNOSIS — R42 Dizziness and giddiness: Secondary | ICD-10-CM | POA: Diagnosis not present

## 2021-03-21 DIAGNOSIS — R112 Nausea with vomiting, unspecified: Secondary | ICD-10-CM | POA: Insufficient documentation

## 2021-03-21 DIAGNOSIS — H918X3 Other specified hearing loss, bilateral: Secondary | ICD-10-CM | POA: Diagnosis not present

## 2021-03-21 DIAGNOSIS — Z7982 Long term (current) use of aspirin: Secondary | ICD-10-CM | POA: Insufficient documentation

## 2021-03-21 DIAGNOSIS — N390 Urinary tract infection, site not specified: Secondary | ICD-10-CM | POA: Diagnosis not present

## 2021-03-21 DIAGNOSIS — Z79899 Other long term (current) drug therapy: Secondary | ICD-10-CM | POA: Insufficient documentation

## 2021-03-21 DIAGNOSIS — Z794 Long term (current) use of insulin: Secondary | ICD-10-CM | POA: Diagnosis not present

## 2021-03-21 DIAGNOSIS — R531 Weakness: Secondary | ICD-10-CM | POA: Diagnosis not present

## 2021-03-21 DIAGNOSIS — R0789 Other chest pain: Secondary | ICD-10-CM | POA: Diagnosis not present

## 2021-03-21 DIAGNOSIS — R9431 Abnormal electrocardiogram [ECG] [EKG]: Secondary | ICD-10-CM | POA: Diagnosis not present

## 2021-03-21 LAB — CBC
HCT: 44.1 % (ref 36.0–46.0)
Hemoglobin: 15.1 g/dL — ABNORMAL HIGH (ref 12.0–15.0)
MCH: 28.9 pg (ref 26.0–34.0)
MCHC: 34.2 g/dL (ref 30.0–36.0)
MCV: 84.5 fL (ref 80.0–100.0)
Platelets: 401 10*3/uL — ABNORMAL HIGH (ref 150–400)
RBC: 5.22 MIL/uL — ABNORMAL HIGH (ref 3.87–5.11)
RDW: 12.2 % (ref 11.5–15.5)
WBC: 10.9 10*3/uL — ABNORMAL HIGH (ref 4.0–10.5)
nRBC: 0 % (ref 0.0–0.2)

## 2021-03-21 LAB — BASIC METABOLIC PANEL
Anion gap: 16 — ABNORMAL HIGH (ref 5–15)
BUN: 13 mg/dL (ref 6–20)
CO2: 21 mmol/L — ABNORMAL LOW (ref 22–32)
Calcium: 8.8 mg/dL — ABNORMAL LOW (ref 8.9–10.3)
Chloride: 95 mmol/L — ABNORMAL LOW (ref 98–111)
Creatinine, Ser: 1 mg/dL (ref 0.44–1.00)
GFR, Estimated: >60 mL/min (ref >60)
Glucose, Bld: 187 mg/dL — ABNORMAL HIGH (ref 70–99)
Potassium: 3.7 mmol/L (ref 3.5–5.1)
Sodium: 132 mmol/L — ABNORMAL LOW (ref 135–145)

## 2021-03-21 LAB — TROPONIN I (HIGH SENSITIVITY)
Troponin I (High Sensitivity): 7 ng/L (ref <18)
Troponin I (High Sensitivity): 12 ng/L (ref <18)

## 2021-03-21 MED ORDER — ONDANSETRON 4 MG PO TBDP: 4.0000 mg | ORAL_TABLET | Freq: Three times a day (TID) | ORAL | 0 refills | Status: DC | PRN

## 2021-03-21 MED ORDER — FENTANYL CITRATE PF 50 MCG/ML IJ SOSY
50.0000 ug | PREFILLED_SYRINGE | Freq: Once | INTRAMUSCULAR | Status: AC
  Administered 2021-03-21: 50 ug via INTRAVENOUS
  Filled 2021-03-21: qty 1

## 2021-03-21 MED ORDER — METOCLOPRAMIDE HCL 5 MG/ML IJ SOLN
10.0000 mg | Freq: Once | INTRAMUSCULAR | Status: AC
  Administered 2021-03-21: 10 mg via INTRAVENOUS
  Filled 2021-03-21: qty 2

## 2021-03-21 MED ORDER — LACTATED RINGERS IV BOLUS
1000.0000 mL | Freq: Once | INTRAVENOUS | Status: AC
  Administered 2021-03-21: 1000 mL via INTRAVENOUS

## 2021-03-21 NOTE — ED Provider Notes (Signed)
[facility] EMERGENCY DEPARTMENT Provider Note   CSN: [id number] Arrival date & time: 03/21/21  1249     History No chief complaint on file.   Karen Stein is a 50 y.o. female.  The history is provided by the patient, the EMS personnel and medical records.  Emesis Severity:  Moderate Duration:  4 days Timing:  Constant Number of daily episodes:  10+ Quality:  Stomach contents Feeding tolerance: none. 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  Neurologist was consulted who recommended initiating patient on 81 mg aspirin and follow-up with PCP.       Past Medical History:  Diagnosis Date   Anemia    Anxiety    Arthritis    Chronic pain    Complication of anesthesia    anesthesia awareness,   DDD (degenerative disc disease)    DDD (degenerative disc disease)    Degenerative disc disease    Degenerative disc disease    Diabetes mellitus    Type 2 insulin resistant   Dysrhythmia    stress and caffeine related   Endometriosis    Fibroids    Fibromyalgia    GERD (gastroesophageal reflux disease)    High cholesterol    Insomnia    Leg cramps    PCOS (polycystic ovarian syndrome)    Scoliosis    Sickle cell trait ([place])     Patient Active Problem List   Diagnosis Date Noted   DKA (diabetic ketoacidoses) 01/26/2019   Esophagitis    Hyperbilirubinemia    AKI (acute kidney injury) ([place])    OSA on CPAP    Lacunar infarction ([place])    HLD (hyperlipidemia)  07/31/2018   Prolonged QT interval 07/31/2018   Chest pain 07/31/2018   Nausea vomiting and diarrhea 07/31/2018   Abdominal pain 07/31/2018   Elevated blood protein 07/31/2018   Diabetes mellitus without complication ([place]) 65/78/4696   Anxiety    Depression 08/25/2017   Major depressive disorder, 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   Degenerative disc disease    Sickle cell trait ([place])    DDD (degenerative disc disease), lumbar     Past Surgical History:  Procedure Laterality Date   BACK SURGERY  2010   BIOPSY BREAST     CESAREAN SECTION     DILATATION & CURETTAGE/HYSTEROSCOPY WITH MYOSURE N/A 01/01/2015   Procedure: DILATATION & CURETTAGE/HYSTEROSCOPY WITH MYOSURE;  Surgeon: Crawford Givens, MD;  Location: [facility];  Service: Gynecology;  Laterality: N/A;   LAPAROSCOPY  2002 or 2003   TOE SURGERY  2008   WISDOM TOOTH EXTRACTION       OB History     Gravida  4   Para  2   Term  1   Preterm  1   AB  2   Living  1      SAB  2   IAB      Ectopic      Multiple      Live Births  Family History  Problem Relation Age of Onset   Diabetes Father    Hypertension Father    Hyperlipidemia Father    Kidney failure Father    Diabetes Paternal Grandfather    Hypertension Paternal Grandfather    Hyperlipidemia Paternal Grandfather    Diabetes Paternal Grandmother    Hypertension Paternal Grandmother    Hyperlipidemia Paternal Grandmother    Alzheimer's disease Maternal Grandmother    Brain cancer Maternal Grandfather     Social History   Tobacco Use   Smoking status: Never   Smokeless tobacco: Never  Vaping Use   Vaping Use: Never used  Substance Use Topics   Alcohol use: No    Alcohol/week: 0.0 standard drinks   Drug use: Yes    Types: Marijuana    Home Medications Prior to Admission medications   Medication Sig Start Date End Date Taking? Authorizing Provider  ondansetron (ZOFRAN ODT) 4 MG disintegrating  tablet Take 1 tablet (4 mg total) by mouth every 8 (eight) hours as needed for nausea or vomiting. 03/21/21  Yes Astou Lada, Burnadette Peter, MD  aspirin 81 MG chewable tablet Chew 1 tablet (81 mg total) by mouth daily. 03/17/21 06/15/21  Wyvonnia Dusky, MD  atorvastatin (LIPITOR) 20 MG tablet Take 1 tablet (20 mg total) by mouth daily at 6 PM. Patient not taking: Reported on 12/02/2019 08/01/18   Radene Gunning, NP  ciclopirox [facility]) 8 % solution Apply topically. 01/13/21   [provider]  clindamycin (CLEOCIN) 300 MG capsule Take 1 capsule (300 mg total) by mouth 4 (four) times daily. X 7 days Patient not taking: Reported on 12/02/2019 08/23/19   Palumbo, April, MD  doxycycline (VIBRAMYCIN) 100 MG capsule Take 1 capsule (100 mg total) by mouth 2 (two) times daily. Patient not taking: Reported on 03/17/2021 01/09/21   Tegeler, Gwenyth Allegra, MD  Insulin Aspart, w/Niacinamide, (FIASP) 100 UNIT/ML SOLN Inject 5-16 Units into the skin with breakfast, with lunch, and with evening meal. Per sliding scale 09/11/19   [provider]  insulin glargine (LANTUS) 100 UNIT/ML injection Inject 0.15 mLs (15 Units total) into the skin at bedtime. Patient not taking: Reported on 03/17/2021 01/29/19   Mariel Aloe, MD  lidocaine (LIDODERM) 5 % Place 1 patch onto the skin daily. Remove & Discard patch within 12 hours or as directed by MD Patient not taking: Reported on 12/02/2019 08/23/19   Palumbo, April, MD  methocarbamol (ROBAXIN) 500 MG tablet Take 1 tablet (500 mg total) by mouth every 8 (eight) hours as needed for muscle spasms. 11/10/20   Maudie Flakes, MD  metoCLOPramide (REGLAN) 5 MG tablet Take 1 tablet (5 mg total) by mouth every 6 (six) hours as needed for nausea. 12/02/19   Robinson, [place] N, PA-C  metoprolol tartrate (LOPRESSOR) 25 MG tablet Take 1 tablet (25 mg total) by mouth 2 (two) times daily. Patient not taking: Reported on 01/17/2019 08/01/18   Radene Gunning, NP  ondansetron (ZOFRAN ODT) 4 MG  disintegrating tablet Take 1 tablet (4 mg total) by mouth every 8 (eight) hours as needed for nausea or vomiting. 01/09/21   Tegeler, Gwenyth Allegra, MD  Oxycodone HCl 10 MG TABS Take 10 mg by mouth every 6 (six) hours. 11/07/19   [provider]  pantoprazole (PROTONIX) 40 MG tablet Take 1 tablet (40 mg total) by mouth daily. Patient not taking: Reported on 12/02/2019 01/30/19   Mariel Aloe, MD  pregabalin (LYRICA) 200 MG capsule Take 200 mg by mouth  3 (three) times daily. 01/27/21   [provider]  rosuvastatin (CRESTOR) 20 MG tablet Take 20 mg by mouth at bedtime. 03/11/21   [provider]  Semaglutide,0.25 or 0.5MG /DOS, (OZEMPIC, 0.25 OR 0.5 MG/DOSE,) 2 MG/1.5ML SOPN Inject 0.4 mLs into the skin once a week. 09/11/19   [provider]    Allergies    Amoxicillin, Macrobid [nitrofurantoin macrocrystal], Shellfish allergy, and Sulfa antibiotics  Review of Systems   Review of Systems  Constitutional:  Negative for chills and fever.  HENT:  Negative for ear pain and sore throat.   Eyes:  Negative for pain and visual disturbance.  Respiratory:  Negative for cough and shortness of breath.   Cardiovascular:  Positive for chest pain. Negative for palpitations.  Gastrointestinal:  Positive for nausea and vomiting. Negative for abdominal pain and diarrhea.  Genitourinary:  Negative for dysuria and hematuria.  Musculoskeletal:  Positive for myalgias. Negative for arthralgias and back pain.  Skin:  Negative for color change and rash.  Neurological:  Positive for numbness. Negative for seizures and syncope.  All other systems reviewed and are negative.  Physical Exam Updated Vital Signs BP (!) 161/70   Pulse 77   Temp 98.4 F (36.9 C)   Resp 17   LMP 11/13/2015 (Exact Date)   SpO2 100%   Physical Exam Vitals and nursing note reviewed.  Constitutional:      General: She is not in acute distress.    Appearance: Normal appearance. She is well-developed.  She is obese.  HENT:     Head: Normocephalic and atraumatic.     Right Ear: External ear normal.     Left Ear: External ear normal.     Nose: Nose normal. No congestion or rhinorrhea.     Mouth/Throat:     Mouth: Mucous membranes are moist.  Eyes:     Extraocular Movements: Extraocular movements intact.     Conjunctiva/sclera: Conjunctivae normal.     Pupils: Pupils are equal, round, and reactive to light.  Cardiovascular:     Rate and Rhythm: Normal rate and regular rhythm.     Pulses: Normal pulses.     Heart sounds: No murmur heard. Pulmonary:     Effort: Pulmonary effort is normal. No respiratory distress.     Breath sounds: Normal breath sounds. No wheezing, rhonchi or rales.  Abdominal:     General: Abdomen is flat. Bowel sounds are normal.     Palpations: Abdomen is soft.     Tenderness: There is no abdominal tenderness. There is no right CVA tenderness, left CVA tenderness, guarding or rebound.  Musculoskeletal:        General: No swelling, tenderness or deformity.     Cervical back: Normal range of motion and neck supple. No rigidity.  Skin:    General: Skin is warm and dry.     Capillary Refill: Capillary refill takes less than 2 seconds.  Neurological:     General: No focal deficit present.     Mental Status: She is alert and oriented to person, place, and time.     Cranial Nerves: No cranial nerve deficit.     Sensory: No sensory deficit.     Motor: Weakness (symmetric, generalized) present.     Coordination: Coordination normal.     Gait: Gait normal.  Psychiatric:        Mood and Affect: Mood normal.    ED Results / Procedures / Treatments   Labs (all labs ordered are listed,  but only abnormal results are displayed) Labs Reviewed  BASIC METABOLIC PANEL - Abnormal; Notable for the following components:      Result Value   Sodium 132 (*)    Chloride 95 (*)    CO2 21 (*)    Glucose, Bld 187 (*)    Calcium 8.8 (*)    Anion gap 16 (*)    All other  components within normal limits  CBC - Abnormal; Notable for the following components:   WBC 10.9 (*)    RBC 5.22 (*)    Hemoglobin 15.1 (*)    Platelets 401 (*)    All other components within normal limits  URINALYSIS, ROUTINE W REFLEX MICROSCOPIC  LIPASE, BLOOD  TROPONIN I (HIGH SENSITIVITY)  TROPONIN I (HIGH SENSITIVITY)    EKG EKG Interpretation  Date/Time:  Monday March 21 2021 12:53:49 EST Ventricular Rate:  85 PR Interval:  144 QRS Duration: 82 QT Interval:  384 QTC Calculation: 456 R Axis:   79 Text Interpretation: Normal sinus rhythm new ST & T wave abnormality, consider inferior ischemia Confirmed by Blanchie Dessert [phone]) on 03/21/2021 5:01:39 PM  Radiology DG Chest 1 View  Result Date: 03/19/2021 CLINICAL DATA:  Chest pain EXAM: CHEST  1 VIEW COMPARISON:  Chest x-ray 03/17/2021, CT chest 07/31/2018 FINDINGS: The heart and mediastinal contours are within normal limits. No focal consolidation. No pulmonary edema. No pleural effusion. No pneumothorax. No acute osseous abnormality. Leads noted overlying the thoracic spine. IMPRESSION: No active disease. Electronically Signed   By: Iven Finn M.D.   On: 03/19/2021 21:42   DG Chest 2 View  Result Date: 03/21/2021 CLINICAL DATA:  A 50 year old female presents with chest pain. EXAM: CHEST - 2 VIEW COMPARISON:  Comparison is made with March 19, 2021. FINDINGS: Cardiomediastinal contours and hilar structures are stable, heart size accentuated by AP projection. No lobar consolidative process. No substantial pleural effusion. No visible pneumothorax. Most inferior aspect of costodiaphragmatic sulci on lateral view, excluded on today's study. Signs of spinal nerve stimulators. No acute musculoskeletal process to the extent evaluated. IMPRESSION: No acute cardiopulmonary process. Electronically Signed   By: Zetta Bills M.D.   On: 03/21/2021 13:54    Procedures Procedures   Medications Ordered in ED Medications   lactated ringers bolus 1,000 mL (1,000 mLs Intravenous New Bag/Given 03/21/21 1715)  metoCLOPramide (REGLAN) injection 10 mg (10 mg Intravenous Given 03/21/21 1654)  fentaNYL (SUBLIMAZE) injection 50 mcg (50 mcg Intravenous Given 03/21/21 1715)    ED Course  I have reviewed the triage vital signs and the nursing notes.  Pertinent labs & imaging results that were available during my care of the patient were reviewed by me and considered in my medical decision making (see chart for details).    MDM Rules/Calculators/A&P                           50 y/o female w/ multiple complaints as above.  EKG sinus rhythm w/ questionable ST changes laterally. Repeat EKG showed no ischemic changes. Likely lead reversal on initial EKG. Trops nml, pain not typical, low concern for acs.  N/v possibly related to gastroparesis vs statin intolerance. Trialed fluids, compazine, analgesia.   AG mildly elevated, likely related to ketosis in the setting of poor PO. Fluids as above.  Bicarb nml, glucose mildly elevated, low concern for dka.   Suspect pain related to fibromyalgia in the setting of poor PO and unable to hold down  home lyrica and oxycodone.   On reassessment, resting comfortably. Currently pain and nausea controlled. Remains HDS. Tolerating PO w/out difficulty. Appropriate for dc home w/ close pcp f/u for reassessment. Encouraged rest, hydration. Strict return precautions provided. Zofran rx provided for sxs control.    Final Clinical Impression(s) / ED Diagnoses Final diagnoses:  Nausea and vomiting, unspecified vomiting type  Chest pain, unspecified type    Rx / DC Orders ED Discharge Orders          Ordered    ondansetron (ZOFRAN ODT) 4 MG disintegrating tablet  Every 8 hours PRN        03/21/21 1900             Idamae Lusher, MD 03/21/21 Pauline Aus    Blanchie Dessert, MD 03/22/21 2248

## 2021-03-21 NOTE — ED Provider Notes (Signed)
Emergency Medicine Provider Triage Evaluation Note  Karen Stein , a 50 y.o. female  was evaluated in triage.  Pt complains of multiple complaints.  Primarily, patient is having weakness to the left side.  She is also complaining of chest tightness, nausea and vomiting.  Reports she has not been able to keep down any food since Wednesday of last week.  Reports she was seen at [facility], CTA was done and saw some bilateral carotid artery stenosis.  Advised to follow-up with her primary care doctor outpatient, patient states her primary care doctor told her to come to the ED to be admitted, unclear why..  Review of Systems  Positive: Chest pain, left-sided weakness, nausea, vomiting Negative: Fever  Physical Exam  BP (!) 156/88 (BP Location: Left Arm)   Pulse 88   Temp 98.4 F (36.9 C)   Resp 17   LMP 11/13/2015 (Exact Date)   SpO2 100%  Gen:   Awake, no distress   Resp:  Normal effort  MSK:   Moves extremities without difficulty  Other:  Cranial nerves III through XII are grossly intact  Medical Decision Making  Medically screening exam initiated at 1:13 PM.  Appropriate orders placed.  Karen Stein was informed that the remainder of the evaluation will be completed by another provider, this initial triage assessment does not replace that evaluation, and the importance of remaining in the ED until their evaluation is complete.  Chart review performed, will start with basic chest pain labs.  Outside stroke window, no neurodeficits on exam.  CTA was done 1117/22 Will obtain from ordering additional imaging at this time.   Sherrill Raring, PA-C 03/21/21 1315    Daleen Bo, MD 03/21/21 [phone]

## 2021-03-21 NOTE — Discharge Instructions (Addendum)
1) Please follow up with your PCP in 3 days for reassessment.  2) Rest and stay hydrated. You have been prescribed Zofran to help with residual nausea and vomiting.  3) Return to the ED w/ worsening vomiting unrelieved by zofran, chest pain, headaches, fevers.

## 2021-03-21 NOTE — ED Triage Notes (Signed)
Pt states seen in the ED last wk and was sent home. Pt states called her PCP to have her admitted and he sent her here. Pt c/o nausea and vomiting since Friday after starting cholesterol meds. Pt c/o numbness and weakness since last Wednesday. No facial droop noted, neg for stroke. Pt crying wanting [place] to admit her. Pt states unable to wait and left to go to the ED.

## 2021-03-21 NOTE — ED Triage Notes (Signed)
Patient complains of numbness to bilateral arms and legs with nausea and some occasional vomiting x 1 week. States that she was told she has blockages in her brain after being seen at [facility] last week. Patient alert and oriented

## 2021-03-21 NOTE — ED Provider Notes (Incomplete)
Patient is a 50 year old female with a history of fibromyalgia, diabetes, GERD, recent diagnosis of calcifications in a CTA of her neck who had been started on a statin medication 4 days ago that caused her to vomit.  She unfortunately has continued vomiting despite discontinuing the medication and has not been able to hold down her Lyrica and oxycodone she takes for her fibromyalgia.  The vomiting has been persistent.  She has pain diffusely all over as well as ongoing nausea.  Patient also has a history of gastroparesis which could also potentially be flared.  She denies any infectious etiology at this time.  Labs are consistent with dehydration with mild anion gap of 16.  EKG does show some ST changes laterally but negative troponins.  We will repeat EKG but patient is not complaining of symptoms consistent with ACS at this time.  Chest x-ray is within normal limits.  Feel that patient is having withdrawal from her regular medications because she has been unable to tolerate them as well as being dehydrated.  We will give IV fluids and symptom control.

## 2021-03-25 DIAGNOSIS — E119 Type 2 diabetes mellitus without complications: Secondary | ICD-10-CM | POA: Diagnosis not present

## 2021-03-29 DIAGNOSIS — M545 Low back pain, unspecified: Secondary | ICD-10-CM | POA: Diagnosis not present

## 2021-03-29 DIAGNOSIS — M5412 Radiculopathy, cervical region: Secondary | ICD-10-CM | POA: Diagnosis not present

## 2021-03-29 DIAGNOSIS — M797 Fibromyalgia: Secondary | ICD-10-CM | POA: Diagnosis not present

## 2021-03-29 DIAGNOSIS — Z79899 Other long term (current) drug therapy: Secondary | ICD-10-CM | POA: Diagnosis not present

## 2021-03-29 DIAGNOSIS — M5417 Radiculopathy, lumbosacral region: Secondary | ICD-10-CM | POA: Diagnosis not present

## 2021-03-29 DIAGNOSIS — M542 Cervicalgia: Secondary | ICD-10-CM | POA: Diagnosis not present

## 2021-03-31 DIAGNOSIS — M797 Fibromyalgia: Secondary | ICD-10-CM | POA: Diagnosis not present

## 2021-04-01 DIAGNOSIS — M797 Fibromyalgia: Secondary | ICD-10-CM | POA: Diagnosis not present

## 2021-04-05 DIAGNOSIS — M797 Fibromyalgia: Secondary | ICD-10-CM | POA: Diagnosis not present

## 2021-04-07 DIAGNOSIS — M797 Fibromyalgia: Secondary | ICD-10-CM | POA: Diagnosis not present

## 2021-04-11 ENCOUNTER — Ambulatory Visit (INDEPENDENT_AMBULATORY_CARE_PROVIDER_SITE_OTHER): Payer: BC Managed Care – PPO | Admitting: Vascular Surgery

## 2021-04-11 ENCOUNTER — Other Ambulatory Visit: Payer: Self-pay | Admitting: *Deleted

## 2021-04-11 ENCOUNTER — Other Ambulatory Visit: Payer: Self-pay

## 2021-04-11 ENCOUNTER — Encounter: Payer: Self-pay | Admitting: Vascular Surgery

## 2021-04-11 VITALS — BP 116/73 | HR 96 | Temp 98.0°F | Resp 16 | Ht 68.0 in | Wt 222.0 lb

## 2021-04-11 DIAGNOSIS — I6523 Occlusion and stenosis of bilateral carotid arteries: Secondary | ICD-10-CM

## 2021-04-11 MED ORDER — CLOPIDOGREL BISULFATE 75 MG PO TABS: 75.0000 mg | ORAL_TABLET | Freq: Every day | ORAL | 6 refills | Status: DC

## 2021-04-11 NOTE — Progress Notes (Signed)
Office Note     CC: Carotid artery stenosis Requesting Provider:  [name], MD  HPI: Karen Stein is a 50 y.o. ([birth year]) female presenting at the request of .Karen Mccreedy, MD after recent CT head demonstrated intracranial internal carotid artery stenosis.  Karen Stein has an extensive medical history including chronic back pain with multiple surgeries, diabetes, obesity, fibromyalgia, high cholesterol.  She was recently seen in the ED with sudden severe headache, sharp pain on the left side of her head.  The work-up was negative except for the intracranial internal carotid artery lesions.   On exam today, Karen Stein was doing well.  She denied recent headache, dizziness, TIA, stroke.  Over the last several months, she has noted trouble with short-term memory.  This has been a major concern for her daughter.  Karen Stein has a family history of stroke which has affected multiple family members at a young age.  The pt is  on a statin for cholesterol management.  The pt is  on a daily aspirin.   Other AC:  - The pt is  on medication for hypertension.   The pt is  diabetic.  Tobacco hx:  -  Past Medical History:  Diagnosis Date   Anemia    Anxiety    Arthritis    Chronic pain    Complication of anesthesia    anesthesia awareness,   DDD (degenerative disc disease)    DDD (degenerative disc disease)    Degenerative disc disease    Degenerative disc disease    Diabetes mellitus    Type 2 insulin resistant   Dysrhythmia    stress and caffeine related   Endometriosis    Fibroids    Fibromyalgia    GERD (gastroesophageal reflux disease)    High cholesterol    Insomnia    Leg cramps    PCOS (polycystic ovarian syndrome)    Scoliosis    Sickle cell trait ([place])     Past Surgical History:  Procedure Laterality Date   BACK SURGERY  2010   BIOPSY BREAST     CESAREAN SECTION     DILATATION & CURETTAGE/HYSTEROSCOPY WITH MYOSURE N/A 01/01/2015   Procedure: DILATATION &  CURETTAGE/HYSTEROSCOPY WITH MYOSURE;  Surgeon: Crawford Givens, MD;  Location: [facility];  Service: Gynecology;  Laterality: N/A;   LAPAROSCOPY  2002 or 2003   TOE SURGERY  2008   WISDOM TOOTH EXTRACTION      Social History   Socioeconomic History   Marital status: Married    Spouse name: Not on file   Number of children: Not on file   Years of education: Not on file   Highest education level: Not on file  Occupational History   Not on file  Tobacco Use   Smoking status: Never   Smokeless tobacco: Never  Vaping Use   Vaping Use: Never used  Substance and Sexual Activity   Alcohol use: No    Alcohol/week: 0.0 standard drinks   Drug use: Yes    Types: Marijuana   Sexual activity: Not on file  Other Topics Concern   Not on file  Social History Narrative   Not on file   Social Determinants of Health   Financial Resource Strain: Not on file  Food Insecurity: Not on file  Transportation Needs: Not on file  Physical Activity: Not on file  Stress: Not on file  Social Connections: Not on file  Intimate Partner Violence: Not on file    Family History  Problem  Relation Age of Onset   Diabetes Father    Hypertension Father    Hyperlipidemia Father    Kidney failure Father    Diabetes Paternal Grandfather    Hypertension Paternal Grandfather    Hyperlipidemia Paternal Grandfather    Diabetes Paternal Grandmother    Hypertension Paternal Grandmother    Hyperlipidemia Paternal Grandmother    Alzheimer's disease Maternal Grandmother    Brain cancer Maternal Grandfather     Current Outpatient Medications  Medication Sig Dispense Refill   aspirin 81 MG chewable tablet Chew 1 tablet (81 mg total) by mouth daily. 90 tablet 2   atorvastatin (LIPITOR) 20 MG tablet Take 1 tablet (20 mg total) by mouth daily at 6 PM. (Patient not taking: Reported on 12/02/2019) 30 tablet 1   ciclopirox (PENLAC) 8 % solution Apply topically.     clindamycin (CLEOCIN) 300 MG capsule Take 1 capsule (300  mg total) by mouth 4 (four) times daily. X 7 days (Patient not taking: Reported on 12/02/2019) 28 capsule 0   doxycycline (VIBRAMYCIN) 100 MG capsule Take 1 capsule (100 mg total) by mouth 2 (two) times daily. (Patient not taking: Reported on 03/17/2021) 20 capsule 0   Insulin Aspart, w/Niacinamide, (FIASP) 100 UNIT/ML SOLN Inject 5-16 Units into the skin with breakfast, with lunch, and with evening meal. Per sliding scale     insulin glargine (LANTUS) 100 UNIT/ML injection Inject 0.15 mLs (15 Units total) into the skin at bedtime. (Patient not taking: Reported on 03/17/2021) 10 mL 0   lidocaine (LIDODERM) 5 % Place 1 patch onto the skin daily. Remove & Discard patch within 12 hours or as directed by MD (Patient not taking: Reported on 12/02/2019) 30 patch 0   methocarbamol (ROBAXIN) 500 MG tablet Take 1 tablet (500 mg total) by mouth every 8 (eight) hours as needed for muscle spasms. 30 tablet 0   metoCLOPramide (REGLAN) 5 MG tablet Take 1 tablet (5 mg total) by mouth every 6 (six) hours as needed for nausea. 15 tablet 0   metoprolol tartrate (LOPRESSOR) 25 MG tablet Take 1 tablet (25 mg total) by mouth 2 (two) times daily. (Patient not taking: Reported on 01/17/2019) 60 tablet 1   ondansetron (ZOFRAN ODT) 4 MG disintegrating tablet Take 1 tablet (4 mg total) by mouth every 8 (eight) hours as needed for nausea or vomiting. 14 tablet 0   ondansetron (ZOFRAN ODT) 4 MG disintegrating tablet Take 1 tablet (4 mg total) by mouth every 8 (eight) hours as needed for nausea or vomiting. 20 tablet 0   Oxycodone HCl 10 MG TABS Take 10 mg by mouth every 6 (six) hours.     pantoprazole (PROTONIX) 40 MG tablet Take 1 tablet (40 mg total) by mouth daily. (Patient not taking: Reported on 12/02/2019) 30 tablet 0   pregabalin (LYRICA) 200 MG capsule Take 200 mg by mouth 3 (three) times daily.     rosuvastatin (CRESTOR) 20 MG tablet Take 20 mg by mouth at bedtime.     Semaglutide,0.25 or 0.5MG /DOS, (OZEMPIC, 0.25 OR 0.5  MG/DOSE,) 2 MG/1.5ML SOPN Inject 0.4 mLs into the skin once a week.     No current facility-administered medications for this visit.    Allergies  Allergen Reactions   Amoxicillin    Macrobid [Nitrofurantoin Macrocrystal] Nausea Only    Headache and "severe abd cramps"   Shellfish Allergy Swelling    Itching of lips and ears, NOT allergic to iodine contrast   Sulfa Antibiotics Other (See Comments)  Dizzy and sees spots with sulfa drugs     REVIEW OF SYSTEMS:   [X]  denotes positive finding, [ ]  denotes negative finding Cardiac  Comments:  Chest pain or chest pressure:    Shortness of breath upon exertion:    Short of breath when lying flat:    Irregular heart rhythm:        Vascular    Pain in calf, thigh, or hip brought on by ambulation:    Pain in feet at night that wakes you up from your sleep:     Blood clot in your veins:    Leg swelling:         Pulmonary    Oxygen at home:    Productive cough:     Wheezing:         Neurologic    Sudden weakness in arms or legs:     Sudden numbness in arms or legs:     Sudden onset of difficulty speaking or slurred speech:    Temporary loss of vision in one eye:     Problems with dizziness:         Gastrointestinal    Blood in stool:     Vomited blood:         Genitourinary    Burning when urinating:     Blood in urine:        Psychiatric    Major depression:         Hematologic    Bleeding problems:    Problems with blood clotting too easily:        Skin    Rashes or ulcers:        Constitutional    Fever or chills:      PHYSICAL EXAMINATION:  There were no vitals filed for this visit.  General:  WDWN in NAD; vital signs documented above Gait: Not observed HENT: WNL, normocephalic Pulmonary: normal non-labored breathing , without wheezing Cardiac: regular HR, Abdomen: soft, NT, no masses Skin: without rashes Vascular Exam/Pulses:  Right Left  Radial 2+ (normal) 2+ (normal)  Ulnar 2+ (normal) 2+  (normal)  Femoral    Popliteal    DP 2+ (normal) 2+ (normal)  PT 2+ (normal) 2+ (normal)   Extremities: without ischemic changes, without Gangrene , without cellulitis; without open wounds;  Musculoskeletal: no muscle wasting or atrophy  Neurologic: A&O X 3;  No focal weakness or paresthesias are detected Psychiatric:  The pt has Normal affect.   Non-Invasive Vascular Imaging:   Patient underwent CT angio head neck on 03/17/2021.  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  The patient has no gross motor abnormalities or findings in her history suggestive of recent TIA or stroke.  She is concerned about short-term memory loss.  I had a long conversation with Karen Stein about the location of the lesion as well as the risks of internal carotid artery stenosis.  The lesion is located at the sella turcica which is intracranial.  This would be best managed by neurosurgery.  In an effort to decrease her risk profile, I started the patient on dual antiplatelet therapy.  I asked her to continue her high intensity statin-Lipitor. I also ordered a bilateral carotid duplex ultrasound to define velocities within the extracranial internal carotid artery.  I will refer to neurosurgery for further work-up and treatment   Broadus John, MD [facility] [phone]

## 2021-04-12 DIAGNOSIS — M797 Fibromyalgia: Secondary | ICD-10-CM | POA: Diagnosis not present

## 2021-04-14 DIAGNOSIS — R300 Dysuria: Secondary | ICD-10-CM | POA: Diagnosis not present

## 2021-04-14 DIAGNOSIS — I672 Cerebral atherosclerosis: Secondary | ICD-10-CM | POA: Diagnosis not present

## 2021-04-14 DIAGNOSIS — Z6834 Body mass index (BMI) 34.0-34.9, adult: Secondary | ICD-10-CM | POA: Diagnosis not present

## 2021-04-14 DIAGNOSIS — M797 Fibromyalgia: Secondary | ICD-10-CM | POA: Diagnosis not present

## 2021-04-18 ENCOUNTER — Other Ambulatory Visit: Payer: Self-pay

## 2021-04-18 ENCOUNTER — Ambulatory Visit (HOSPITAL_COMMUNITY)
Admission: RE | Admit: 2021-04-18 | Discharge: 2021-04-18 | Disposition: A | Discharge status: Home / Self Care | Payer: BC Managed Care – PPO | Source: Ambulatory Visit | Attending: Vascular Surgery | Admitting: Vascular Surgery

## 2021-04-18 DIAGNOSIS — I6523 Occlusion and stenosis of bilateral carotid arteries: Secondary | ICD-10-CM | POA: Diagnosis not present

## 2021-04-19 DIAGNOSIS — R4789 Other speech disturbances: Secondary | ICD-10-CM | POA: Diagnosis not present

## 2021-04-19 DIAGNOSIS — I679 Cerebrovascular disease, unspecified: Secondary | ICD-10-CM | POA: Diagnosis not present

## 2021-04-20 ENCOUNTER — Other Ambulatory Visit: Payer: Self-pay | Admitting: Neurosurgery

## 2021-04-20 DIAGNOSIS — I679 Cerebrovascular disease, unspecified: Secondary | ICD-10-CM

## 2021-04-20 DIAGNOSIS — R4789 Other speech disturbances: Secondary | ICD-10-CM

## 2021-05-05 ENCOUNTER — Other Ambulatory Visit (HOSPITAL_COMMUNITY): Payer: Self-pay | Admitting: Neurosurgery

## 2021-05-05 DIAGNOSIS — R4789 Other speech disturbances: Secondary | ICD-10-CM

## 2021-05-05 DIAGNOSIS — I679 Cerebrovascular disease, unspecified: Secondary | ICD-10-CM

## 2021-05-09 DIAGNOSIS — N390 Urinary tract infection, site not specified: Secondary | ICD-10-CM | POA: Diagnosis not present

## 2021-05-09 DIAGNOSIS — N952 Postmenopausal atrophic vaginitis: Secondary | ICD-10-CM | POA: Diagnosis not present

## 2021-05-10 DIAGNOSIS — M797 Fibromyalgia: Secondary | ICD-10-CM | POA: Diagnosis not present

## 2021-05-11 DIAGNOSIS — E119 Type 2 diabetes mellitus without complications: Secondary | ICD-10-CM | POA: Diagnosis not present

## 2021-05-11 DIAGNOSIS — R002 Palpitations: Secondary | ICD-10-CM | POA: Diagnosis not present

## 2021-05-11 DIAGNOSIS — Z794 Long term (current) use of insulin: Secondary | ICD-10-CM | POA: Diagnosis not present

## 2021-05-11 DIAGNOSIS — Z23 Encounter for immunization: Secondary | ICD-10-CM | POA: Diagnosis not present

## 2021-05-11 DIAGNOSIS — E785 Hyperlipidemia, unspecified: Secondary | ICD-10-CM | POA: Diagnosis not present

## 2021-05-11 DIAGNOSIS — I6523 Occlusion and stenosis of bilateral carotid arteries: Secondary | ICD-10-CM | POA: Diagnosis not present

## 2021-05-11 DIAGNOSIS — N390 Urinary tract infection, site not specified: Secondary | ICD-10-CM | POA: Diagnosis not present

## 2021-05-12 DIAGNOSIS — N390 Urinary tract infection, site not specified: Secondary | ICD-10-CM | POA: Diagnosis not present

## 2021-05-17 DIAGNOSIS — M5459 Other low back pain: Secondary | ICD-10-CM | POA: Diagnosis not present

## 2021-05-17 DIAGNOSIS — B353 Tinea pedis: Secondary | ICD-10-CM | POA: Diagnosis not present

## 2021-05-17 DIAGNOSIS — M2042 Other hammer toe(s) (acquired), left foot: Secondary | ICD-10-CM | POA: Diagnosis not present

## 2021-05-17 DIAGNOSIS — M2041 Other hammer toe(s) (acquired), right foot: Secondary | ICD-10-CM | POA: Diagnosis not present

## 2021-05-17 DIAGNOSIS — M797 Fibromyalgia: Secondary | ICD-10-CM | POA: Diagnosis not present

## 2021-05-17 DIAGNOSIS — M542 Cervicalgia: Secondary | ICD-10-CM | POA: Diagnosis not present

## 2021-05-17 DIAGNOSIS — B351 Tinea unguium: Secondary | ICD-10-CM | POA: Diagnosis not present

## 2021-05-25 DIAGNOSIS — R002 Palpitations: Secondary | ICD-10-CM | POA: Insufficient documentation

## 2021-05-25 NOTE — Progress Notes (Signed)
Patient referred by Karen Mccreedy, MD for palpitations, irregular heart beat  Subjective:   Karen Stein, female    DOB: 07-01-1970, 51 y.o.   MRN: [id number]   Chief Complaint  Patient presents with   Palpitations   Irregular Heart Beat     HPI  51 y.o. African-American female with type 2 diabetes mellitus, mixed hyperlipidemia, fibromyalgia, chronic pain, referred for evaluation of palpitations.  Palpitations are infrequent, no >1/6 months.  Episodes last for few minutes, no associated significant chest pain or shortness of breath.  Patient has been on medical leave since 2019 due to her chronic pain and fibromyalgia.  She is currently enrolled in a study at [facility] for the same.  She recently was seen by vascular surgery and neurosurgery for intracranial atherosclerosis bilateral intracranial ICA atherosclerotic disease.  Patient attributes her word finding difficulty and memory lapses to the above.  She was recommended no further treatment by neurosurgery here in [place].  Now she is seeing a Publishing rights manager at [facility].   Past Medical History:  Diagnosis Date   Anemia    Anxiety    Arthritis    Chronic pain    Complication of anesthesia    anesthesia awareness,   DDD (degenerative disc disease)    DDD (degenerative disc disease)    Degenerative disc disease    Degenerative disc disease    Diabetes mellitus    Type 2 insulin resistant   Dysrhythmia    stress and caffeine related   Endometriosis    Fibroids    Fibromyalgia    GERD (gastroesophageal reflux disease)    High cholesterol    Insomnia    Leg cramps    PCOS (polycystic ovarian syndrome)    Scoliosis    Sickle cell trait ([place])      Past Surgical History:  Procedure Laterality Date   BACK SURGERY  2010   BIOPSY BREAST     CESAREAN SECTION     DILATATION & CURETTAGE/HYSTEROSCOPY WITH MYOSURE N/A 01/01/2015   Procedure: DILATATION & CURETTAGE/HYSTEROSCOPY WITH MYOSURE;  Surgeon: Crawford Givens,  MD;  Location: [facility];  Service: Gynecology;  Laterality: N/A;   LAPAROSCOPY  2002 or 2003   TOE SURGERY  2008   WISDOM TOOTH EXTRACTION       Social History   Tobacco Use  Smoking Status Never  Smokeless Tobacco Never    Social History   Substance and Sexual Activity  Alcohol Use No   Alcohol/week: 0.0 standard drinks     Family History  Problem Relation Age of Onset   Diabetes Father    Hypertension Father    Hyperlipidemia Father    Kidney failure Father    Diabetes Paternal Grandfather    Hypertension Paternal Grandfather    Hyperlipidemia Paternal Grandfather    Diabetes Paternal Grandmother    Hypertension Paternal Grandmother    Hyperlipidemia Paternal Grandmother    Alzheimer's disease Maternal Grandmother    Brain cancer Maternal Grandfather      Current Outpatient Medications on File Prior to Visit  Medication Sig Dispense Refill   aspirin 81 MG chewable tablet Chew 1 tablet (81 mg total) by mouth daily. 90 tablet 2   atorvastatin (LIPITOR) 20 MG tablet Take 1 tablet (20 mg total) by mouth daily at 6 PM. (Patient not taking: Reported on 12/02/2019) 30 tablet 1   ciclopirox (PENLAC) 8 % solution Apply topically.     clindamycin (CLEOCIN) 300 MG capsule Take 1 capsule (300 mg  total) by mouth 4 (four) times daily. X 7 days (Patient not taking: Reported on 12/02/2019) 28 capsule 0   clopidogrel (PLAVIX) 75 MG tablet Take 1 tablet (75 mg total) by mouth daily. 30 tablet 6   doxycycline (VIBRAMYCIN) 100 MG capsule Take 1 capsule (100 mg total) by mouth 2 (two) times daily. (Patient not taking: Reported on 03/17/2021) 20 capsule 0   Insulin Aspart, w/Niacinamide, (FIASP) 100 UNIT/ML SOLN Inject 5-16 Units into the skin with breakfast, with lunch, and with evening meal. Per sliding scale     insulin glargine (LANTUS) 100 UNIT/ML injection Inject 0.15 mLs (15 Units total) into the skin at bedtime. (Patient not taking: Reported on 03/17/2021) 10 mL 0   lidocaine  (LIDODERM) 5 % Place 1 patch onto the skin daily. Remove & Discard patch within 12 hours or as directed by MD (Patient not taking: Reported on 12/02/2019) 30 patch 0   methocarbamol (ROBAXIN) 500 MG tablet Take 1 tablet (500 mg total) by mouth every 8 (eight) hours as needed for muscle spasms. 30 tablet 0   metoCLOPramide (REGLAN) 5 MG tablet Take 1 tablet (5 mg total) by mouth every 6 (six) hours as needed for nausea. 15 tablet 0   metoprolol tartrate (LOPRESSOR) 25 MG tablet Take 1 tablet (25 mg total) by mouth 2 (two) times daily. (Patient not taking: Reported on 01/17/2019) 60 tablet 1   ondansetron (ZOFRAN ODT) 4 MG disintegrating tablet Take 1 tablet (4 mg total) by mouth every 8 (eight) hours as needed for nausea or vomiting. 14 tablet 0   ondansetron (ZOFRAN ODT) 4 MG disintegrating tablet Take 1 tablet (4 mg total) by mouth every 8 (eight) hours as needed for nausea or vomiting. 20 tablet 0   Oxycodone HCl 10 MG TABS Take 10 mg by mouth every 6 (six) hours.     pantoprazole (PROTONIX) 40 MG tablet Take 1 tablet (40 mg total) by mouth daily. (Patient not taking: Reported on 12/02/2019) 30 tablet 0   pregabalin (LYRICA) 200 MG capsule Take 200 mg by mouth 3 (three) times daily.     rosuvastatin (CRESTOR) 20 MG tablet Take 20 mg by mouth at bedtime. (Patient not taking: Reported on 04/11/2021)     Semaglutide,0.25 or 0.5MG/DOS, (OZEMPIC, 0.25 OR 0.5 MG/DOSE,) 2 MG/1.5ML SOPN Inject 0.4 mLs into the skin once a week.     No current facility-administered medications on file prior to visit.    Cardiovascular and other pertinent studies:  EKG 05/26/2021: Sinus rhythm 84 bpm Low voltage in precordial leads  Old anteroseptal infarct   Carotid duplex 03/2021: Right Carotid: Velocities in the right ICA are consistent with a 1-39%  stenosis.  Left Carotid: Velocities in the left ICA are consistent with a 1-39%  stenosis.  Vertebrals:  Bilateral vertebral arteries demonstrate antegrade flow.   Subclavians: Normal flow hemodynamics were seen in bilateral subclavian               arteries.   MRI brain 07/30/2020: 1. No acute intracranial abnormality. 2. 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 3. Otherwise normal brain MRI.   Recent labs: 05/12/2021: Glucose 100, BUN/Cr 11/0.74. EGFR 97. Na/K 140/4.5. Rest of the CMP normal H/H 13.1/39.6. MCV 87.6. Platelets 337 Chol 206, TG 99, HDL 42, LDL 145   Review of Systems  Cardiovascular:  Positive for palpitations. Negative for chest pain, dyspnea on exertion, leg swelling and syncope.  Neurological:        As per  HPI       Vitals:   05/26/21 0926  BP: 125/69  Pulse: 84  Resp: 16  Temp: 98 F (36.7 C)  SpO2: 96%     Body mass index is 36.34 kg/m. Filed Weights   05/26/21 0926  Weight: 239 lb (108.4 kg)     Objective:   Physical Exam Vitals and nursing note reviewed.  Constitutional:      General: She is not in acute distress. Neck:     Vascular: No JVD.  Cardiovascular:     Rate and Rhythm: Normal rate and regular rhythm.     Heart sounds: Normal heart sounds. No murmur heard. Pulmonary:     Effort: Pulmonary effort is normal.     Breath sounds: Normal breath sounds. No wheezing or rales.  Musculoskeletal:     Right lower leg: No edema.     Left lower leg: No edema.       Assessment & Recommendations:   51 y.o. African-American female with type 2 diabetes mellitus, mixed hyperlipidemia, fibromyalgia, chronic pain, referred for evaluation of palpitations.  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  Symptoms are infrequent.  We will check echocardiogram.  No other medical therapy necessary at this time.  If her symptoms were to increase in frequency and severity, would then recommend cardiac telemetry.  Hyperlipidemia: LDL not at goal, at 145.  She recently started taking Lipitor.  Follow-up with PCP with repeat lipid panel in future.  I will see her on  as-needed basis.   Thank you for referring the patient to [place]. Please feel free to contact with any questions.   Nigel Mormon, MD Pager: [phone] Office: [phone]

## 2021-05-26 ENCOUNTER — Other Ambulatory Visit: Payer: Self-pay

## 2021-05-26 ENCOUNTER — Encounter: Payer: Self-pay | Admitting: Cardiology

## 2021-05-26 ENCOUNTER — Ambulatory Visit: Payer: Managed Care, Other (non HMO) | Admitting: Cardiology

## 2021-05-26 VITALS — BP 125/69 | HR 84 | Temp 98.0°F | Resp 16 | Ht 68.0 in | Wt 239.0 lb

## 2021-05-26 DIAGNOSIS — E782 Mixed hyperlipidemia: Secondary | ICD-10-CM | POA: Diagnosis not present

## 2021-05-26 DIAGNOSIS — R002 Palpitations: Secondary | ICD-10-CM | POA: Diagnosis not present

## 2021-05-26 DIAGNOSIS — E119 Type 2 diabetes mellitus without complications: Secondary | ICD-10-CM | POA: Diagnosis not present

## 2021-05-26 DIAGNOSIS — R9431 Abnormal electrocardiogram [ECG] [EKG]: Secondary | ICD-10-CM | POA: Diagnosis not present

## 2021-05-27 DIAGNOSIS — M797 Fibromyalgia: Secondary | ICD-10-CM | POA: Diagnosis not present

## 2021-05-27 DIAGNOSIS — M5459 Other low back pain: Secondary | ICD-10-CM | POA: Diagnosis not present

## 2021-05-27 DIAGNOSIS — M542 Cervicalgia: Secondary | ICD-10-CM | POA: Diagnosis not present

## 2021-05-31 DIAGNOSIS — M542 Cervicalgia: Secondary | ICD-10-CM | POA: Diagnosis not present

## 2021-05-31 DIAGNOSIS — M5459 Other low back pain: Secondary | ICD-10-CM | POA: Diagnosis not present

## 2021-05-31 DIAGNOSIS — M797 Fibromyalgia: Secondary | ICD-10-CM | POA: Diagnosis not present

## 2021-06-01 ENCOUNTER — Ambulatory Visit: Payer: BC Managed Care – PPO

## 2021-06-01 ENCOUNTER — Other Ambulatory Visit: Payer: Self-pay

## 2021-06-01 DIAGNOSIS — R9431 Abnormal electrocardiogram [ECG] [EKG]: Secondary | ICD-10-CM | POA: Diagnosis not present

## 2021-06-07 DIAGNOSIS — M797 Fibromyalgia: Secondary | ICD-10-CM | POA: Diagnosis not present

## 2021-06-07 DIAGNOSIS — M542 Cervicalgia: Secondary | ICD-10-CM | POA: Diagnosis not present

## 2021-06-07 DIAGNOSIS — M5459 Other low back pain: Secondary | ICD-10-CM | POA: Diagnosis not present

## 2021-06-09 DIAGNOSIS — R4789 Other speech disturbances: Secondary | ICD-10-CM | POA: Diagnosis not present

## 2021-06-09 DIAGNOSIS — I679 Cerebrovascular disease, unspecified: Secondary | ICD-10-CM | POA: Diagnosis not present

## 2021-06-14 DIAGNOSIS — M797 Fibromyalgia: Secondary | ICD-10-CM | POA: Diagnosis not present

## 2021-06-14 DIAGNOSIS — M5459 Other low back pain: Secondary | ICD-10-CM | POA: Diagnosis not present

## 2021-06-14 DIAGNOSIS — M542 Cervicalgia: Secondary | ICD-10-CM | POA: Diagnosis not present

## 2021-06-21 DIAGNOSIS — M797 Fibromyalgia: Secondary | ICD-10-CM | POA: Diagnosis not present

## 2021-06-21 DIAGNOSIS — M5459 Other low back pain: Secondary | ICD-10-CM | POA: Diagnosis not present

## 2021-06-21 DIAGNOSIS — M542 Cervicalgia: Secondary | ICD-10-CM | POA: Diagnosis not present

## 2021-06-28 DIAGNOSIS — M797 Fibromyalgia: Secondary | ICD-10-CM | POA: Diagnosis not present

## 2021-06-28 DIAGNOSIS — M5459 Other low back pain: Secondary | ICD-10-CM | POA: Diagnosis not present

## 2021-06-28 DIAGNOSIS — M542 Cervicalgia: Secondary | ICD-10-CM | POA: Diagnosis not present

## 2021-06-29 DIAGNOSIS — M542 Cervicalgia: Secondary | ICD-10-CM | POA: Diagnosis not present

## 2021-06-29 DIAGNOSIS — M545 Low back pain, unspecified: Secondary | ICD-10-CM | POA: Diagnosis not present

## 2021-06-29 DIAGNOSIS — R2689 Other abnormalities of gait and mobility: Secondary | ICD-10-CM | POA: Diagnosis not present

## 2021-06-29 DIAGNOSIS — Z760 Encounter for issue of repeat prescription: Secondary | ICD-10-CM | POA: Diagnosis not present

## 2021-07-04 DIAGNOSIS — M5459 Other low back pain: Secondary | ICD-10-CM | POA: Diagnosis not present

## 2021-07-04 DIAGNOSIS — M797 Fibromyalgia: Secondary | ICD-10-CM | POA: Diagnosis not present

## 2021-07-04 DIAGNOSIS — M542 Cervicalgia: Secondary | ICD-10-CM | POA: Diagnosis not present

## 2021-07-14 ENCOUNTER — Other Ambulatory Visit: Payer: Self-pay

## 2021-07-14 ENCOUNTER — Emergency Department (HOSPITAL_BASED_OUTPATIENT_CLINIC_OR_DEPARTMENT_OTHER): Payer: BC Managed Care – PPO

## 2021-07-14 ENCOUNTER — Encounter (HOSPITAL_BASED_OUTPATIENT_CLINIC_OR_DEPARTMENT_OTHER): Payer: Self-pay | Admitting: Emergency Medicine

## 2021-07-14 ENCOUNTER — Emergency Department (HOSPITAL_BASED_OUTPATIENT_CLINIC_OR_DEPARTMENT_OTHER)
Admission: EM | Admit: 2021-07-14 | Discharge: 2021-07-15 | Disposition: A | Discharge status: Home / Self Care | Payer: BC Managed Care – PPO | Attending: Emergency Medicine | Admitting: Emergency Medicine

## 2021-07-14 DIAGNOSIS — R1084 Generalized abdominal pain: Secondary | ICD-10-CM | POA: Insufficient documentation

## 2021-07-14 DIAGNOSIS — R7309 Other abnormal glucose: Secondary | ICD-10-CM | POA: Diagnosis not present

## 2021-07-14 DIAGNOSIS — I700 Atherosclerosis of aorta: Secondary | ICD-10-CM | POA: Diagnosis not present

## 2021-07-14 DIAGNOSIS — R9431 Abnormal electrocardiogram [ECG] [EKG]: Secondary | ICD-10-CM | POA: Diagnosis not present

## 2021-07-14 DIAGNOSIS — R109 Unspecified abdominal pain: Secondary | ICD-10-CM | POA: Diagnosis not present

## 2021-07-14 LAB — COMPREHENSIVE METABOLIC PANEL
ALT: 12 U/L (ref 0–44)
AST: 14 U/L — ABNORMAL LOW (ref 15–41)
Albumin: 4.2 g/dL (ref 3.5–5.0)
Alkaline Phosphatase: 131 U/L — ABNORMAL HIGH (ref 38–126)
Anion gap: 11 (ref 5–15)
BUN: 12 mg/dL (ref 6–20)
CO2: 28 mmol/L (ref 22–32)
Calcium: 9.6 mg/dL (ref 8.9–10.3)
Chloride: 96 mmol/L — ABNORMAL LOW (ref 98–111)
Creatinine, Ser: 1.08 mg/dL — ABNORMAL HIGH (ref 0.44–1.00)
GFR, Estimated: >60 mL/min (ref >60)
Glucose, Bld: 231 mg/dL — ABNORMAL HIGH (ref 70–99)
Potassium: 3.9 mmol/L (ref 3.5–5.1)
Sodium: 135 mmol/L (ref 135–145)
Total Bilirubin: 1.1 mg/dL (ref 0.3–1.2)
Total Protein: 8.6 g/dL — ABNORMAL HIGH (ref 6.5–8.1)

## 2021-07-14 LAB — PREGNANCY, URINE: Preg Test, Ur: NEGATIVE

## 2021-07-14 LAB — URINALYSIS, ROUTINE W REFLEX MICROSCOPIC
Bilirubin Urine: NEGATIVE
Glucose, UA: NEGATIVE mg/dL
Hgb urine dipstick: NEGATIVE
Ketones, ur: 15 mg/dL — AB
Leukocytes,Ua: NEGATIVE
Nitrite: NEGATIVE
Specific Gravity, Urine: >1.046 — ABNORMAL HIGH (ref 1.005–1.030)
pH: 5.5 (ref 5.0–8.0)

## 2021-07-14 LAB — CBC
HCT: 46.2 % — ABNORMAL HIGH (ref 36.0–46.0)
Hemoglobin: 15.4 g/dL — ABNORMAL HIGH (ref 12.0–15.0)
MCH: 28.3 pg (ref 26.0–34.0)
MCHC: 33.3 g/dL (ref 30.0–36.0)
MCV: 84.8 fL (ref 80.0–100.0)
Platelets: 257 10*3/uL (ref 150–400)
RBC: 5.45 MIL/uL — ABNORMAL HIGH (ref 3.87–5.11)
RDW: 12.6 % (ref 11.5–15.5)
WBC: 9.5 10*3/uL (ref 4.0–10.5)
nRBC: 0 % (ref 0.0–0.2)

## 2021-07-14 LAB — LIPASE, BLOOD: Lipase: 10 U/L — ABNORMAL LOW (ref 11–51)

## 2021-07-14 LAB — CBG MONITORING, ED: Glucose-Capillary: 143 mg/dL — ABNORMAL HIGH (ref 70–99)

## 2021-07-14 MED ORDER — ALUM & MAG HYDROXIDE-SIMETH 200-200-20 MG/5ML PO SUSP
30.0000 mL | Freq: Once | ORAL | Status: AC
  Administered 2021-07-14: 30 mL via ORAL
  Filled 2021-07-14: qty 30

## 2021-07-14 MED ORDER — LIDOCAINE VISCOUS HCL 2 % MT SOLN
15.0000 mL | Freq: Once | OROMUCOSAL | Status: AC
  Administered 2021-07-14: 15 mL via ORAL
  Filled 2021-07-14: qty 15

## 2021-07-14 MED ORDER — ONDANSETRON HCL 4 MG/2ML IJ SOLN
4.0000 mg | Freq: Once | INTRAMUSCULAR | Status: AC
  Administered 2021-07-14: 4 mg via INTRAVENOUS
  Filled 2021-07-14: qty 2

## 2021-07-14 MED ORDER — SODIUM CHLORIDE 0.9 % IV BOLUS
1000.0000 mL | Freq: Once | INTRAVENOUS | Status: AC
  Administered 2021-07-14: 1000 mL via INTRAVENOUS

## 2021-07-14 MED ORDER — IOHEXOL 300 MG/ML  SOLN
100.0000 mL | Freq: Once | INTRAMUSCULAR | Status: AC | PRN
  Administered 2021-07-14: 100 mL via INTRAVENOUS

## 2021-07-14 NOTE — Discharge Instructions (Signed)
Follow-up with your primary care doctor.  Come back to ER if you have worsening pain, vomiting, fever or other new concerning symptoms. ?

## 2021-07-14 NOTE — ED Triage Notes (Addendum)
Pt initially come in for hypoglycemia, she stated that her 73 at approx 1815. Pt stated that she has been drinking soda to get her sugar up. Current CBG 143. Pt  stated that she has cramping in her abdomin, feels that she is dehydrated and 1 episode of emesis last night.  ?

## 2021-07-14 NOTE — ED Provider Notes (Signed)
?[facility] ?Provider Note ? ? ?CSN: [id number] ?Arrival date & time: 07/14/21  1831 ? ?  ? ?History ? ?Chief Complaint  ?Patient presents with  ? Abdominal Pain  ? ? ?Karen Stein is a 51 y.o. female.  Presented to the emergency department with chief complaint of abdominal pain and low blood sugar.  Reports that blood sugar was as low as 75 earlier.  She had some associated abdominal pain over the past 24 hours and had 1 episode of vomiting last night but none today.  Pain is in her lower abdomen, mild to moderate, cramping, no alleviating or aggravating factors. ? ?T2DM, on insulin.  ? ?HPI ? ?  ? ?Home Medications ?Prior to Admission medications   ?Medication Sig Start Date End Date Taking? Authorizing Provider  ?atorvastatin (LIPITOR) 20 MG tablet Take 1 tablet (20 mg total) by mouth daily at 6 PM. 08/01/18   Black, Lezlie Octave, NP  ?ciclopirox (PENLAC) 8 % solution Apply topically. 01/13/21   [provider]  ?Insulin Aspart, w/Niacinamide, (FIASP) 100 UNIT/ML SOLN Inject 5-16 Units into the skin with breakfast, with lunch, and with evening meal. Per sliding scale 09/11/19   [provider]  ?lidocaine (LIDODERM) 5 % Place 1 patch onto the skin daily. Remove & Discard patch within 12 hours or as directed by MD 08/23/19   Randal Buba, April, MD  ?methocarbamol (ROBAXIN) 500 MG tablet Take 1 tablet (500 mg total) by mouth every 8 (eight) hours as needed for muscle spasms. 11/10/20   Maudie Flakes, MD  ?metoCLOPramide (REGLAN) 5 MG tablet Take 1 tablet (5 mg total) by mouth every 6 (six) hours as needed for nausea. 12/02/19   Robinson, [place] N, PA-C  ?ondansetron (ZOFRAN ODT) 4 MG disintegrating tablet Take 1 tablet (4 mg total) by mouth every 8 (eight) hours as needed for nausea or vomiting. 01/09/21   Tegeler, Gwenyth Allegra, MD  ?ondansetron (ZOFRAN ODT) 4 MG disintegrating tablet Take 1 tablet (4 mg total) by mouth every 8 (eight) hours as needed for nausea or vomiting.  03/21/21   Idamae Lusher, MD  ?Oxycodone HCl 10 MG TABS Take 10 mg by mouth every 6 (six) hours. 11/07/19   [provider]  ?pregabalin (LYRICA) 200 MG capsule Take 200 mg by mouth 3 (three) times daily. 01/27/21   [provider]  ?Semaglutide,0.25 or 0.'5MG'$ /DOS, (OZEMPIC, 0.25 OR 0.5 MG/DOSE,) 2 MG/1.5ML SOPN Inject 0.4 mLs into the skin once a week. 09/11/19   [provider]  ?   ? ?Allergies    ?Amoxicillin, Macrobid [nitrofurantoin macrocrystal], Shellfish allergy, and Sulfa antibiotics   ? ?Review of Systems   ?Review of Systems  ?Constitutional:  Negative for chills and fever.  ?HENT:  Negative for ear pain and sore throat.   ?Eyes:  Negative for pain and visual disturbance.  ?Respiratory:  Negative for cough and shortness of breath.   ?Cardiovascular:  Negative for chest pain and palpitations.  ?Gastrointestinal:  Positive for abdominal pain. Negative for vomiting.  ?Genitourinary:  Negative for dysuria and hematuria.  ?Musculoskeletal:  Negative for arthralgias and back pain.  ?Skin:  Negative for color change and rash.  ?Neurological:  Negative for seizures and syncope.  ?All other systems reviewed and are negative. ? ?Physical Exam ?Updated Vital Signs ?BP 124/72 (BP Location: Right Arm)   Pulse 96   Temp 98.4 ?F (36.9 ?C) (Oral)   Resp 18   Ht '5\' 8"'$  (1.727 m)   Wt 106.6  kg   LMP 11/13/2015 (Exact Date)   SpO2 96%   BMI 35.73 kg/m?  ?Physical Exam ?Vitals and nursing note reviewed.  ?Constitutional:   ?   General: She is not in acute distress. ?   Appearance: She is well-developed.  ?HENT:  ?   Head: Normocephalic and atraumatic.  ?Eyes:  ?   Conjunctiva/sclera: Conjunctivae normal.  ?Cardiovascular:  ?   Rate and Rhythm: Normal rate and regular rhythm.  ?   Heart sounds: No murmur heard. ?Pulmonary:  ?   Effort: Pulmonary effort is normal. No respiratory distress.  ?   Breath sounds: Normal breath sounds.  ?Abdominal:  ?   Palpations: Abdomen is soft.  ?   Tenderness:  There is no abdominal tenderness.  ?   Comments: Some tenderness palpation noted in the right lower quadrant and left lower quadrant, no rebound or guarding  ?Musculoskeletal:     ?   General: No swelling.  ?   Cervical back: Neck supple.  ?Skin: ?   General: Skin is warm and dry.  ?   Capillary Refill: Capillary refill takes less than 2 seconds.  ?Neurological:  ?   Mental Status: She is alert.  ?Psychiatric:     ?   Mood and Affect: Mood normal.  ? ? ?ED Results / Procedures / Treatments   ?Labs ?(all labs ordered are listed, but only abnormal results are displayed) ?Labs Reviewed  ?LIPASE, BLOOD - Abnormal; Notable for the following components:  ?    Result Value  ? Lipase 10 (*)   ? All other components within normal limits  ?COMPREHENSIVE METABOLIC PANEL - Abnormal; Notable for the following components:  ? Chloride 96 (*)   ? Glucose, Bld 231 (*)   ? Creatinine, Ser 1.08 (*)   ? Total Protein 8.6 (*)   ? AST 14 (*)   ? Alkaline Phosphatase 131 (*)   ? All other components within normal limits  ?CBC - Abnormal; Notable for the following components:  ? RBC 5.45 (*)   ? Hemoglobin 15.4 (*)   ? HCT 46.2 (*)   ? All other components within normal limits  ?URINALYSIS, ROUTINE W REFLEX MICROSCOPIC - Abnormal; Notable for the following components:  ? Specific Gravity, Urine >1.046 (*)   ? Ketones, ur 15 (*)   ? Protein, ur TRACE (*)   ? All other components within normal limits  ?CBG MONITORING, ED - Abnormal; Notable for the following components:  ? Glucose-Capillary 143 (*)   ? All other components within normal limits  ?PREGNANCY, URINE  ? ? ?EKG ?EKG Interpretation ? ?Date/Time:  Thursday July 14 2021 18:48:28 EDT ?Ventricular Rate:  100 ?PR Interval:  150 ?QRS Duration: 80 ?QT Interval:  334 ?QTC Calculation: 430 ?R Axis:   78 ?Text Interpretation: Normal sinus rhythm Normal ECG When compared with ECG of 21-Mar-2021 18:27, PREVIOUS ECG IS PRESENT Confirmed by Davonna Belling [phone]) on 07/15/2021 2:13:50  PM ? ?Radiology ?CT ABDOMEN PELVIS W CONTRAST ? ?Result Date: 07/14/2021 ?CLINICAL DATA:  Acute abdominal pain. EXAM: CT ABDOMEN AND PELVIS WITH CONTRAST TECHNIQUE: Multidetector CT imaging of the abdomen and pelvis was performed using the standard protocol following bolus administration of intravenous contrast. RADIATION DOSE REDUCTION: This exam was performed according to the departmental dose-optimization program which includes automated exposure control, adjustment of the mA and/or kV according to patient size and/or use of iterative reconstruction technique. CONTRAST:  141m OMNIPAQUE IOHEXOL 300 MG/ML  SOLN COMPARISON:  CT abdomen  and pelvis 12/02/2019 FINDINGS: Lower chest: No acute abnormality. Hepatobiliary: Small layering gallstones versus gallbladder sludge. No biliary ductal dilatation. The liver is within normal limits. Pancreas: Unremarkable. No pancreatic ductal dilatation or surrounding inflammatory changes. Spleen: Normal in size without focal abnormality. Adrenals/Urinary Tract: Adrenal glands are unremarkable. Kidneys are normal, without renal calculi, focal lesion, or hydronephrosis. Bladder is unremarkable. Stomach/Bowel: Stomach is within normal limits. Appendix appears normal. No evidence of bowel wall thickening, distention, or inflammatory changes. There is some scattered colonic diverticula. Vascular/Lymphatic: Aortic atherosclerosis. No enlarged abdominal or pelvic lymph nodes. Reproductive: Anterior uterine fibroid measuring 12 mm again noted. The ovaries are nonenlarged. Other: No abdominal wall hernia or abnormality. No abdominopelvic ascites. Musculoskeletal: L4-L5 scratch at L5-S1 posterior fusion hardware present. Multilevel degenerative changes affect the spine. Thoracic spinal cord stimulator device present. IMPRESSION: 1. No acute localizing process in the abdomen or pelvis. 2. Colonic diverticulosis without evidence for diverticulitis. 3. Small amount of gallbladder sludge versus  small layering stones. 4. Uterine fibroid. 5.  Aortic Atherosclerosis (ICD10-I70.0). Electronically Signed   By: Ronney Asters M.D.   On: 07/14/2021 21:38   ? ?Procedures ?Procedures  ? ? ?Medications Ordered in ED ?[facility]

## 2021-07-19 DIAGNOSIS — M797 Fibromyalgia: Secondary | ICD-10-CM | POA: Diagnosis not present

## 2021-07-19 DIAGNOSIS — M5459 Other low back pain: Secondary | ICD-10-CM | POA: Diagnosis not present

## 2021-07-19 DIAGNOSIS — M542 Cervicalgia: Secondary | ICD-10-CM | POA: Diagnosis not present

## 2021-07-20 DIAGNOSIS — E1165 Type 2 diabetes mellitus with hyperglycemia: Secondary | ICD-10-CM | POA: Diagnosis not present

## 2021-07-20 DIAGNOSIS — Z9641 Presence of insulin pump (external) (internal): Secondary | ICD-10-CM | POA: Diagnosis not present

## 2021-07-20 DIAGNOSIS — Z794 Long term (current) use of insulin: Secondary | ICD-10-CM | POA: Diagnosis not present

## 2021-07-20 DIAGNOSIS — Z6835 Body mass index (BMI) 35.0-35.9, adult: Secondary | ICD-10-CM | POA: Diagnosis not present

## 2021-07-20 DIAGNOSIS — Z7985 Long-term (current) use of injectable non-insulin antidiabetic drugs: Secondary | ICD-10-CM | POA: Diagnosis not present

## 2021-07-21 DIAGNOSIS — Z1231 Encounter for screening mammogram for malignant neoplasm of breast: Secondary | ICD-10-CM | POA: Diagnosis not present

## 2021-07-26 DIAGNOSIS — M542 Cervicalgia: Secondary | ICD-10-CM | POA: Diagnosis not present

## 2021-07-26 DIAGNOSIS — M5459 Other low back pain: Secondary | ICD-10-CM | POA: Diagnosis not present

## 2021-07-26 DIAGNOSIS — M797 Fibromyalgia: Secondary | ICD-10-CM | POA: Diagnosis not present

## 2021-07-28 DIAGNOSIS — M5459 Other low back pain: Secondary | ICD-10-CM | POA: Diagnosis not present

## 2021-07-28 DIAGNOSIS — M797 Fibromyalgia: Secondary | ICD-10-CM | POA: Diagnosis not present

## 2021-07-28 DIAGNOSIS — M542 Cervicalgia: Secondary | ICD-10-CM | POA: Diagnosis not present

## 2021-08-02 DIAGNOSIS — M542 Cervicalgia: Secondary | ICD-10-CM | POA: Diagnosis not present

## 2021-08-02 DIAGNOSIS — M5459 Other low back pain: Secondary | ICD-10-CM | POA: Diagnosis not present

## 2021-08-02 DIAGNOSIS — M797 Fibromyalgia: Secondary | ICD-10-CM | POA: Diagnosis not present

## 2021-08-05 DIAGNOSIS — N390 Urinary tract infection, site not specified: Secondary | ICD-10-CM | POA: Diagnosis not present

## 2021-08-05 DIAGNOSIS — I6523 Occlusion and stenosis of bilateral carotid arteries: Secondary | ICD-10-CM | POA: Diagnosis not present

## 2021-08-05 DIAGNOSIS — E785 Hyperlipidemia, unspecified: Secondary | ICD-10-CM | POA: Diagnosis not present

## 2021-08-05 DIAGNOSIS — E119 Type 2 diabetes mellitus without complications: Secondary | ICD-10-CM | POA: Diagnosis not present

## 2021-08-09 DIAGNOSIS — M797 Fibromyalgia: Secondary | ICD-10-CM | POA: Diagnosis not present

## 2021-08-09 DIAGNOSIS — M5459 Other low back pain: Secondary | ICD-10-CM | POA: Diagnosis not present

## 2021-08-09 DIAGNOSIS — M542 Cervicalgia: Secondary | ICD-10-CM | POA: Diagnosis not present

## 2021-08-11 DIAGNOSIS — M542 Cervicalgia: Secondary | ICD-10-CM | POA: Diagnosis not present

## 2021-08-11 DIAGNOSIS — M5459 Other low back pain: Secondary | ICD-10-CM | POA: Diagnosis not present

## 2021-08-11 DIAGNOSIS — M797 Fibromyalgia: Secondary | ICD-10-CM | POA: Diagnosis not present

## 2021-08-16 DIAGNOSIS — M5459 Other low back pain: Secondary | ICD-10-CM | POA: Diagnosis not present

## 2021-08-16 DIAGNOSIS — M542 Cervicalgia: Secondary | ICD-10-CM | POA: Diagnosis not present

## 2021-08-16 DIAGNOSIS — M797 Fibromyalgia: Secondary | ICD-10-CM | POA: Diagnosis not present

## 2021-08-18 DIAGNOSIS — M797 Fibromyalgia: Secondary | ICD-10-CM | POA: Diagnosis not present

## 2021-08-18 DIAGNOSIS — M5459 Other low back pain: Secondary | ICD-10-CM | POA: Diagnosis not present

## 2021-08-18 DIAGNOSIS — M542 Cervicalgia: Secondary | ICD-10-CM | POA: Diagnosis not present

## 2021-08-19 DIAGNOSIS — H93293 Other abnormal auditory perceptions, bilateral: Secondary | ICD-10-CM | POA: Diagnosis not present

## 2021-08-23 DIAGNOSIS — M542 Cervicalgia: Secondary | ICD-10-CM | POA: Diagnosis not present

## 2021-08-23 DIAGNOSIS — M5459 Other low back pain: Secondary | ICD-10-CM | POA: Diagnosis not present

## 2021-08-23 DIAGNOSIS — M797 Fibromyalgia: Secondary | ICD-10-CM | POA: Diagnosis not present

## 2021-08-30 DIAGNOSIS — M542 Cervicalgia: Secondary | ICD-10-CM | POA: Diagnosis not present

## 2021-08-30 DIAGNOSIS — M5459 Other low back pain: Secondary | ICD-10-CM | POA: Diagnosis not present

## 2021-08-30 DIAGNOSIS — M797 Fibromyalgia: Secondary | ICD-10-CM | POA: Diagnosis not present

## 2021-08-31 DIAGNOSIS — M542 Cervicalgia: Secondary | ICD-10-CM | POA: Diagnosis not present

## 2021-08-31 DIAGNOSIS — M5459 Other low back pain: Secondary | ICD-10-CM | POA: Diagnosis not present

## 2021-08-31 DIAGNOSIS — M797 Fibromyalgia: Secondary | ICD-10-CM | POA: Diagnosis not present

## 2021-09-01 DIAGNOSIS — M5459 Other low back pain: Secondary | ICD-10-CM | POA: Diagnosis not present

## 2021-09-01 DIAGNOSIS — M797 Fibromyalgia: Secondary | ICD-10-CM | POA: Diagnosis not present

## 2021-09-01 DIAGNOSIS — M542 Cervicalgia: Secondary | ICD-10-CM | POA: Diagnosis not present

## 2021-09-05 DIAGNOSIS — E119 Type 2 diabetes mellitus without complications: Secondary | ICD-10-CM | POA: Diagnosis not present

## 2021-09-05 DIAGNOSIS — I6523 Occlusion and stenosis of bilateral carotid arteries: Secondary | ICD-10-CM | POA: Diagnosis not present

## 2021-09-05 DIAGNOSIS — R413 Other amnesia: Secondary | ICD-10-CM | POA: Diagnosis not present

## 2021-09-05 DIAGNOSIS — N390 Urinary tract infection, site not specified: Secondary | ICD-10-CM | POA: Diagnosis not present

## 2021-09-06 DIAGNOSIS — M5459 Other low back pain: Secondary | ICD-10-CM | POA: Diagnosis not present

## 2021-09-06 DIAGNOSIS — M542 Cervicalgia: Secondary | ICD-10-CM | POA: Diagnosis not present

## 2021-09-06 DIAGNOSIS — M797 Fibromyalgia: Secondary | ICD-10-CM | POA: Diagnosis not present

## 2021-09-08 DIAGNOSIS — M542 Cervicalgia: Secondary | ICD-10-CM | POA: Diagnosis not present

## 2021-09-08 DIAGNOSIS — M5459 Other low back pain: Secondary | ICD-10-CM | POA: Diagnosis not present

## 2021-09-08 DIAGNOSIS — M797 Fibromyalgia: Secondary | ICD-10-CM | POA: Diagnosis not present

## 2021-09-13 DIAGNOSIS — M542 Cervicalgia: Secondary | ICD-10-CM | POA: Diagnosis not present

## 2021-09-13 DIAGNOSIS — M797 Fibromyalgia: Secondary | ICD-10-CM | POA: Diagnosis not present

## 2021-09-13 DIAGNOSIS — M5459 Other low back pain: Secondary | ICD-10-CM | POA: Diagnosis not present

## 2021-09-14 DIAGNOSIS — R051 Acute cough: Secondary | ICD-10-CM | POA: Diagnosis not present

## 2021-09-14 DIAGNOSIS — J209 Acute bronchitis, unspecified: Secondary | ICD-10-CM | POA: Diagnosis not present

## 2021-09-14 DIAGNOSIS — J028 Acute pharyngitis due to other specified organisms: Secondary | ICD-10-CM | POA: Diagnosis not present

## 2021-09-14 DIAGNOSIS — R5081 Fever presenting with conditions classified elsewhere: Secondary | ICD-10-CM | POA: Diagnosis not present

## 2021-09-19 DIAGNOSIS — K581 Irritable bowel syndrome with constipation: Secondary | ICD-10-CM | POA: Diagnosis not present

## 2021-09-19 DIAGNOSIS — Z79899 Other long term (current) drug therapy: Secondary | ICD-10-CM | POA: Diagnosis not present

## 2021-09-19 DIAGNOSIS — R634 Abnormal weight loss: Secondary | ICD-10-CM | POA: Diagnosis not present

## 2021-09-19 DIAGNOSIS — R112 Nausea with vomiting, unspecified: Secondary | ICD-10-CM | POA: Diagnosis not present

## 2021-09-20 DIAGNOSIS — M5459 Other low back pain: Secondary | ICD-10-CM | POA: Diagnosis not present

## 2021-09-20 DIAGNOSIS — M797 Fibromyalgia: Secondary | ICD-10-CM | POA: Diagnosis not present

## 2021-09-20 DIAGNOSIS — M542 Cervicalgia: Secondary | ICD-10-CM | POA: Diagnosis not present

## 2021-09-21 DIAGNOSIS — Z9641 Presence of insulin pump (external) (internal): Secondary | ICD-10-CM | POA: Diagnosis not present

## 2021-09-21 DIAGNOSIS — Z7985 Long-term (current) use of injectable non-insulin antidiabetic drugs: Secondary | ICD-10-CM | POA: Diagnosis not present

## 2021-09-21 DIAGNOSIS — E1165 Type 2 diabetes mellitus with hyperglycemia: Secondary | ICD-10-CM | POA: Diagnosis not present

## 2021-09-21 DIAGNOSIS — R7989 Other specified abnormal findings of blood chemistry: Secondary | ICD-10-CM | POA: Diagnosis not present

## 2021-09-21 DIAGNOSIS — Z794 Long term (current) use of insulin: Secondary | ICD-10-CM | POA: Diagnosis not present

## 2021-09-21 DIAGNOSIS — Z6835 Body mass index (BMI) 35.0-35.9, adult: Secondary | ICD-10-CM | POA: Diagnosis not present

## 2021-09-27 DIAGNOSIS — M542 Cervicalgia: Secondary | ICD-10-CM | POA: Diagnosis not present

## 2021-09-27 DIAGNOSIS — M797 Fibromyalgia: Secondary | ICD-10-CM | POA: Diagnosis not present

## 2021-09-27 DIAGNOSIS — M5459 Other low back pain: Secondary | ICD-10-CM | POA: Diagnosis not present

## 2021-09-28 DIAGNOSIS — E119 Type 2 diabetes mellitus without complications: Secondary | ICD-10-CM | POA: Diagnosis not present

## 2021-09-28 DIAGNOSIS — R413 Other amnesia: Secondary | ICD-10-CM | POA: Diagnosis not present

## 2021-09-28 DIAGNOSIS — N390 Urinary tract infection, site not specified: Secondary | ICD-10-CM | POA: Diagnosis not present

## 2021-09-28 DIAGNOSIS — I6523 Occlusion and stenosis of bilateral carotid arteries: Secondary | ICD-10-CM | POA: Diagnosis not present

## 2021-10-04 DIAGNOSIS — M545 Low back pain, unspecified: Secondary | ICD-10-CM | POA: Diagnosis not present

## 2021-10-04 DIAGNOSIS — Z79899 Other long term (current) drug therapy: Secondary | ICD-10-CM | POA: Diagnosis not present

## 2021-10-04 DIAGNOSIS — M542 Cervicalgia: Secondary | ICD-10-CM | POA: Diagnosis not present

## 2021-10-04 DIAGNOSIS — R252 Cramp and spasm: Secondary | ICD-10-CM | POA: Diagnosis not present

## 2021-10-05 DIAGNOSIS — R112 Nausea with vomiting, unspecified: Secondary | ICD-10-CM | POA: Diagnosis not present

## 2021-10-05 DIAGNOSIS — E119 Type 2 diabetes mellitus without complications: Secondary | ICD-10-CM | POA: Diagnosis not present

## 2021-10-05 DIAGNOSIS — K635 Polyp of colon: Secondary | ICD-10-CM | POA: Diagnosis not present

## 2021-10-05 DIAGNOSIS — Z1211 Encounter for screening for malignant neoplasm of colon: Secondary | ICD-10-CM | POA: Diagnosis not present

## 2021-10-06 DIAGNOSIS — M797 Fibromyalgia: Secondary | ICD-10-CM | POA: Diagnosis not present

## 2021-10-06 DIAGNOSIS — M5459 Other low back pain: Secondary | ICD-10-CM | POA: Diagnosis not present

## 2021-10-06 DIAGNOSIS — M542 Cervicalgia: Secondary | ICD-10-CM | POA: Diagnosis not present

## 2021-10-11 DIAGNOSIS — E785 Hyperlipidemia, unspecified: Secondary | ICD-10-CM | POA: Diagnosis not present

## 2021-10-11 DIAGNOSIS — M5136 Other intervertebral disc degeneration, lumbar region: Secondary | ICD-10-CM | POA: Diagnosis not present

## 2021-10-11 DIAGNOSIS — M5459 Other low back pain: Secondary | ICD-10-CM | POA: Diagnosis not present

## 2021-10-11 DIAGNOSIS — I6523 Occlusion and stenosis of bilateral carotid arteries: Secondary | ICD-10-CM | POA: Diagnosis not present

## 2021-10-11 DIAGNOSIS — M542 Cervicalgia: Secondary | ICD-10-CM | POA: Diagnosis not present

## 2021-10-11 DIAGNOSIS — E119 Type 2 diabetes mellitus without complications: Secondary | ICD-10-CM | POA: Diagnosis not present

## 2021-10-11 DIAGNOSIS — M797 Fibromyalgia: Secondary | ICD-10-CM | POA: Diagnosis not present

## 2021-10-13 DIAGNOSIS — M542 Cervicalgia: Secondary | ICD-10-CM | POA: Diagnosis not present

## 2021-10-13 DIAGNOSIS — M797 Fibromyalgia: Secondary | ICD-10-CM | POA: Diagnosis not present

## 2021-10-13 DIAGNOSIS — M5459 Other low back pain: Secondary | ICD-10-CM | POA: Diagnosis not present

## 2021-10-18 DIAGNOSIS — M797 Fibromyalgia: Secondary | ICD-10-CM | POA: Diagnosis not present

## 2021-10-18 DIAGNOSIS — M5459 Other low back pain: Secondary | ICD-10-CM | POA: Diagnosis not present

## 2021-10-18 DIAGNOSIS — M542 Cervicalgia: Secondary | ICD-10-CM | POA: Diagnosis not present

## 2021-10-20 DIAGNOSIS — M5459 Other low back pain: Secondary | ICD-10-CM | POA: Diagnosis not present

## 2021-10-20 DIAGNOSIS — M797 Fibromyalgia: Secondary | ICD-10-CM | POA: Diagnosis not present

## 2021-10-20 DIAGNOSIS — M542 Cervicalgia: Secondary | ICD-10-CM | POA: Diagnosis not present

## 2021-10-22 DIAGNOSIS — K2270 Barrett's esophagus without dysplasia: Secondary | ICD-10-CM | POA: Diagnosis not present

## 2021-10-23 DIAGNOSIS — K635 Polyp of colon: Secondary | ICD-10-CM | POA: Diagnosis not present

## 2021-10-25 DIAGNOSIS — M542 Cervicalgia: Secondary | ICD-10-CM | POA: Diagnosis not present

## 2021-10-25 DIAGNOSIS — M5459 Other low back pain: Secondary | ICD-10-CM | POA: Diagnosis not present

## 2021-10-25 DIAGNOSIS — M797 Fibromyalgia: Secondary | ICD-10-CM | POA: Diagnosis not present

## 2021-10-27 DIAGNOSIS — M542 Cervicalgia: Secondary | ICD-10-CM | POA: Diagnosis not present

## 2021-10-27 DIAGNOSIS — M5459 Other low back pain: Secondary | ICD-10-CM | POA: Diagnosis not present

## 2021-10-27 DIAGNOSIS — M797 Fibromyalgia: Secondary | ICD-10-CM | POA: Diagnosis not present

## 2021-11-03 DIAGNOSIS — M542 Cervicalgia: Secondary | ICD-10-CM | POA: Diagnosis not present

## 2021-11-03 DIAGNOSIS — M5459 Other low back pain: Secondary | ICD-10-CM | POA: Diagnosis not present

## 2021-11-03 DIAGNOSIS — M797 Fibromyalgia: Secondary | ICD-10-CM | POA: Diagnosis not present

## 2021-11-08 DIAGNOSIS — M5459 Other low back pain: Secondary | ICD-10-CM | POA: Diagnosis not present

## 2021-11-08 DIAGNOSIS — M542 Cervicalgia: Secondary | ICD-10-CM | POA: Diagnosis not present

## 2021-11-08 DIAGNOSIS — M797 Fibromyalgia: Secondary | ICD-10-CM | POA: Diagnosis not present

## 2021-11-14 DIAGNOSIS — F411 Generalized anxiety disorder: Secondary | ICD-10-CM | POA: Diagnosis not present

## 2021-11-17 DIAGNOSIS — Z01419 Encounter for gynecological examination (general) (routine) without abnormal findings: Secondary | ICD-10-CM | POA: Diagnosis not present

## 2021-11-17 DIAGNOSIS — M797 Fibromyalgia: Secondary | ICD-10-CM | POA: Diagnosis not present

## 2021-11-17 DIAGNOSIS — M5459 Other low back pain: Secondary | ICD-10-CM | POA: Diagnosis not present

## 2021-11-17 DIAGNOSIS — M542 Cervicalgia: Secondary | ICD-10-CM | POA: Diagnosis not present

## 2021-11-17 DIAGNOSIS — Z124 Encounter for screening for malignant neoplasm of cervix: Secondary | ICD-10-CM | POA: Diagnosis not present

## 2021-11-17 DIAGNOSIS — Z6836 Body mass index (BMI) 36.0-36.9, adult: Secondary | ICD-10-CM | POA: Diagnosis not present

## 2021-11-17 DIAGNOSIS — Z1151 Encounter for screening for human papillomavirus (HPV): Secondary | ICD-10-CM | POA: Diagnosis not present

## 2021-11-18 ENCOUNTER — Other Ambulatory Visit: Payer: Self-pay | Admitting: Obstetrics and Gynecology

## 2021-11-18 DIAGNOSIS — Z9189 Other specified personal risk factors, not elsewhere classified: Secondary | ICD-10-CM

## 2021-11-21 DIAGNOSIS — F411 Generalized anxiety disorder: Secondary | ICD-10-CM | POA: Diagnosis not present

## 2021-11-22 DIAGNOSIS — M542 Cervicalgia: Secondary | ICD-10-CM | POA: Diagnosis not present

## 2021-11-22 DIAGNOSIS — M797 Fibromyalgia: Secondary | ICD-10-CM | POA: Diagnosis not present

## 2021-11-22 DIAGNOSIS — M5459 Other low back pain: Secondary | ICD-10-CM | POA: Diagnosis not present

## 2021-11-24 DIAGNOSIS — M5459 Other low back pain: Secondary | ICD-10-CM | POA: Diagnosis not present

## 2021-11-24 DIAGNOSIS — M797 Fibromyalgia: Secondary | ICD-10-CM | POA: Diagnosis not present

## 2021-11-24 DIAGNOSIS — M542 Cervicalgia: Secondary | ICD-10-CM | POA: Diagnosis not present

## 2021-11-25 ENCOUNTER — Emergency Department (HOSPITAL_BASED_OUTPATIENT_CLINIC_OR_DEPARTMENT_OTHER)
Admission: EM | Admit: 2021-11-25 | Discharge: 2021-11-25 | Disposition: A | Discharge status: Home / Self Care | Payer: BC Managed Care – PPO | Attending: Emergency Medicine | Admitting: Emergency Medicine

## 2021-11-25 ENCOUNTER — Other Ambulatory Visit: Payer: Self-pay

## 2021-11-25 ENCOUNTER — Encounter (HOSPITAL_BASED_OUTPATIENT_CLINIC_OR_DEPARTMENT_OTHER): Payer: Self-pay | Admitting: Emergency Medicine

## 2021-11-25 ENCOUNTER — Emergency Department (HOSPITAL_BASED_OUTPATIENT_CLINIC_OR_DEPARTMENT_OTHER): Payer: BC Managed Care – PPO

## 2021-11-25 DIAGNOSIS — E119 Type 2 diabetes mellitus without complications: Secondary | ICD-10-CM | POA: Insufficient documentation

## 2021-11-25 DIAGNOSIS — R0789 Other chest pain: Secondary | ICD-10-CM | POA: Diagnosis not present

## 2021-11-25 DIAGNOSIS — R079 Chest pain, unspecified: Secondary | ICD-10-CM | POA: Insufficient documentation

## 2021-11-25 DIAGNOSIS — R002 Palpitations: Secondary | ICD-10-CM | POA: Insufficient documentation

## 2021-11-25 DIAGNOSIS — Z794 Long term (current) use of insulin: Secondary | ICD-10-CM | POA: Diagnosis not present

## 2021-11-25 LAB — CBC
HCT: 38.7 % (ref 36.0–46.0)
Hemoglobin: 12.9 g/dL (ref 12.0–15.0)
MCH: 29.3 pg (ref 26.0–34.0)
MCHC: 33.3 g/dL (ref 30.0–36.0)
MCV: 87.8 fL (ref 80.0–100.0)
Platelets: 302 10*3/uL (ref 150–400)
RBC: 4.41 MIL/uL (ref 3.87–5.11)
RDW: 13.4 % (ref 11.5–15.5)
WBC: 7.1 10*3/uL (ref 4.0–10.5)
nRBC: 0 % (ref 0.0–0.2)

## 2021-11-25 LAB — BASIC METABOLIC PANEL
Anion gap: 7 (ref 5–15)
BUN: 11 mg/dL (ref 6–20)
CO2: 27 mmol/L (ref 22–32)
Calcium: 8.8 mg/dL — ABNORMAL LOW (ref 8.9–10.3)
Chloride: 105 mmol/L (ref 98–111)
Creatinine, Ser: 0.92 mg/dL (ref 0.44–1.00)
GFR, Estimated: >60 mL/min (ref >60)
Glucose, Bld: 234 mg/dL — ABNORMAL HIGH (ref 70–99)
Potassium: 4.1 mmol/L (ref 3.5–5.1)
Sodium: 139 mmol/L (ref 135–145)

## 2021-11-25 LAB — TROPONIN I (HIGH SENSITIVITY)
Troponin I (High Sensitivity): 3 ng/L (ref <18)
Troponin I (High Sensitivity): 3 ng/L (ref <18)

## 2021-11-25 LAB — CBG MONITORING, ED: Glucose-Capillary: 108 mg/dL — ABNORMAL HIGH (ref 70–99)

## 2021-11-25 LAB — LIPASE, BLOOD: Lipase: 26 U/L (ref 11–51)

## 2021-11-25 MED ORDER — NITROGLYCERIN 0.4 MG SL SUBL
0.4000 mg | SUBLINGUAL_TABLET | Freq: Once | SUBLINGUAL | Status: DC
Start: 2021-11-25 — End: 2021-11-25

## 2021-11-25 MED ORDER — KETOROLAC TROMETHAMINE 15 MG/ML IJ SOLN
15.0000 mg | Freq: Once | INTRAMUSCULAR | Status: AC
  Administered 2021-11-25: 15 mg via INTRAVENOUS
  Filled 2021-11-25: qty 1

## 2021-11-25 NOTE — ED Notes (Signed)
Pt ambulating to bathroom standby to bathroom.

## 2021-11-25 NOTE — ED Notes (Signed)
Pt requesting BG to be checked.

## 2021-11-25 NOTE — ED Notes (Signed)
Pt verbalizes understanding of discharge instructions. Opportunity for questioning and answers were provided. Pt discharged from ED to home.   ? ?

## 2021-11-25 NOTE — ED Provider Notes (Signed)
[facility] Provider Note   CSN: [id number] Arrival date & time: 11/25/21  1753     History  Chief Complaint  Patient presents with   Chest Pain   Palpitations    Karen Stein is a 51 y.o. female.  Pt is a 51 yo female with pmh of hyperlipidemia, obesity, and DM2 presenting for palpitations. Pt admits to hx of intermittent chest pain, neck pain, and arm pain that changes location for approx one week with associated palpitations. Denies prior hx of DVT/PE, hormone use, leg swelling, surgeries in the past four months, or prolonged immobilization.   The history is provided by the patient. No language interpreter was used.  Chest Pain Associated symptoms: palpitations   Associated symptoms: no abdominal pain, no back pain, no cough, no fever, no shortness of breath and no vomiting   Palpitations Associated symptoms: chest pain   Associated symptoms: no back pain, no cough, no shortness of breath and no vomiting        Home Medications Prior to Admission medications   Medication Sig Start Date End Date Taking? Authorizing Provider  atorvastatin (LIPITOR) 20 MG tablet Take 1 tablet (20 mg total) by mouth daily at 6 PM. 08/01/18   Black, Lezlie Octave, NP  ciclopirox (PENLAC) 8 % solution Apply topically. 01/13/21   [provider]  Insulin Aspart, w/Niacinamide, (FIASP) 100 UNIT/ML SOLN Inject 5-16 Units into the skin with breakfast, with lunch, and with evening meal. Per sliding scale 09/11/19   [provider]  lidocaine (LIDODERM) 5 % Place 1 patch onto the skin daily. Remove & Discard patch within 12 hours or as directed by MD 08/23/19   Randal Buba, April, MD  methocarbamol (ROBAXIN) 500 MG tablet Take 1 tablet (500 mg total) by mouth every 8 (eight) hours as needed for muscle spasms. 11/10/20   Maudie Flakes, MD  metoCLOPramide (REGLAN) 5 MG tablet Take 1 tablet (5 mg total) by mouth every 6 (six) hours as needed for nausea. 12/02/19    Robinson, [place] N, PA-C  ondansetron (ZOFRAN ODT) 4 MG disintegrating tablet Take 1 tablet (4 mg total) by mouth every 8 (eight) hours as needed for nausea or vomiting. 01/09/21   Tegeler, Gwenyth Allegra, MD  ondansetron (ZOFRAN ODT) 4 MG disintegrating tablet Take 1 tablet (4 mg total) by mouth every 8 (eight) hours as needed for nausea or vomiting. 03/21/21   Idamae Lusher, MD  Oxycodone HCl 10 MG TABS Take 10 mg by mouth every 6 (six) hours. 11/07/19   [provider]  pregabalin (LYRICA) 200 MG capsule Take 200 mg by mouth 3 (three) times daily. 01/27/21   [provider]  Semaglutide,0.25 or 0.'5MG'$ /DOS, (OZEMPIC, 0.25 OR 0.5 MG/DOSE,) 2 MG/1.5ML SOPN Inject 0.4 mLs into the skin once a week. 09/11/19   [provider]      Allergies    Amoxicillin, Amoxicillin-pot clavulanate, Macrobid [nitrofurantoin macrocrystal], Rosuvastatin, Shellfish allergy, Sulfa antibiotics, and Nitrofurantoin    Review of Systems   Review of Systems  Constitutional:  Negative for chills and fever.  HENT:  Negative for ear pain and sore throat.   Eyes:  Negative for pain and visual disturbance.  Respiratory:  Negative for cough and shortness of breath.   Cardiovascular:  Positive for chest pain and palpitations.  Gastrointestinal:  Negative for abdominal pain and vomiting.  Genitourinary:  Negative for dysuria and hematuria.  Musculoskeletal:  Negative for arthralgias and back pain.  Skin:  Negative for color  change and rash.  Neurological:  Negative for seizures and syncope.  All other systems reviewed and are negative.   Physical Exam Updated Vital Signs BP (!) 148/86   Pulse 79   Temp 99 F (37.2 C) (Oral)   Resp 18   Ht '5\' 8"'$  (1.727 m)   Wt 106.6 kg   LMP 11/13/2015 (Exact Date)   SpO2 100%   BMI 35.73 kg/m  Physical Exam Vitals and nursing note reviewed.  Constitutional:      General: She is not in acute distress.    Appearance: She is well-developed.  HENT:      Head: Normocephalic and atraumatic.  Eyes:     Conjunctiva/sclera: Conjunctivae normal.  Cardiovascular:     Rate and Rhythm: Normal rate and regular rhythm.     Heart sounds: No murmur heard. Pulmonary:     Effort: Pulmonary effort is normal. No respiratory distress.     Breath sounds: Normal breath sounds.  Abdominal:     Palpations: Abdomen is soft.     Tenderness: There is no abdominal tenderness.  Musculoskeletal:        General: No swelling.     Cervical back: Neck supple.  Skin:    General: Skin is warm and dry.     Capillary Refill: Capillary refill takes less than 2 seconds.  Neurological:     Mental Status: She is alert.  Psychiatric:        Mood and Affect: Mood normal.     ED Results / Procedures / Treatments   Labs (all labs ordered are listed, but only abnormal results are displayed) Labs Reviewed  BASIC METABOLIC PANEL - Abnormal; Notable for the following components:      Result Value   Glucose, Bld 234 (*)    Calcium 8.8 (*)    All other components within normal limits  CBG MONITORING, ED - Abnormal; Notable for the following components:   Glucose-Capillary 108 (*)    All other components within normal limits  CBC  LIPASE, BLOOD  TROPONIN I (HIGH SENSITIVITY)  TROPONIN I (HIGH SENSITIVITY)    EKG None  Radiology DG Chest Port 1 View  Result Date: 11/25/2021 CLINICAL DATA:  Chest pain with palpitations. EXAM: PORTABLE CHEST 1 VIEW COMPARISON:  None Available. FINDINGS: The heart size and mediastinal contours are within normal limits. Both lungs are clear. The visualized skeletal structures are unremarkable. Thoracic spinal cord stimulator device grossly unchanged in position. IMPRESSION: No active disease. Electronically Signed   By: Ronney Asters M.D.   On: 11/25/2021 18:25    Procedures Procedures    Medications Ordered in ED Medications  ketorolac (TORADOL) 15 MG/ML injection 15 mg (15 mg Intravenous Given 11/25/21 1903)    ED Course/  Medical Decision Making/ A&P                           Medical Decision Making Amount and/or Complexity of Data Reviewed Labs: ordered. Radiology: ordered.  Risk Prescription drug management.   9:42 PM 51 yo female with pmh of hyperlipidemia, obesity, and DM2 presenting for palpitations and chest pain.  I independently interpreted patient's labs and EKG.  The patient's chest pain is not suggestive of pulmonary embolus, cardiac ischemia, aortic dissection, pericarditis, myocarditis, pulmonary embolism, pneumothorax, pneumonia, Zoster, or esophageal perforation, or other serious etiology.  Historically not abrupt in onset, tearing or ripping, pulses symmetric. EKG nonspecific for ischemia/infarction. No dysrhythmias, brugada, WPW, prolonged QT noted. [  CXR reviewed and WNL] Troponin negative x2. CXR reviewed. Labs without demonstration of acute pathology unless otherwise noted above.   Low HEART Score: 5 points. However patient has complete resolution of palpitations and chest pain at this time. Referred to cardiology for outpatient follow up.  Patient in no distress and overall condition improved here in the ED. Detailed discussions were had with the patient regarding current findings, and need for close f/u with PCP or on call doctor. The patient has been instructed to return immediately if the symptoms worsen in any way for re-evaluation. Patient verbalized understanding and is in agreement with current care plan. All questions answered prior to discharge.         Final Clinical Impression(s) / ED Diagnoses Final diagnoses:  Chest pain, unspecified type    Rx / DC Orders ED Discharge Orders     None         Lianne Cure, DO 37/34/28 [id number]

## 2021-11-25 NOTE — ED Triage Notes (Signed)
Pt arrives to ED with c/o chest pain and palpitations that started x1 week ago. Pt reports that her symptoms are intermittent and last around 10 seconds. Pt reports that her symptoms increased in pain today with associated abdominal pain.

## 2021-11-29 DIAGNOSIS — M797 Fibromyalgia: Secondary | ICD-10-CM | POA: Diagnosis not present

## 2021-11-29 DIAGNOSIS — M5459 Other low back pain: Secondary | ICD-10-CM | POA: Diagnosis not present

## 2021-11-29 DIAGNOSIS — M542 Cervicalgia: Secondary | ICD-10-CM | POA: Diagnosis not present

## 2021-12-01 DIAGNOSIS — M542 Cervicalgia: Secondary | ICD-10-CM | POA: Diagnosis not present

## 2021-12-01 DIAGNOSIS — M5459 Other low back pain: Secondary | ICD-10-CM | POA: Diagnosis not present

## 2021-12-01 DIAGNOSIS — M797 Fibromyalgia: Secondary | ICD-10-CM | POA: Diagnosis not present

## 2021-12-06 DIAGNOSIS — M797 Fibromyalgia: Secondary | ICD-10-CM | POA: Diagnosis not present

## 2021-12-06 DIAGNOSIS — M542 Cervicalgia: Secondary | ICD-10-CM | POA: Diagnosis not present

## 2021-12-06 DIAGNOSIS — M5459 Other low back pain: Secondary | ICD-10-CM | POA: Diagnosis not present

## 2021-12-07 DIAGNOSIS — B351 Tinea unguium: Secondary | ICD-10-CM | POA: Diagnosis not present

## 2021-12-07 DIAGNOSIS — M2041 Other hammer toe(s) (acquired), right foot: Secondary | ICD-10-CM | POA: Diagnosis not present

## 2021-12-07 DIAGNOSIS — M7671 Peroneal tendinitis, right leg: Secondary | ICD-10-CM | POA: Diagnosis not present

## 2021-12-07 DIAGNOSIS — B353 Tinea pedis: Secondary | ICD-10-CM | POA: Diagnosis not present

## 2021-12-08 DIAGNOSIS — M797 Fibromyalgia: Secondary | ICD-10-CM | POA: Diagnosis not present

## 2021-12-08 DIAGNOSIS — G4730 Sleep apnea, unspecified: Secondary | ICD-10-CM | POA: Diagnosis not present

## 2021-12-08 DIAGNOSIS — I4729 Other ventricular tachycardia: Secondary | ICD-10-CM | POA: Diagnosis not present

## 2021-12-08 DIAGNOSIS — M5459 Other low back pain: Secondary | ICD-10-CM | POA: Diagnosis not present

## 2021-12-08 DIAGNOSIS — I499 Cardiac arrhythmia, unspecified: Secondary | ICD-10-CM | POA: Diagnosis not present

## 2021-12-08 DIAGNOSIS — I679 Cerebrovascular disease, unspecified: Secondary | ICD-10-CM | POA: Diagnosis not present

## 2021-12-08 DIAGNOSIS — M542 Cervicalgia: Secondary | ICD-10-CM | POA: Diagnosis not present

## 2021-12-08 DIAGNOSIS — R413 Other amnesia: Secondary | ICD-10-CM | POA: Diagnosis not present

## 2021-12-13 DIAGNOSIS — M5459 Other low back pain: Secondary | ICD-10-CM | POA: Diagnosis not present

## 2021-12-13 DIAGNOSIS — M542 Cervicalgia: Secondary | ICD-10-CM | POA: Diagnosis not present

## 2021-12-13 DIAGNOSIS — M797 Fibromyalgia: Secondary | ICD-10-CM | POA: Diagnosis not present

## 2021-12-15 DIAGNOSIS — M797 Fibromyalgia: Secondary | ICD-10-CM | POA: Diagnosis not present

## 2021-12-15 DIAGNOSIS — M542 Cervicalgia: Secondary | ICD-10-CM | POA: Diagnosis not present

## 2021-12-15 DIAGNOSIS — M5459 Other low back pain: Secondary | ICD-10-CM | POA: Diagnosis not present

## 2021-12-19 DIAGNOSIS — Z86010 Personal history of colonic polyps: Secondary | ICD-10-CM | POA: Diagnosis not present

## 2021-12-19 DIAGNOSIS — R112 Nausea with vomiting, unspecified: Secondary | ICD-10-CM | POA: Diagnosis not present

## 2021-12-19 DIAGNOSIS — K219 Gastro-esophageal reflux disease without esophagitis: Secondary | ICD-10-CM | POA: Diagnosis not present

## 2021-12-19 DIAGNOSIS — K581 Irritable bowel syndrome with constipation: Secondary | ICD-10-CM | POA: Diagnosis not present

## 2021-12-22 DIAGNOSIS — M542 Cervicalgia: Secondary | ICD-10-CM | POA: Diagnosis not present

## 2021-12-22 DIAGNOSIS — M5459 Other low back pain: Secondary | ICD-10-CM | POA: Diagnosis not present

## 2021-12-22 DIAGNOSIS — M797 Fibromyalgia: Secondary | ICD-10-CM | POA: Diagnosis not present

## 2021-12-26 DIAGNOSIS — I679 Cerebrovascular disease, unspecified: Secondary | ICD-10-CM | POA: Diagnosis not present

## 2021-12-27 DIAGNOSIS — M5459 Other low back pain: Secondary | ICD-10-CM | POA: Diagnosis not present

## 2021-12-27 DIAGNOSIS — M542 Cervicalgia: Secondary | ICD-10-CM | POA: Diagnosis not present

## 2021-12-27 DIAGNOSIS — M797 Fibromyalgia: Secondary | ICD-10-CM | POA: Diagnosis not present

## 2021-12-28 DIAGNOSIS — F418 Other specified anxiety disorders: Secondary | ICD-10-CM | POA: Diagnosis not present

## 2021-12-28 DIAGNOSIS — E119 Type 2 diabetes mellitus without complications: Secondary | ICD-10-CM | POA: Diagnosis not present

## 2021-12-28 DIAGNOSIS — E785 Hyperlipidemia, unspecified: Secondary | ICD-10-CM | POA: Diagnosis not present

## 2021-12-28 DIAGNOSIS — M5136 Other intervertebral disc degeneration, lumbar region: Secondary | ICD-10-CM | POA: Diagnosis not present

## 2021-12-28 DIAGNOSIS — I6523 Occlusion and stenosis of bilateral carotid arteries: Secondary | ICD-10-CM | POA: Diagnosis not present

## 2021-12-29 DIAGNOSIS — M797 Fibromyalgia: Secondary | ICD-10-CM | POA: Diagnosis not present

## 2021-12-29 DIAGNOSIS — M542 Cervicalgia: Secondary | ICD-10-CM | POA: Diagnosis not present

## 2021-12-29 DIAGNOSIS — M5459 Other low back pain: Secondary | ICD-10-CM | POA: Diagnosis not present

## 2022-01-04 DIAGNOSIS — G603 Idiopathic progressive neuropathy: Secondary | ICD-10-CM | POA: Diagnosis not present

## 2022-01-04 DIAGNOSIS — M5417 Radiculopathy, lumbosacral region: Secondary | ICD-10-CM | POA: Diagnosis not present

## 2022-01-04 DIAGNOSIS — M5412 Radiculopathy, cervical region: Secondary | ICD-10-CM | POA: Diagnosis not present

## 2022-01-06 DIAGNOSIS — M542 Cervicalgia: Secondary | ICD-10-CM | POA: Diagnosis not present

## 2022-01-06 DIAGNOSIS — M797 Fibromyalgia: Secondary | ICD-10-CM | POA: Diagnosis not present

## 2022-01-06 DIAGNOSIS — M5459 Other low back pain: Secondary | ICD-10-CM | POA: Diagnosis not present

## 2022-01-10 DIAGNOSIS — M5459 Other low back pain: Secondary | ICD-10-CM | POA: Diagnosis not present

## 2022-01-10 DIAGNOSIS — M542 Cervicalgia: Secondary | ICD-10-CM | POA: Diagnosis not present

## 2022-01-10 DIAGNOSIS — M797 Fibromyalgia: Secondary | ICD-10-CM | POA: Diagnosis not present

## 2022-01-12 DIAGNOSIS — M5459 Other low back pain: Secondary | ICD-10-CM | POA: Diagnosis not present

## 2022-01-12 DIAGNOSIS — M797 Fibromyalgia: Secondary | ICD-10-CM | POA: Diagnosis not present

## 2022-01-12 DIAGNOSIS — M542 Cervicalgia: Secondary | ICD-10-CM | POA: Diagnosis not present

## 2022-01-13 DIAGNOSIS — M546 Pain in thoracic spine: Secondary | ICD-10-CM | POA: Diagnosis not present

## 2022-01-19 DIAGNOSIS — M5459 Other low back pain: Secondary | ICD-10-CM | POA: Diagnosis not present

## 2022-01-19 DIAGNOSIS — M542 Cervicalgia: Secondary | ICD-10-CM | POA: Diagnosis not present

## 2022-01-19 DIAGNOSIS — M797 Fibromyalgia: Secondary | ICD-10-CM | POA: Diagnosis not present

## 2022-01-24 DIAGNOSIS — M5459 Other low back pain: Secondary | ICD-10-CM | POA: Diagnosis not present

## 2022-01-24 DIAGNOSIS — M797 Fibromyalgia: Secondary | ICD-10-CM | POA: Diagnosis not present

## 2022-01-24 DIAGNOSIS — M542 Cervicalgia: Secondary | ICD-10-CM | POA: Diagnosis not present

## 2022-01-25 DIAGNOSIS — I6523 Occlusion and stenosis of bilateral carotid arteries: Secondary | ICD-10-CM | POA: Diagnosis not present

## 2022-01-25 DIAGNOSIS — M5136 Other intervertebral disc degeneration, lumbar region: Secondary | ICD-10-CM | POA: Diagnosis not present

## 2022-01-25 DIAGNOSIS — E119 Type 2 diabetes mellitus without complications: Secondary | ICD-10-CM | POA: Diagnosis not present

## 2022-01-25 DIAGNOSIS — F418 Other specified anxiety disorders: Secondary | ICD-10-CM | POA: Diagnosis not present

## 2022-01-31 DIAGNOSIS — M797 Fibromyalgia: Secondary | ICD-10-CM | POA: Diagnosis not present

## 2022-01-31 DIAGNOSIS — M5459 Other low back pain: Secondary | ICD-10-CM | POA: Diagnosis not present

## 2022-01-31 DIAGNOSIS — M542 Cervicalgia: Secondary | ICD-10-CM | POA: Diagnosis not present

## 2022-02-08 DIAGNOSIS — G4733 Obstructive sleep apnea (adult) (pediatric): Secondary | ICD-10-CM | POA: Diagnosis not present

## 2022-02-09 DIAGNOSIS — M5459 Other low back pain: Secondary | ICD-10-CM | POA: Diagnosis not present

## 2022-02-09 DIAGNOSIS — M542 Cervicalgia: Secondary | ICD-10-CM | POA: Diagnosis not present

## 2022-02-09 DIAGNOSIS — M797 Fibromyalgia: Secondary | ICD-10-CM | POA: Diagnosis not present

## 2022-02-22 DIAGNOSIS — E119 Type 2 diabetes mellitus without complications: Secondary | ICD-10-CM | POA: Diagnosis not present

## 2022-02-22 DIAGNOSIS — F418 Other specified anxiety disorders: Secondary | ICD-10-CM | POA: Diagnosis not present

## 2022-02-22 DIAGNOSIS — M797 Fibromyalgia: Secondary | ICD-10-CM | POA: Diagnosis not present

## 2022-02-22 DIAGNOSIS — M5136 Other intervertebral disc degeneration, lumbar region: Secondary | ICD-10-CM | POA: Diagnosis not present

## 2022-02-23 DIAGNOSIS — M5459 Other low back pain: Secondary | ICD-10-CM | POA: Diagnosis not present

## 2022-02-23 DIAGNOSIS — M542 Cervicalgia: Secondary | ICD-10-CM | POA: Diagnosis not present

## 2022-02-23 DIAGNOSIS — M797 Fibromyalgia: Secondary | ICD-10-CM | POA: Diagnosis not present

## 2022-02-26 ENCOUNTER — Emergency Department (HOSPITAL_BASED_OUTPATIENT_CLINIC_OR_DEPARTMENT_OTHER)
Admission: EM | Admit: 2022-02-26 | Discharge: 2022-02-26 | Disposition: A | Discharge status: Home / Self Care | Payer: Commercial Managed Care - HMO | Attending: Emergency Medicine | Admitting: Emergency Medicine

## 2022-02-26 ENCOUNTER — Other Ambulatory Visit: Payer: Self-pay

## 2022-02-26 ENCOUNTER — Emergency Department (HOSPITAL_BASED_OUTPATIENT_CLINIC_OR_DEPARTMENT_OTHER): Payer: Commercial Managed Care - HMO | Admitting: Radiology

## 2022-02-26 ENCOUNTER — Encounter (HOSPITAL_BASED_OUTPATIENT_CLINIC_OR_DEPARTMENT_OTHER): Payer: Self-pay

## 2022-02-26 DIAGNOSIS — R079 Chest pain, unspecified: Secondary | ICD-10-CM | POA: Diagnosis not present

## 2022-02-26 DIAGNOSIS — E119 Type 2 diabetes mellitus without complications: Secondary | ICD-10-CM | POA: Diagnosis not present

## 2022-02-26 DIAGNOSIS — M7989 Other specified soft tissue disorders: Secondary | ICD-10-CM | POA: Insufficient documentation

## 2022-02-26 DIAGNOSIS — M25572 Pain in left ankle and joints of left foot: Secondary | ICD-10-CM | POA: Diagnosis not present

## 2022-02-26 DIAGNOSIS — R0789 Other chest pain: Secondary | ICD-10-CM | POA: Diagnosis not present

## 2022-02-26 DIAGNOSIS — Z794 Long term (current) use of insulin: Secondary | ICD-10-CM | POA: Diagnosis not present

## 2022-02-26 DIAGNOSIS — R202 Paresthesia of skin: Secondary | ICD-10-CM | POA: Insufficient documentation

## 2022-02-26 DIAGNOSIS — R200 Anesthesia of skin: Secondary | ICD-10-CM | POA: Insufficient documentation

## 2022-02-26 LAB — BASIC METABOLIC PANEL
Anion gap: 6 (ref 5–15)
BUN: 11 mg/dL (ref 6–20)
CO2: 30 mmol/L (ref 22–32)
Calcium: 8.8 mg/dL — ABNORMAL LOW (ref 8.9–10.3)
Chloride: 105 mmol/L (ref 98–111)
Creatinine, Ser: 0.81 mg/dL (ref 0.44–1.00)
GFR, Estimated: >60 mL/min (ref >60)
Glucose, Bld: 175 mg/dL — ABNORMAL HIGH (ref 70–99)
Potassium: 4 mmol/L (ref 3.5–5.1)
Sodium: 141 mmol/L (ref 135–145)

## 2022-02-26 LAB — CBC
HCT: 38.1 % (ref 36.0–46.0)
Hemoglobin: 12.4 g/dL (ref 12.0–15.0)
MCH: 28.6 pg (ref 26.0–34.0)
MCHC: 32.5 g/dL (ref 30.0–36.0)
MCV: 88 fL (ref 80.0–100.0)
Platelets: 275 10*3/uL (ref 150–400)
RBC: 4.33 MIL/uL (ref 3.87–5.11)
RDW: 12.6 % (ref 11.5–15.5)
WBC: 5.9 10*3/uL (ref 4.0–10.5)
nRBC: 0 % (ref 0.0–0.2)

## 2022-02-26 LAB — BRAIN NATRIURETIC PEPTIDE: B Natriuretic Peptide: 48.8 pg/mL (ref 0.0–100.0)

## 2022-02-26 LAB — TROPONIN I (HIGH SENSITIVITY): Troponin I (High Sensitivity): 2 ng/L (ref <18)

## 2022-02-26 MED ORDER — DICLOFENAC SODIUM 1 % EX GEL: 4.0000 g | Freq: Four times a day (QID) | CUTANEOUS | 0 refills | Status: DC

## 2022-02-26 NOTE — ED Notes (Signed)
Discharge instructions, follow up care, and prescription reviewed and explained, pt verbalized understanding. Pt reported her pain to be 10/10 on d/c, however pt states she takes multiple pain meds daily for chronic pain and has not yet taken them today and will do so when she gets home. Pt caox4 and ambulatory on d/c.

## 2022-02-26 NOTE — Discharge Instructions (Signed)
Use the gel as prescribed. Also take tylenol '1000mg'$ (2 extra strength) four times a day.   Follow up with your doctor in the office

## 2022-02-26 NOTE — ED Provider Notes (Signed)
[facility] EMERGENCY DEPT Provider Note   CSN: [id number] Arrival date & time: 02/26/22  8016     History  Chief Complaint  Patient presents with   Numbness   Foot Swelling   Chest Pain    Karen Stein is a 51 y.o. female.  51 yo F with a chief complaint of left-sided chest pain.  This feels like an ache and seems to come and go.  She went on a field trip with her grandchild a couple days ago and also had experienced some left leg swelling.  Mostly to the foot and the ankle and was worse with certain positions.  She had some numbness and tingling to her left hand off and on.  She thinks maybe it is worse the past few days.  She denies trauma denies cough congestion or fever denies abdominal pain.  Nothing seems to make this better or worse.  Seems to come and go at random.  Patient denies history of MI, denies hypertension or smoking.  Father had an MI in his 67s.  She has a history of hyperlipidemia and diabetes.   Patient denies history of PE or DVT denies hemoptysis denies unilateral lower extremity edema denies recent surgery immobilization hospitalization estrogen use or history of cancer.     Chest Pain      Home Medications Prior to Admission medications   Medication Sig Start Date End Date Taking? Authorizing Provider  diclofenac Sodium (VOLTAREN) 1 % GEL Apply 4 g topically 4 (four) times daily. 02/26/22  Yes Deno Etienne, DO  atorvastatin (LIPITOR) 20 MG tablet Take 1 tablet (20 mg total) by mouth daily at 6 PM. 08/01/18   Black, Lezlie Octave, NP  ciclopirox (PENLAC) 8 % solution Apply topically. 01/13/21   [provider]  Insulin Aspart, w/Niacinamide, (FIASP) 100 UNIT/ML SOLN Inject 5-16 Units into the skin with breakfast, with lunch, and with evening meal. Per sliding scale 09/11/19   [provider]  lidocaine (LIDODERM) 5 % Place 1 patch onto the skin daily. Remove & Discard patch within 12 hours or as directed by MD 08/23/19    Randal Buba, April, MD  methocarbamol (ROBAXIN) 500 MG tablet Take 1 tablet (500 mg total) by mouth every 8 (eight) hours as needed for muscle spasms. 11/10/20   Maudie Flakes, MD  metoCLOPramide (REGLAN) 5 MG tablet Take 1 tablet (5 mg total) by mouth every 6 (six) hours as needed for nausea. 12/02/19   Robinson, [place] N, PA-C  ondansetron (ZOFRAN ODT) 4 MG disintegrating tablet Take 1 tablet (4 mg total) by mouth every 8 (eight) hours as needed for nausea or vomiting. 01/09/21   Tegeler, Gwenyth Allegra, MD  ondansetron (ZOFRAN ODT) 4 MG disintegrating tablet Take 1 tablet (4 mg total) by mouth every 8 (eight) hours as needed for nausea or vomiting. 03/21/21   Idamae Lusher, MD  Oxycodone HCl 10 MG TABS Take 10 mg by mouth every 6 (six) hours. 11/07/19   [provider]  pregabalin (LYRICA) 200 MG capsule Take 200 mg by mouth 3 (three) times daily. 01/27/21   [provider]  Semaglutide,0.25 or 0.'5MG'$ /DOS, (OZEMPIC, 0.25 OR 0.5 MG/DOSE,) 2 MG/1.5ML SOPN Inject 0.4 mLs into the skin once a week. 09/11/19   [provider]      Allergies    Amoxicillin, Amoxicillin-pot clavulanate, Macrobid [nitrofurantoin macrocrystal], Rosuvastatin, Shellfish allergy, Sulfa antibiotics, and Nitrofurantoin    Review of Systems   Review of Systems  Cardiovascular:  Positive for  chest pain.    Physical Exam Updated Vital Signs BP (!) 163/87 (BP Location: Left Arm)   Pulse 76   Temp 98.1 F (36.7 C) (Oral)   Resp 16   Ht '5\' 8"'$  (1.727 m)   Wt 106.6 kg   LMP 11/13/2015 (Exact Date)   SpO2 100%   BMI 35.73 kg/m  Physical Exam Vitals and nursing note reviewed.  Constitutional:      General: She is not in acute distress.    Appearance: She is well-developed. She is not diaphoretic.  HENT:     Head: Normocephalic and atraumatic.  Eyes:     Pupils: Pupils are equal, round, and reactive to light.  Cardiovascular:     Rate and Rhythm: Normal rate and regular rhythm.     Heart sounds:  No murmur heard.    No friction rub. No gallop.  Pulmonary:     Effort: Pulmonary effort is normal.     Breath sounds: No wheezing or rales.  Chest:     Chest wall: Tenderness present.     Comments: Pain on palpation of the left chest wall about the midclavicular line about ribs 4 through 6 reproduces her tenderness Abdominal:     General: There is no distension.     Palpations: Abdomen is soft.     Tenderness: There is no abdominal tenderness.  Musculoskeletal:        General: No tenderness.     Cervical back: Normal range of motion and neck supple.     Comments: Pulse motor and sensation intact in the left upper extremity.  No weakness.  She does have a positive Tinel's test with percussion over the median canal.  She does have some left foot and ankle swelling.  Some mild tenderness about the ATF.  No significant upper leg edema.  Pulse motor and sensation intact to the left lower extremity.  Skin:    General: Skin is warm and dry.  Neurological:     Mental Status: She is alert and oriented to person, place, and time.  Psychiatric:        Behavior: Behavior normal.     ED Results / Procedures / Treatments   Labs (all labs ordered are listed, but only abnormal results are displayed) Labs Reviewed  BASIC METABOLIC PANEL - Abnormal; Notable for the following components:      Result Value   Glucose, Bld 175 (*)    Calcium 8.8 (*)    All other components within normal limits  CBC  BRAIN NATRIURETIC PEPTIDE  TROPONIN I (HIGH SENSITIVITY)    EKG EKG Interpretation  Date/Time:  Sunday February 26 2022 08:29:03 EDT Ventricular Rate:  80 PR Interval:  161 QRS Duration: 106 QT Interval:  383 QTC Calculation: 442 R Axis:   -38 Text Interpretation: Sinus rhythm Probable left atrial enlargement Left axis deviation No significant change since last tracing Confirmed by Deno Etienne [phone]) on 02/26/2022 9:20:20 AM  Radiology DG Ankle Complete Left  Result Date:  02/26/2022 CLINICAL DATA:  Pain and swelling for a couple of weeks. EXAM: LEFT ANKLE COMPLETE - 3+ VIEW COMPARISON:  None Available. FINDINGS: Diffuse swelling identified. No fracture or dislocation. No significant bony abnormalities. IMPRESSION: Diffuse soft tissue swelling.  No other abnormalities. Electronically Signed   By: Dorise Bullion III M.D.   On: 02/26/2022 09:18   DG Chest 2 View  Result Date: 02/26/2022 CLINICAL DATA:  Intermittent chest pain and left arm numbness. EXAM: CHEST - 2 VIEW  COMPARISON:  11/25/2021. FINDINGS: Cardiac silhouette is normal in size. Normal mediastinal and hilar contours. Clear lungs.  No pleural effusion or pneumothorax. Skeletal structures are intact.  Stable spine stimulator leads. IMPRESSION: No active cardiopulmonary disease. Electronically Signed   By: Lajean Manes M.D.   On: 02/26/2022 09:17    Procedures Procedures    Medications Ordered in ED Medications - No data to display  ED Course/ Medical Decision Making/ A&P                           Medical Decision Making Amount and/or Complexity of Data Reviewed Labs: ordered. Radiology: ordered.   51 yo F here with multiple complaints.  Her chief complaint is that she is having left-sided chest pain.  This been going on for weeks.  Seems to come and go at random.  Is reproduced on exam.  Most likely musculoskeletal.  With length of symptoms I feel a single troponin should be able to rule her out for MI.    Patient also complaining of left arm tingling.  She has a history of carpal tunnel and it is partially reproduced with physical exam maneuvers.  Encouraged her to follow-up with her family doctor.  She is also complaining of left foot and ankle swelling.  Been going on for couple days after she went on a field trip where she walked more than normal.  We will obtain a plain film but low suspicion for fracture.  Is able to ambulate without issue.  Perhaps a strain or sprain.  Plain film of the  left ankle independently interpreted by me without fracture.  Plain film of the chest independently interpreted by me without focal infiltrate or pneumothorax.  Troponin negative BNP negative.  No significant electrolyte abnormality no anemia.  Will discharge home.  10:05 AM:  I have discussed the diagnosis/risks/treatment options with the patient and family.  Evaluation and diagnostic testing in the emergency department does not suggest an emergent condition requiring admission or immediate intervention beyond what has been performed at this time.  They will follow up with PCP. We also discussed returning to the ED immediately if new or worsening sx occur. We discussed the sx which are most concerning (e.g., sudden worsening pain, fever, inability to tolerate by mouth) that necessitate immediate return. Medications administered to the patient during their visit and any new prescriptions provided to the patient are listed below.  Medications given during this visit Medications - No data to display   The patient appears reasonably screen and/or stabilized for discharge and I doubt any other medical condition or other [facility] requiring further screening, evaluation, or treatment in the ED at this time prior to discharge.          Final Clinical Impression(s) / ED Diagnoses Final diagnoses:  Nonspecific chest pain    Rx / DC Orders ED Discharge Orders          Ordered    diclofenac Sodium (VOLTAREN) 1 % GEL  4 times daily        02/26/22 [place], Anella Nakata, DO 02/26/22 1005

## 2022-02-26 NOTE — ED Triage Notes (Signed)
Pt states for the past couple of weeks she has been having intermittent chest pain and numbness to her left arm. She states [place] at [place] with her grandchild this Friday, and has had noticeable swelling to both feet. Pt denies sob. AxOx4. She also states she has been having intermittent pain in her head.

## 2022-02-28 DIAGNOSIS — M542 Cervicalgia: Secondary | ICD-10-CM | POA: Diagnosis not present

## 2022-02-28 DIAGNOSIS — M5459 Other low back pain: Secondary | ICD-10-CM | POA: Diagnosis not present

## 2022-02-28 DIAGNOSIS — M797 Fibromyalgia: Secondary | ICD-10-CM | POA: Diagnosis not present

## 2022-03-26 ENCOUNTER — Ambulatory Visit (INDEPENDENT_AMBULATORY_CARE_PROVIDER_SITE_OTHER): Payer: Medicare Other

## 2022-03-26 ENCOUNTER — Ambulatory Visit
Admission: EM | Admit: 2022-03-26 | Discharge: 2022-03-26 | Disposition: A | Discharge status: Home / Self Care | Payer: Commercial Managed Care - HMO | Attending: Urgent Care | Admitting: Urgent Care

## 2022-03-26 DIAGNOSIS — J4530 Mild persistent asthma, uncomplicated: Secondary | ICD-10-CM | POA: Diagnosis present

## 2022-03-26 DIAGNOSIS — B974 Respiratory syncytial virus as the cause of diseases classified elsewhere: Secondary | ICD-10-CM | POA: Insufficient documentation

## 2022-03-26 DIAGNOSIS — J069 Acute upper respiratory infection, unspecified: Secondary | ICD-10-CM | POA: Insufficient documentation

## 2022-03-26 DIAGNOSIS — Z20828 Contact with and (suspected) exposure to other viral communicable diseases: Secondary | ICD-10-CM | POA: Insufficient documentation

## 2022-03-26 DIAGNOSIS — R0602 Shortness of breath: Secondary | ICD-10-CM | POA: Diagnosis not present

## 2022-03-26 DIAGNOSIS — Z91148 Patient's other noncompliance with medication regimen for other reason: Secondary | ICD-10-CM | POA: Insufficient documentation

## 2022-03-26 DIAGNOSIS — Z1152 Encounter for screening for COVID-19: Secondary | ICD-10-CM | POA: Insufficient documentation

## 2022-03-26 DIAGNOSIS — B349 Viral infection, unspecified: Secondary | ICD-10-CM | POA: Diagnosis present

## 2022-03-26 DIAGNOSIS — Z794 Long term (current) use of insulin: Secondary | ICD-10-CM | POA: Diagnosis not present

## 2022-03-26 DIAGNOSIS — R059 Cough, unspecified: Secondary | ICD-10-CM | POA: Diagnosis not present

## 2022-03-26 DIAGNOSIS — F1290 Cannabis use, unspecified, uncomplicated: Secondary | ICD-10-CM

## 2022-03-26 DIAGNOSIS — E119 Type 2 diabetes mellitus without complications: Secondary | ICD-10-CM | POA: Insufficient documentation

## 2022-03-26 LAB — RESP PANEL BY RT-PCR (RSV, FLU A&B, COVID)  RVPGX2
Influenza A by PCR: NEGATIVE
Influenza B by PCR: NEGATIVE
Resp Syncytial Virus by PCR: POSITIVE — AB
SARS Coronavirus 2 by RT PCR: NEGATIVE

## 2022-03-26 LAB — POCT FASTING CBG KUC MANUAL ENTRY: POCT Glucose (KUC): 404 mg/dL — AB (ref 70–99)

## 2022-03-26 MED ORDER — ALBUTEROL SULFATE HFA 108 (90 BASE) MCG/ACT IN AERS: 1.0000 | INHALATION_SPRAY | Freq: Four times a day (QID) | RESPIRATORY_TRACT | 0 refills | Status: DC | PRN

## 2022-03-26 MED ORDER — PROMETHAZINE-DM 6.25-15 MG/5ML PO SYRP: 5.0000 mL | ORAL_SOLUTION | Freq: Three times a day (TID) | ORAL | 0 refills | Status: DC | PRN

## 2022-03-26 MED ORDER — BENZONATATE 100 MG PO CAPS: 100.0000 mg | ORAL_CAPSULE | Freq: Three times a day (TID) | ORAL | 0 refills | Status: DC | PRN

## 2022-03-26 NOTE — ED Provider Notes (Signed)
[facility]  Note:  This document was prepared using Systems analyst and may include unintentional dictation errors.  MRN: [id number] DOB: March 26, 1971  Subjective:   Karen Stein is a 51 y.o. female presenting for 5 day history of acute onset runny nose, body aches, coughing, malaise and fatigue, chest discomfort. Had exposure to RSV. Has asthma, no inhalers at the moment.  Patient smokes marijuana on occasion but not daily.  Has type 2 diabetes treated with insulin.  Reports that she has not been compliant with her insulin.  No current facility-administered medications for this encounter.  Current Outpatient Medications:    atorvastatin (LIPITOR) 20 MG tablet, Take 1 tablet (20 mg total) by mouth daily at 6 PM., Disp: 30 tablet, Rfl: 1   ciclopirox (PENLAC) 8 % solution, Apply topically., Disp: , Rfl:    diclofenac Sodium (VOLTAREN) 1 % GEL, Apply 4 g topically 4 (four) times daily., Disp: 100 g, Rfl: 0   Insulin Aspart, w/Niacinamide, (FIASP) 100 UNIT/ML SOLN, Inject 5-16 Units into the skin with breakfast, with lunch, and with evening meal. Per sliding scale, Disp: , Rfl:    lidocaine (LIDODERM) 5 %, Place 1 patch onto the skin daily. Remove & Discard patch within 12 hours or as directed by MD, Disp: 30 patch, Rfl: 0   methocarbamol (ROBAXIN) 500 MG tablet, Take 1 tablet (500 mg total) by mouth every 8 (eight) hours as needed for muscle spasms., Disp: 30 tablet, Rfl: 0   metoCLOPramide (REGLAN) 5 MG tablet, Take 1 tablet (5 mg total) by mouth every 6 (six) hours as needed for nausea., Disp: 15 tablet, Rfl: 0   ondansetron (ZOFRAN ODT) 4 MG disintegrating tablet, Take 1 tablet (4 mg total) by mouth every 8 (eight) hours as needed for nausea or vomiting., Disp: 14 tablet, Rfl: 0   ondansetron (ZOFRAN ODT) 4 MG disintegrating tablet, Take 1 tablet (4 mg total) by mouth every 8 (eight) hours as needed for nausea or vomiting., Disp: 20 tablet, Rfl:  0   Oxycodone HCl 10 MG TABS, Take 10 mg by mouth every 6 (six) hours., Disp: , Rfl:    pregabalin (LYRICA) 200 MG capsule, Take 200 mg by mouth 3 (three) times daily., Disp: , Rfl:    Semaglutide,0.25 or 0.'5MG'$ /DOS, (OZEMPIC, 0.25 OR 0.5 MG/DOSE,) 2 MG/1.5ML SOPN, Inject 0.4 mLs into the skin once a week., Disp: , Rfl:    Allergies  Allergen Reactions   Amoxicillin    Amoxicillin-Pot Clavulanate Nausea And Vomiting   Macrobid [Nitrofurantoin Macrocrystal] Nausea Only    Headache and "severe abd cramps"   Rosuvastatin Nausea And Vomiting   Shellfish Allergy Swelling    Itching of lips and ears, NOT allergic to iodine contrast   Sulfa Antibiotics Other (See Comments)    Dizzy and sees spots with sulfa drugs   Nitrofurantoin Other (See Comments), Itching, Nausea Only and Photosensitivity    "seeing spots" Sees "white spots"     Past Medical History:  Diagnosis Date   Anemia    Anxiety    Arthritis    Chronic pain    Complication of anesthesia    anesthesia awareness,   DDD (degenerative disc disease)    DDD (degenerative disc disease)    Degenerative disc disease    Degenerative disc disease    Diabetes mellitus    Type 2 insulin resistant   Dysrhythmia    stress and caffeine related   Endometriosis    Fibroids  Fibromyalgia    GERD (gastroesophageal reflux disease)    High cholesterol    Insomnia    Leg cramps    PCOS (polycystic ovarian syndrome)    Scoliosis    Sickle cell trait ([place])      Past Surgical History:  Procedure Laterality Date   BACK SURGERY  2010   BIOPSY BREAST     CESAREAN SECTION     DILATATION & CURETTAGE/HYSTEROSCOPY WITH MYOSURE N/A 01/01/2015   Procedure: DILATATION & CURETTAGE/HYSTEROSCOPY WITH MYOSURE;  Surgeon: Crawford Givens, MD;  Location: [facility];  Service: Gynecology;  Laterality: N/A;   LAPAROSCOPY  2002 or 2003   TOE SURGERY  2008   WISDOM TOOTH EXTRACTION      Family History  Problem Relation Age of Onset   Hyperlipidemia  Mother    Heart attack Father 20   Diabetes Father    Hypertension Father    Hyperlipidemia Father    Kidney failure Father    Heart murmur Brother    Alzheimer's disease Maternal Grandmother    Brain cancer Maternal Grandfather    Diabetes Paternal Grandmother    Hypertension Paternal Grandmother    Hyperlipidemia Paternal Grandmother    Diabetes Paternal Grandfather    Hypertension Paternal Grandfather    Hyperlipidemia Paternal Grandfather     Social History   Tobacco Use   Smoking status: Never   Smokeless tobacco: Never  Vaping Use   Vaping Use: Never used  Substance Use Topics   Alcohol use: Yes    Comment: occ   Drug use: Yes    Types: Marijuana    ROS   Objective:   Vitals: BP 136/83 (BP Location: Left Arm)   Pulse 94   Temp 99.1 F (37.3 C) (Oral)   Resp 20   LMP 11/13/2015 (Exact Date)   SpO2 97%   Physical Exam Constitutional:      General: She is not in acute distress.    Appearance: Normal appearance. She is well-developed. She is ill-appearing. She is not toxic-appearing or diaphoretic.  HENT:     Head: Normocephalic and atraumatic.     Nose: Nose normal.     Mouth/Throat:     Mouth: Mucous membranes are moist.  Eyes:     General: No scleral icterus.       Right eye: No discharge.        Left eye: No discharge.     Extraocular Movements: Extraocular movements intact.  Cardiovascular:     Rate and Rhythm: Normal rate and regular rhythm.     Heart sounds: Normal heart sounds. No murmur heard.    No friction rub. No gallop.  Pulmonary:     Effort: Pulmonary effort is normal. No respiratory distress.     Breath sounds: No stridor. No wheezing, rhonchi or rales.  Chest:     Chest wall: No tenderness.  Skin:    General: Skin is warm and dry.  Neurological:     General: No focal deficit present.     Mental Status: She is alert and oriented to person, place, and time.  Psychiatric:        Mood and Affect: Mood normal.        Behavior:  Behavior normal.    Results for orders placed or performed during the hospital encounter of 03/26/22 (from the past 24 hour(s))  POCT CBG (manual entry)     Status: Abnormal   Collection Time: 03/26/22  2:15 PM  Result Value Ref Range  POCT Glucose ([facility]) 404 (A) 70 - 99 mg/dL    DG Chest 2 View  Result Date: 03/26/2022 CLINICAL DATA:  Shortness of breath and cough. EXAM: CHEST - 2 VIEW COMPARISON:  02/26/2022 FINDINGS: The cardiac silhouette, mediastinal and hilar contours are within normal limits and stable. No acute pulmonary findings. No pleural effusion. The bony thorax is intact. Stable thoracic spinal cord stimulator. IMPRESSION: No acute cardiopulmonary findings. Electronically Signed   By: Marijo Sanes M.D.   On: 03/26/2022 14:29     Assessment and Plan :   PDMP not reviewed this encounter.  1. Acute viral syndrome   2. RSV exposure   3. Mild persistent asthma without complication   4. Marijuana use   5. Type 2 diabetes mellitus treated with insulin (HCC)     Chest x-ray negative. Respiratory panel pending. Will manage for viral illness such as viral URI, viral syndrome, viral rhinitis, COVID-19, influenza, RSV. Recommended supportive care. Offered scripts for symptomatic relief. Testing is pending. Counseled patient on potential for adverse effects with medications prescribed/recommended today, ER and return-to-clinic precautions discussed, patient verbalized understanding.     Jaynee Eagles, PA-C 03/26/22 1440

## 2022-03-26 NOTE — ED Triage Notes (Signed)
Pt c/o cough, runny nose, body aches x 5 days-reports exposure to RSV-NAD-slow gait with own cane

## 2022-03-27 ENCOUNTER — Other Ambulatory Visit: Payer: Self-pay

## 2022-03-27 ENCOUNTER — Emergency Department (HOSPITAL_COMMUNITY)
Admission: EM | Admit: 2022-03-27 | Discharge: 2022-03-28 | Disposition: A | Discharge status: Home / Self Care | Payer: Commercial Managed Care - HMO | Attending: Emergency Medicine | Admitting: Emergency Medicine

## 2022-03-27 DIAGNOSIS — R109 Unspecified abdominal pain: Secondary | ICD-10-CM | POA: Insufficient documentation

## 2022-03-27 DIAGNOSIS — E1165 Type 2 diabetes mellitus with hyperglycemia: Secondary | ICD-10-CM | POA: Insufficient documentation

## 2022-03-27 DIAGNOSIS — R739 Hyperglycemia, unspecified: Secondary | ICD-10-CM | POA: Diagnosis present

## 2022-03-27 DIAGNOSIS — Z794 Long term (current) use of insulin: Secondary | ICD-10-CM | POA: Diagnosis not present

## 2022-03-27 DIAGNOSIS — R112 Nausea with vomiting, unspecified: Secondary | ICD-10-CM

## 2022-03-27 DIAGNOSIS — R000 Tachycardia, unspecified: Secondary | ICD-10-CM | POA: Insufficient documentation

## 2022-03-27 DIAGNOSIS — R509 Fever, unspecified: Secondary | ICD-10-CM | POA: Insufficient documentation

## 2022-03-27 LAB — CBC
HCT: 44.4 % (ref 36.0–46.0)
Hemoglobin: 14.7 g/dL (ref 12.0–15.0)
MCH: 28.7 pg (ref 26.0–34.0)
MCHC: 33.1 g/dL (ref 30.0–36.0)
MCV: 86.5 fL (ref 80.0–100.0)
Platelets: 287 10*3/uL (ref 150–400)
RBC: 5.13 MIL/uL — ABNORMAL HIGH (ref 3.87–5.11)
RDW: 12.6 % (ref 11.5–15.5)
WBC: 7.6 10*3/uL (ref 4.0–10.5)
nRBC: 0 % (ref 0.0–0.2)

## 2022-03-27 LAB — BLOOD GAS, VENOUS
Acid-Base Excess: 6.1 mmol/L — ABNORMAL HIGH (ref 0.0–2.0)
Bicarbonate: 31.2 mmol/L — ABNORMAL HIGH (ref 20.0–28.0)
O2 Saturation: 42 %
Patient temperature: 37
pCO2, Ven: 46 mmHg (ref 44–60)
pH, Ven: 7.44 — ABNORMAL HIGH (ref 7.25–7.43)
pO2, Ven: <31 mmHg — CL (ref 32–45)

## 2022-03-27 LAB — URINALYSIS, ROUTINE W REFLEX MICROSCOPIC
Bacteria, UA: NONE SEEN
Bilirubin Urine: NEGATIVE
Glucose, UA: >=500 mg/dL — AB
Hgb urine dipstick: NEGATIVE
Ketones, ur: 20 mg/dL — AB
Leukocytes,Ua: NEGATIVE
Nitrite: NEGATIVE
Protein, ur: 30 mg/dL — AB
Specific Gravity, Urine: 1.012 (ref 1.005–1.030)
pH: 6 (ref 5.0–8.0)

## 2022-03-27 LAB — CBG MONITORING, ED: Glucose-Capillary: 327 mg/dL — ABNORMAL HIGH (ref 70–99)

## 2022-03-27 LAB — BASIC METABOLIC PANEL
Anion gap: 13 (ref 5–15)
BUN: 13 mg/dL (ref 6–20)
CO2: 24 mmol/L (ref 22–32)
Calcium: 8.6 mg/dL — ABNORMAL LOW (ref 8.9–10.3)
Chloride: 97 mmol/L — ABNORMAL LOW (ref 98–111)
Creatinine, Ser: 1.12 mg/dL — ABNORMAL HIGH (ref 0.44–1.00)
GFR, Estimated: 59 mL/min — ABNORMAL LOW (ref >60)
Glucose, Bld: 345 mg/dL — ABNORMAL HIGH (ref 70–99)
Potassium: 4.7 mmol/L (ref 3.5–5.1)
Sodium: 134 mmol/L — ABNORMAL LOW (ref 135–145)

## 2022-03-27 LAB — PREGNANCY, URINE: Preg Test, Ur: NEGATIVE

## 2022-03-27 MED ORDER — ACETAMINOPHEN 500 MG PO TABS
1000.0000 mg | ORAL_TABLET | Freq: Once | ORAL | Status: AC
  Administered 2022-03-27: 1000 mg via ORAL
  Filled 2022-03-27: qty 2

## 2022-03-27 MED ORDER — METOCLOPRAMIDE HCL 5 MG/ML IJ SOLN
10.0000 mg | Freq: Once | INTRAMUSCULAR | Status: AC
  Administered 2022-03-27: 10 mg via INTRAVENOUS
  Filled 2022-03-27: qty 2

## 2022-03-27 MED ORDER — MORPHINE SULFATE (PF) 4 MG/ML IV SOLN: 4.0000 mg | Freq: Once | INTRAVENOUS | Status: DC

## 2022-03-27 MED ORDER — LACTATED RINGERS IV BOLUS
1000.0000 mL | Freq: Once | INTRAVENOUS | Status: AC
  Administered 2022-03-27: 1000 mL via INTRAVENOUS

## 2022-03-27 MED ORDER — SODIUM CHLORIDE 0.9 % IV BOLUS
1000.0000 mL | Freq: Once | INTRAVENOUS | Status: AC
  Administered 2022-03-27: 1000 mL via INTRAVENOUS

## 2022-03-27 MED ORDER — MORPHINE SULFATE (PF) 2 MG/ML IV SOLN
2.0000 mg | Freq: Once | INTRAVENOUS | Status: AC
  Administered 2022-03-27: 2 mg via INTRAVENOUS
  Filled 2022-03-27: qty 1

## 2022-03-27 NOTE — ED Provider Notes (Signed)
Ultrasound ED Peripheral IV (Provider)  Date/Time: 03/27/2022 9:51 PM  Performed by: Tedd Sias, PA Authorized by: Tedd Sias, PA   Procedure details:    Indications: poor IV access     Skin Prep: chlorhexidine gluconate     Angiocath:  20 G   Bedside Ultrasound Guided: Yes     Images: not archived     Patient tolerated procedure without complications: Yes     Dressing applied: Yes    I also evaluated this pt with PA Ariel Z.    Tedd Sias, [place] 03/27/22 2151    Tegeler, Gwenyth Allegra, MD 03/27/22 [phone]

## 2022-03-27 NOTE — ED Triage Notes (Signed)
Patient BIB EMS for hyperglycemia.  Reports she was seen here yesterday for respiratory issues.  States this morning her "Dexcom" told her that her blood sugar was "at zero."  She took mango juice, dark soda, and light soda to increase blood glucose.  Has been vomiting since this morning due to "crashing."  Has insulin pump currently.  States "it is on, but I am not sure if it is giving me any insulin."  Last blood glucose for EMS was 310.

## 2022-03-27 NOTE — ED Provider Notes (Signed)
[facility] DEPT Provider Note   CSN: [id number] Arrival date & time: 03/27/22  1944     History Chief Complaint  Patient presents with   Hyperglycemia    EVOLET SALMINEN is a 51 y.o. female.   Hyperglycemia Associated symptoms: abdominal pain, fatigue, fever, nausea, vomiting and weakness   Associated symptoms: no chest pain, no dysuria and no shortness of breath   Patient presents for complaints of abdominal pain and nausea. Patient reportedly had a CBG read of 0 this morning and believed it was her insulin pump malfunctioning so she turned it off and consumed large amounts of sugary drinks. Since then, patient reports 6-7 episodes of non-bloody non-bilious vomiting, abdominal pain, dry mouth, polydipsia, increased urine output, drowsiness, and feeling unwell which may be confounded by testing positive for RSV on 03/26/2022 based on urgent care note. Urgent care note from yesterday made note that patient was poorly/noncompliant with insulin use in managing her type 2 diabetes.    Home Medications Prior to Admission medications   Medication Sig Start Date End Date Taking? Authorizing Provider  albuterol (VENTOLIN HFA) 108 (90 Base) MCG/ACT inhaler Inhale 1-2 puffs into the lungs every 6 (six) hours as needed for wheezing or shortness of breath. 03/26/22   Jaynee Eagles, PA-C  atorvastatin (LIPITOR) 20 MG tablet Take 1 tablet (20 mg total) by mouth daily at 6 PM. 08/01/18   Black, Lezlie Octave, NP  benzonatate (TESSALON) 100 MG capsule Take 1 capsule (100 mg total) by mouth 3 (three) times daily as needed for cough. 03/26/22   Jaynee Eagles, PA-C  ciclopirox (PENLAC) 8 % solution Apply topically. 01/13/21   [provider]  diclofenac Sodium (VOLTAREN) 1 % GEL Apply 4 g topically 4 (four) times daily. 02/26/22   Deno Etienne, DO  Insulin Aspart, w/Niacinamide, (FIASP) 100 UNIT/ML SOLN Inject 5-16 Units into the skin with breakfast, with lunch, and with evening  meal. Per sliding scale 09/11/19   [provider]  lidocaine (LIDODERM) 5 % Place 1 patch onto the skin daily. Remove & Discard patch within 12 hours or as directed by MD 08/23/19   Randal Buba, April, MD  methocarbamol (ROBAXIN) 500 MG tablet Take 1 tablet (500 mg total) by mouth every 8 (eight) hours as needed for muscle spasms. 11/10/20   Maudie Flakes, MD  metoCLOPramide (REGLAN) 5 MG tablet Take 1 tablet (5 mg total) by mouth every 6 (six) hours as needed for nausea. 12/02/19   Robinson, [place] N, PA-C  ondansetron (ZOFRAN ODT) 4 MG disintegrating tablet Take 1 tablet (4 mg total) by mouth every 8 (eight) hours as needed for nausea or vomiting. 01/09/21   Tegeler, Gwenyth Allegra, MD  ondansetron (ZOFRAN ODT) 4 MG disintegrating tablet Take 1 tablet (4 mg total) by mouth every 8 (eight) hours as needed for nausea or vomiting. 03/21/21   Idamae Lusher, MD  Oxycodone HCl 10 MG TABS Take 10 mg by mouth every 6 (six) hours. 11/07/19   [provider]  pregabalin (LYRICA) 200 MG capsule Take 200 mg by mouth 3 (three) times daily. 01/27/21   [provider]  promethazine-dextromethorphan (PROMETHAZINE-DM) 6.25-15 MG/5ML syrup Take 5 mLs by mouth 3 (three) times daily as needed for cough. 03/26/22   Jaynee Eagles, PA-C  Semaglutide,0.25 or 0.'5MG'$ /DOS, (OZEMPIC, 0.25 OR 0.5 MG/DOSE,) 2 MG/1.5ML SOPN Inject 0.4 mLs into the skin once a week. 09/11/19   [provider]      Allergies    Amoxicillin,  Amoxicillin-pot clavulanate, Macrobid [nitrofurantoin macrocrystal], Rosuvastatin, Shellfish allergy, Sulfa antibiotics, and Nitrofurantoin    Review of Systems   Review of Systems  Constitutional:  Positive for fatigue and fever.  Respiratory:  Negative for chest tightness and shortness of breath.   Cardiovascular:  Negative for chest pain.  Gastrointestinal:  Positive for abdominal pain, nausea and vomiting.  Genitourinary:  Positive for decreased urine volume and frequency.  Negative for dysuria.  Musculoskeletal:  Positive for myalgias.  Neurological:  Positive for weakness and light-headedness.    Physical Exam Updated Vital Signs BP (!) 204/128 (BP Location: Left Arm)   Pulse (!) 118   Temp 99.3 F (37.4 C) (Oral)   Resp 20   Ht '5\' 8"'$  (1.727 m)   Wt 108.9 kg   LMP 11/13/2015 (Exact Date)   SpO2 98%   BMI 36.49 kg/m  Physical Exam Constitutional:      Appearance: She is obese. She is ill-appearing.  HENT:     Head: Normocephalic and atraumatic.     Nose: Nose normal.     Mouth/Throat:     Mouth: Mucous membranes are dry.  Cardiovascular:     Rate and Rhythm: Regular rhythm. Tachycardia present.     Heart sounds: Normal heart sounds.  Pulmonary:     Effort: Pulmonary effort is normal. No respiratory distress.     Breath sounds: Normal breath sounds.  Abdominal:     General: Bowel sounds are normal.     Tenderness: There is abdominal tenderness.  Musculoskeletal:        General: No swelling.  Skin:    General: Skin is warm and dry.     Capillary Refill: Capillary refill takes 2 to 3 seconds.  Psychiatric:     Comments: Patient cooperative but appears visibly fatigued and borderline somnolent.     ED Results / Procedures / Treatments   Labs (all labs ordered are listed, but only abnormal results are displayed) Labs Reviewed  CBC - Abnormal; Notable for the following components:      Result Value   RBC 5.13 (*)    All other components within normal limits  URINALYSIS, ROUTINE W REFLEX MICROSCOPIC - Abnormal; Notable for the following components:   Glucose, UA >=500 (*)    Ketones, ur 20 (*)    Protein, ur 30 (*)    All other components within normal limits  CBG MONITORING, ED - Abnormal; Notable for the following components:   Glucose-Capillary 327 (*)    All other components within normal limits  BASIC METABOLIC PANEL  BLOOD GAS, VENOUS  PREGNANCY, URINE    EKG None  Radiology DG Chest 2 View  Result Date:  03/26/2022 CLINICAL DATA:  Shortness of breath and cough. EXAM: CHEST - 2 VIEW COMPARISON:  02/26/2022 FINDINGS: The cardiac silhouette, mediastinal and hilar contours are within normal limits and stable. No acute pulmonary findings. No pleural effusion. The bony thorax is intact. Stable thoracic spinal cord stimulator. IMPRESSION: No acute cardiopulmonary findings. Electronically Signed   By: Marijo Sanes M.D.   On: 03/26/2022 14:29    Procedures Procedures   Medications Ordered in ED Medications  morphine (PF) 2 MG/ML injection 2 mg (has no administration in time range)  acetaminophen (TYLENOL) tablet 1,000 mg (1,000 mg Oral Given 03/27/22 2200)  sodium chloride 0.9 % bolus 1,000 mL (1,000 mLs Intravenous New Bag/Given 03/27/22 2201)  metoCLOPramide (REGLAN) injection 10 mg (10 mg Intravenous Given 03/27/22 2200)    ED Course/ Medical Decision Making/  A&P                           Medical Decision Making Amount and/or Complexity of Data Reviewed Labs: ordered.  Risk OTC drugs.   This patient presents to the ED for concern of hyperglycemia, this involves an extensive number of treatment options, and is a complaint that carries with it a high risk of complications and morbidity.  The differential diagnosis includes DKA, sepsis, HHS, gastroenteritis, and stress response.   Co morbidities that complicate the patient evaluation  Type 2 diabetes with insulin, history of DKA,    Additional history obtained:  External records from outside source obtained and reviewed including ED to hospital admission for DKA in 09//27/2020.   Lab Tests:  I Ordered, and personally interpreted labs. The pertinent results include: glucose of 327 on intake.    Problem List / ED Course / Critical interventions / Medication management  Patient presented to the ER with concerns of hyperglycemia, nausea, vomiting, and abdominal pain. Patient states that she woke up this morning with CBG showing 0  which caused her to disconnect insulin pump and ingest large amounts of sugary drinks. Soon afterwards, patient began vomiting and reports vomiting 6-7 times today. Per UC note from 03/26/2022, patient was diagnosed with RSV and noted to be noncompliant with insulin for diabetes management. At this time, disposition is not known as labs are still pending. I ordered medication including tylenol, morphine, and reglan for abdominal pain, nausea, and vomiting  I have reviewed the patients home medicines and have made adjustments as needed  10:20 PM Care of Belva Agee @ transferred to [facility] and Dr. Sherry Ruffing at the end of my shift as the patient will require reassessment once labs/imaging have resulted. Patient presentation, ED course, and plan of care discussed with review of all pertinent labs and imaging. Please see his/her note for further details regarding further ED course and disposition. Plan at time of handoff is continue IVF, pain management, and potential DKA management pending DKA lab workup. This may be altered or completely changed at the discretion of the oncoming team pending results of further workup.   Final Clinical Impression(s) / ED Diagnoses Final diagnoses:  Nausea and vomiting, unspecified vomiting type    Rx / DC Orders ED Discharge Orders     None         Vladimir Creeks 03/27/22 2221    Tegeler, Gwenyth Allegra, MD 03/27/22 2311

## 2022-03-27 NOTE — ED Provider Notes (Signed)
Patient here with elevated glucose.  States that her insulin pump read 0 and then she drank a bunch of sugary drinks.  11:17 PM Reassessed.  Not vomiting now.  Continue fluids.  Recheck VBG after fluids.  BP noted to be elevated.  Patient states she doesn't taking anything for her BP.  Will have her follow-up with PCP for this.  Vitals improving with fluids.  She is advised to follow-up with her doctor.  No evidence of DKA.   Montine Circle, PA-C 03/28/22 0139    Maudie Flakes, MD 03/28/22 [phone]

## 2022-03-28 DIAGNOSIS — E1165 Type 2 diabetes mellitus with hyperglycemia: Secondary | ICD-10-CM | POA: Diagnosis not present

## 2022-03-28 LAB — BLOOD GAS, VENOUS
Acid-Base Excess: 2.6 mmol/L — ABNORMAL HIGH (ref 0.0–2.0)
Bicarbonate: 27.2 mmol/L (ref 20.0–28.0)
O2 Saturation: 79.1 %
Patient temperature: 37
pCO2, Ven: 41 mmHg — ABNORMAL LOW (ref 44–60)
pH, Ven: 7.43 (ref 7.25–7.43)
pO2, Ven: 45 mmHg (ref 32–45)

## 2022-03-28 MED ORDER — ONDANSETRON HCL 4 MG/2ML IJ SOLN
4.0000 mg | Freq: Once | INTRAMUSCULAR | Status: AC
  Administered 2022-03-28: 4 mg via INTRAVENOUS
  Filled 2022-03-28: qty 2

## 2022-03-28 MED ORDER — ONDANSETRON 4 MG PO TBDP: 4.0000 mg | ORAL_TABLET | Freq: Three times a day (TID) | ORAL | 0 refills | Status: DC | PRN

## 2022-03-28 NOTE — Discharge Instructions (Addendum)
Take medications as directed.  Don't double up on any medications. Please follow-up with your doctor.  Return for new or worsening symptoms.

## 2022-04-03 ENCOUNTER — Encounter (HOSPITAL_BASED_OUTPATIENT_CLINIC_OR_DEPARTMENT_OTHER): Payer: Self-pay

## 2022-04-03 ENCOUNTER — Other Ambulatory Visit: Payer: Self-pay

## 2022-04-03 ENCOUNTER — Emergency Department (HOSPITAL_BASED_OUTPATIENT_CLINIC_OR_DEPARTMENT_OTHER): Payer: Commercial Managed Care - HMO

## 2022-04-03 ENCOUNTER — Emergency Department (HOSPITAL_BASED_OUTPATIENT_CLINIC_OR_DEPARTMENT_OTHER)
Admission: EM | Admit: 2022-04-03 | Discharge: 2022-04-04 | Disposition: A | Discharge status: Home / Self Care | Payer: Commercial Managed Care - HMO | Attending: Emergency Medicine | Admitting: Emergency Medicine

## 2022-04-03 DIAGNOSIS — N9489 Other specified conditions associated with female genital organs and menstrual cycle: Secondary | ICD-10-CM | POA: Insufficient documentation

## 2022-04-03 DIAGNOSIS — S0990XA Unspecified injury of head, initial encounter: Secondary | ICD-10-CM | POA: Diagnosis present

## 2022-04-03 DIAGNOSIS — R051 Acute cough: Secondary | ICD-10-CM | POA: Insufficient documentation

## 2022-04-03 DIAGNOSIS — W19XXXA Unspecified fall, initial encounter: Secondary | ICD-10-CM | POA: Diagnosis not present

## 2022-04-03 DIAGNOSIS — Z794 Long term (current) use of insulin: Secondary | ICD-10-CM | POA: Diagnosis not present

## 2022-04-03 DIAGNOSIS — R739 Hyperglycemia, unspecified: Secondary | ICD-10-CM

## 2022-04-03 DIAGNOSIS — E1165 Type 2 diabetes mellitus with hyperglycemia: Secondary | ICD-10-CM | POA: Diagnosis not present

## 2022-04-03 DIAGNOSIS — R052 Subacute cough: Secondary | ICD-10-CM

## 2022-04-03 LAB — URINALYSIS, ROUTINE W REFLEX MICROSCOPIC
Bilirubin Urine: NEGATIVE
Glucose, UA: >1000 mg/dL — AB
Ketones, ur: NEGATIVE mg/dL
Leukocytes,Ua: NEGATIVE
Nitrite: NEGATIVE
Specific Gravity, Urine: 1.019 (ref 1.005–1.030)
pH: 5.5 (ref 5.0–8.0)

## 2022-04-03 LAB — CBC
HCT: 39.7 % (ref 36.0–46.0)
Hemoglobin: 13.3 g/dL (ref 12.0–15.0)
MCH: 28.4 pg (ref 26.0–34.0)
MCHC: 33.5 g/dL (ref 30.0–36.0)
MCV: 84.6 fL (ref 80.0–100.0)
Platelets: 312 10*3/uL (ref 150–400)
RBC: 4.69 MIL/uL (ref 3.87–5.11)
RDW: 12.6 % (ref 11.5–15.5)
WBC: 10.9 10*3/uL — ABNORMAL HIGH (ref 4.0–10.5)
nRBC: 0 % (ref 0.0–0.2)

## 2022-04-03 LAB — HCG, SERUM, QUALITATIVE: Preg, Serum: NEGATIVE

## 2022-04-03 LAB — BASIC METABOLIC PANEL
Anion gap: 10 (ref 5–15)
BUN: 7 mg/dL (ref 6–20)
CO2: 27 mmol/L (ref 22–32)
Calcium: 8.1 mg/dL — ABNORMAL LOW (ref 8.9–10.3)
Chloride: 97 mmol/L — ABNORMAL LOW (ref 98–111)
Creatinine, Ser: 0.82 mg/dL (ref 0.44–1.00)
GFR, Estimated: >60 mL/min (ref >60)
Glucose, Bld: 369 mg/dL — ABNORMAL HIGH (ref 70–99)
Potassium: 3.5 mmol/L (ref 3.5–5.1)
Sodium: 134 mmol/L — ABNORMAL LOW (ref 135–145)

## 2022-04-03 LAB — CBG MONITORING, ED: Glucose-Capillary: 289 mg/dL — ABNORMAL HIGH (ref 70–99)

## 2022-04-03 MED ORDER — METOCLOPRAMIDE HCL 10 MG PO TABS
10.0000 mg | ORAL_TABLET | Freq: Once | ORAL | Status: AC
  Administered 2022-04-04: 10 mg via ORAL
  Filled 2022-04-03: qty 1

## 2022-04-03 MED ORDER — METOCLOPRAMIDE HCL 10 MG PO TABS: 10.0000 mg | ORAL_TABLET | Freq: Three times a day (TID) | ORAL | 0 refills | Status: DC | PRN

## 2022-04-03 MED ORDER — PROMETHAZINE-DM 6.25-15 MG/5ML PO SYRP: 5.0000 mL | ORAL_SOLUTION | Freq: Three times a day (TID) | ORAL | 0 refills | Status: DC | PRN

## 2022-04-03 MED ORDER — BENZONATATE 100 MG PO CAPS: 100.0000 mg | ORAL_CAPSULE | Freq: Three times a day (TID) | ORAL | 0 refills | Status: DC | PRN

## 2022-04-03 NOTE — ED Notes (Signed)
Provider at bedside

## 2022-04-03 NOTE — ED Provider Notes (Signed)
[facility] EMERGENCY DEPT  Provider Note  CSN: [id number] Arrival date & time: 04/03/22 2005  History Chief Complaint  Patient presents with   Emesis    Karen Stein is a 51 y.o. female with history of poorly controlled DM, gastroparesis was seen at [facility] on 11/26 and found to have RSV, seen in the ED 11/27 for hyperglycemia, nausea and vomiting. She was not in DKA and symptoms had improved prior to discharge. She reports persistent nausea and vomiting over the last week. She also reports she passed out a fell onto a door jam sometime in the last 48 hours although she isn't sure exactly when that happened. She mostly came today to be assessed for her head injury and 'to make sure her labs look OK'. She reports she is not currently having any nausea and is able to keep down liquids while waiting for ED room.    Home Medications Prior to Admission medications   Medication Sig Start Date End Date Taking? Authorizing Provider  albuterol (VENTOLIN HFA) 108 (90 Base) MCG/ACT inhaler Inhale 1-2 puffs into the lungs every 6 (six) hours as needed for wheezing or shortness of breath. 03/26/22   Jaynee Eagles, PA-C  atorvastatin (LIPITOR) 20 MG tablet Take 1 tablet (20 mg total) by mouth daily at 6 PM. 08/01/18   Black, Lezlie Octave, NP  benzonatate (TESSALON) 100 MG capsule Take 1 capsule (100 mg total) by mouth 3 (three) times daily as needed for cough. 04/03/22   Truddie Hidden, MD  ciclopirox (PENLAC) 8 % solution Apply topically. 01/13/21   [provider]  diclofenac Sodium (VOLTAREN) 1 % GEL Apply 4 g topically 4 (four) times daily. 02/26/22   Deno Etienne, DO  Insulin Aspart, w/Niacinamide, (FIASP) 100 UNIT/ML SOLN Inject 5-16 Units into the skin with breakfast, with lunch, and with evening meal. Per sliding scale 09/11/19   [provider]  lidocaine (LIDODERM) 5 % Place 1 patch onto the skin daily. Remove & Discard patch within 12 hours or as directed by MD 08/23/19    Randal Buba, April, MD  methocarbamol (ROBAXIN) 500 MG tablet Take 1 tablet (500 mg total) by mouth every 8 (eight) hours as needed for muscle spasms. 11/10/20   Maudie Flakes, MD  metoCLOPramide (REGLAN) 10 MG tablet Take 1 tablet (10 mg total) by mouth every 8 (eight) hours as needed for nausea. 04/03/22   Truddie Hidden, MD  ondansetron (ZOFRAN-ODT) 4 MG disintegrating tablet Take 1 tablet (4 mg total) by mouth every 8 (eight) hours as needed for nausea or vomiting. 03/28/22   Montine Circle, PA-C  Oxycodone HCl 10 MG TABS Take 10 mg by mouth every 6 (six) hours. 11/07/19   [provider]  pregabalin (LYRICA) 200 MG capsule Take 200 mg by mouth 3 (three) times daily. 01/27/21   [provider]  promethazine-dextromethorphan (PROMETHAZINE-DM) 6.25-15 MG/5ML syrup Take 5 mLs by mouth 3 (three) times daily as needed for cough. 04/03/22   Truddie Hidden, MD  Semaglutide,0.25 or 0.'5MG'$ /DOS, (OZEMPIC, 0.25 OR 0.5 MG/DOSE,) 2 MG/1.5ML SOPN Inject 0.4 mLs into the skin once a week. 09/11/19   [provider]     Allergies    Amoxicillin, Amoxicillin-pot clavulanate, Macrobid [nitrofurantoin macrocrystal], Rosuvastatin, Shellfish allergy, Sulfa antibiotics, and Nitrofurantoin   Review of Systems   Review of Systems Please see HPI for pertinent positives and negatives  Physical Exam BP (!) 153/86 (BP Location: Right Arm)   Pulse 98   Temp  98.6 F (37 C)   Resp 19   Ht '5\' 8"'$  (1.727 m)   Wt 108.9 kg   LMP 11/13/2015 (Exact Date)   SpO2 97%   BMI 36.50 kg/m   Physical Exam Vitals and nursing note reviewed.  Constitutional:      Appearance: Normal appearance.  HENT:     Head: Normocephalic and atraumatic.     Nose: Nose normal.     Mouth/Throat:     Mouth: Mucous membranes are dry.  Eyes:     Extraocular Movements: Extraocular movements intact.     Conjunctiva/sclera: Conjunctivae normal.  Cardiovascular:     Rate and Rhythm: Normal rate.  Pulmonary:      Effort: Pulmonary effort is normal.     Breath sounds: Normal breath sounds.  Abdominal:     General: Abdomen is flat.     Palpations: Abdomen is soft.     Tenderness: There is no abdominal tenderness.  Musculoskeletal:        General: No swelling. Normal range of motion.     Cervical back: Neck supple.  Skin:    General: Skin is warm and dry.  Neurological:     General: No focal deficit present.     Mental Status: She is alert.  Psychiatric:        Mood and Affect: Mood normal.     ED Results / Procedures / Treatments   EKG None  Procedures Procedures  Medications Ordered in the ED Medications  metoCLOPramide (REGLAN) tablet 10 mg (has no administration in time range)    Initial Impression and Plan  Patient here with hyperglycemia and reported syncope. She reports nausea is improved now. She had labs done in triage showing CBC with mild leukocytosis, BMP with hyperglycemia but normal anion gap. UA with glucose but no infection. I personally viewed the images from radiology studies and agree with radiologist interpretation: CT head is neg for acute injury. I offered the patient IVF and antiemetics but she declines. States she would like to try oral Reglan, zofran she has at home does not always work well. She is also requesting a refill of tessalon and cough syrup she had for her RSV symptoms last week.   ED Course       MDM Rules/Calculators/A&P Medical Decision Making Problems Addressed: Fall, initial encounter: acute illness or injury Hyperglycemia: chronic illness or injury with exacerbation, progression, or side effects of treatment Injury of head, initial encounter: acute illness or injury Subacute cough: self-limited or minor problem  Amount and/or Complexity of Data Reviewed Labs: ordered. Decision-making details documented in ED Course. Radiology: ordered and independent interpretation performed. Decision-making details documented in ED Course.    Final  Clinical Impression(s) / ED Diagnoses Final diagnoses:  Hyperglycemia  Fall, initial encounter  Injury of head, initial encounter  Subacute cough    Rx / DC Orders ED Discharge Orders          Ordered    benzonatate (TESSALON) 100 MG capsule  3 times daily PRN        04/03/22 2352    metoCLOPramide (REGLAN) 10 MG tablet  Every 8 hours PRN        04/03/22 2352    promethazine-dextromethorphan (PROMETHAZINE-DM) 6.25-15 MG/5ML syrup  3 times daily PRN        04/03/22 2352             Truddie Hidden, MD 04/03/22 2353

## 2022-04-03 NOTE — ED Triage Notes (Signed)
Patient here POV from Home.  Endorses N/V for approximately 8 Days. Had a Syncopal Episode at 0100 today. Head Injury to Forehead against Door Hinge.   Was seen for Hyperglycemia 1 week ago.   NAD Noted during Triage. A&Ox4. GCS 15. BIB Wheelchair.

## 2022-04-04 DIAGNOSIS — S0990XA Unspecified injury of head, initial encounter: Secondary | ICD-10-CM | POA: Diagnosis not present

## 2022-04-23 ENCOUNTER — Ambulatory Visit
Admission: EM | Admit: 2022-04-23 | Discharge: 2022-04-23 | Discharge status: Against Medical Advice | Payer: BC Managed Care – PPO

## 2022-04-29 ENCOUNTER — Ambulatory Visit: Admit: 2022-04-29 | Payer: BC Managed Care – PPO

## 2022-04-29 ENCOUNTER — Telehealth: Payer: Self-pay | Admitting: Internal Medicine

## 2022-04-29 ENCOUNTER — Ambulatory Visit
Admission: EM | Admit: 2022-04-29 | Discharge: 2022-04-29 | Disposition: A | Discharge status: Home / Self Care | Payer: BC Managed Care – PPO | Attending: Internal Medicine | Admitting: Internal Medicine

## 2022-04-29 ENCOUNTER — Other Ambulatory Visit: Payer: Self-pay

## 2022-04-29 ENCOUNTER — Ambulatory Visit (INDEPENDENT_AMBULATORY_CARE_PROVIDER_SITE_OTHER): Payer: BC Managed Care – PPO

## 2022-04-29 ENCOUNTER — Encounter: Payer: Self-pay | Admitting: Emergency Medicine

## 2022-04-29 DIAGNOSIS — R053 Chronic cough: Secondary | ICD-10-CM

## 2022-04-29 DIAGNOSIS — R0989 Other specified symptoms and signs involving the circulatory and respiratory systems: Secondary | ICD-10-CM | POA: Diagnosis not present

## 2022-04-29 DIAGNOSIS — R059 Cough, unspecified: Secondary | ICD-10-CM | POA: Diagnosis not present

## 2022-04-29 DIAGNOSIS — J069 Acute upper respiratory infection, unspecified: Secondary | ICD-10-CM

## 2022-04-29 MED ORDER — ALBUTEROL SULFATE HFA 108 (90 BASE) MCG/ACT IN AERS: 1.0000 | INHALATION_SPRAY | Freq: Four times a day (QID) | RESPIRATORY_TRACT | 0 refills | Status: AC | PRN

## 2022-04-29 MED ORDER — DOXYCYCLINE HYCLATE 100 MG PO CAPS: 100.0000 mg | ORAL_CAPSULE | Freq: Two times a day (BID) | ORAL | 0 refills | Status: DC

## 2022-04-29 MED ORDER — FLUCONAZOLE 150 MG PO TABS: 150.0000 mg | ORAL_TABLET | Freq: Every day | ORAL | 0 refills | Status: DC

## 2022-04-29 NOTE — ED Triage Notes (Signed)
Pt here for cough and congestion x 1 month; pt sts diagnosed with RSV the day before Thanksgiving; pt sts feels like she may have pneumonia

## 2022-04-29 NOTE — Telephone Encounter (Signed)
Entered by mistake

## 2022-04-29 NOTE — ED Provider Notes (Signed)
[facility]    CSN: [id number] Arrival date & time: 04/29/22  1119      History   Chief Complaint Chief Complaint  Patient presents with   Cough    HPI Karen Stein is a 51 y.o. female.   Patient presents with cough and nasal congestion that have been present for 1 month.  Patient reports that she was diagnosed with RSV when symptoms first started.  She has been having some new onset shortness of breath and chest discomfort so she is concerned for pneumonia.  She denies any known fevers at home.  Denies history of asthma or COPD but patient has used albuterol inhaler before.  She reports that she does not currently have one at home to use.  Denies sore throat, ear pain, nausea, vomiting, diarrhea, abdominal pain.   Cough   Past Medical History:  Diagnosis Date   Anemia    Anxiety    Arthritis    Chronic pain    Complication of anesthesia    anesthesia awareness,   DDD (degenerative disc disease)    DDD (degenerative disc disease)    Degenerative disc disease    Degenerative disc disease    Diabetes mellitus    Type 2 insulin resistant   Dysrhythmia    stress and caffeine related   Endometriosis    Fibroids    Fibromyalgia    GERD (gastroesophageal reflux disease)    High cholesterol    Insomnia    Leg cramps    PCOS (polycystic ovarian syndrome)    Scoliosis    Sickle cell trait ([place])     Patient Active Problem List   Diagnosis Date Noted   Palpitations 05/25/2021   DKA (diabetic ketoacidoses) 01/26/2019   Esophagitis    Hyperbilirubinemia    AKI (acute kidney injury) ([place])    OSA on CPAP    Lacunar infarction ([place])    Mixed hyperlipidemia 07/31/2018   Abnormal EKG 07/31/2018   Chest pain 07/31/2018   Nausea vomiting and diarrhea 07/31/2018   Abdominal pain 07/31/2018   Elevated blood protein 07/31/2018   Type 2 diabetes mellitus without complication, with long-term current use of insulin ([place]) 07/31/2018   Anxiety    Depression  08/25/2017   Major depressive disorder, 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   Degenerative disc disease    Sickle cell trait ([place])    DDD (degenerative disc disease), lumbar     Past Surgical History:  Procedure Laterality Date   BACK SURGERY  2010   BIOPSY BREAST     CESAREAN SECTION     DILATATION & CURETTAGE/HYSTEROSCOPY WITH MYOSURE N/A 01/01/2015   Procedure: DILATATION & CURETTAGE/HYSTEROSCOPY WITH MYOSURE;  Surgeon: Crawford Givens, MD;  Location: [facility];  Service: Gynecology;  Laterality: N/A;   LAPAROSCOPY  2002 or 2003   TOE SURGERY  2008   WISDOM TOOTH EXTRACTION      OB History     Gravida  4   Para  2   Term  1   Preterm  1   AB  2   Living  1      SAB  2   IAB      Ectopic      Multiple      Live Births               Home Medications    Prior to Admission medications   Medication Sig Start Date End Date Taking? Authorizing Provider  doxycycline (VIBRAMYCIN) 100 MG capsule Take 1 capsule (100 mg total) by mouth 2 (two) times daily. 04/29/22  Yes , Hildred Alamin E, FNP  fluconazole (DIFLUCAN) 150 MG tablet Take 1 tablet (150 mg total) by mouth daily. Take at first sign of vaginal yeast. 04/29/22  Yes , Michele Rockers, FNP  albuterol (VENTOLIN HFA) 108 (90 Base) MCG/ACT inhaler Inhale 1-2 puffs into the lungs every 6 (six) hours as needed for wheezing or shortness of breath. 04/29/22   Teodora Medici, FNP  atorvastatin (LIPITOR) 20 MG tablet Take 1 tablet (20 mg total) by mouth daily at 6 PM. 08/01/18   Black, Lezlie Octave, NP  benzonatate (TESSALON) 100 MG capsule Take 1 capsule (100 mg total) by mouth 3 (three) times daily as needed for cough. 04/03/22   Truddie Hidden, MD  ciclopirox (PENLAC) 8 % solution Apply topically. 01/13/21   [provider]  diclofenac Sodium (VOLTAREN) 1 % GEL Apply 4 g topically 4 (four) times daily. 02/26/22   Deno Etienne, DO  Insulin Aspart, w/Niacinamide, (FIASP) 100 UNIT/ML SOLN Inject 5-16  Units into the skin with breakfast, with lunch, and with evening meal. Per sliding scale 09/11/19   [provider]  lidocaine (LIDODERM) 5 % Place 1 patch onto the skin daily. Remove & Discard patch within 12 hours or as directed by MD 08/23/19   Randal Buba, April, MD  methocarbamol (ROBAXIN) 500 MG tablet Take 1 tablet (500 mg total) by mouth every 8 (eight) hours as needed for muscle spasms. 11/10/20   Maudie Flakes, MD  metoCLOPramide (REGLAN) 10 MG tablet Take 1 tablet (10 mg total) by mouth every 8 (eight) hours as needed for nausea. 04/03/22   Truddie Hidden, MD  ondansetron (ZOFRAN-ODT) 4 MG disintegrating tablet Take 1 tablet (4 mg total) by mouth every 8 (eight) hours as needed for nausea or vomiting. 03/28/22   Montine Circle, PA-C  Oxycodone HCl 10 MG TABS Take 10 mg by mouth every 6 (six) hours. 11/07/19   [provider]  pregabalin (LYRICA) 200 MG capsule Take 200 mg by mouth 3 (three) times daily. 01/27/21   [provider]  promethazine-dextromethorphan (PROMETHAZINE-DM) 6.25-15 MG/5ML syrup Take 5 mLs by mouth 3 (three) times daily as needed for cough. 04/03/22   Truddie Hidden, MD  Semaglutide,0.25 or 0.'5MG'$ /DOS, (OZEMPIC, 0.25 OR 0.5 MG/DOSE,) 2 MG/1.5ML SOPN Inject 0.4 mLs into the skin once a week. 09/11/19   [provider]    Family History Family History  Problem Relation Age of Onset   Hyperlipidemia Mother    Heart attack Father 66   Diabetes Father    Hypertension Father    Hyperlipidemia Father    Kidney failure Father    Heart murmur Brother    Alzheimer's disease Maternal Grandmother    Brain cancer Maternal Grandfather    Diabetes Paternal Grandmother    Hypertension Paternal Grandmother    Hyperlipidemia Paternal Grandmother    Diabetes Paternal Grandfather    Hypertension Paternal Grandfather    Hyperlipidemia Paternal Grandfather     Social History Social History   Tobacco Use   Smoking status: Never   Smokeless  tobacco: Never  Vaping Use   Vaping Use: Never used  Substance Use Topics   Alcohol use: Yes    Comment: occ   Drug use: Yes    Types: Marijuana     Allergies   Amoxicillin, Amoxicillin-pot clavulanate, Macrobid [nitrofurantoin macrocrystal], Rosuvastatin, Shellfish allergy, Sulfa antibiotics, and Nitrofurantoin  Review of Systems Review of Systems Per HPI  Physical Exam Triage Vital Signs ED Triage Vitals  Enc Vitals Group     BP 04/29/22 1438 133/72     Pulse Rate 04/29/22 1438 (!) 103     Resp 04/29/22 1438 20     Temp 04/29/22 1438 98.5 F (36.9 C)     Temp Source 04/29/22 1438 Oral     SpO2 04/29/22 1438 95 %     Weight --      Height --      Head Circumference --      Peak Flow --      Pain Score 04/29/22 1439 6     Pain Loc --      Pain Edu? --      Excl. in [place]? --    No data found.  Updated Vital Signs BP 133/72 (BP Location: Left Arm)   Pulse (!) 103   Temp 98.5 F (36.9 C) (Oral)   Resp 20   LMP 11/13/2015 (Exact Date)   SpO2 95%   Visual Acuity Right Eye Distance:   Left Eye Distance:   Bilateral Distance:    Right Eye Near:   Left Eye Near:    Bilateral Near:     Physical Exam Constitutional:      General: She is not in acute distress.    Appearance: Normal appearance. She is not toxic-appearing or diaphoretic.  HENT:     Head: Normocephalic and atraumatic.     Right Ear: Tympanic membrane and ear canal normal.     Left Ear: Tympanic membrane and ear canal normal.     Nose: Congestion present.     Mouth/Throat:     Mouth: Mucous membranes are moist.     Pharynx: No posterior oropharyngeal erythema.  Eyes:     Extraocular Movements: Extraocular movements intact.     Conjunctiva/sclera: Conjunctivae normal.     Pupils: Pupils are equal, round, and reactive to light.  Cardiovascular:     Rate and Rhythm: Normal rate and regular rhythm.     Pulses: Normal pulses.     Heart sounds: Normal heart sounds.  Pulmonary:     Effort:  Pulmonary effort is normal. No respiratory distress.     Breath sounds: Normal breath sounds. No stridor. No wheezing, rhonchi or rales.  Abdominal:     General: Abdomen is flat. Bowel sounds are normal.     Palpations: Abdomen is soft.  Musculoskeletal:        General: Normal range of motion.     Cervical back: Normal range of motion.  Skin:    General: Skin is warm and dry.  Neurological:     General: No focal deficit present.     Mental Status: She is alert and oriented to person, place, and time. Mental status is at baseline.  Psychiatric:        Mood and Affect: Mood normal.        Behavior: Behavior normal.      UC Treatments / Results  Labs (all labs ordered are listed, but only abnormal results are displayed) Labs Reviewed - No data to display  EKG   Radiology DG Chest 2 View  Result Date: 04/29/2022 CLINICAL DATA:  Cough and congestion for 1 month. EXAM: CHEST - 2 VIEW COMPARISON:  Chest two views 03/26/2022 FINDINGS: Cardiac silhouette and mediastinal contours are within normal limits. The lungs are clear. No pleural effusion or pneumothorax. Mild dextrocurvature of the lower  thoracic spine. Two spinal cord stimulator electrodes overlie the posterior midline aspect of the central canal of the lower thoracic spine. IMPRESSION: No active cardiopulmonary disease. Electronically Signed   By: Yvonne Kendall M.D.   On: 04/29/2022 15:13    Procedures Procedures (including critical care time)  Medications Ordered in UC Medications - No data to display  Initial Impression / Assessment and Plan / UC Course  I have reviewed the triage vital signs and the nursing notes.  Pertinent labs & imaging results that were available during my care of the patient were reviewed by me and considered in my medical decision making (see chart for details).     Patient tested positive for RSV at start of symptoms.  I am concerned for secondary bacterial sinus infection given persistent  nasal congestion due to viral illness.  Will treat with doxycycline due to this.  Chest x-ray completed that was negative for any acute cardiopulmonary process.  Will treat cough and shortness of breath with albuterol inhaler as she has taken this before and tolerated well.  Advised supportive care such as cough medication as well.  Patient was advised to follow-up if symptoms persist or worsen and was given strict ER precautions.  Patient verbalized understanding and was agreeable with plan. Final Clinical Impressions(s) / UC Diagnoses   Final diagnoses:  Acute upper respiratory infection  Persistent cough for 3 weeks or longer     Discharge Instructions      Your chest x-ray was normal.  I have prescribed you an antibiotic and inhaler to help alleviate symptoms.  Please follow-up if symptoms persist or worsen.     ED Prescriptions     Medication Sig Dispense Auth. Provider   albuterol (VENTOLIN HFA) 108 (90 Base) MCG/ACT inhaler Inhale 1-2 puffs into the lungs every 6 (six) hours as needed for wheezing or shortness of breath. 18 g Oswaldo Conroy E, [place]   doxycycline (VIBRAMYCIN) 100 MG capsule Take 1 capsule (100 mg total) by mouth 2 (two) times daily. 20 capsule [place], [place] E, [place]   fluconazole (DIFLUCAN) 150 MG tablet Take 1 tablet (150 mg total) by mouth daily. Take at first sign of vaginal yeast. 1 tablet [place], Michele Rockers, [place]      PDMP not reviewed this encounter.   Teodora Medici, [place] 04/29/22 [phone]

## 2022-04-29 NOTE — Discharge Instructions (Signed)
Your chest x-ray was normal.  I have prescribed you an antibiotic and inhaler to help alleviate symptoms.  Please follow-up if symptoms persist or worsen.

## 2022-05-01 ENCOUNTER — Other Ambulatory Visit: Payer: Self-pay | Admitting: Vascular Surgery

## 2022-05-02 NOTE — Telephone Encounter (Signed)
Medication has been discontinued since 05/2021

## 2022-05-29 ENCOUNTER — Other Ambulatory Visit: Payer: Self-pay

## 2022-05-29 ENCOUNTER — Emergency Department (HOSPITAL_COMMUNITY): Payer: No Typology Code available for payment source

## 2022-05-29 ENCOUNTER — Encounter (HOSPITAL_COMMUNITY): Payer: Self-pay

## 2022-05-29 ENCOUNTER — Inpatient Hospital Stay (HOSPITAL_COMMUNITY): Payer: No Typology Code available for payment source

## 2022-05-29 ENCOUNTER — Inpatient Hospital Stay (HOSPITAL_COMMUNITY)
Admission: EM | Admit: 2022-05-29 | Discharge: 2022-06-01 | DRG: 552 | Disposition: A | Discharge status: Home Health | Payer: No Typology Code available for payment source | Attending: Internal Medicine | Admitting: Internal Medicine

## 2022-05-29 DIAGNOSIS — Z9641 Presence of insulin pump (external) (internal): Secondary | ICD-10-CM | POA: Diagnosis present

## 2022-05-29 DIAGNOSIS — Z79899 Other long term (current) drug therapy: Secondary | ICD-10-CM

## 2022-05-29 DIAGNOSIS — F419 Anxiety disorder, unspecified: Secondary | ICD-10-CM | POA: Diagnosis present

## 2022-05-29 DIAGNOSIS — Z880 Allergy status to penicillin: Secondary | ICD-10-CM

## 2022-05-29 DIAGNOSIS — Z79891 Long term (current) use of opiate analgesic: Secondary | ICD-10-CM | POA: Diagnosis not present

## 2022-05-29 DIAGNOSIS — I6523 Occlusion and stenosis of bilateral carotid arteries: Secondary | ICD-10-CM

## 2022-05-29 DIAGNOSIS — K5903 Drug induced constipation: Secondary | ICD-10-CM | POA: Diagnosis present

## 2022-05-29 DIAGNOSIS — Z83438 Family history of other disorder of lipoprotein metabolism and other lipidemia: Secondary | ICD-10-CM

## 2022-05-29 DIAGNOSIS — Z841 Family history of disorders of kidney and ureter: Secondary | ICD-10-CM

## 2022-05-29 DIAGNOSIS — G459 Transient cerebral ischemic attack, unspecified: Secondary | ICD-10-CM | POA: Diagnosis present

## 2022-05-29 DIAGNOSIS — Z91013 Allergy to seafood: Secondary | ICD-10-CM | POA: Diagnosis not present

## 2022-05-29 DIAGNOSIS — Z794 Long term (current) use of insulin: Secondary | ICD-10-CM

## 2022-05-29 DIAGNOSIS — D573 Sickle-cell trait: Secondary | ICD-10-CM | POA: Diagnosis present

## 2022-05-29 DIAGNOSIS — E669 Obesity, unspecified: Secondary | ICD-10-CM | POA: Diagnosis present

## 2022-05-29 DIAGNOSIS — G8929 Other chronic pain: Secondary | ICD-10-CM | POA: Diagnosis present

## 2022-05-29 DIAGNOSIS — G4733 Obstructive sleep apnea (adult) (pediatric): Secondary | ICD-10-CM

## 2022-05-29 DIAGNOSIS — T40605A Adverse effect of unspecified narcotics, initial encounter: Secondary | ICD-10-CM | POA: Diagnosis present

## 2022-05-29 DIAGNOSIS — Z888 Allergy status to other drugs, medicaments and biological substances status: Secondary | ICD-10-CM | POA: Diagnosis not present

## 2022-05-29 DIAGNOSIS — E1143 Type 2 diabetes mellitus with diabetic autonomic (poly)neuropathy: Secondary | ICD-10-CM | POA: Diagnosis present

## 2022-05-29 DIAGNOSIS — K219 Gastro-esophageal reflux disease without esophagitis: Secondary | ICD-10-CM | POA: Diagnosis present

## 2022-05-29 DIAGNOSIS — E119 Type 2 diabetes mellitus without complications: Secondary | ICD-10-CM

## 2022-05-29 DIAGNOSIS — R200 Anesthesia of skin: Secondary | ICD-10-CM | POA: Diagnosis not present

## 2022-05-29 DIAGNOSIS — Z8249 Family history of ischemic heart disease and other diseases of the circulatory system: Secondary | ICD-10-CM

## 2022-05-29 DIAGNOSIS — M5416 Radiculopathy, lumbar region: Secondary | ICD-10-CM | POA: Diagnosis present

## 2022-05-29 DIAGNOSIS — Z881 Allergy status to other antibiotic agents status: Secondary | ICD-10-CM | POA: Diagnosis not present

## 2022-05-29 DIAGNOSIS — Z6836 Body mass index (BMI) 36.0-36.9, adult: Secondary | ICD-10-CM | POA: Diagnosis not present

## 2022-05-29 DIAGNOSIS — F32A Depression, unspecified: Secondary | ICD-10-CM | POA: Diagnosis present

## 2022-05-29 DIAGNOSIS — E1142 Type 2 diabetes mellitus with diabetic polyneuropathy: Secondary | ICD-10-CM | POA: Diagnosis present

## 2022-05-29 DIAGNOSIS — Z820 Family history of epilepsy and other diseases of the nervous system: Secondary | ICD-10-CM

## 2022-05-29 DIAGNOSIS — Z7985 Long-term (current) use of injectable non-insulin antidiabetic drugs: Secondary | ICD-10-CM

## 2022-05-29 DIAGNOSIS — F332 Major depressive disorder, recurrent severe without psychotic features: Secondary | ICD-10-CM | POA: Diagnosis present

## 2022-05-29 DIAGNOSIS — Z882 Allergy status to sulfonamides status: Secondary | ICD-10-CM

## 2022-05-29 DIAGNOSIS — K3184 Gastroparesis: Secondary | ICD-10-CM | POA: Diagnosis present

## 2022-05-29 DIAGNOSIS — E782 Mixed hyperlipidemia: Secondary | ICD-10-CM | POA: Diagnosis not present

## 2022-05-29 DIAGNOSIS — I10 Essential (primary) hypertension: Secondary | ICD-10-CM | POA: Diagnosis present

## 2022-05-29 DIAGNOSIS — Z833 Family history of diabetes mellitus: Secondary | ICD-10-CM

## 2022-05-29 DIAGNOSIS — I6529 Occlusion and stenosis of unspecified carotid artery: Secondary | ICD-10-CM | POA: Diagnosis present

## 2022-05-29 DIAGNOSIS — Z808 Family history of malignant neoplasm of other organs or systems: Secondary | ICD-10-CM

## 2022-05-29 DIAGNOSIS — I6521 Occlusion and stenosis of right carotid artery: Secondary | ICD-10-CM | POA: Diagnosis not present

## 2022-05-29 DIAGNOSIS — M797 Fibromyalgia: Secondary | ICD-10-CM | POA: Diagnosis present

## 2022-05-29 LAB — URINALYSIS, ROUTINE W REFLEX MICROSCOPIC
Bilirubin Urine: NEGATIVE
Glucose, UA: NEGATIVE mg/dL
Hgb urine dipstick: NEGATIVE
Ketones, ur: NEGATIVE mg/dL
Leukocytes,Ua: NEGATIVE
Nitrite: NEGATIVE
Protein, ur: NEGATIVE mg/dL
Specific Gravity, Urine: 1.016 (ref 1.005–1.030)
pH: 5 (ref 5.0–8.0)

## 2022-05-29 LAB — CBG MONITORING, ED
Glucose-Capillary: 190 mg/dL — ABNORMAL HIGH (ref 70–99)
Glucose-Capillary: 241 mg/dL — ABNORMAL HIGH (ref 70–99)

## 2022-05-29 LAB — I-STAT CHEM 8, ED
BUN: 7 mg/dL (ref 6–20)
Calcium, Ion: 1.05 mmol/L — ABNORMAL LOW (ref 1.15–1.40)
Chloride: 100 mmol/L (ref 98–111)
Creatinine, Ser: 0.9 mg/dL (ref 0.44–1.00)
Glucose, Bld: 166 mg/dL — ABNORMAL HIGH (ref 70–99)
HCT: 40 % (ref 36.0–46.0)
Hemoglobin: 13.6 g/dL (ref 12.0–15.0)
Potassium: 3.8 mmol/L (ref 3.5–5.1)
Sodium: 138 mmol/L (ref 135–145)
TCO2: 26 mmol/L (ref 22–32)

## 2022-05-29 LAB — CBC
HCT: 40 % (ref 36.0–46.0)
HCT: 38.3 % (ref 36.0–46.0)
Hemoglobin: 13.2 g/dL (ref 12.0–15.0)
Hemoglobin: 12.7 g/dL (ref 12.0–15.0)
MCH: 29.3 pg (ref 26.0–34.0)
MCH: 28.9 pg (ref 26.0–34.0)
MCHC: 33 g/dL (ref 30.0–36.0)
MCHC: 33.2 g/dL (ref 30.0–36.0)
MCV: 88.9 fL (ref 80.0–100.0)
MCV: 87.2 fL (ref 80.0–100.0)
Platelets: 359 10*3/uL (ref 150–400)
Platelets: 324 10*3/uL (ref 150–400)
RBC: 4.5 MIL/uL (ref 3.87–5.11)
RBC: 4.39 MIL/uL (ref 3.87–5.11)
RDW: 13.4 % (ref 11.5–15.5)
RDW: 13.3 % (ref 11.5–15.5)
WBC: 9.8 10*3/uL (ref 4.0–10.5)
WBC: 9.2 10*3/uL (ref 4.0–10.5)
nRBC: 0 % (ref 0.0–0.2)
nRBC: 0 % (ref 0.0–0.2)

## 2022-05-29 LAB — DIFFERENTIAL
Abs Immature Granulocytes: 0.02 10*3/uL (ref 0.00–0.07)
Basophils Absolute: 0.1 10*3/uL (ref 0.0–0.1)
Basophils Relative: 1 %
Eosinophils Absolute: 0.2 10*3/uL (ref 0.0–0.5)
Eosinophils Relative: 2 %
Immature Granulocytes: 0 %
Lymphocytes Relative: 31 %
Lymphs Abs: 3 10*3/uL (ref 0.7–4.0)
Monocytes Absolute: 0.6 10*3/uL (ref 0.1–1.0)
Monocytes Relative: 6 %
Neutro Abs: 5.9 10*3/uL (ref 1.7–7.7)
Neutrophils Relative %: 60 %

## 2022-05-29 LAB — RAPID URINE DRUG SCREEN, HOSP PERFORMED
Amphetamines: NOT DETECTED
Barbiturates: NOT DETECTED
Benzodiazepines: NOT DETECTED
Cocaine: NOT DETECTED
Opiates: NOT DETECTED
Tetrahydrocannabinol: POSITIVE — AB

## 2022-05-29 LAB — CREATININE, SERUM
Creatinine, Ser: 0.91 mg/dL (ref 0.44–1.00)
GFR, Estimated: >60 mL/min (ref >60)

## 2022-05-29 LAB — COMPREHENSIVE METABOLIC PANEL
ALT: 11 U/L (ref 0–44)
AST: 22 U/L (ref 15–41)
Albumin: 3.6 g/dL (ref 3.5–5.0)
Alkaline Phosphatase: 96 U/L (ref 38–126)
Anion gap: 9 (ref 5–15)
BUN: 10 mg/dL (ref 6–20)
CO2: 24 mmol/L (ref 22–32)
Calcium: 8.2 mg/dL — ABNORMAL LOW (ref 8.9–10.3)
Chloride: 102 mmol/L (ref 98–111)
Creatinine, Ser: 0.89 mg/dL (ref 0.44–1.00)
GFR, Estimated: >60 mL/min (ref >60)
Glucose, Bld: 170 mg/dL — ABNORMAL HIGH (ref 70–99)
Potassium: 3.9 mmol/L (ref 3.5–5.1)
Sodium: 135 mmol/L (ref 135–145)
Total Bilirubin: 0.5 mg/dL (ref 0.3–1.2)
Total Protein: 7.6 g/dL (ref 6.5–8.1)

## 2022-05-29 LAB — PROTIME-INR
INR: 1 (ref 0.8–1.2)
Prothrombin Time: 12.9 seconds (ref 11.4–15.2)

## 2022-05-29 LAB — ETHANOL: Alcohol, Ethyl (B): <10 mg/dL (ref <10)

## 2022-05-29 LAB — APTT: aPTT: 29 seconds (ref 24–36)

## 2022-05-29 MED ORDER — SENNOSIDES-DOCUSATE SODIUM 8.6-50 MG PO TABS: 1.0000 | ORAL_TABLET | Freq: Every evening | ORAL | Status: DC | PRN

## 2022-05-29 MED ORDER — ATORVASTATIN CALCIUM 80 MG PO TABS
80.0000 mg | ORAL_TABLET | Freq: Every day | ORAL | Status: DC
  Administered 2022-05-29 – 2022-05-31 (×3): 80 mg via ORAL
  Filled 2022-05-29: qty 2
  Filled 2022-05-29 (×2): qty 1

## 2022-05-29 MED ORDER — CLOPIDOGREL BISULFATE 75 MG PO TABS
75.0000 mg | ORAL_TABLET | Freq: Every day | ORAL | Status: DC
  Administered 2022-05-30 – 2022-06-01 (×3): 75 mg via ORAL
  Filled 2022-05-29 (×4): qty 1

## 2022-05-29 MED ORDER — PREGABALIN 100 MG PO CAPS
200.0000 mg | ORAL_CAPSULE | Freq: Three times a day (TID) | ORAL | Status: DC
  Administered 2022-05-30 – 2022-06-01 (×7): 200 mg via ORAL
  Filled 2022-05-29: qty 2
  Filled 2022-05-29: qty 4
  Filled 2022-05-29 (×2): qty 2
  Filled 2022-05-29: qty 4
  Filled 2022-05-29 (×2): qty 2

## 2022-05-29 MED ORDER — METHOCARBAMOL 500 MG PO TABS: 500.0000 mg | ORAL_TABLET | Freq: Three times a day (TID) | ORAL | Status: DC | PRN

## 2022-05-29 MED ORDER — DICLOFENAC SODIUM 1 % EX GEL
4.0000 g | Freq: Four times a day (QID) | CUTANEOUS | Status: DC
  Administered 2022-05-30 – 2022-06-01 (×7): 4 g via TOPICAL
  Filled 2022-05-29 (×2): qty 100

## 2022-05-29 MED ORDER — OXYCODONE HCL 5 MG PO TABS
10.0000 mg | ORAL_TABLET | Freq: Four times a day (QID) | ORAL | Status: DC
  Administered 2022-05-30 – 2022-06-01 (×10): 10 mg via ORAL
  Filled 2022-05-29 (×10): qty 2

## 2022-05-29 MED ORDER — ACETAMINOPHEN 160 MG/5ML PO SOLN: 650.0000 mg | ORAL | Status: DC | PRN

## 2022-05-29 MED ORDER — HEPARIN SODIUM (PORCINE) 5000 UNIT/ML IJ SOLN
5000.0000 [IU] | Freq: Three times a day (TID) | INTRAMUSCULAR | Status: DC
  Administered 2022-05-29 – 2022-06-01 (×8): 5000 [IU] via SUBCUTANEOUS
  Filled 2022-05-29 (×8): qty 1

## 2022-05-29 MED ORDER — METOCLOPRAMIDE HCL 10 MG PO TABS: 10.0000 mg | ORAL_TABLET | Freq: Three times a day (TID) | ORAL | Status: DC | PRN

## 2022-05-29 MED ORDER — IOHEXOL 350 MG/ML SOLN
80.0000 mL | Freq: Once | INTRAVENOUS | Status: AC | PRN
  Administered 2022-05-29: 80 mL via INTRAVENOUS

## 2022-05-29 MED ORDER — ACETAMINOPHEN 325 MG PO TABS: 650.0000 mg | ORAL_TABLET | ORAL | Status: DC | PRN

## 2022-05-29 MED ORDER — ASPIRIN 325 MG PO TABS: 325.0000 mg | ORAL_TABLET | Freq: Every day | ORAL | Status: DC

## 2022-05-29 MED ORDER — CLOPIDOGREL BISULFATE 300 MG PO TABS
300.0000 mg | ORAL_TABLET | Freq: Once | ORAL | Status: AC
  Administered 2022-05-29: 300 mg via ORAL
  Filled 2022-05-29: qty 1

## 2022-05-29 MED ORDER — INSULIN ASPART 100 UNIT/ML IJ SOLN
0.0000 [IU] | INTRAMUSCULAR | Status: DC
  Administered 2022-05-29: 5 [IU] via SUBCUTANEOUS
  Administered 2022-05-30: 3 [IU] via SUBCUTANEOUS
  Filled 2022-05-29: qty 0.15

## 2022-05-29 MED ORDER — ACETAMINOPHEN 650 MG RE SUPP: 650.0000 mg | RECTAL | Status: DC | PRN

## 2022-05-29 MED ORDER — STROKE: EARLY STAGES OF RECOVERY BOOK
Freq: Once | Status: DC
  Filled 2022-05-29: qty 1

## 2022-05-29 MED ORDER — POTASSIUM CHLORIDE IN NACL 20-0.9 MEQ/L-% IV SOLN
INTRAVENOUS | Status: DC
  Filled 2022-05-29 (×4): qty 1000

## 2022-05-29 MED ORDER — LIDOCAINE 5 % EX PTCH: 1.0000 | MEDICATED_PATCH | Freq: Every day | CUTANEOUS | Status: DC | PRN

## 2022-05-29 NOTE — H&P (Incomplete)
PCP:   [name], MD   Chief Complaint:  Left lower extremity weakness  HPI: This is a 52 year old female with history of chronic back pain on chronic opioids s/p surgery and spinal stimulator placement, fibromyalgia, anxiety and depression, DM type 2, gastroparesis, peripheral neuropathy, HTN, HLD and gastroparesis.  Yesterday she woke with a bad headache that was located behind her eyes, temporal jawbone.  Around 3 PM she developed numbness in her left leg that was intermittent.  This continued into today.  Today she she tries to stand and her left leg was wobbly, she feels as though she had no control of it.  She additionally has some mild numbness in the left arm but nothing as bad as of the extremity.  She came to the ER.  Patient is maintained on atorvastatin 20 mg daily however she takes it approximately q2 weeks.  She states it causes her nausea and vomiting  CT head and neck done in the ER shows  Severe stenosis in the distal right ICA, just proximal to the terminus. Moderate stenosis in the proximal left A1 and mild stenosis in the bilateral cavernous and supraclinoid ICAs.  CT head negative  The patient has a known history of bilateral carotid stenosis.  In 04/11/21 she saw Dr Luretha Murphy vascular surgery.  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  This would be best managed by neurosurgery.  In an effort to decrease her risk profile, I started the patient on dual antiplatelet therapy.  I asked her to continue her high intensity statin-Lipitor.  She followed with NS at [facility], 04/19/21 Dr Mike Gip who wrote for asymptomatic intracranial stenosis we recommend monoantiplatet therapy with Aspirin '81mg'$  and cholesterol medication.   Review of Systems:  The patient denies anorexia, fever, weight loss,, vision loss, decreased hearing, hoarseness, chest pain, syncope, dyspnea on exertion, peripheral edema, balance deficits, hemoptysis, abdominal pain,  melena, hematochezia, severe indigestion/heartburn, hematuria, incontinence, genital sores, muscle weakness, suspicious skin lesions, transient blindness, difficulty walking, depression, unusual weight change, abnormal bleeding, enlarged lymph nodes, angioedema, and breast masses. Positive: Left lower extremity weakness, numbness, headache  Past Medical History: Past Medical History:  Diagnosis Date   Anemia    Anxiety    Arthritis    Chronic pain    Complication of anesthesia    anesthesia awareness,   DDD (degenerative disc disease)    DDD (degenerative disc disease)    Degenerative disc disease    Degenerative disc disease    Diabetes mellitus    Type 2 insulin resistant   Dysrhythmia    stress and caffeine related   Endometriosis    Fibroids    Fibromyalgia    GERD (gastroesophageal reflux disease)    High cholesterol    Insomnia    Leg cramps    PCOS (polycystic ovarian syndrome)    Scoliosis    Sickle cell trait ([place])    Past Surgical History:  Procedure Laterality Date   BACK SURGERY  2010   BIOPSY BREAST     CESAREAN SECTION     DILATATION & CURETTAGE/HYSTEROSCOPY WITH MYOSURE N/A 01/01/2015   Procedure: DILATATION & CURETTAGE/HYSTEROSCOPY WITH MYOSURE;  Surgeon: Crawford Givens, MD;  Location: [facility];  Service: Gynecology;  Laterality: N/A;   LAPAROSCOPY  2002 or 2003   TOE SURGERY  2008   WISDOM TOOTH EXTRACTION      Medications: Prior to Admission medications   Medication Sig Start Date End Date Taking?  Authorizing Provider  albuterol (VENTOLIN HFA) 108 (90 Base) MCG/ACT inhaler Inhale 1-2 puffs into the lungs every 6 (six) hours as needed for wheezing or shortness of breath. 04/29/22  Yes Mound, Hildred Alamin E, FNP  atorvastatin (LIPITOR) 20 MG tablet Take 1 tablet (20 mg total) by mouth daily at 6 PM. Patient taking differently: Take 20 mg by mouth daily. 08/01/18  Yes Black, Lezlie Octave, NP  ciclopirox (PENLAC) 8 % solution Apply 1 Application topically daily as needed  (Skin infection). 01/13/21  Yes [provider]  Insulin Aspart, w/Niacinamide, (FIASP) 100 UNIT/ML SOLN Inject 5-16 Units into the skin with breakfast, with lunch, and with evening meal. Per sliding scale 09/11/19  Yes [provider]  lidocaine (LIDODERM) 5 % Place 1 patch onto the skin daily. Remove & Discard patch within 12 hours or as directed by MD Patient taking differently: Place 1 patch onto the skin daily as needed (For pain). 08/23/19  Yes Palumbo, April, MD  methocarbamol (ROBAXIN) 500 MG tablet Take 1 tablet (500 mg total) by mouth every 8 (eight) hours as needed for muscle spasms. 11/10/20  Yes Maudie Flakes, MD  ondansetron (ZOFRAN-ODT) 4 MG disintegrating tablet Take 1 tablet (4 mg total) by mouth every 8 (eight) hours as needed for nausea or vomiting. 03/28/22  Yes Montine Circle, PA-C  Oxycodone HCl 10 MG TABS Take 10 mg by mouth every 6 (six) hours. 11/07/19  Yes [provider]  pregabalin (LYRICA) 200 MG capsule Take 200 mg by mouth 3 (three) times daily. 01/27/21  Yes [provider]  Semaglutide,0.25 or 0.'5MG'$ /DOS, (OZEMPIC, 0.25 OR 0.5 MG/DOSE,) 2 MG/1.5ML SOPN Inject 0.4 mLs into the skin once a week. 09/11/19  Yes [provider]  benzonatate (TESSALON) 100 MG capsule Take 1 capsule (100 mg total) by mouth 3 (three) times daily as needed for cough. Patient not taking: Reported on 05/29/2022 04/03/22   Truddie Hidden, MD  diclofenac Sodium (VOLTAREN) 1 % GEL Apply 4 g topically 4 (four) times daily. Patient not taking: Reported on 05/29/2022 02/26/22   Deno Etienne, DO  doxycycline (VIBRAMYCIN) 100 MG capsule Take 1 capsule (100 mg total) by mouth 2 (two) times daily. Patient not taking: Reported on 05/29/2022 04/29/22   Teodora Medici, FNP  fluconazole (DIFLUCAN) 150 MG tablet Take 1 tablet (150 mg total) by mouth daily. Take at first sign of vaginal yeast. Patient not taking: Reported on 05/29/2022 04/29/22   Teodora Medici, FNP   metoCLOPramide (REGLAN) 10 MG tablet Take 1 tablet (10 mg total) by mouth every 8 (eight) hours as needed for nausea. Patient not taking: Reported on 05/29/2022 04/03/22   Truddie Hidden, MD  promethazine-dextromethorphan (PROMETHAZINE-DM) 6.25-15 MG/5ML syrup Take 5 mLs by mouth 3 (three) times daily as needed for cough. Patient not taking: Reported on 05/29/2022 04/03/22   Truddie Hidden, MD    Allergies:   Allergies  Allergen Reactions   Amoxicillin Nausea And Vomiting   Amoxicillin-Pot Clavulanate Nausea And Vomiting   Macrobid [Nitrofurantoin Macrocrystal] Nausea Only    Headache and "severe abd cramps"   Rosuvastatin Nausea And Vomiting   Shellfish Allergy Swelling    Itching of lips and ears, NOT allergic to iodine contrast   Sulfa Antibiotics Other (See Comments)    Dizzy and sees spots with sulfa drugs   Nitrofurantoin Other (See Comments), Itching, Nausea Only and Photosensitivity    "seeing spots" Sees "white spots"     Social History:  reports that  she has never smoked. She has never used smokeless tobacco. She reports current alcohol use. She reports current drug use. Drug: Marijuana.  Family History: Family History  Problem Relation Age of Onset   Hyperlipidemia Mother    Heart attack Father 70   Diabetes Father    Hypertension Father    Hyperlipidemia Father    Kidney failure Father    Heart murmur Brother    Alzheimer's disease Maternal Grandmother    Brain cancer Maternal Grandfather    Diabetes Paternal Grandmother    Hypertension Paternal Grandmother    Hyperlipidemia Paternal Grandmother    Diabetes Paternal Grandfather    Hypertension Paternal Grandfather    Hyperlipidemia Paternal Grandfather     Physical Exam: Vitals:   05/29/22 1851 05/29/22 1941 05/29/22 2109 05/29/22 2212  BP:  (!) 141/72 (!) 154/70 (!) 143/78  Pulse:  88 82 83  Resp:  '13 13 10  '$ Temp:   98.6 F (37 C) 98.4 F (36.9 C)  TempSrc:    Oral  SpO2:  100% 100% 100%   Weight: 108.4 kg     Height: '5\' 8"'$  (1.727 m)       General:  Alert and oriented times three, well developed and nourished, no acute distress Eyes: PERRLA, pink conjunctiva, no scleral icterus ENT: Moist oral mucosa, neck supple, no thyromegaly, right carotid bruit Lungs: clear to ascultation, no wheeze, no crackles, no use of accessory muscles Cardiovascular: regular rate and rhythm, no regurgitation, no gallops, no murmurs. No carotid bruits, no JVD Abdomen: soft, positive BS, non-tender, non-distended, no organomegaly, not an acute abdomen GU: not examined Neuro: CN II - XII grossly intact, sensation intact Musculoskeletal: strength 5/5 all extremities except LLE 4/5. Sensation normal Skin: no rash, no subcutaneous crepitation, no decubitus Psych: appropriate patient   Labs on Admission:  Recent Labs    05/29/22 1654 05/29/22 1741  NA 135 138  K 3.9 3.8  CL 102 100  CO2 24  --   GLUCOSE 170* 166*  BUN 10 7  CREATININE 0.89 0.90  CALCIUM 8.2*  --    Recent Labs    05/29/22 1654  AST 22  ALT 11  ALKPHOS 96  BILITOT 0.5  PROT 7.6  ALBUMIN 3.6    Recent Labs    05/29/22 1654 05/29/22 1741  WBC 9.8  --   NEUTROABS 5.9  --   HGB 13.2 13.6  HCT 40.0 40.0  MCV 88.9  --   PLT 359  --     Radiological Exams on Admission: CT ANGIO HEAD NECK W WO CM  Result Date: 05/29/2022 CLINICAL DATA:  Numbness in the left arm, left leg; right headache and jaw pain EXAM: CT ANGIOGRAPHY HEAD AND NECK TECHNIQUE: Multidetector CT imaging of the head and neck was performed using the standard protocol during bolus administration of intravenous contrast. Multiplanar CT image reconstructions and MIPs were obtained to evaluate the vascular anatomy. Carotid stenosis measurements (when applicable) are obtained utilizing NASCET criteria, using the distal internal carotid diameter as the denominator. RADIATION DOSE REDUCTION: This exam was performed according to the departmental  dose-optimization program which includes automated exposure control, adjustment of the mA and/or kV according to patient size and/or use of iterative reconstruction technique. CONTRAST:  64m OMNIPAQUE IOHEXOL 350 MG/ML SOLN COMPARISON:  03/17/2021 CTA head and neck, correlation is also made with 04/03/2022 CT head FINDINGS: CT HEAD FINDINGS Brain: No evidence of acute infarct, hemorrhage, mass, mass effect, or midline shift. No hydrocephalus or  extra-axial fluid collection. Vascular: No hyperdense vessel. Skull: Negative for fracture or focal lesion. Sinuses/Orbits: No acute finding. Other: The mastoid air cells are well aerated. CTA NECK FINDINGS Aortic arch: Two-vessel arch with a common origin of the brachiocephalic and left common carotid arteries. Imaged portion shows no evidence of aneurysm or dissection. No significant stenosis of the major arch vessel origins. Right carotid system: No evidence of dissection, occlusion, or hemodynamically significant stenosis (greater than 50%). Left carotid system: No evidence of dissection, occlusion, or hemodynamically significant stenosis (greater than 50%). Vertebral arteries: No evidence of dissection, occlusion, or hemodynamically significant stenosis (greater than 50%). Skeleton: No acute osseous abnormality. Degenerative changes in the cervical spine. Edentulous. Other neck: Negative. Upper chest: Negative. Review of the MIP images confirms the above findings CTA HEAD FINDINGS Anterior circulation: Both internal carotid arteries are patent to the termini, with mild stenosis in the bilateral cavernous and supraclinoid ICAs. Severe stenosis in the distal right ICA, just proximal to the terminus (series 11, image 244). A1 segments patent, with moderate stenosis in the proximal left A1. Normal anterior communicating artery. Anterior cerebral arteries are patent to their distal aspects. No M1 stenosis or occlusion. MCA branches perfused and symmetric. Posterior  circulation: Vertebral arteries patent to the vertebrobasilar junction without significant stenosis posterior inferior cerebellar arteries patent proximally. Basilar patent to its distal aspect. Superior cerebellar arteries patent proximally. Patent P1 segments. PCAs perfused to their distal aspects without stenosis. The bilateral posterior communicating arteries are patent. Venous sinuses: As permitted by contrast timing, patent. Anatomic variants: None significant. Review of the MIP images confirms the above findings IMPRESSION: 1. No acute intracranial process. 2. Severe stenosis in the distal right ICA, just proximal to the terminus. Moderate stenosis in the proximal left A1 and mild stenosis in the bilateral cavernous and supraclinoid ICAs. 3. No hemodynamically significant stenosis in the neck. Electronically Signed   By: Merilyn Baba M.D.   On: 05/29/2022 19:05    Assessment/Plan Present on Admission:  TIA (transient ischemic attack)/ Carotid artery stenosis -Transfer to [facility] -Patient has a spine stimultor but doesn't have the remote with her to place in MR mode.  Per patient her spinal stimulator is MRI compatible however she has not documentation to this effect -Neurology consulted. Plavix 300 mg once, 75 mg daily, ASA 325 mg, '81mg'$  daily, high-dose statin ordered.  Neurochecks, permissive hypertension, lipid panel initiated -Repeat 2D echo -MRI vs CT head 24 hours after per a.m. team -Implement neuro recommendation TSH, ESR, RPR, A1c.  MRI cervical spine per a.m. team  Diabetes mellitus type 2/gastroparesis -patient has a insulin pump in place -sliding scale insulin ordered -Continue Reglan  Hyperlipidemia -High-dose statin ordered.  Patient tolerated in the ER  -Lipid panel ordered   Obstructive sleep apnea -does not use CPAP machine. ASPIRE implant planned  Chronic pain -patient with spinal stimulator, it is not charged -Continue Lyrica, Voltaren gel, Robaxin and  oxycodone   Cleone Hulick 05/29/2022, 10:34 PM

## 2022-05-29 NOTE — ED Provider Notes (Signed)
[facility] Provider Note   CSN: [id number] Arrival date & time: 05/29/22  1634     History  No chief complaint on file.   Karen Stein is a 52 y.o. female.  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  The patient states that she was last normal around 10 AM yesterday.  She has since developed numbness in the left arm, left leg.  She endorses a right-sided headache with some associated jaw pain.  She denies any slurred speech, does endorse some blurry vision.  She would been referred outpatient to vascular for evaluation of her carotids due to concern for possible carotid stenosis.  She was sent from her PCP today due to concern for possible stroke.  She states that she has ongoing left-sided numbness.  She denies any double vision.  She denies any facial droop, difficulty swallowing or speaking.  She denies any focal weakness.  She denies any room spinning dizziness.  She endorses a headache, located frontotemporal.  No fevers, neck stiffness.  Home Medications Prior to Admission medications   Medication Sig Start Date End Date Taking? Authorizing Provider  albuterol (VENTOLIN HFA) 108 (90 Base) MCG/ACT inhaler Inhale 1-2 puffs into the lungs every 6 (six) hours as needed for wheezing or shortness of breath. 04/29/22   Teodora Medici, FNP  atorvastatin (LIPITOR) 20 MG tablet Take 1 tablet (20 mg total) by mouth daily at 6 PM. 08/01/18   Black, Lezlie Octave, NP  benzonatate (TESSALON) 100 MG capsule Take 1 capsule (100 mg total) by mouth 3 (three) times daily as needed for cough. 04/03/22   Truddie Hidden, MD  ciclopirox (PENLAC) 8 % solution Apply topically. 01/13/21   [provider]  diclofenac Sodium (VOLTAREN) 1 % GEL Apply 4 g topically 4  (four) times daily. 02/26/22   Deno Etienne, DO  doxycycline (VIBRAMYCIN) 100 MG capsule Take 1 capsule (100 mg total) by mouth 2 (two) times daily. 04/29/22   Teodora Medici, FNP  fluconazole (DIFLUCAN) 150 MG tablet Take 1 tablet (150 mg total) by mouth daily. Take at first sign of vaginal yeast. 04/29/22   Teodora Medici, FNP  Insulin Aspart, w/Niacinamide, (FIASP) 100 UNIT/ML SOLN Inject 5-16 Units into the skin with breakfast, with lunch, and with evening meal. Per sliding scale 09/11/19   [provider]  lidocaine (LIDODERM) 5 % Place 1 patch onto the skin daily. Remove & Discard patch within 12 hours or as directed by MD 08/23/19   Randal Buba, April, MD  methocarbamol (ROBAXIN) 500 MG tablet Take 1 tablet (500 mg total) by mouth every 8 (eight) hours as needed for muscle spasms. 11/10/20   Maudie Flakes, MD  metoCLOPramide (REGLAN) 10 MG tablet Take 1 tablet (10 mg total) by mouth every 8 (eight) hours as needed for nausea. 04/03/22   Truddie Hidden, MD  ondansetron (ZOFRAN-ODT) 4 MG disintegrating tablet Take 1 tablet (4 mg total) by mouth every 8 (eight) hours as needed for nausea or vomiting. 03/28/22   Montine Circle, PA-C  Oxycodone HCl 10 MG TABS Take 10 mg by mouth every 6 (six) hours. 11/07/19   [provider]  pregabalin (LYRICA) 200 MG capsule Take 200 mg by mouth 3 (three) times daily. 01/27/21   [provider]  promethazine-dextromethorphan (PROMETHAZINE-DM) 6.25-15 MG/5ML syrup Take 5 mLs by mouth 3 (three) times daily as needed for cough. 04/03/22   Truddie Hidden, MD  Semaglutide,0.25 or 0.'5MG'$ /DOS, (OZEMPIC, 0.25 OR 0.5 MG/DOSE,) 2 MG/1.5ML SOPN Inject 0.4 mLs into the skin once a week. 09/11/19   [provider]      Allergies    Amoxicillin, Amoxicillin-pot clavulanate, Macrobid [nitrofurantoin macrocrystal], Rosuvastatin, Shellfish allergy, Sulfa antibiotics, and Nitrofurantoin    Review of Systems   Review of Systems  All other systems  reviewed and are negative.   Physical Exam Updated Vital Signs LMP 11/13/2015 (Exact Date)  Physical Exam Vitals and nursing note reviewed.  Constitutional:      General: She is not in acute distress.    Appearance: She is well-developed.  HENT:     Head: Normocephalic and atraumatic.  Eyes:     Conjunctiva/sclera: Conjunctivae normal.  Cardiovascular:     Rate and Rhythm: Normal rate and regular rhythm.  Pulmonary:     Effort: Pulmonary effort is normal. No respiratory distress.     Breath sounds: Normal breath sounds.  Abdominal:     Palpations: Abdomen is soft.     Tenderness: There is no abdominal tenderness.  Musculoskeletal:        General: No swelling.     Cervical back: Neck supple.  Skin:    General: Skin is warm and dry.     Capillary Refill: Capillary refill takes less than 2 seconds.  Neurological:     Mental Status: She is alert.     Comments: MENTAL STATUS EXAM:    Orientation: Alert and oriented to person, place and time.  Memory: Cooperative, follows commands well.  Language: Speech is clear and language is normal.   CRANIAL NERVES:    CN 2 (Optic): Visual fields intact to confrontation.  CN 3,4,6 (EOM): Pupils equal and reactive to light. Full extraocular eye movement without nystagmus.  CN 5 (Trigeminal): Facial sensation is normal, no weakness of masticatory muscles.  CN 7 (Facial): No facial weakness or asymmetry.  CN 8 (Auditory): Auditory acuity grossly normal.  CN 9,10 (Glossophar): The uvula is midline, the palate elevates symmetrically.  CN 11 (spinal access): Normal sternocleidomastoid and trapezius strength.  CN 12 (Hypoglossal): The tongue is midline. No atrophy or fasciculations.Marland Kitchen   MOTOR:  Muscle Strength: 5/5RUE, 5/5LUE, 5/5RLE, 5/5LLE.   COORDINATION:   Intact finger-to-nose, no tremor.   SENSATION:   Intact to light touch all four extremities, SUBJECTIVE DECREASED SENSATION IN THE LUE AND LLE.  GAIT: Gait not assessed    Psychiatric:        Mood and Affect: Mood normal.     ED Results / Procedures / Treatments   Labs (all labs ordered are listed, but only abnormal results are displayed) Labs Reviewed - No data to display  EKG None  Radiology No results found.  Procedures Procedures    Medications Ordered in ED Medications - No data to display  ED Course/ Medical Decision Making/ A&P                             Medical Decision Making Amount and/or Complexity of Data Reviewed Labs: ordered. Radiology: ordered.  Risk Prescription drug management.   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  The patient states that she was last normal around 10 AM yesterday.  She has since developed numbness in the left arm, left leg.  She endorses a right-sided headache with some associated jaw pain.  She denies any slurred speech, does endorse some blurry vision.  She would been referred outpatient to vascular for evaluation of her carotids due to concern for possible carotid stenosis.  She was sent from her PCP today due to concern for possible stroke.  She states that she has ongoing left-sided numbness.  She denies any double vision.  She denies any facial droop, difficulty swallowing or speaking.  She denies any focal weakness.  She denies any room spinning dizziness.  She endorses a headache, located frontotemporal.  No fevers, neck stiffness.  On arrival, the patient was vitally stable. NSR noted on telemetry. Physical exam significant for subjective numbness in the left hemibody.  Patient last known normal 10 AM yesterday.  Code stroke not activated due to patient being outside the window for TNK.  {Document critical care time when appropriate:1} {Document review of labs and clinical decision tools ie heart  score, Chads2Vasc2 etc:1}  {Document your independent review of radiology images, and any outside records:1} {Document your discussion with family members, caretakers, and with consultants:1} {Document social determinants of health affecting pt's care:1} {Document your decision making why or why not admission, treatments were needed:1} Final Clinical Impression(s) / ED Diagnoses Final diagnoses:  None    Rx / DC Orders ED Discharge Orders     None

## 2022-05-29 NOTE — ED Triage Notes (Signed)
Numbness left arm, left leg, R headache and jaw pain., denied slurred speech, some blurry vision. Onset yesterday. Headache comes and goes 9 out 10.

## 2022-05-30 ENCOUNTER — Inpatient Hospital Stay (HOSPITAL_COMMUNITY): Payer: No Typology Code available for payment source

## 2022-05-30 DIAGNOSIS — G459 Transient cerebral ischemic attack, unspecified: Secondary | ICD-10-CM | POA: Diagnosis not present

## 2022-05-30 DIAGNOSIS — R200 Anesthesia of skin: Secondary | ICD-10-CM | POA: Diagnosis not present

## 2022-05-30 DIAGNOSIS — E782 Mixed hyperlipidemia: Secondary | ICD-10-CM

## 2022-05-30 DIAGNOSIS — E119 Type 2 diabetes mellitus without complications: Secondary | ICD-10-CM | POA: Diagnosis not present

## 2022-05-30 DIAGNOSIS — I6521 Occlusion and stenosis of right carotid artery: Secondary | ICD-10-CM | POA: Diagnosis not present

## 2022-05-30 LAB — CBC
HCT: 39.4 % (ref 36.0–46.0)
Hemoglobin: 12.8 g/dL (ref 12.0–15.0)
MCH: 28.6 pg (ref 26.0–34.0)
MCHC: 32.5 g/dL (ref 30.0–36.0)
MCV: 87.9 fL (ref 80.0–100.0)
Platelets: 334 10*3/uL (ref 150–400)
RBC: 4.48 MIL/uL (ref 3.87–5.11)
RDW: 13.3 % (ref 11.5–15.5)
WBC: 8.7 10*3/uL (ref 4.0–10.5)
nRBC: 0 % (ref 0.0–0.2)

## 2022-05-30 LAB — BASIC METABOLIC PANEL
Anion gap: 7 (ref 5–15)
BUN: 11 mg/dL (ref 6–20)
CO2: 26 mmol/L (ref 22–32)
Calcium: 8.4 mg/dL — ABNORMAL LOW (ref 8.9–10.3)
Chloride: 105 mmol/L (ref 98–111)
Creatinine, Ser: 0.87 mg/dL (ref 0.44–1.00)
GFR, Estimated: >60 mL/min (ref >60)
Glucose, Bld: 111 mg/dL — ABNORMAL HIGH (ref 70–99)
Potassium: 4.3 mmol/L (ref 3.5–5.1)
Sodium: 138 mmol/L (ref 135–145)

## 2022-05-30 LAB — HEMOGLOBIN A1C
Hgb A1c MFr Bld: 10 % — ABNORMAL HIGH (ref 4.8–5.6)
Mean Plasma Glucose: 240.3 mg/dL

## 2022-05-30 LAB — ECHOCARDIOGRAM COMPLETE
Area-P 1/2: 4.1 cm2
Calc EF: 65.6 %
Height: 68 in
S' Lateral: 2.5 cm
Single Plane A2C EF: 67.4 %
Single Plane A4C EF: 63.8 %
Weight: 3824 oz

## 2022-05-30 LAB — LIPID PANEL
Cholesterol: 202 mg/dL — ABNORMAL HIGH (ref 0–200)
HDL: 31 mg/dL — ABNORMAL LOW (ref >40)
LDL Cholesterol: 155 mg/dL — ABNORMAL HIGH (ref 0–99)
Total CHOL/HDL Ratio: 6.5 RATIO
Triglycerides: 81 mg/dL (ref <150)
VLDL: 16 mg/dL (ref 0–40)

## 2022-05-30 LAB — RPR: RPR Ser Ql: NONREACTIVE

## 2022-05-30 LAB — CBG MONITORING, ED
Glucose-Capillary: 108 mg/dL — ABNORMAL HIGH (ref 70–99)
Glucose-Capillary: 111 mg/dL — ABNORMAL HIGH (ref 70–99)
Glucose-Capillary: 158 mg/dL — ABNORMAL HIGH (ref 70–99)
Glucose-Capillary: 161 mg/dL — ABNORMAL HIGH (ref 70–99)
Glucose-Capillary: 213 mg/dL — ABNORMAL HIGH (ref 70–99)
Glucose-Capillary: 248 mg/dL — ABNORMAL HIGH (ref 70–99)
Glucose-Capillary: 253 mg/dL — ABNORMAL HIGH (ref 70–99)
Glucose-Capillary: 91 mg/dL (ref 70–99)

## 2022-05-30 LAB — SEDIMENTATION RATE: Sed Rate: 30 mm/hr — ABNORMAL HIGH (ref 0–22)

## 2022-05-30 LAB — HIV ANTIBODY (ROUTINE TESTING W REFLEX): HIV Screen 4th Generation wRfx: NONREACTIVE

## 2022-05-30 LAB — TSH: TSH: 1.631 u[IU]/mL (ref 0.350–4.500)

## 2022-05-30 MED ORDER — INSULIN ASPART 100 UNIT/ML IJ SOLN
0.0000 [IU] | Freq: Three times a day (TID) | INTRAMUSCULAR | Status: DC
  Administered 2022-05-30: 2 [IU] via SUBCUTANEOUS
  Administered 2022-05-30 – 2022-05-31 (×2): 3 [IU] via SUBCUTANEOUS
  Administered 2022-05-31: 9 [IU] via SUBCUTANEOUS
  Administered 2022-05-31 – 2022-06-01 (×2): 7 [IU] via SUBCUTANEOUS
  Administered 2022-06-01: 9 [IU] via SUBCUTANEOUS
  Filled 2022-05-30: qty 0.09

## 2022-05-30 MED ORDER — INSULIN GLARGINE-YFGN 100 UNIT/ML ~~LOC~~ SOLN
7.0000 [IU] | Freq: Every day | SUBCUTANEOUS | Status: DC
  Administered 2022-05-30: 7 [IU] via SUBCUTANEOUS
  Filled 2022-05-30 (×3): qty 0.07

## 2022-05-30 MED ORDER — ASPIRIN 81 MG PO TBEC
81.0000 mg | DELAYED_RELEASE_TABLET | Freq: Every day | ORAL | Status: DC
  Administered 2022-05-30 – 2022-06-01 (×3): 81 mg via ORAL
  Filled 2022-05-30 (×3): qty 1

## 2022-05-30 MED ORDER — INSULIN ASPART 100 UNIT/ML IJ SOLN
0.0000 [IU] | Freq: Every day | INTRAMUSCULAR | Status: DC
  Administered 2022-05-30: 3 [IU] via SUBCUTANEOUS
  Administered 2022-05-31: 5 [IU] via SUBCUTANEOUS
  Filled 2022-05-30: qty 0.05

## 2022-05-30 NOTE — Consult Note (Addendum)
Neurology Consultation Reason for Consult: left sided numbness/weakness Requesting Physician: Regan Lemming  CC: Left leg numbness / weakness  History is obtained from: Patient and chart review  HPI: Karen Stein is a 52 y.o. right-handed woman with past medical history significant for diabetes, hyperlipidemia, obstructive sleep apnea not currently on CPAP, BMI 36, chronic back pain s/p neurostimulator placement July 2022, anxiety/depression.  She notes that she was dealing with her dogs yesterday when she felt like she took a misstep causing her to stumble slightly but not fall.  Since then she has had increased lower back pain associated with left-sided radicular symptoms.  She notes some chronic constipation secondary to opiate use, and some cramps in her torso when she is wiping herself after urination/defecation, but feels this is actually fairly similar to baseline.  Her left shin numbness occasionally radiates up into the leg and at times is involved her though the symptoms in her leg have been far more predominant.  She also has had some mild right-sided headache and some intermittent blurry vision.  She notes that she has had some bilateral lower extremity cramping left greater than right when straining.  She has not tolerated CPAP in the past and therefore is not currently on this treatment.  There is also note that she did not tolerate atorvastatin due to vomiting in the past.  She does feel like she is having a fibromyalgia related pain crisis and in this setting is requesting IV pain medication.  She notes some frustration and difficulty scheduling MRI eyes subsequent to her experimental spinal cord stimulator implant after study was completed  She notes her antiplatelet therapy was de-escalated from dual antiplatelet therapy to Plavix alone a few months ago LKW: Sunday at about 11 AM Thrombolytic given?: No, out of the window IA performed?: No, exam not c/w LVO Premorbid modified  rankin scale:      2 - Slight disability. Able to look after own affairs without assistance, but unable to carry out all previous activities.(Ambulates with cane due to pain)   ROS: All other review of systems was negative except as noted in the HPI.   Past Medical History:  Diagnosis Date   Anemia    Anxiety    Arthritis    Chronic pain    Complication of anesthesia    anesthesia awareness,   DDD (degenerative disc disease)    DDD (degenerative disc disease)    Degenerative disc disease    Degenerative disc disease    Diabetes mellitus    Type 2 insulin resistant   Dysrhythmia    stress and caffeine related   Endometriosis    Fibroids    Fibromyalgia    GERD (gastroesophageal reflux disease)    High cholesterol    Insomnia    Leg cramps    PCOS (polycystic ovarian syndrome)    Scoliosis    Sickle cell trait ([place])    Past Surgical History:  Procedure Laterality Date   BACK SURGERY  2010   BIOPSY BREAST     CESAREAN SECTION     DILATATION & CURETTAGE/HYSTEROSCOPY WITH MYOSURE N/A 01/01/2015   Procedure: DILATATION & CURETTAGE/HYSTEROSCOPY WITH MYOSURE;  Surgeon: Crawford Givens, MD;  Location: [facility];  Service: Gynecology;  Laterality: N/A;   LAPAROSCOPY  2002 or 2003   TOE SURGERY  2008   WISDOM TOOTH EXTRACTION     Current Outpatient Medications  Medication Instructions   albuterol (VENTOLIN HFA) 108 (90 Base) MCG/ACT inhaler 1-2 puffs, Inhalation,  Every 6 hours PRN   atorvastatin (LIPITOR) 20 mg, Oral, Daily-1800   benzonatate (TESSALON) 100 mg, Oral, 3 times daily PRN   ciclopirox (PENLAC) 8 % solution 1 Application, Topical, Daily PRN   diclofenac Sodium (VOLTAREN) 4 g, Topical, 4 times daily   doxycycline (VIBRAMYCIN) 100 mg, Oral, 2 times daily   Fiasp 5-16 Units, Subcutaneous, 3 times daily with meals, Per sliding scale   fluconazole (DIFLUCAN) 150 mg, Oral, Daily, Take at first sign of vaginal yeast.   lidocaine (LIDODERM) 5 % 1 patch, Transdermal, Every 24  hours, Remove & Discard patch within 12 hours or as directed by MD   methocarbamol (ROBAXIN) 500 mg, Oral, Every 8 hours PRN   metoCLOPramide (REGLAN) 10 mg, Oral, Every 8 hours PRN   ondansetron (ZOFRAN-ODT) 4 mg, Oral, Every 8 hours PRN   Oxycodone HCl 10 mg, Oral, Every 6 hours   pregabalin (LYRICA) 200 mg, Oral, 3 times daily   promethazine-dextromethorphan (PROMETHAZINE-DM) 6.25-15 MG/5ML syrup 5 mLs, Oral, 3 times daily PRN   Semaglutide,0.25 or 0.'5MG'$ /DOS, (OZEMPIC, 0.25 OR 0.5 MG/DOSE,) 2 MG/1.5ML SOPN 0.4 mLs, Subcutaneous, Weekly    Family History  Problem Relation Age of Onset   Hyperlipidemia Mother    Heart attack Father 36   Diabetes Father    Hypertension Father    Hyperlipidemia Father    Kidney failure Father    Heart murmur Brother    Alzheimer's disease Maternal Grandmother    Brain cancer Maternal Grandfather    Diabetes Paternal Grandmother    Hypertension Paternal Grandmother    Hyperlipidemia Paternal Grandmother    Diabetes Paternal Grandfather    Hypertension Paternal Grandfather    Hyperlipidemia Paternal Grandfather    Social History:  reports that she has never smoked. She has never used smokeless tobacco. She reports current alcohol use. She reports current drug use. Drug: Marijuana.   Exam: Current vital signs: BP (!) 143/78   Pulse 83   Temp 98.4 F (36.9 C) (Oral)   Resp 10   Ht '5\' 8"'$  (1.727 m)   Wt 108.4 kg   LMP 11/13/2015 (Exact Date)   SpO2 100%   BMI 36.34 kg/m  Vital signs in last 24 hours: Temp:  [97.8 F (36.6 C)-98.6 F (37 C)] 98.4 F (36.9 C) (01/29 2212) Pulse Rate:  [82-89] 83 (01/29 2212) Resp:  [10-27] 10 (01/29 2212) BP: (129-164)/(65-78) 143/78 (01/29 2212) SpO2:  [100 %] 100 % (01/29 2212) Weight:  [108.4 kg] 108.4 kg (01/29 1851)   Physical Exam  Constitutional: Appears well-developed and well-nourished.  Psych: Affect appropriate to situation, pleasant and cooperative Eyes: No scleral injection HENT: No  oropharyngeal obstruction.  MSK: no joint deformities.  Cardiovascular: Normal rate and regular rhythm. Perfusing extremities well Respiratory: Effort normal, non-labored breathing GI: Soft.  No distension. There is no tenderness.  Skin: Warm dry and intact visible skin  Neuro: Mental Status: Patient is awake, alert, oriented to person, place, month, year, and situation. Patient is able to give a clear and coherent history. No signs of aphasia or neglect Cranial Nerves: II: Visual Fields are full. Pupils are equal, round, and reactive to light.   III,IV, VI: EOMI without ptosis or diploplia.  V: Facial sensation is symmetric to temperature VII: Facial movement is symmetric.  VIII: hearing is intact to voice X: Uvula elevates symmetrically XI: Shoulder shrug is symmetric. XII: tongue is midline without atrophy or fasciculations.  Motor: 5/5 strength was present in all four extremities, except for  left hip flexion 4/5.  No pronator drift. Sensory: At the time of my evaluation she reports that the left and right leg sensation is similar except for just slightly to least Deep Tendon Reflexes: 1+ and symmetric in the brachioradialis and patellae.  Cerebellar: FNF and HKS are intact bilaterally Gait:  Deferred   NIHSS total 2 Score breakdown: Sensory change in the left lower extremity and drift in the left lower extremity Performed at 1:30 AM  I have reviewed labs in epic and the results pertinent to this consultation are:  Basic Metabolic Panel: Recent Labs  Lab 05/29/22 1654 05/29/22 1741 05/29/22 2322  NA 135 138  --   K 3.9 3.8  --   CL 102 100  --   CO2 24  --   --   GLUCOSE 170* 166*  --   BUN 10 7  --   CREATININE 0.89 0.90 0.91  CALCIUM 8.2*  --   --     CBC: Recent Labs  Lab 05/29/22 1654 05/29/22 1741 05/29/22 2322  WBC 9.8  --  9.2  NEUTROABS 5.9  --   --   HGB 13.2 13.6 12.7  HCT 40.0 40.0 38.3  MCV 88.9  --  87.2  PLT 359  --  324     Coagulation Studies: Recent Labs    05/29/22 1654  LABPROT 12.9  INR 1.0      I have reviewed the images obtained:  CTA personally reviewed, agree with radiology:   1. No acute intracranial process. 2. Severe stenosis in the distal right ICA, just proximal to the terminus. Moderate stenosis in the proximal left A1 and mild stenosis in the bilateral cavernous and supraclinoid ICAs. 3. No hemodynamically significant stenosis in the neck.  09/08/20 MRI c-spine personally reviewed, agree with radiology:   1. Left paracentral disc protrusions at C4-5 and C6-7 with secondary mild spinal stenosis and flattening of the left hemi cord. Associated mild to moderate left C7 foraminal stenosis. Findings could contribute to left-sided symptoms. 2. Left eccentric disc osteophyte complex at C5-6 with resultant mild spinal stenosis, with mild to moderate left worse than right C6 foraminal narrowing.  Impression: Overall I favor the patient's etiology of fluctuating left lower > upper extremity numbness and weakness to be secondary to mild spinal cord issue from her misstep given that this was correlated to symptom onset and she has a known history of cervical degenerative disc disease.  However given her risk factors, stuttering lacunar stroke is also a possibility, as is symptoms from her right intracranial ICA stenosis (stroke vs. TIA)  Recommendations: # Possible stuttering lacunar right sided stroke, versus symptomatic right intracranial ICA stenosis, versus spinal cord - Stroke labs TSH, ESR, RPR, HgbA1c, fasting lipid panel - MRI brain and cervical spine w/o contrast - Frequent neuro checks - Echocardiogram - Aspirin - dose '325mg'$  PO or '300mg'$  PR, followed by 81 mg daily - Continue home 75 mg Plavix daily - Risk factor modification - Telemetry monitoring - Blood pressure goal --normotension as she is out of the window for permissive hypertension - PT consult, OT consult, Speech  consult, unless patient is back to baseline - Stroke team to follow  [place] [phone] Available 7 PM to 7 AM, outside of these hours please call Neurologist on call as listed on Amion.

## 2022-05-30 NOTE — ED Notes (Signed)
[facility] contacted for transport  

## 2022-05-30 NOTE — Progress Notes (Signed)
[facility]                                                                              Karen Stein, is a 52 y.o. female, DOB - 30-Apr-1971, IRS:[id number] Admit date - 05/29/2022    Outpatient Primary MD for the patient is Osei-Bonsu, Iona Beard, MD  LOS - 1  days  Chief Complaint  Patient presents with   Numbness       Brief summary   Patient is a 52 year old female with diabetes mellitus,HLP, obstructive sleep apnea, not on CPAP, chronic back pain s/p neurostimulator in July 2022, anxiety, depression, peripheral neuropathy, gastroparesis presented with left lower extremity weakness.  Patient reported that she woke up, a day before the admission with a bad headache and around 3 PM,  she developed numbness in her left leg that was intermittent.  It persisted onto the next day.  On the day of admission she tried to stand up and her left leg was wobbly as if she had no control of it.  She also reported mild numbness in the left arm as well.  She has not tolerated CPAP in the past..  Also reports that she did not tolerate atorvastatin due to vomiting. CTA head and neck showed severe stenosis in distal right RCA, just proximal to the terminus, moderate stenosis in proximal left A1 and mild stenosis in the bilateral cavernous and supraclinoid ICAs.  CT head negative. Per patient, left lower extremity weakness is improving.   Assessment & Plan    Principal Problem:   TIA (transient ischemic attack) with left-sided numbness and L LE weakness -Seen by neurology, possible stuttering right-sided lacunar infarct versus right ICA stenosis, symptoms improving -CTA head and neck showed no acute process, severe stenosis of the distal right ICA, just proximal to the terminus.  Moderate stenosis in the proximal left A1 MR stenosis and bilateral cavernous and supraclinoid ICA -MRI brain and C-spine pending, 2D echo pending -Neurology has evaluated, recommended continue Plavix 75 mg  daily, added aspirin 81 mg daily  -Patient placed on Lipitor 80 mg daily, LDL 155, HDL 31, cholesterol 202 -Hemoglobin A1c 10.0, TSH 1.6 -BP control  Active Problems:    Mixed hyperlipidemia -placed on Lipitor 80 mg daily, LDL 155, HDL 31, cholesterol 202      Type 2 diabetes mellitus without complication, with long-term current use of insulin ([place]), uncontrolled with hyperglycemia -Hemoglobin A1c 10.0 -Started on Semglee 7 units nightly, placed on sensitive sliding scale insulin -Diabetic coordinator consult     Carotid artery stenosis -Continue aspirin, Plavix, statin, will follow neurology recommendations  History of gastroparesis -Continue Reglan  Diabetic neuropathy -Continue Lyrica, Robaxin, oxycodone  Obesity Estimated body mass index is 36.34 kg/m as calculated from the following:   Height as of this encounter: '5\' 8"'$  (1.727 m).   Weight as of this encounter: 108.4 kg.  Code Status: Full CODE STATUS DVT Prophylaxis:  heparin injection 5,000 Units Start: 05/29/22 2230 SCD's Start: 05/29/22 2224   Level of Care: Level of care: Telemetry Medical Family Communication: Updated patient Disposition Plan:      Remains inpatient appropriate: Awaiting transfer to  [facility] complete stroke workup  Procedures:   Consultants:   Neurology  Antimicrobials: None    Medications   stroke: early stages of recovery book   Does not apply Once   aspirin EC  81 mg Oral Daily   atorvastatin  80 mg Oral QHS   clopidogrel  75 mg Oral Daily   diclofenac Sodium  4 g Topical QID   heparin  5,000 Units Subcutaneous Q8H   insulin aspart  0-15 Units Subcutaneous Q4H   oxyCODONE  10 mg Oral Q6H   pregabalin  200 mg Oral TID      Subjective:   Karen Stein was seen and examined today.  Left-sided numbness and weakness is improving.  Patient denies dizziness, chest pain, shortness of breath, abdominal pain, nausea or vomiting.   Objective:   Vitals:   05/29/22  2212 05/30/22 0246 05/30/22 0647 05/30/22 0900  BP: (!) 143/78 (!) 143/78 (!) 153/70 (!) 148/80  Pulse: 83 79 69 71  Resp: '10 17 10 14  '$ Temp: 98.4 F (36.9 C) 98.2 F (36.8 C) 98 F (36.7 C)   TempSrc: Oral  Oral   SpO2: 100% 100% 100% 100%  Weight:      Height:       No intake or output data in the 24 hours ending 05/30/22 0958   Wt Readings from Last 3 Encounters:  05/29/22 108.4 kg  04/03/22 108.9 kg  03/27/22 108.9 kg     Exam General: Alert and oriented x 3, NAD Cardiovascular: S1 S2 auscultated,  RRR Respiratory: Clear to auscultation bilaterally, no wheezing Gastrointestinal: Soft, nontender, nondistended, + bowel sounds Ext: no pedal edema bilaterally Neuro: Strength 5/5 upper and lower extremities bilaterally Psych: Normal affect and demeanor, alert and oriented x3     Data Reviewed:  I have personally reviewed following labs    CBC Lab Results  Component Value Date   WBC 8.7 05/30/2022   RBC 4.48 05/30/2022   HGB 12.8 05/30/2022   HCT 39.4 05/30/2022   MCV 87.9 05/30/2022   MCH 28.6 05/30/2022   PLT 334 05/30/2022   MCHC 32.5 05/30/2022   RDW 13.3 05/30/2022   LYMPHSABS 3.0 05/29/2022   MONOABS 0.6 05/29/2022   EOSABS 0.2 05/29/2022   BASOSABS 0.1 64/40/3474     Last metabolic panel Lab Results  Component Value Date   NA 138 05/30/2022   K 4.3 05/30/2022   CL 105 05/30/2022   CO2 26 05/30/2022   BUN 11 05/30/2022   CREATININE 0.87 05/30/2022   GLUCOSE 111 (H) 05/30/2022   GFRNONAA >60 05/30/2022   GFRAA >60 12/01/2019   CALCIUM 8.4 (L) 05/30/2022   PROT 7.6 05/29/2022   ALBUMIN 3.6 05/29/2022   BILITOT 0.5 05/29/2022   ALKPHOS 96 05/29/2022   AST 22 05/29/2022   ALT 11 05/29/2022   ANIONGAP 7 05/30/2022    CBG (last 3)  Recent Labs    05/30/22 0402 05/30/22 0619 05/30/22 0758  GLUCAP 108* 91 111*      Coagulation Profile: Recent Labs  Lab 05/29/22 1654  INR 1.0     Radiology Studies: I have personally reviewed  the imaging studies  CT ANGIO HEAD NECK W WO CM  Result Date: 05/29/2022 CLINICAL DATA:  Numbness in the left arm, left leg; right headache and jaw pain EXAM: CT ANGIOGRAPHY HEAD AND NECK TECHNIQUE: Multidetector CT imaging of the head and neck was performed using the standard protocol during bolus administration of intravenous contrast. Multiplanar  CT image reconstructions and MIPs were obtained to evaluate the vascular anatomy. Carotid stenosis measurements (when applicable) are obtained utilizing NASCET criteria, using the distal internal carotid diameter as the denominator. RADIATION DOSE REDUCTION: This exam was performed according to the departmental dose-optimization program which includes automated exposure control, adjustment of the mA and/or kV according to patient size and/or use of iterative reconstruction technique. CONTRAST:  45m OMNIPAQUE IOHEXOL 350 MG/ML SOLN COMPARISON:  03/17/2021 CTA head and neck, correlation is also made with 04/03/2022 CT head FINDINGS: CT HEAD FINDINGS Brain: No evidence of acute infarct, hemorrhage, mass, mass effect, or midline shift. No hydrocephalus or extra-axial fluid collection. Vascular: No hyperdense vessel. Skull: Negative for fracture or focal lesion. Sinuses/Orbits: No acute finding. Other: The mastoid air cells are well aerated. CTA NECK FINDINGS Aortic arch: Two-vessel arch with a common origin of the brachiocephalic and left common carotid arteries. Imaged portion shows no evidence of aneurysm or dissection. No significant stenosis of the major arch vessel origins. Right carotid system: No evidence of dissection, occlusion, or hemodynamically significant stenosis (greater than 50%). Left carotid system: No evidence of dissection, occlusion, or hemodynamically significant stenosis (greater than 50%). Vertebral arteries: No evidence of dissection, occlusion, or hemodynamically significant stenosis (greater than 50%). Skeleton: No acute osseous abnormality.  Degenerative changes in the cervical spine. Edentulous. Other neck: Negative. Upper chest: Negative. Review of the MIP images confirms the above findings CTA HEAD FINDINGS Anterior circulation: Both internal carotid arteries are patent to the termini, with mild stenosis in the bilateral cavernous and supraclinoid ICAs. Severe stenosis in the distal right ICA, just proximal to the terminus (series 11, image 244). A1 segments patent, with moderate stenosis in the proximal left A1. Normal anterior communicating artery. Anterior cerebral arteries are patent to their distal aspects. No M1 stenosis or occlusion. MCA branches perfused and symmetric. Posterior circulation: Vertebral arteries patent to the vertebrobasilar junction without significant stenosis posterior inferior cerebellar arteries patent proximally. Basilar patent to its distal aspect. Superior cerebellar arteries patent proximally. Patent P1 segments. PCAs perfused to their distal aspects without stenosis. The bilateral posterior communicating arteries are patent. Venous sinuses: As permitted by contrast timing, patent. Anatomic variants: None significant. Review of the MIP images confirms the above findings IMPRESSION: 1. No acute intracranial process. 2. Severe stenosis in the distal right ICA, just proximal to the terminus. Moderate stenosis in the proximal left A1 and mild stenosis in the bilateral cavernous and supraclinoid ICAs. 3. No hemodynamically significant stenosis in the neck. Electronically Signed   By: AMerilyn BabaM.D.   On: 05/29/2022 19:05       Riniyah Speich M.D. [facility] 05/30/2022, 9:58 AM  Available via Epic secure chat 7am-7pm After 7 pm, please refer to night coverage provider listed on amion.

## 2022-05-30 NOTE — Progress Notes (Signed)
Echocardiogram 2D Echocardiogram has been performed.  Karen Stein 05/30/2022, 2:32 PM

## 2022-05-30 NOTE — Evaluation (Signed)
Physical Therapy Evaluation Patient Details Name: Karen Stein MRN: [id number] DOB: [birth year] Today's Date: 05/30/2022  History of Present Illness  Pt is a 52 y/o F admitted on 05/29/22 after pt presented with c/o LLE weakness. CTA head and neck showed severe stenosis in distal right RCA, just proximal to the terminus, moderate stenosis in proximal left A1 and mild stenosis in the bilateral cavernous and supraclinoid ICAs.  CT head negative. Pt is being treated for TIA. Pt admitted for further work-up. PMH: DM, HLD, OSA not currently on CPAP, chronic back pain s/p neuro stimulator placement July 2022, anxiety/depression, DDD, fibromyalgia, scoliosis, sickle cell trait  Clinical Impression  Pt seen for PT evaluation with pt agreeable to tx. Pt reports prior to admission she was residing with her spouse in a 1 level home with 3 steps with R rail to enter, mod I with SPC, but notes 2 falls in past few months 2/2 other illnesses causing weakness.  On this date, pt requires more time/effort to complete LLE ROM but sufficient strength as no buckling noted in standing/gait. Pt is able to ambulate with supervision & SPC, at end of session requires extra time to elevate LLE onto bed. Recommend HHPT services upon d/c. Will continue to follow pt acutely to address LLE strengthening & NMR, activity tolerance, and stair negotiation.    Recommendations for follow up therapy are one component of a multi-disciplinary discharge planning process, led by the attending physician.  Recommendations may be updated based on patient status, additional functional criteria and insurance authorization.  Follow Up Recommendations Home health PT      Assistance Recommended at Discharge PRN  Patient can return home with the following  Assistance with cooking/housework;Help with stairs or ramp for entrance;Assist for transportation    Equipment Recommendations None recommended by PT  Recommendations for Other Services        Functional Status Assessment Patient has had a recent decline in their functional status and demonstrates the ability to make significant improvements in function in a reasonable and predictable amount of time.     Precautions / Restrictions Precautions Precautions: None Restrictions Weight Bearing Restrictions: No      Mobility  Bed Mobility Overal bed mobility: Modified Independent Bed Mobility: Supine to Sit, Sit to Supine     Supine to sit: HOB elevated, Modified independent (Device/Increase time) Sit to supine: Modified independent (Device/Increase time), HOB elevated        Transfers Overall transfer level: Needs assistance Equipment used: Straight cane Transfers: Sit to/from Stand Sit to Stand: Modified independent (Device/Increase time)                Ambulation/Gait Ambulation/Gait assistance: Supervision Gait Distance (Feet): 40 Feet Assistive device: Straight cane Gait Pattern/deviations: Step-through pattern, Decreased stride length Gait velocity: decreased     General Gait Details: No LOB noted  Stairs            Wheelchair Mobility    Modified Rankin (Stroke Patients Only)       Balance Overall balance assessment: Mild deficits observed, not formally tested   Sitting balance-Leahy Scale: Good     Standing balance support: Single extremity supported, During functional activity, No upper extremity supported Standing balance-Leahy Scale: Good                               Pertinent Vitals/Pain Pain Assessment Pain Assessment: Faces Faces Pain Scale: Hurts little more Pain Location:  middle & L side of neck (reports this is ongoing after falling & hitting head in Dec. 2023) Pain Descriptors / Indicators: Discomfort Pain Intervention(s): Monitored during session (MD notified)    Home Living Family/patient expects to be discharged to:: Private residence Living Arrangements: Spouse/significant other Available Help  at Discharge: Family;Available PRN/intermittently Type of Home: House Home Access: Stairs to enter Entrance Stairs-Rails: Right Entrance Stairs-Number of Steps: 3   Home Layout: One level Home Equipment: Cane - single point      Prior Function Prior Level of Function : Independent/Modified Independent             Mobility Comments: Pt reports 2 falls in the past ~2 months (notes she fell in December & hit her head, resulting in middle & L sided neck pain since -- MD notified). Pt reports she's mod I with SPC at home. Pt notes some days she has more difficulty than others mobilzing 2/2 hx of fibromyalgia.       Hand Dominance        Extremity/Trunk Assessment   Upper Extremity Assessment Upper Extremity Assessment: Overall WFL for tasks assessed    Lower Extremity Assessment Lower Extremity Assessment: LLE deficits/detail LLE Deficits / Details: 3/5 knee extension, hip flexion, & ankle dorsiflexion but extra time & effort required to complete ROM, no buckling noted in standing       Communication   Communication: No difficulties  Cognition Arousal/Alertness: Awake/alert Behavior During Therapy: WFL for tasks assessed/performed Overall Cognitive Status: Within Functional Limits for tasks assessed                                 General Comments: Pt tearful throughout session, notes feeling overwhelmed, but pleasant & conversational.        General Comments General comments (skin integrity, edema, etc.): PT educated pt on TIA, risks of stroke, encouraged her to take prescriptions as MD recommends.    Exercises     Assessment/Plan    PT Assessment Patient needs continued PT services  PT Problem List Decreased strength;Decreased mobility;Decreased range of motion       PT Treatment Interventions DME instruction;Therapeutic exercise;Gait training;Balance training;Stair training;Neuromuscular re-education;Functional mobility training;Cognitive  remediation;Therapeutic activities;Patient/family education    PT Goals (Current goals can be found in the Care Plan section)  Acute Rehab PT Goals Patient Stated Goal: get better PT Goal Formulation: With patient Time For Goal Achievement: 06/13/22 Potential to Achieve Goals: Good    Frequency Min 2X/week     Co-evaluation               AM-PAC PT "6 Clicks" Mobility  Outcome Measure Help needed turning from your back to your side while in a flat bed without using bedrails?: None Help needed moving from lying on your back to sitting on the side of a flat bed without using bedrails?: None Help needed moving to and from a bed to a chair (including a wheelchair)?: A Little Help needed standing up from a chair using your arms (e.g., wheelchair or bedside chair)?: None Help needed to walk in hospital room?: A Little Help needed climbing 3-5 steps with a railing? : A Little 6 Click Score: 21    End of Session   Activity Tolerance: Patient tolerated treatment well Patient left: in bed;with call bell/phone within reach Nurse Communication: Mobility status PT Visit Diagnosis: Muscle weakness (generalized) (M62.81);Other symptoms and signs involving the nervous system (R29.898)  Time: 3570-1779 PT Time Calculation (min) (ACUTE ONLY): 24 min   Charges:   PT Evaluation $PT Eval Moderate Complexity: [place], PT, DPT 05/30/22, 12:26 PM   Waunita Schooner 05/30/2022, 12:24 PM

## 2022-05-31 DIAGNOSIS — G459 Transient cerebral ischemic attack, unspecified: Secondary | ICD-10-CM | POA: Diagnosis not present

## 2022-05-31 LAB — CBC
HCT: 35.4 % — ABNORMAL LOW (ref 36.0–46.0)
Hemoglobin: 11.9 g/dL — ABNORMAL LOW (ref 12.0–15.0)
MCH: 29.3 pg (ref 26.0–34.0)
MCHC: 33.6 g/dL (ref 30.0–36.0)
MCV: 87.2 fL (ref 80.0–100.0)
Platelets: 304 10*3/uL (ref 150–400)
RBC: 4.06 MIL/uL (ref 3.87–5.11)
RDW: 13.2 % (ref 11.5–15.5)
WBC: 6.1 10*3/uL (ref 4.0–10.5)
nRBC: 0 % (ref 0.0–0.2)

## 2022-05-31 LAB — BASIC METABOLIC PANEL
Anion gap: 9 (ref 5–15)
BUN: 12 mg/dL (ref 6–20)
CO2: 21 mmol/L — ABNORMAL LOW (ref 22–32)
Calcium: 8 mg/dL — ABNORMAL LOW (ref 8.9–10.3)
Chloride: 105 mmol/L (ref 98–111)
Creatinine, Ser: 0.99 mg/dL (ref 0.44–1.00)
GFR, Estimated: >60 mL/min (ref >60)
Glucose, Bld: 426 mg/dL — ABNORMAL HIGH (ref 70–99)
Potassium: 4.7 mmol/L (ref 3.5–5.1)
Sodium: 135 mmol/L (ref 135–145)

## 2022-05-31 LAB — GLUCOSE, CAPILLARY
Glucose-Capillary: 438 mg/dL — ABNORMAL HIGH (ref 70–99)
Glucose-Capillary: 316 mg/dL — ABNORMAL HIGH (ref 70–99)
Glucose-Capillary: 243 mg/dL — ABNORMAL HIGH (ref 70–99)
Glucose-Capillary: 402 mg/dL — ABNORMAL HIGH (ref 70–99)

## 2022-05-31 MED ORDER — LORAZEPAM 2 MG/ML IJ SOLN
1.0000 mg | Freq: Once | INTRAMUSCULAR | Status: DC
  Filled 2022-05-31: qty 1

## 2022-05-31 MED ORDER — DIPHENHYDRAMINE HCL 25 MG PO CAPS
25.0000 mg | ORAL_CAPSULE | Freq: Four times a day (QID) | ORAL | Status: DC | PRN
  Administered 2022-05-31: 25 mg via ORAL
  Filled 2022-05-31 (×3): qty 1

## 2022-05-31 MED ORDER — INSULIN GLARGINE-YFGN 100 UNIT/ML ~~LOC~~ SOLN
15.0000 [IU] | Freq: Two times a day (BID) | SUBCUTANEOUS | Status: DC
  Administered 2022-05-31 – 2022-06-01 (×2): 15 [IU] via SUBCUTANEOUS
  Filled 2022-05-31 (×3): qty 0.15

## 2022-05-31 MED ORDER — INSULIN ASPART 100 UNIT/ML IJ SOLN
15.0000 [IU] | Freq: Once | INTRAMUSCULAR | Status: AC
  Administered 2022-05-31: 15 [IU] via SUBCUTANEOUS

## 2022-05-31 MED ORDER — SENNA 8.6 MG PO TABS
1.0000 | ORAL_TABLET | Freq: Every day | ORAL | Status: DC
  Administered 2022-05-31: 8.6 mg via ORAL
  Filled 2022-05-31: qty 1

## 2022-05-31 MED ORDER — INSULIN ASPART 100 UNIT/ML IJ SOLN
3.0000 [IU] | Freq: Three times a day (TID) | INTRAMUSCULAR | Status: DC
  Administered 2022-05-31 (×2): 3 [IU] via SUBCUTANEOUS

## 2022-05-31 MED ORDER — MELATONIN 3 MG PO TABS
3.0000 mg | ORAL_TABLET | Freq: Every day | ORAL | Status: DC
  Administered 2022-05-31: 3 mg via ORAL
  Filled 2022-05-31: qty 1

## 2022-05-31 MED ORDER — INSULIN GLARGINE-YFGN 100 UNIT/ML ~~LOC~~ SOLN
12.0000 [IU] | Freq: Every day | SUBCUTANEOUS | Status: DC
  Administered 2022-05-31: 12 [IU] via SUBCUTANEOUS
  Filled 2022-05-31: qty 0.12

## 2022-05-31 MED ORDER — LIVING WELL WITH DIABETES BOOK
Freq: Once | Status: AC
  Filled 2022-05-31: qty 1

## 2022-05-31 NOTE — Progress Notes (Addendum)
STROKE TEAM PROGRESS NOTE   INTERVAL HISTORY No family at the bedside. Patient is sitting up in the be in NAD Patient states her symptoms are better but not completely resolved.  MRI brain and cervical spine ordered. She does have a spinal stimulator that is MRI compatible.  No new neurological symptoms. Exam stable   Vitals:   05/30/22 2004 05/30/22 2305 05/31/22 0319 05/31/22 0810  BP: (!) 163/76 133/69 135/73 133/70  Pulse:  78 81   Resp: '19 18 13   '$ Temp: 98.5 F (36.9 C) 98.4 F (36.9 C) 98.3 F (36.8 C) 97.9 F (36.6 C)  TempSrc:  Oral Oral Oral  SpO2: 100% 96% 98%   Weight:      Height:       CBC:  Recent Labs  Lab 05/29/22 1654 05/29/22 1741 05/30/22 0356 05/31/22 0716  WBC 9.8   < > 8.7 6.1  NEUTROABS 5.9  --   --   --   HGB 13.2   < > 12.8 11.9*  HCT 40.0   < > 39.4 35.4*  MCV 88.9   < > 87.9 87.2  PLT 359   < > 334 304   < > = values in this interval not displayed.   Basic Metabolic Panel:  Recent Labs  Lab 05/30/22 0356 05/31/22 0716  NA 138 135  K 4.3 4.7  CL 105 105  CO2 26 21*  GLUCOSE 111* 426*  BUN 11 12  CREATININE 0.87 0.99  CALCIUM 8.4* 8.0*   Lipid Panel:  Recent Labs  Lab 05/30/22 0356  CHOL 202*  TRIG 81  HDL 31*  CHOLHDL 6.5  VLDL 16  LDLCALC 155*   HgbA1c:  Recent Labs  Lab 05/29/22 2322  HGBA1C 10.0*   Urine Drug Screen:  Recent Labs  Lab 05/29/22 1654  LABOPIA NONE DETECTED  COCAINSCRNUR NONE DETECTED  LABBENZ NONE DETECTED  AMPHETMU NONE DETECTED  THCU POSITIVE*  LABBARB NONE DETECTED    Alcohol Level  Recent Labs  Lab 05/29/22 1705  ETH <10    IMAGING past 24 hours ECHOCARDIOGRAM COMPLETE  Result Date: 05/30/2022    ECHOCARDIOGRAM REPORT   Patient Name:   Karen Stein Date of Exam: 05/30/2022 Medical Rec #:  [id number]       Height:       68.0 in Accession #:    [id number]      Weight:       239.0 lb Date of Birth:  [birth year]      BSA:          2.204 m Patient Age:    78 years        BP:            139/73 mmHg Patient Gender: F               HR:           78 bpm. Exam Location:  Inpatient Procedure: 2D Echo, Cardiac Doppler and Color Doppler Indications:    TIA  History:        Patient has prior history of Echocardiogram examinations. Risk                 Factors:Diabetes and Dyslipidemia.  Sonographer:    Phineas Douglas Referring Phys: [phone] DEBBY CROSLEY IMPRESSIONS  1. Left ventricular ejection fraction, by estimation, is 60 to 65%. The left ventricle has normal function. The left ventricle has no regional wall motion abnormalities. There is mild asymmetric  left ventricular hypertrophy of the basal-septal segment. Left ventricular diastolic parameters were normal.  2. Right ventricular systolic function is normal. The right ventricular size is normal. Tricuspid regurgitation signal is inadequate for assessing PA pressure.  3. The mitral valve is grossly normal. No evidence of mitral valve regurgitation. No evidence of mitral stenosis.  4. The aortic valve is tricuspid. Aortic valve regurgitation is not visualized. No aortic stenosis is present. Conclusion(s)/Recommendation(s): No intracardiac source of embolism detected on this transthoracic study. Consider a transesophageal echocardiogram to exclude cardiac source of embolism if clinically indicated. FINDINGS  Left Ventricle: Left ventricular ejection fraction, by estimation, is 60 to 65%. The left ventricle has normal function. The left ventricle has no regional wall motion abnormalities. The left ventricular internal cavity size was normal in size. There is  mild asymmetric left ventricular hypertrophy of the basal-septal segment. Left ventricular diastolic parameters were normal. Right Ventricle: The right ventricular size is normal. No increase in right ventricular wall thickness. Right ventricular systolic function is normal. Tricuspid regurgitation signal is inadequate for assessing PA pressure. Left Atrium: Left atrial size was normal in size. Right  Atrium: Right atrial size was normal in size. Pericardium: Trivial pericardial effusion is present. Mitral Valve: The mitral valve is grossly normal. No evidence of mitral valve regurgitation. No evidence of mitral valve stenosis. Tricuspid Valve: The tricuspid valve is grossly normal. Tricuspid valve regurgitation is not demonstrated. No evidence of tricuspid stenosis. Aortic Valve: The aortic valve is tricuspid. Aortic valve regurgitation is not visualized. No aortic stenosis is present. Pulmonic Valve: The pulmonic valve was grossly normal. Pulmonic valve regurgitation is not visualized. No evidence of pulmonic stenosis. Aorta: The aortic root and ascending aorta are structurally normal, with no evidence of dilitation. Venous: The inferior vena cava was not well visualized. IAS/Shunts: The atrial septum is grossly normal.  LEFT VENTRICLE PLAX 2D LVIDd:         4.30 cm      Diastology LVIDs:         2.50 cm      LV e' medial:    8.16 cm/s LV PW:         1.10 cm      LV E/e' medial:  13.0 LV IVS:        1.30 cm      LV e' lateral:   10.70 cm/s LVOT diam:     1.90 cm      LV E/e' lateral: 9.9 LV SV:         64 LV SV Index:   29 LVOT Area:     2.84 cm  LV Volumes (MOD) LV vol d, MOD A2C: 97.5 ml LV vol d, MOD A4C: 101.0 ml LV vol s, MOD A2C: 31.8 ml LV vol s, MOD A4C: 36.6 ml LV SV MOD A2C:     65.7 ml LV SV MOD A4C:     101.0 ml LV SV MOD BP:      65.0 ml RIGHT VENTRICLE RV Basal diam:  3.50 cm RV S prime:     18.10 cm/s TAPSE (M-mode): 2.2 cm LEFT ATRIUM           Index LA diam:      3.30 cm 1.50 cm/m LA Vol (A2C): 41.9 ml 19.01 ml/m LA Vol (A4C): 34.9 ml 15.83 ml/m  AORTIC VALVE LVOT Vmax:   105.00 cm/s LVOT Vmean:  68.700 cm/s LVOT VTI:    0.226 m  AORTA Ao Root diam: 2.80 cm Ao  Asc diam:  2.80 cm MITRAL VALVE MV Area (PHT): 4.10 cm     SHUNTS MV Decel Time: 185 msec     Systemic VTI:  0.23 m MV E velocity: 106.00 cm/s  Systemic Diam: 1.90 cm MV A velocity: 96.70 cm/s MV E/A ratio:  1.10 Karen Chiquito MD  Electronically signed by Karen Chiquito MD Signature Date/Time: 05/30/2022/3:36:34 PM    Final     PHYSICAL EXAM  Temp:  [97.9 F (36.6 C)-98.7 F (37.1 C)] 97.9 F (36.6 C) (01/31 0810) Pulse Rate:  [75-85] 81 (01/31 0319) Resp:  [13-19] 13 (01/31 0319) BP: (133-170)/(69-86) 133/70 (01/31 0810) SpO2:  [96 %-100 %] 98 % (01/31 0319)  General -obese middle-aged African-American lady, in no apparent distress. Cardiovascular - Regular rhythm and rate.  Mental Status -  Level of arousal and orientation to time, place, and person were intact. Language including expression, naming, repetition, comprehension was assessed and found intact. Attention span and concentration were normal. Recent and remote memory were intact. Fund of Knowledge was assessed and was intact.  Cranial Nerves II - XII - II - Visual field intact OU. III, IV, VI - Extraocular movements intact. V - Facial sensation intact bilaterally. VII - Facial movement intact bilaterally. VIII - Hearing & vestibular intact bilaterally. X - Palate elevates symmetrically. XI - Chin turning & shoulder shrug intact bilaterally. XII - Tongue protrusion intact.  Motor Strength - The patient's strength was normal in all extremities and pronator drift was absent.  Bulk was normal and fasciculations were absent.  Left dorsiflexion/flexion is weak Motor Tone - Muscle tone was assessed at the neck and appendages and was normal.  Sensory - Light touch, temperature/pinprick were assessed and were symmetrical.    Coordination - The patient had normal movements in the hands and feet with no ataxia or dysmetria.  Tremor was absent.  Gait and Station - deferred.  ASSESSMENT/PLAN Karen Stein is a 52 y.o. female with history of diabetes, hyperlipidemia, obstructive sleep apnea not currently on CPAP, BMI 36, chronic back pain s/p neurostimulator placement July 2022, anxiety/depression   Left leg weakness numbness, pain and cramps  likely L5-S1 lumbar radiculopathy..  Doubt TIA  CT head 1. No acute intracranial abnormality. No skull fracture. 2. 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 CTA head & neck  1. No acute intracranial process. 2. Severe stenosis in the distal right ICA, just proximal to the terminus. Moderate stenosis in the proximal left A1 and mild stenosis in the bilateral cavernous and supraclinoid ICAs. 3. No hemodynamically significant stenosis in the neck. MRI  brain ordered MRI cervical Spine ordered 2D Echo EF 60-65%. There is mild asymmetric left ventricular  hypertrophy of the basal-septal segment  LDL 155 HgbA1c 10.0 VTE prophylaxis - heparin SQ    Diet   Diet heart healthy/carb modified Room service appropriate? Yes; Fluid consistency: Thin   No antithrombotic prior to admission, now on aspirin 81 mg daily and clopidogrel 75 mg daily. For 3 weeks than ASA alone  Therapy recommendations:  pending Disposition:  pending  Hypertension Home meds:  none Stable Permissive hypertension (OK if < 220/120) but gradually normalize in 5-7 days Long-term BP goal normotensive  Hyperlipidemia Home meds:  lipitor '20mg'$ , resumed in hospital (increased to '80mg'$ )  LDL 155, goal < 70 Continue statin at discharge  Diabetes type II UnControlled Home meds:  insulin HgbA1c 10.0, goal < 7.0 CBGs Recent Labs    05/30/22 1759 05/30/22 2002 05/31/22  0813  GLUCAP 213* 253* 438*    SSI  Other Stroke Risk Factors Substance abuse - UDS:  THC POSITIVE, Cocaine NONE DETECTED. Patient advised to stop using due to stroke risk. Obesity, Body mass index is 36.34 kg/m., BMI >/= 30 associated with increased stroke risk, recommend weight loss, diet and exercise as appropriate  Hospital day # 2  Karen Gandy DNP, ACNPC-AG  [facility] Pager# [phone]  I have personally obtained history,examined this patient, reviewed notes, independently viewed imaging studies, participated in  medical decision making and plan of care.ROS completed by me personally and pertinent positives fully documented  I have made any additions or clarifications directly to the above note. Agree with note above.  Patient presented with left leg cramps followed by some backache and left leg weakness and numbness likely from lumbar radiculopathy given her prior history of degenerative spine disease.  Doubt TIA in the absence of any symptoms affecting the face or significant arm weakness.  Recommend check MRI scan of the spine.  She has mild cord stimulator but it is MRI compatible but will need coordination to get this done.  Discussed with patient and Dr.Dahal.  Greater than 50% time during this 50-minute visit was spent in counseling and coordination of care and discussion patient and care team and answering questions.  Antony Contras, MD Medical Director [facility] Pager: [phone] 05/31/2022 2:01 PM  To contact Stroke Continuity provider, please refer to [email or web address] After hours, contact General Neurology

## 2022-05-31 NOTE — Evaluation (Signed)
Occupational Therapy Evaluation and Discharge Patient Details Name: Karen Stein MRN: [id number] DOB: 16-Jul-1970 Today's Date: 05/31/2022   History of Present Illness Pt is a 52 y/o F admitted on 05/29/22 after pt presented with c/o LLE weakness. CTA head and neck showed severe stenosis in distal right RCA, just proximal to the terminus, moderate stenosis in proximal left A1 and mild stenosis in the bilateral cavernous and supraclinoid ICAs.  CT head negative. Pt is being treated for TIA. Pt admitted for further work-up. PMH: DM, HLD, OSA not currently on CPAP, chronic back pain s/p neuro stimulator placement July 2022, anxiety/depression, DDD, fibromyalgia, scoliosis, sickle cell trait   Clinical Impression   Pt is typically mod I with SPC for mobility and independent in ADL/IADL. She reports 2 falls since December, and she makes smart choices for clothing to facilitate dressing like slip on shoes. Today she is mod I for all aspects of ADL. She reports to OT that she showered herself and has been ambulating to bathroom as needed without RN assist. She was able to perform LB dressing UB dressing, self-feeding and grooming. Pt's LLE is weaker than R as demonstrated when she lifts it off the bed (MMT 4/5) MRI still pending, MD came in during session and Pt was able to answer higher level cognition questions about medication management. OT education complete, and Pt with no questions or concerns for OT at this time. OT will sign off - should other concerns arise, please feel free to re-order.      Recommendations for follow up therapy are one component of a multi-disciplinary discharge planning process, led by the attending physician.  Recommendations may be updated based on patient status, additional functional criteria and insurance authorization.   Follow Up Recommendations  No OT follow up     Assistance Recommended at Discharge PRN  Patient can return home with the following Assistance with  cooking/housework    Functional Status Assessment  Patient has had a recent decline in their functional status and demonstrates the ability to make significant improvements in function in a reasonable and predictable amount of time.  Equipment Recommendations  Tub/shower seat    Recommendations for Other Services       Precautions / Restrictions Precautions Precautions: None Restrictions Weight Bearing Restrictions: No      Mobility Bed Mobility Overal bed mobility: Modified Independent Bed Mobility: Supine to Sit, Sit to Supine     Supine to sit: HOB elevated, Modified independent (Device/Increase time) Sit to supine: Modified independent (Device/Increase time), HOB elevated        Transfers Overall transfer level: Needs assistance Equipment used: Straight cane Transfers: Sit to/from Stand Sit to Stand: Supervision                  Balance Overall balance assessment: Mild deficits observed, not formally tested   Sitting balance-Leahy Scale: Good     Standing balance support: Single extremity supported, During functional activity, No upper extremity supported Standing balance-Leahy Scale: Good                             ADL either performed or assessed with clinical judgement   ADL Overall ADL's : Modified independent                                       General ADL Comments:  PT able to demonstrate UB and LB ADL, grooming, self feeding, showered herself and has been going to the bathroom independently, Pt does not have any concerns about ADL at this time     Vision Baseline Vision/History: 1 Wears glasses Ability to See in Adequate Light: 0 Adequate Patient Visual Report: No change from baseline Vision Assessment?: No apparent visual deficits     Perception     Praxis      Pertinent Vitals/Pain Pain Assessment Pain Assessment: Faces Faces Pain Scale: Hurts little more Pain Location: middle & L side of neck  (reports this is ongoing after falling & hitting head in Dec. 2023) Pain Descriptors / Indicators: Discomfort Pain Intervention(s): Monitored during session     Hand Dominance     Extremity/Trunk Assessment Upper Extremity Assessment Upper Extremity Assessment: Overall WFL for tasks assessed   Lower Extremity Assessment Lower Extremity Assessment: Defer to PT evaluation   Cervical / Trunk Assessment Cervical / Trunk Assessment: Other exceptions (history of back sx/stimulator implant)   Communication Communication Communication: No difficulties   Cognition Arousal/Alertness: Awake/alert Behavior During Therapy: WFL for tasks assessed/performed Overall Cognitive Status: Within Functional Limits for tasks assessed                                       General Comments       Exercises     Shoulder Instructions      Home Living Family/patient expects to be discharged to:: Private residence Living Arrangements: Spouse/significant other Available Help at Discharge: Family;Available PRN/intermittently Type of Home: House Home Access: Stairs to enter [employer] of Steps: 3 Entrance Stairs-Rails: Right Home Layout: One level     Bathroom Shower/Tub: Teacher, early years/pre: Handicapped height     Home Equipment: Cane - single point          Prior Functioning/Environment Prior Level of Function : Independent/Modified Independent             Mobility Comments: Pt reports 2 falls in the past ~2 months (notes she fell in December & hit her head, resulting in middle & L sided neck pain since -- MD notified). Pt reports she's mod I with SPC at home. Pt notes some days she has more difficulty than others mobilzing 2/2 hx of fibromyalgia.          OT Problem List: Impaired balance (sitting and/or standing)      OT Treatment/Interventions:      OT Goals(Current goals can be found in the care plan section) Acute Rehab OT  Goals Patient Stated Goal: feel better OT Goal Formulation: With patient  OT Frequency:      Co-evaluation              AM-PAC OT "6 Clicks" Daily Activity     Outcome Measure Help from another person eating meals?: None Help from another person taking care of personal grooming?: None Help from another person toileting, which includes using toliet, bedpan, or urinal?: None Help from another person bathing (including washing, rinsing, drying)?: None Help from another person to put on and taking off regular upper body clothing?: None Help from another person to put on and taking off regular lower body clothing?: None 6 Click Score: 24   End of Session Equipment Utilized During Treatment: Gait belt [facility]) Nurse Communication: Mobility status  Activity Tolerance: Patient tolerated treatment well Patient left: in  chair;with call bell/phone within reach  OT Visit Diagnosis: Other abnormalities of gait and mobility (R26.89);Muscle weakness (generalized) (M62.81)                Time: 9923-4144 OT Time Calculation (min): 14 min Charges:  OT General Charges $OT Visit: 1 Visit OT Evaluation $OT Eval Low Complexity: [place] OTR/L Acute Rehabilitation Services Office: [place] 05/31/2022, 12:48 PM

## 2022-05-31 NOTE — Discharge Instructions (Signed)
Food pantry and assistance -[employer]: [address], [place] [address]. Phone [phone]  -[facility] [employer] [facility]: [address] [place], [place] [address]. [phone].  -[place]: [email or web address]://[email or web address].[email or web address]  -Apply for Food Stamps with Dept. Of Social Services: Phone: [phone]

## 2022-05-31 NOTE — Progress Notes (Signed)
SLP Cancellation Note  Patient Details Name: Karen Stein MRN: [id number] DOB: June 04, 1970   Cancelled treatment:       Reason Eval/Treat Not Completed: SLP screened, no needs identified, will sign off. CT head negative for acute intracranial process. SLP briefly spoke with patient who denies any new speech,language or cognitive impairments. She did report having memory impairment prior to this hospitalization and is followed by [facility] Neurology. (Per chart review, she was last seen for this 04/07/22). SLP to s/o at this time.  Sonia Baller, MA, CCC-SLP Speech Therapy

## 2022-05-31 NOTE — Progress Notes (Signed)
PROGRESS NOTE  Karen Stein  DOB: Dec 24, 1970  PCP: [name], MD VOH:[id number]  DOA: 05/29/2022  LOS: 2 days  Hospital Day: 3  Brief narrative: Karen Stein is a 52 y.o. female with PMH significant for DM2, HLD, obesity, OSA, chronic back pain status post neurostimulator in July 2022, peripheral neuropathy, gastroparesis, anxiety/depression 1/29, patient presented to the ED with complaint of lower extremity weakness. Patient reported that the previous day she woke up with a bad headache and after a few hours she developed numbness in her left leg which persisted into the next day.  Next day, she also felt wobbly leg and was unable to stand and hence presented to the ED.  In the ED, CT head unremarkable CTA head and neck showed severe stenosis in distal right RCA, just proximal to the terminus, moderate stenosis in proximal left A1 and mild stenosis in the bilateral cavernous and supraclinoid ICAs.   Seen by neurology Admitted to [facility] for stroke workup Pending MRI brain and MRI cervical spine.  Subjective: Patient was seen and examined this morning.  Pleasant middle-aged African-American female.  Propped up in bed.  Continues to feel weak on the left side but was able to walk on the hallway today. In the last 24 hours, hemodynamically stable with blood pressure in 130s this morning Labs with blood glucose over 400 this morning  Assessment and plan: TIA (transient ischemic attack) with left-sided numbness and L LE weakness Presented with neurological symptoms as above.   CT head and CT a head as above.   Per neurology patient had possible stuttering right-sided lacunar infarct versus right ICA stenosis,  Moderate stenosis in the proximal left A1 MR stenosis and bilateral cavernous and supraclinoid ICA Pending MRI brain and MRI cervical spine. Admitted for stroke workup Echocardiogram unremarkable for any cardiac source of embolism. A1c uncontrolled at 10, LDL  155 Pending PT, OT eval PTA, Currently on DAPT with aspirin 81 mg daily and Plavix 75 mg daily.  Uncontrolled type 2 diabetes mellitus with hyperglycemia A1c 10 on 05/29/2022 PTA on Ozempic weekly and sliding scale insulin. Currently on Semglee 7 units nightly with sliding scale insulin.  Blood sugar this morning is elevated over 400. I revised Semglee to 12 units to be given this morning.  Added Premeal insulin scheduled 3 units 3 times daily and sliding scale insulin.  Continue Accu-Cheks Diabetes care coordinator consulted Recent Labs  Lab 05/30/22 1159 05/30/22 1759 05/30/22 2002 05/31/22 0813 05/31/22 1304  GLUCAP 161* 213* 253* 438* 316*   Mixed hyperlipidemia LDL 155, HDL 31, cholesterol 202 Started on Lipitor 80 mg daily.   Carotid artery stenosis PTA noted severe stenosis of the distal right RCA  continue aspirin, Plavix, statin   History of gastroparesis Continue Reglan   Diabetic neuropathy Continue Lyrica, Robaxin, oxycodone   Obesity  Body mass index is 36.34 kg/m. Patient has been advised to make an attempt to improve diet and exercise patterns to aid in weight loss.   Mobility: PT eval obtained.  Home with PT recommended  Goals of care   Code Status: Full Code     DVT prophylaxis:  heparin injection 5,000 Units Start: 05/29/22 2230 SCD's Start: 05/29/22 2224   Antimicrobials: None Fluid: Stop IV fluid Consultants: Neurology Family Communication: None at bedside  Status is: Inpatient Level of care: Telemetry Medical   Dispo: Patient is from: Home              Anticipated d/c is to:  Pending clinical course.  Hopefully home with home with PT Continue in-hospital care because: Ongoing stroke workup   Scheduled Meds:   stroke: early stages of recovery book   Does not apply Once   aspirin EC  81 mg Oral Daily   atorvastatin  80 mg Oral QHS   clopidogrel  75 mg Oral Daily   diclofenac Sodium  4 g Topical QID   heparin  5,000 Units  Subcutaneous Q8H   insulin aspart  0-5 Units Subcutaneous QHS   insulin aspart  0-9 Units Subcutaneous TID WC   insulin aspart  3 Units Subcutaneous TID WC   insulin glargine-yfgn  12 Units Subcutaneous Daily   melatonin  3 mg Oral QHS   oxyCODONE  10 mg Oral Q6H   pregabalin  200 mg Oral TID   senna  1 tablet Oral QHS    PRN meds: acetaminophen **OR** acetaminophen (TYLENOL) oral liquid 160 mg/5 mL **OR** acetaminophen, diphenhydrAMINE, lidocaine, methocarbamol, metoCLOPramide, senna-docusate   Infusions:     Diet:  Diet Order             Diet heart healthy/carb modified Room service appropriate? Yes; Fluid consistency: Thin  Diet effective now                   Antimicrobials: Anti-infectives (From admission, onward)    None       Skin assessment:       Nutritional status:  Body mass index is 36.34 kg/m.          Objective: Vitals:   05/31/22 0319 05/31/22 0810  BP: 135/73 133/70  Pulse: 81   Resp: 13   Temp: 98.3 F (36.8 C) 97.9 F (36.6 C)  SpO2: 98%    No intake or output data in the 24 hours ending 05/31/22 1312 Filed Weights   05/29/22 1851  Weight: 108.4 kg   Weight change:  Body mass index is 36.34 kg/m.   Physical Exam: General exam: Pleasant middle-aged African-American female.  Not in distress Skin: No rashes, lesions or ulcers. HEENT: Atraumatic, normocephalic, no obvious bleeding Lungs: Clear to auscultation bilaterally CVS: Regular rate and rhythm, no murmur GI/Abd soft, nontender, nondistended, bowel sound present CNS: Alert, awake, oriented x 3 Psychiatry: Mood appropriate Extremities: No pedal edema, no calf tenderness  Data Review: I have personally reviewed the laboratory data and studies available.  F/u labs ordered Unresulted Labs (From admission, onward)    None       Total time spent in review of labs and imaging, patient evaluation, formulation of plan, documentation and communication with family:  45 minutes  Signed, Terrilee Croak, MD [facility] 05/31/2022

## 2022-05-31 NOTE — Plan of Care (Signed)
  Problem: Education: Goal: Knowledge of disease or condition will improve Outcome: Progressing Goal: Knowledge of secondary prevention will improve (MUST DOCUMENT ALL) Outcome: Progressing Goal: Knowledge of patient specific risk factors will improve Karen Stein N/A or DELETE if not current risk factor) Outcome: Progressing   Problem: Ischemic Stroke/TIA Tissue Perfusion: Goal: Complications of ischemic stroke/TIA will be minimized Outcome: Progressing   Problem: Coping: Goal: Will verbalize positive feelings about self Outcome: Progressing Goal: Will identify appropriate support needs Outcome: Progressing   Problem: Health Behavior/Discharge Planning: Goal: Ability to manage health-related needs will improve Outcome: Progressing Goal: Goals will be collaboratively established with patient/family Outcome: Progressing   Problem: Self-Care: Goal: Ability to participate in self-care as condition permits will improve Outcome: Progressing Goal: Verbalization of feelings and concerns over difficulty with self-care will improve Outcome: Progressing Goal: Ability to communicate needs accurately will improve Outcome: Progressing   Problem: Nutrition: Goal: Risk of aspiration will decrease Outcome: Progressing Goal: Dietary intake will improve Outcome: Progressing   Problem: Education: Goal: Ability to describe self-care measures that may prevent or decrease complications (Diabetes Survival Skills Education) will improve Outcome: Progressing Goal: Individualized Educational Video(s) Outcome: Progressing   Problem: Coping: Goal: Ability to adjust to condition or change in health will improve Outcome: Progressing   Problem: Fluid Volume: Goal: Ability to maintain a balanced intake and output will improve Outcome: Progressing   Problem: Health Behavior/Discharge Planning: Goal: Ability to identify and utilize available resources and services will improve Outcome:  Progressing Goal: Ability to manage health-related needs will improve Outcome: Progressing   Problem: Metabolic: Goal: Ability to maintain appropriate glucose levels will improve Outcome: Progressing   Problem: Nutritional: Goal: Maintenance of adequate nutrition will improve Outcome: Progressing Goal: Progress toward achieving an optimal weight will improve Outcome: Progressing   Problem: Skin Integrity: Goal: Risk for impaired skin integrity will decrease Outcome: Progressing   Problem: Tissue Perfusion: Goal: Adequacy of tissue perfusion will improve Outcome: Progressing   Problem: Education: Goal: Knowledge of General Education information will improve Description: Including pain rating scale, medication(s)/side effects and non-pharmacologic comfort measures Outcome: Progressing   Problem: Health Behavior/Discharge Planning: Goal: Ability to manage health-related needs will improve Outcome: Progressing   Problem: Clinical Measurements: Goal: Ability to maintain clinical measurements within normal limits will improve Outcome: Progressing Goal: Will remain free from infection Outcome: Progressing Goal: Diagnostic test results will improve Outcome: Progressing Goal: Respiratory complications will improve Outcome: Progressing Goal: Cardiovascular complication will be avoided Outcome: Progressing   Problem: Activity: Goal: Risk for activity intolerance will decrease Outcome: Progressing   Problem: Nutrition: Goal: Adequate nutrition will be maintained Outcome: Progressing   Problem: Coping: Goal: Level of anxiety will decrease Outcome: Progressing   Problem: Elimination: Goal: Will not experience complications related to bowel motility Outcome: Progressing Goal: Will not experience complications related to urinary retention Outcome: Progressing   Problem: Pain Managment: Goal: General experience of comfort will improve Outcome: Progressing   Problem:  Safety: Goal: Ability to remain free from injury will improve Outcome: Progressing   Problem: Skin Integrity: Goal: Risk for impaired skin integrity will decrease Outcome: Progressing

## 2022-05-31 NOTE — Progress Notes (Signed)
Mobility Specialist Progress Note:    05/31/22 0936  Mobility  Activity Ambulated with assistance in hallway  Level of Assistance Standby assist, set-up cues, supervision of patient - no hands on  Assistive Device Cane  Distance Ambulated (ft) 400 ft  Activity Response Tolerated well  Mobility Referral Yes  $Mobility charge 1 Mobility   Pt was agreeable for mobility session. Tolerated well, SB for safety. Returned pt to room with all needs met.  Royetta Crochet Mobility Specialist Please contact via Solicitor or  Rehab office at [phone]

## 2022-05-31 NOTE — Inpatient Diabetes Management (Signed)
Inpatient Diabetes Program Recommendations  AACE/ADA: New Consensus Statement on Inpatient Glycemic Control (2015)  Target Ranges:  Prepandial:   less than 140 mg/dL      Peak postprandial:   less than 180 mg/dL (1-2 hours)      Critically ill patients:  140 - 180 mg/dL   Lab Results  Component Value Date   GLUCAP 316 (H) 05/31/2022   HGBA1C 10.0 (H) 05/29/2022    Review of Glycemic Control  Latest Reference Range & Units 05/30/22 07:58 05/30/22 11:59 05/30/22 17:59 05/30/22 20:02 05/31/22 08:13 05/31/22 13:04  Glucose-Capillary 70 - 99 mg/dL 111 (H) 161 (H) 213 (H) 253 (H) 438 (H) 316 (H)  (H): Data is abnormally high  Diabetes history: DM2  Outpatient Diabetes medications:  Ozempic weekly Omnipod insulin pump using Fiasp Basal 12a-12a-1.4 units/hr Total basal=33.6 units daily Insulin carb ratio 12a-6:30p-1 unit for every 9 carbs 6:30p-12a-1unit for every 8 carbs Insulin sensitivity factor-1 units drops her 34 mg/dL Target BG-120 mg/dl Correction starts at 150 mg/dl  Current orders for Inpatient glycemic control:  Semglee 12 unit QD  Novolog 0-9 units TID and 0-5 units QHS Novolog 3 units with meals   Inpatient Diabetes Program Recommendations:    Please consider: Semglee 12 units BID Novolog 0-15 units TID  Spoke with patient at bedside.  She states she has had diabetes since she was a child.  She confirms above home medications.  She allowed me to view her insulin pump settings.  She is current with endocrinologist, Dr. Lalla Brothers with [facility].    She states she struggles sometimes to afford her insulin and Ozempic.  She has insurance.  Asked to to reach out to endo when she is unable to obtain insulins; they could help her get set up with medication assistance or provide samples.    Reviewed patient's current A1c of 10% (average blood glucose of 240 mg/dL). Explained what a A1c is and what it measures. Also reviewed goal A1c with patient, importance of good  glucose control @ home, and blood sugar goals.  She had her teeth several months ago due to them deteriorating due to Gabapentin.  She is only able to eat soft foods.  She states this makes it difficult for her to avoid starchy vegetables.   She usually eats mashed potatoes and well done broccoli.  Her PCP told her to eat sweet potatoes instead of regular mashed potatoes.  She drinks regular soda and says the diet soda is worse.   Living Well with Diabetes booklet is in her lap.  She states she is going to read it.  Pointed out page 27 which refers to diet and portion control.    She states she knows what to do but it is hard sometimes.  Encouraged her to do her best and explained importance of good glucose control.  Will continue to follow while inpatient.  Thank you, Reche Dixon, MSN, [place] Diabetes Coordinator Inpatient Diabetes Program [phone] (team pager from 8a-5p)

## 2022-05-31 NOTE — TOC Initial Note (Addendum)
Transition of Care [facility]) - Initial/Assessment Note    Patient Details  Name: Karen Stein MRN: [id number] Date of Birth: 17-Jun-1970  [facility] CM/SW Contact:    Levonne Lapping, RN Phone Number: 05/31/2022, 2:20 PM  Clinical Narrative:     UPDATE 3:49 PM [facility] has accepted for [facility] PT and an Aide   CM met with Patient bedside to discuss DC plans. Patient is being recommended HHPT and an Aide. Choice list given but referral is pending acceptance of her insurance [employer]. So far declinations received from the following companies:  Radene Knee [facility]  [facility] - Declined due to staffing only   Pending replies from others . CM will continue to search for accepting [facility] provider.   3 In 1 BSC ordered from [facility]- No preference    Patient states she lives "with someone but they're not really there" Stated her Daughter will most likely be her support and provide transportation home.   SDOH reviewed based on comments of issues with medication affordability and food insecurities  Resources given   TOC will continue to follow patient for any additional discharge needs    Expected Discharge Plan: (P) [place] Barriers to Discharge: (P) Continued Medical Work up   Patient Goals and CMS Choice Patient states their goals for this hospitalization and ongoing recovery are:: (P) go home CMS Medicare.gov Compare Post Acute Care list provided to:: (P) Patient Choice offered to / list presented to : (P) Patient      Expected Discharge Plan and Services   Discharge Planning Services: (P) CM Consult Post Acute Care Choice: (P) Durable Medical Equipment, [place] arrangements for the past 2 months: (P) [place] (Lives with "someone" but not much help)                 DME Arranged: (P) Shower [facility] DME Agency: (P) [facility] Date DME Agency Contacted: (P) 05/31/22 Time DME Agency Contacted:  (P) [id number] Representative spoke with at DME Agency: (P) Jermaine            Prior Living Arrangements/Services Living arrangements for the past 2 months: (P) [place] (Lives with "someone" but not much help) Lives with:: (P) Other (Comment) (Wouldn't tell me who but stated "theyre not much help) Patient language and need for interpreter reviewed:: (P) Yes Do you feel safe going back to the place where you live?: (P) Yes      Need for Family Participation in Patient Care: (P) Yes (Comment) (Daughter) Care giver support system in place?: (P) Yes (comment) Current home services: (P) DME, Home PT Criminal Activity/Legal Involvement Pertinent to Current Situation/Hospitalization: (P) No - Comment as needed  Activities of Daily Living Home Assistive Devices/Equipment: Cane (specify quad or straight) ADL Screening (condition at time of admission) Patient's cognitive ability adequate to safely complete daily activities?: Yes Is the patient deaf or have difficulty hearing?: No Does the patient have difficulty seeing, even when wearing glasses/contacts?: No Does the patient have difficulty concentrating, remembering, or making decisions?: No Patient able to express need for assistance with ADLs?: Yes Does the patient have difficulty dressing or bathing?: No Independently performs ADLs?: Yes (appropriate for developmental age) Does the patient have difficulty walking or climbing stairs?: No Weakness of Legs: Left Weakness of Arms/Hands: Left  Permission Sought/Granted Permission sought to share information with : (P) Case Manager Permission granted to share information with : (P) Yes,  Verbal Permission Granted     Permission granted to share info w AGENCY: (P) DME/HH        Emotional Assessment Appearance:: (P) Appears stated age   Affect (typically observed): (P) Accepting Orientation: : (P) Oriented to Place, Oriented to  Time, Oriented to Situation, Oriented to  Self Alcohol / Substance Use: (P) Not Applicable Psych Involvement: (P) No (comment)  Admission diagnosis:  TIA (transient ischemic attack) [G45.9] Numbness [R20.0] Stenosis of right carotid artery [I65.21] Patient Active Problem List   Diagnosis Date Noted   TIA (transient ischemic attack) 05/29/2022   Carotid artery stenosis 05/29/2022   Palpitations 05/25/2021   DKA (diabetic ketoacidoses) 01/26/2019   Esophagitis    Hyperbilirubinemia    AKI (acute kidney injury) ([place])    OSA on CPAP    Lacunar infarction ([place])    Mixed hyperlipidemia 07/31/2018   Abnormal EKG 07/31/2018   Chest pain 07/31/2018   Nausea vomiting and diarrhea 07/31/2018   Abdominal pain 07/31/2018   Elevated blood protein 07/31/2018   Type 2 diabetes mellitus without complication, with long-term current use of insulin ([place]) 07/31/2018   Anxiety    Depression 08/25/2017   Major depressive disorder, recurrent severe without psychotic features ([place]) 10/12/2015   Degenerative disc disease    Sickle cell trait ([place])    DDD (degenerative disc disease), lumbar    PCP:  [name], MD Pharmacy:   [facility] [place], [address] [place] [address] Phone: [phone] Fax: [phone]     Social Determinants of Health (SDOH) Social History: SDOH Screenings   Food Insecurity: No Food Insecurity (05/31/2022)  Housing: [place]  (05/31/2022)  Transportation Needs: No Transportation Needs (05/31/2022)  Utilities: Not At Risk (05/31/2022)  Tobacco Use: Low Risk  (05/29/2022)   SDOH Interventions:     Readmission Risk Interventions     No data to display

## 2022-06-01 DIAGNOSIS — G459 Transient cerebral ischemic attack, unspecified: Secondary | ICD-10-CM | POA: Diagnosis not present

## 2022-06-01 LAB — GLUCOSE, CAPILLARY
Glucose-Capillary: 374 mg/dL — ABNORMAL HIGH (ref 70–99)
Glucose-Capillary: 376 mg/dL — ABNORMAL HIGH (ref 70–99)
Glucose-Capillary: 325 mg/dL — ABNORMAL HIGH (ref 70–99)

## 2022-06-01 MED ORDER — CLOPIDOGREL BISULFATE 75 MG PO TABS: 75.0000 mg | ORAL_TABLET | Freq: Every day | ORAL | 2 refills | Status: AC

## 2022-06-01 MED ORDER — ATORVASTATIN CALCIUM 80 MG PO TABS: 80.0000 mg | ORAL_TABLET | Freq: Every day | ORAL | 2 refills | Status: DC

## 2022-06-01 MED ORDER — ASPIRIN 81 MG PO TBEC: 81.0000 mg | DELAYED_RELEASE_TABLET | Freq: Every day | ORAL | 2 refills | Status: AC

## 2022-06-01 NOTE — [facility] Notes (Signed)
STROKE TEAM [facility] NOTE   INTERVAL HISTORY No family at the bedside. Patient is sitting up in the be in NAD Patient states her symptoms are better  MRI brain and cervical spine canceled as MRI team is not comfortable doing it here.  MRI will have to be done as an outpatient at [facility] where she is an MRI before.. She does have a spinal stimulator that is MRI compatible.  No new neurological symptoms. Exam stable   Vitals:   05/31/22 1945 05/31/22 2313 06/01/22 0300 06/01/22 0808  BP: (!) 150/79 132/70 117/64 120/72  Pulse: 92 93 83 91  Resp: (!) 23 17 (!) 22 16  Temp: 98 F (36.7 C) 98.1 F (36.7 C) 98 F (36.7 C) 98.7 F (37.1 C)  TempSrc: Oral Oral Oral Oral  SpO2: 100% 98% 98% 98%  Weight:      Height:       CBC:  Recent Labs  Lab 05/29/22 1654 05/29/22 1741 05/30/22 0356 05/31/22 0716  WBC 9.8   < > 8.7 6.1  NEUTROABS 5.9  --   --   --   HGB 13.2   < > 12.8 11.9*  HCT 40.0   < > 39.4 35.4*  MCV 88.9   < > 87.9 87.2  PLT 359   < > 334 304   < > = values in this interval not displayed.   Basic Metabolic Panel:  Recent Labs  Lab 05/30/22 0356 05/31/22 0716  NA 138 135  K 4.3 4.7  CL 105 105  CO2 26 21*  GLUCOSE 111* 426*  BUN 11 12  CREATININE 0.87 0.99  CALCIUM 8.4* 8.0*   Lipid Panel:  Recent Labs  Lab 05/30/22 0356  CHOL 202*  TRIG 81  HDL 31*  CHOLHDL 6.5  VLDL 16  LDLCALC 155*   HgbA1c:  Recent Labs  Lab 05/29/22 2322  HGBA1C 10.0*   Urine Drug Screen:  Recent Labs  Lab 05/29/22 1654  LABOPIA NONE DETECTED  COCAINSCRNUR NONE DETECTED  LABBENZ NONE DETECTED  AMPHETMU NONE DETECTED  THCU POSITIVE*  LABBARB NONE DETECTED    Alcohol Level  Recent Labs  Lab 05/29/22 1705  ETH <10    IMAGING past 24 hours No results found.  PHYSICAL EXAM  Temp:  [97.9 F (36.6 C)-98.7 F (37.1 C)] 98.7 F (37.1 C) (02/01 0808) Pulse Rate:  [83-93] 91 (02/01 0808) Resp:  [16-23] 16 (02/01 0808) BP: (117-150)/(64-79) 120/72 (02/01  0808) SpO2:  [98 %-100 %] 98 % (02/01 0808)  General -obese middle-aged African-American lady, in no apparent distress. Cardiovascular - Regular rhythm and rate.  Mental Status -  Level of arousal and orientation to time, place, and person were intact. Language including expression, naming, repetition, comprehension was assessed and found intact. Attention span and concentration were normal. Recent and remote memory were intact. Fund of Knowledge was assessed and was intact.  Cranial Nerves II - XII - II - Visual field intact OU. III, IV, VI - Extraocular movements intact. V - Facial sensation intact bilaterally. VII - Facial movement intact bilaterally. VIII - Hearing & vestibular intact bilaterally. X - Palate elevates symmetrically. XI - Chin turning & shoulder shrug intact bilaterally. XII - Tongue protrusion intact.  Motor Strength - The patient's strength was normal in all extremities and pronator drift was absent.  Bulk was normal and fasciculations were absent.  Left dorsiflexion/flexion is weak Motor Tone - Muscle tone was assessed at the neck and appendages and was  normal.  Sensory - Light touch, temperature/pinprick were assessed and were symmetrical.    Coordination - The patient had normal movements in the hands and feet with no ataxia or dysmetria.  Tremor was absent.  Gait and Station - deferred.  ASSESSMENT/PLAN Karen Stein is a 52 y.o. female with history of diabetes, hyperlipidemia, obstructive sleep apnea not currently on CPAP, BMI 36, chronic back pain s/p neurostimulator placement July 2022, anxiety/depression   Left leg weakness numbness, pain and cramps likely L5-S1 lumbar radiculopathy..  Doubt TIA  CT head 1. No acute intracranial abnormality. No skull fracture. 2. 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 CTA head & neck  1. No acute intracranial process. 2. Severe stenosis in the distal right ICA, just proximal to the terminus.  Moderate stenosis in the proximal left A1 and mild stenosis in the bilateral cavernous and supraclinoid ICAs. 3. No hemodynamically significant stenosis in the neck. MRI  brain ordered MRI cervical Spine ordered 2D Echo EF 60-65%. There is mild asymmetric left ventricular  hypertrophy of the basal-septal segment  LDL 155 HgbA1c 10.0 VTE prophylaxis - heparin SQ    Diet   Diet heart healthy/carb modified Room service appropriate? Yes; Fluid consistency: Thin   No antithrombotic prior to admission, now on aspirin 81 mg daily and clopidogrel 75 mg daily. For 3 weeks than ASA alone  Therapy recommendations:  pending Disposition:  pending  Hypertension Home meds:  none Stable Permissive hypertension (OK if < 220/120) but gradually normalize in 5-7 days Long-term BP goal normotensive  Hyperlipidemia Home meds:  lipitor '20mg'$ , resumed in hospital (increased to '80mg'$ )  LDL 155, goal < 70 Continue statin at discharge  Diabetes type II UnControlled Home meds:  insulin HgbA1c 10.0, goal < 7.0 CBGs Recent Labs    06/01/22 0538 06/01/22 0807 06/01/22 1119  GLUCAP 374* 376* 325*    SSI  Other Stroke Risk Factors Substance abuse - UDS:  THC POSITIVE, Cocaine NONE DETECTED. Patient advised to stop using due to stroke risk. Obesity, Body mass index is 36.34 kg/m., BMI >/= 30 associated with increased stroke risk, recommend weight loss, diet and exercise as appropriate  Hospital day # 3   Patient presented with left leg cramps followed by some backache and left leg weakness and numbness likely from lumbar radiculopathy given her prior history of degenerative spine disease.  Doubt TIA in the absence of any symptoms affecting the face or significant arm weakness.  Recommend check MRI scan of the spine as an outpatient at [facility].  She has spinal cord stimulator but it is MRI compatible but will need coordination to get this done.  Discussed with patient and Dr.Dahal.  Patient can discharge  home.  Aspirin 81 mg daily alone.  Greater than 50% time during this 35-minute visit was spent in counseling and coordination of care and discussion patient and care team and answering questions.  Antony Contras, MD Medical Director [facility] Pager: [phone] 06/01/2022 11:31 AM  To contact Stroke Continuity provider, please refer to [email or web address] After hours, contact General Neurology

## 2022-06-01 NOTE — Inpatient Diabetes Management (Signed)
Inpatient Diabetes Program Recommendations  AACE/ADA: New Consensus Statement on Inpatient Glycemic Control (2015)  Target Ranges:  Prepandial:   less than 140 mg/dL      Peak postprandial:   less than 180 mg/dL (1-2 hours)      Critically ill patients:  140 - 180 mg/dL   Lab Results  Component Value Date   GLUCAP 376 (H) 06/01/2022   HGBA1C 10.0 (H) 05/29/2022    Review of Glycemic Control  Latest Reference Range & Units 05/31/22 08:13 05/31/22 13:04 05/31/22 16:36 05/31/22 21:33 06/01/22 05:38 06/01/22 08:07  Glucose-Capillary 70 - 99 mg/dL 438 (H) 316 (H) 243 (H) 402 (H) 374 (H) 376 (H)  (H): Data is abnormally high  Diabetes history: DM2   Outpatient Diabetes medications:  Ozempic weekly Omnipod insulin pump using Fiasp Basal 12a-12a-1.4 units/hr Total basal=33.6 units daily Insulin carb ratio 12a-6:30p-1 unit for every 9 carbs 6:30p-12a-1unit for every 8 carbs Insulin sensitivity factor-1 units drops her 34 mg/dL Target BG-120 mg/dl Correction starts at 150 mg/dl   Current orders for Inpatient glycemic control:  Semglee 15 unit BID Novolog 0-9 units TID and 0-5 units QHS  Inpatient Diabetes Program Recommendations:    Semglee 20 units BID Novolog 0-15 units TID  Novolog 4 units TID with meals  Will continue to follow while inpatient.  Thank you, Karen Dixon, MSN, [place] Diabetes Coordinator Inpatient Diabetes Program [phone] (team pager from 8a-5p)

## 2022-06-01 NOTE — [facility] Notes (Signed)
PT Cancellation Note  Patient Details Name: Karen Stein MRN: [id number] DOB: [birth year]   Cancelled Treatment:    Reason Eval/Treat Not Completed: Patient declined, no reason specified politely declines PT today, has been up and walking in her room with no concerns or difficulty and per pt likely going home today.   Will check on her again if she is still in house tomorrow, may take her off caseload at that point if she is still has no concerns mobilizing.   Deniece Ree PT DPT PN2

## 2022-06-01 NOTE — Discharge Summary (Signed)
Physician Discharge Summary  Karen Stein IRC:[id number] DOB: December 07, 1970 DOA: 05/29/2022  PCP: [name], MD  Admit date: 05/29/2022 Discharge date: 06/01/2022  Admitted From: Home Discharge disposition: Home  Recommendations at discharge:  Continue aspirin, Plavix and statin Continue insulin pump under the care of her endocrinologist   Brief narrative: Karen Stein is a 52 y.o. female with PMH significant for DM2, HLD, obesity, OSA, chronic back pain status post neurostimulator in July 2022, peripheral neuropathy, gastroparesis, anxiety/depression 1/29, patient presented to the ED with complaint of lower extremity weakness. Patient reported that the previous day she woke up with a bad headache and after a few hours she developed numbness in her left leg which persisted into the next day.  Next day, she also felt wobbly leg and was unable to stand and hence presented to the ED.  In the ED, CT head unremarkable CTA head and neck showed severe stenosis in distal right RCA, just proximal to the terminus, moderate stenosis in proximal left A1 and mild stenosis in the bilateral cavernous and supraclinoid ICAs.   Seen by neurology Admitted to [facility] for stroke workup  Subjective: Patient was seen and examined this morning.  Sitting up in recliner.  Not in distress.  No new symptoms.  Assessment and plan: Left leg weakness numbness, pain and cramps Initially presented with symptoms as above. Patient was admitted with a concern of possible TIA. CT head and CT a head as above.   MRI brain and cervical spine were ordered but could not be done because of the presence of spinal stimulator which is not compatible with MRI. Neurology consult was obtained.  Discussed with neurologist Dr. Leonie Man. Per neurology evaluation, patient's symptoms are more likely to be secondary to L5-S1 lumbar radiculopathy.  Unlikely to be TIA. Previously, patient was on aspirin, Plavix and Lipitor which  she will continue Patient will follow-up with her providers at [facility] if MRI is needed.  Carotid artery stenosis CTA head and neck showed severe stenosis in distal right RCA, just proximal to the terminus, moderate stenosis in proximal left A1 and mild stenosis in the bilateral cavernous and supraclinoid ICAs.   Patient states she was seen by vascular surgery as an outpatient.  She reports that was offered stenting but she did not want to do it because of the risks associated. She is medically managed on aspirin, Plavix and statin.   Mixed hyperlipidemia LDL 155, HDL 31, cholesterol 202 Started on Lipitor 80 mg daily.   Uncontrolled type 2 diabetes mellitus with hyperglycemia A1c 10 on 05/29/2022 PTA on Ozempic weekly, insulin pump and managed under the care of endocrinologist In the hospital, patient was managed with long-acting insulin and Premeal sliding scale insulin. Post discharge, patient will continue same regimen.  She definitely needs adjustment of insulin given her high A1c. Diabetes care coordinator consulted  History of gastroparesis Continue Reglan   Diabetic neuropathy Continue Lyrica, Robaxin, oxycodone   Obesity  Body mass index is 36.34 kg/m. Patient has been advised to make an attempt to improve diet and exercise patterns to aid in weight loss.   Mobility: PT eval obtained.  Home with PT recommended  Goals of care   Code Status: Full Code   Wounds:  -    Discharge Exam:   Vitals:   05/31/22 1945 05/31/22 2313 06/01/22 0300 06/01/22 0808  BP: (!) 150/79 132/70 117/64 120/72  Pulse: 92 93 83 91  Resp: (!) 23 17 (!) 22 16  Temp:  12 F (36.7 C) 98.1 F (36.7 C) 98 F (36.7 C) 98.7 F (37.1 C)  TempSrc: Oral Oral Oral Oral  SpO2: 100% 98% 98% 98%  Weight:      Height:        Body mass index is 36.34 kg/m.  General exam: Pleasant middle-aged African-American female.  Not in distress Skin: No rashes, lesions or ulcers. HEENT: Atraumatic,  normocephalic, no obvious bleeding Lungs: Clear to auscultation bilaterally CVS: Regular rate and rhythm, no murmur GI/Abd soft, nontender, nondistended, bowel sound present CNS: Alert, awake, oriented x 3 Psychiatry: Mood appropriate Extremities: No pedal edema, no calf tenderness  Follow ups:    Follow-up Information     [name], MD Follow up.   Specialty: Internal Medicine Contact information: [place] [address] [phone]                 Discharge Instructions:   Discharge Instructions     Call MD for:  difficulty breathing, headache or visual disturbances   Complete by: As directed    Call MD for:  extreme fatigue   Complete by: As directed    Call MD for:  hives   Complete by: As directed    Call MD for:  persistant dizziness or light-headedness   Complete by: As directed    Call MD for:  persistant nausea and vomiting   Complete by: As directed    Call MD for:  severe uncontrolled pain   Complete by: As directed    Call MD for:  temperature >100.4   Complete by: As directed    Diet Carb Modified   Complete by: As directed    Diet general   Complete by: As directed    Discharge instructions   Complete by: As directed    Recommendations at discharge:   Continue aspirin, Plavix and statin  Continue insulin pump under the care of her endocrinologist   Discharge instructions for diabetes mellitus: Check blood sugar 3 times a day and bedtime at home. If blood sugar running above 200 or less than 70 please call your MD to adjust insulin. If you notice signs and symptoms of hypoglycemia (low blood sugar) like jitteriness, confusion, thirst, tremor and sweating, please check blood sugar, drink sugary drink/biscuits/sweets to increase sugar level and call MD or return to ER.    General discharge instructions: Follow with Primary MD [name], MD in 7 days  Please request your PCP  to go over your hospital  tests, procedures, radiology results at the follow up. Please get your medicines reviewed and adjusted.  Your PCP may decide to repeat certain labs or tests as needed. Do not drive, operate heavy machinery, perform activities at heights, swimming or participation in water activities or provide baby sitting services if your were admitted for syncope or siezures until you have seen by Primary MD or a Neurologist and advised to do so again. [place] Controlled Substance Reporting System database was reviewed. Do not drive, operate heavy machinery, perform activities at heights, swim, participate in water activities or provide baby-sitting services while on medications for pain, sleep and mood until your outpatient physician has reevaluated you and advised to do so again.  You are strongly recommended to comply with the dose, frequency and duration of prescribed medications. Activity: As tolerated with Full fall precautions use walker/cane & assistance as needed Avoid using any recreational substances like cigarette, tobacco, alcohol, or non-prescribed drug. If you experience worsening of  your admission symptoms, develop shortness of breath, life threatening emergency, suicidal or homicidal thoughts you must seek medical attention immediately by calling 911 or calling your MD immediately  if symptoms less severe. You must read complete instructions/literature along with all the possible adverse reactions/side effects for all the medicines you take and that have been prescribed to you. Take any new medicine only after you have completely understood and accepted all the possible adverse reactions/side effects.  Wear Seat belts while driving. You were cared for by a hospitalist during your hospital stay. If you have any questions about your discharge medications or the care you received while you were in the hospital after you are discharged, you can call the unit and ask to speak with the hospitalist or the  covering physician. Once you are discharged, your primary care physician will handle any further medical issues. Please note that NO REFILLS for any discharge medications will be authorized once you are discharged, as it is imperative that you return to your primary care physician (or establish a relationship with a primary care physician if you do not have one).   Increase activity slowly   Complete by: As directed        Discharge Medications:   Allergies as of 06/01/2022       Reactions   Amoxicillin Nausea And Vomiting   Amoxicillin-pot Clavulanate Nausea And Vomiting   Macrobid [nitrofurantoin Macrocrystal] Nausea Only   Headache and "severe abd cramps"   Rosuvastatin Nausea And Vomiting   Shellfish Allergy Swelling   Itching of lips and ears, NOT allergic to iodine contrast   Sulfa Antibiotics Other (See Comments)   Dizzy and sees spots with sulfa drugs   Nitrofurantoin Other (See Comments), Itching, Nausea Only, Photosensitivity   "seeing spots" Sees "white spots"        Medication List     STOP taking these medications    benzonatate 100 MG capsule Commonly known as: TESSALON   doxycycline 100 MG capsule Commonly known as: VIBRAMYCIN   fluconazole 150 MG tablet Commonly known as: Diflucan   metoCLOPramide 10 MG tablet Commonly known as: REGLAN   promethazine-dextromethorphan 6.25-15 MG/5ML syrup Commonly known as: PROMETHAZINE-DM       TAKE these medications    albuterol 108 (90 Base) MCG/ACT inhaler Commonly known as: VENTOLIN HFA Inhale 1-2 puffs into the lungs every 6 (six) hours as needed for wheezing or shortness of breath.   aspirin EC 81 MG tablet Take 1 tablet (81 mg total) by mouth daily. Swallow whole. Start taking on: June 02, 2022   atorvastatin 80 MG tablet Commonly known as: LIPITOR Take 1 tablet (80 mg total) by mouth at bedtime. What changed:  medication strength how much to take when to take this   ciclopirox 8 %  solution Commonly known as: PENLAC Apply 1 Application topically daily as needed (Skin infection).   clopidogrel 75 MG tablet Commonly known as: PLAVIX Take 1 tablet (75 mg total) by mouth daily. Start taking on: June 02, 2022   diclofenac Sodium 1 % Gel Commonly known as: VOLTAREN Apply 4 g topically 4 (four) times daily.   Fiasp 100 UNIT/ML Soln Generic drug: Insulin Aspart (w/Niacinamide) Inject 5-16 Units into the skin with breakfast, with lunch, and with evening meal. Per sliding scale   lidocaine 5 % Commonly known as: Lidoderm Place 1 patch onto the skin daily. Remove & Discard patch within 12 hours or as directed by MD What changed:  when to take  this reasons to take this additional instructions   methocarbamol 500 MG tablet Commonly known as: ROBAXIN Take 1 tablet (500 mg total) by mouth every 8 (eight) hours as needed for muscle spasms.   ondansetron 4 MG disintegrating tablet Commonly known as: ZOFRAN-ODT Take 1 tablet (4 mg total) by mouth every 8 (eight) hours as needed for nausea or vomiting.   Oxycodone HCl 10 MG Tabs Take 10 mg by mouth every 6 (six) hours.   Ozempic (0.25 or 0.5 MG/DOSE) 2 MG/1.5ML Sopn Generic drug: Semaglutide(0.25 or 0.'5MG'$ /DOS) Inject 0.4 mLs into the skin once a week.   pregabalin 200 MG capsule Commonly known as: LYRICA Take 200 mg by mouth 3 (three) times daily.               Durable Medical Equipment  (From admission, onward)           Start     Ordered   05/31/22 1458  For home use only DME Bedside commode  Once       Question:  Patient needs a bedside commode to treat with the following condition  Answer:  TIA (transient ischemic attack)   05/31/22 1458             The results of significant diagnostics from this hospitalization (including imaging, microbiology, ancillary and laboratory) are listed below for reference.    Procedures and Diagnostic Studies:   ECHOCARDIOGRAM COMPLETE  Result Date:  05/30/2022    ECHOCARDIOGRAM REPORT   Patient Name:   Karen Stein Date of Exam: 05/30/2022 Medical Rec #:  [id number]       Height:       68.0 in Accession #:    [id number]      Weight:       239.0 lb Date of Birth:  December 04, 1970      BSA:          2.204 m Patient Age:    23 years        BP:           139/73 mmHg Patient Gender: F               HR:           78 bpm. Exam Location:  Inpatient Procedure: 2D Echo, Cardiac Doppler and Color Doppler Indications:    TIA  History:        Patient has prior history of Echocardiogram examinations. Risk                 Factors:Diabetes and Dyslipidemia.  Sonographer:    Phineas Douglas Referring Phys: [phone] DEBBY CROSLEY IMPRESSIONS  1. Left ventricular ejection fraction, by estimation, is 60 to 65%. The left ventricle has normal function. The left ventricle has no regional wall motion abnormalities. There is mild asymmetric left ventricular hypertrophy of the basal-septal segment. Left ventricular diastolic parameters were normal.  2. Right ventricular systolic function is normal. The right ventricular size is normal. Tricuspid regurgitation signal is inadequate for assessing PA pressure.  3. The mitral valve is grossly normal. No evidence of mitral valve regurgitation. No evidence of mitral stenosis.  4. The aortic valve is tricuspid. Aortic valve regurgitation is not visualized. No aortic stenosis is present. Conclusion(s)/Recommendation(s): No intracardiac source of embolism detected on this transthoracic study. Consider a transesophageal echocardiogram to exclude cardiac source of embolism if clinically indicated. FINDINGS  Left Ventricle: Left ventricular ejection fraction, by estimation, is 60 to 65%. The left ventricle has normal  function. The left ventricle has no regional wall motion abnormalities. The left ventricular internal cavity size was normal in size. There is  mild asymmetric left ventricular hypertrophy of the basal-septal segment. Left ventricular diastolic  parameters were normal. Right Ventricle: The right ventricular size is normal. No increase in right ventricular wall thickness. Right ventricular systolic function is normal. Tricuspid regurgitation signal is inadequate for assessing PA pressure. Left Atrium: Left atrial size was normal in size. Right Atrium: Right atrial size was normal in size. Pericardium: Trivial pericardial effusion is present. Mitral Valve: The mitral valve is grossly normal. No evidence of mitral valve regurgitation. No evidence of mitral valve stenosis. Tricuspid Valve: The tricuspid valve is grossly normal. Tricuspid valve regurgitation is not demonstrated. No evidence of tricuspid stenosis. Aortic Valve: The aortic valve is tricuspid. Aortic valve regurgitation is not visualized. No aortic stenosis is present. Pulmonic Valve: The pulmonic valve was grossly normal. Pulmonic valve regurgitation is not visualized. No evidence of pulmonic stenosis. Aorta: The aortic root and ascending aorta are structurally normal, with no evidence of dilitation. Venous: The inferior vena cava was not well visualized. IAS/Shunts: The atrial septum is grossly normal.  LEFT VENTRICLE PLAX 2D LVIDd:         4.30 cm      Diastology LVIDs:         2.50 cm      LV e' medial:    8.16 cm/s LV PW:         1.10 cm      LV E/e' medial:  13.0 LV IVS:        1.30 cm      LV e' lateral:   10.70 cm/s LVOT diam:     1.90 cm      LV E/e' lateral: 9.9 LV SV:         64 LV SV Index:   29 LVOT Area:     2.84 cm  LV Volumes (MOD) LV vol d, MOD A2C: 97.5 ml LV vol d, MOD A4C: 101.0 ml LV vol s, MOD A2C: 31.8 ml LV vol s, MOD A4C: 36.6 ml LV SV MOD A2C:     65.7 ml LV SV MOD A4C:     101.0 ml LV SV MOD BP:      65.0 ml RIGHT VENTRICLE RV Basal diam:  3.50 cm RV S prime:     18.10 cm/s TAPSE (M-mode): 2.2 cm LEFT ATRIUM           Index LA diam:      3.30 cm 1.50 cm/m LA Vol (A2C): 41.9 ml 19.01 ml/m LA Vol (A4C): 34.9 ml 15.83 ml/m  AORTIC VALVE LVOT Vmax:   105.00 cm/s LVOT  Vmean:  68.700 cm/s LVOT VTI:    0.226 m  AORTA Ao Root diam: 2.80 cm Ao Asc diam:  2.80 cm MITRAL VALVE MV Area (PHT): 4.10 cm     SHUNTS MV Decel Time: 185 msec     Systemic VTI:  0.23 m MV E velocity: 106.00 cm/s  Systemic Diam: 1.90 cm MV A velocity: 96.70 cm/s MV E/A ratio:  1.10 Eleonore Chiquito MD Electronically signed by Eleonore Chiquito MD Signature Date/Time: 05/30/2022/3:36:34 PM    Final    CT ANGIO HEAD NECK W WO CM  Result Date: 05/29/2022 CLINICAL DATA:  Numbness in the left arm, left leg; right headache and jaw pain EXAM: CT ANGIOGRAPHY HEAD AND NECK TECHNIQUE: Multidetector CT imaging of the head and neck was performed using  the standard protocol during bolus administration of intravenous contrast. Multiplanar CT image reconstructions and MIPs were obtained to evaluate the vascular anatomy. Carotid stenosis measurements (when applicable) are obtained utilizing NASCET criteria, using the distal internal carotid diameter as the denominator. RADIATION DOSE REDUCTION: This exam was performed according to the departmental dose-optimization program which includes automated exposure control, adjustment of the mA and/or kV according to patient size and/or use of iterative reconstruction technique. CONTRAST:  15m OMNIPAQUE IOHEXOL 350 MG/ML SOLN COMPARISON:  03/17/2021 CTA head and neck, correlation is also made with 04/03/2022 CT head FINDINGS: CT HEAD FINDINGS Brain: No evidence of acute infarct, hemorrhage, mass, mass effect, or midline shift. No hydrocephalus or extra-axial fluid collection. Vascular: No hyperdense vessel. Skull: Negative for fracture or focal lesion. Sinuses/Orbits: No acute finding. Other: The mastoid air cells are well aerated. CTA NECK FINDINGS Aortic arch: Two-vessel arch with a common origin of the brachiocephalic and left common carotid arteries. Imaged portion shows no evidence of aneurysm or dissection. No significant stenosis of the major arch vessel origins. Right carotid  system: No evidence of dissection, occlusion, or hemodynamically significant stenosis (greater than 50%). Left carotid system: No evidence of dissection, occlusion, or hemodynamically significant stenosis (greater than 50%). Vertebral arteries: No evidence of dissection, occlusion, or hemodynamically significant stenosis (greater than 50%). Skeleton: No acute osseous abnormality. Degenerative changes in the cervical spine. Edentulous. Other neck: Negative. Upper chest: Negative. Review of the MIP images confirms the above findings CTA HEAD FINDINGS Anterior circulation: Both internal carotid arteries are patent to the termini, with mild stenosis in the bilateral cavernous and supraclinoid ICAs. Severe stenosis in the distal right ICA, just proximal to the terminus (series 11, image 244). A1 segments patent, with moderate stenosis in the proximal left A1. Normal anterior communicating artery. Anterior cerebral arteries are patent to their distal aspects. No M1 stenosis or occlusion. MCA branches perfused and symmetric. Posterior circulation: Vertebral arteries patent to the vertebrobasilar junction without significant stenosis posterior inferior cerebellar arteries patent proximally. Basilar patent to its distal aspect. Superior cerebellar arteries patent proximally. Patent P1 segments. PCAs perfused to their distal aspects without stenosis. The bilateral posterior communicating arteries are patent. Venous sinuses: As permitted by contrast timing, patent. Anatomic variants: None significant. Review of the MIP images confirms the above findings IMPRESSION: 1. No acute intracranial process. 2. Severe stenosis in the distal right ICA, just proximal to the terminus. Moderate stenosis in the proximal left A1 and mild stenosis in the bilateral cavernous and supraclinoid ICAs. 3. No hemodynamically significant stenosis in the neck. Electronically Signed   By: AMerilyn BabaM.D.   On: 05/29/2022 19:05     Labs:   Basic  Metabolic Panel: Recent Labs  Lab 05/29/22 1654 05/29/22 1741 05/29/22 2322 05/30/22 0356 05/31/22 0716  NA 135 138  --  138 135  K 3.9 3.8  --  4.3 4.7  CL 102 100  --  105 105  CO2 24  --   --  26 21*  GLUCOSE 170* 166*  --  111* 426*  BUN 10 7  --  11 12  CREATININE 0.89 0.90 0.91 0.87 0.99  CALCIUM 8.2*  --   --  8.4* 8.0*   GFR Estimated Creatinine Clearance: 84.8 mL/min (by C-G formula based on SCr of 0.99 mg/dL). Liver Function Tests: Recent Labs  Lab 05/29/22 1654  AST 22  ALT 11  ALKPHOS 96  BILITOT 0.5  PROT 7.6  ALBUMIN 3.6   No results  for input(s): "LIPASE", "AMYLASE" in the last 168 hours. No results for input(s): "AMMONIA" in the last 168 hours. Coagulation profile Recent Labs  Lab 05/29/22 1654  INR 1.0    CBC: Recent Labs  Lab 05/29/22 1654 05/29/22 1741 05/29/22 2322 05/30/22 0356 05/31/22 0716  WBC 9.8  --  9.2 8.7 6.1  NEUTROABS 5.9  --   --   --   --   HGB 13.2 13.6 12.7 12.8 11.9*  HCT 40.0 40.0 38.3 39.4 35.4*  MCV 88.9  --  87.2 87.9 87.2  PLT 359  --  324 334 304   Cardiac Enzymes: No results for input(s): "CKTOTAL", "CKMB", "CKMBINDEX", "TROPONINI" in the last 168 hours. BNP: Invalid input(s): "POCBNP" CBG: Recent Labs  Lab 05/31/22 1636 05/31/22 2133 06/01/22 0538 06/01/22 0807 06/01/22 1119  GLUCAP 243* 402* 374* 376* 325*   D-Dimer No results for input(s): "DDIMER" in the last 72 hours. Hgb A1c Recent Labs    05/29/22 2322  HGBA1C 10.0*   Lipid Profile Recent Labs    05/30/22 0356  CHOL 202*  HDL 31*  LDLCALC 155*  TRIG 81  CHOLHDL 6.5   Thyroid function studies Recent Labs    05/30/22 0356  TSH 1.631   Anemia work up No results for input(s): "VITAMINB12", "FOLATE", "FERRITIN", "TIBC", "IRON", "RETICCTPCT" in the last 72 hours. Microbiology No results found for this or any previous visit (from the past 240 hour(s)).  Time coordinating discharge: 35 minutes  Signed: Stirling Orton  [facility] 06/01/2022, 1:00 PM

## 2022-06-05 DIAGNOSIS — G4733 Obstructive sleep apnea (adult) (pediatric): Secondary | ICD-10-CM | POA: Diagnosis not present

## 2022-06-05 DIAGNOSIS — I119 Hypertensive heart disease without heart failure: Secondary | ICD-10-CM | POA: Diagnosis not present

## 2022-06-05 DIAGNOSIS — M797 Fibromyalgia: Secondary | ICD-10-CM | POA: Diagnosis not present

## 2022-06-05 DIAGNOSIS — E669 Obesity, unspecified: Secondary | ICD-10-CM | POA: Diagnosis not present

## 2022-06-05 DIAGNOSIS — E785 Hyperlipidemia, unspecified: Secondary | ICD-10-CM | POA: Diagnosis not present

## 2022-06-05 DIAGNOSIS — Z9284 Personal history of unintended awareness under general anesthesia: Secondary | ICD-10-CM | POA: Diagnosis not present

## 2022-06-05 DIAGNOSIS — E1142 Type 2 diabetes mellitus with diabetic polyneuropathy: Secondary | ICD-10-CM | POA: Diagnosis not present

## 2022-06-05 DIAGNOSIS — R531 Weakness: Secondary | ICD-10-CM | POA: Diagnosis not present

## 2022-06-05 DIAGNOSIS — I4891 Unspecified atrial fibrillation: Secondary | ICD-10-CM | POA: Diagnosis not present

## 2022-06-14 DIAGNOSIS — Z4681 Encounter for fitting and adjustment of insulin pump: Secondary | ICD-10-CM | POA: Diagnosis not present

## 2022-06-14 DIAGNOSIS — Z794 Long term (current) use of insulin: Secondary | ICD-10-CM | POA: Diagnosis not present

## 2022-06-14 DIAGNOSIS — E1165 Type 2 diabetes mellitus with hyperglycemia: Secondary | ICD-10-CM | POA: Diagnosis not present

## 2022-06-14 DIAGNOSIS — Z9641 Presence of insulin pump (external) (internal): Secondary | ICD-10-CM | POA: Diagnosis not present

## 2022-08-19 ENCOUNTER — Encounter (HOSPITAL_BASED_OUTPATIENT_CLINIC_OR_DEPARTMENT_OTHER): Payer: Self-pay

## 2022-08-19 ENCOUNTER — Other Ambulatory Visit: Payer: Self-pay

## 2022-08-19 ENCOUNTER — Emergency Department (HOSPITAL_BASED_OUTPATIENT_CLINIC_OR_DEPARTMENT_OTHER)
Admission: EM | Admit: 2022-08-19 | Discharge: 2022-08-19 | Disposition: A | Discharge status: Home / Self Care | Payer: No Typology Code available for payment source | Attending: Emergency Medicine | Admitting: Emergency Medicine

## 2022-08-19 DIAGNOSIS — Z794 Long term (current) use of insulin: Secondary | ICD-10-CM | POA: Insufficient documentation

## 2022-08-19 DIAGNOSIS — Z8673 Personal history of transient ischemic attack (TIA), and cerebral infarction without residual deficits: Secondary | ICD-10-CM | POA: Diagnosis not present

## 2022-08-19 DIAGNOSIS — Z7982 Long term (current) use of aspirin: Secondary | ICD-10-CM | POA: Diagnosis not present

## 2022-08-19 DIAGNOSIS — Z7984 Long term (current) use of oral hypoglycemic drugs: Secondary | ICD-10-CM | POA: Diagnosis not present

## 2022-08-19 DIAGNOSIS — Z7902 Long term (current) use of antithrombotics/antiplatelets: Secondary | ICD-10-CM | POA: Diagnosis not present

## 2022-08-19 DIAGNOSIS — E1165 Type 2 diabetes mellitus with hyperglycemia: Secondary | ICD-10-CM | POA: Insufficient documentation

## 2022-08-19 DIAGNOSIS — E860 Dehydration: Secondary | ICD-10-CM | POA: Insufficient documentation

## 2022-08-19 DIAGNOSIS — R252 Cramp and spasm: Secondary | ICD-10-CM | POA: Diagnosis present

## 2022-08-19 DIAGNOSIS — M791 Myalgia, unspecified site: Secondary | ICD-10-CM | POA: Insufficient documentation

## 2022-08-19 LAB — COMPREHENSIVE METABOLIC PANEL
ALT: 15 U/L (ref 0–44)
AST: 12 U/L — ABNORMAL LOW (ref 15–41)
Albumin: 3.8 g/dL (ref 3.5–5.0)
Alkaline Phosphatase: 133 U/L — ABNORMAL HIGH (ref 38–126)
Anion gap: 8 (ref 5–15)
BUN: 20 mg/dL (ref 6–20)
CO2: 27 mmol/L (ref 22–32)
Calcium: 8.9 mg/dL (ref 8.9–10.3)
Chloride: 98 mmol/L (ref 98–111)
Creatinine, Ser: 1.03 mg/dL — ABNORMAL HIGH (ref 0.44–1.00)
GFR, Estimated: >60 mL/min (ref >60)
Glucose, Bld: 294 mg/dL — ABNORMAL HIGH (ref 70–99)
Potassium: 4.3 mmol/L (ref 3.5–5.1)
Sodium: 133 mmol/L — ABNORMAL LOW (ref 135–145)
Total Bilirubin: 0.5 mg/dL (ref 0.3–1.2)
Total Protein: 7.8 g/dL (ref 6.5–8.1)

## 2022-08-19 LAB — CBC WITH DIFFERENTIAL/PLATELET
Abs Immature Granulocytes: 0.01 10*3/uL (ref 0.00–0.07)
Basophils Absolute: 0.1 10*3/uL (ref 0.0–0.1)
Basophils Relative: 1 %
Eosinophils Absolute: 0.2 10*3/uL (ref 0.0–0.5)
Eosinophils Relative: 3 %
HCT: 41.3 % (ref 36.0–46.0)
Hemoglobin: 13.7 g/dL (ref 12.0–15.0)
Immature Granulocytes: 0 %
Lymphocytes Relative: 32 %
Lymphs Abs: 2.8 10*3/uL (ref 0.7–4.0)
MCH: 28.4 pg (ref 26.0–34.0)
MCHC: 33.2 g/dL (ref 30.0–36.0)
MCV: 85.7 fL (ref 80.0–100.0)
Monocytes Absolute: 0.6 10*3/uL (ref 0.1–1.0)
Monocytes Relative: 7 %
Neutro Abs: 5.1 10*3/uL (ref 1.7–7.7)
Neutrophils Relative %: 57 %
Platelets: 299 10*3/uL (ref 150–400)
RBC: 4.82 MIL/uL (ref 3.87–5.11)
RDW: 12.8 % (ref 11.5–15.5)
WBC: 8.8 10*3/uL (ref 4.0–10.5)
nRBC: 0 % (ref 0.0–0.2)

## 2022-08-19 LAB — URINALYSIS, ROUTINE W REFLEX MICROSCOPIC
Bacteria, UA: NONE SEEN
Bilirubin Urine: NEGATIVE
Glucose, UA: 500 mg/dL — AB
Ketones, ur: NEGATIVE mg/dL
Leukocytes,Ua: NEGATIVE
Nitrite: NEGATIVE
Protein, ur: NEGATIVE mg/dL
Specific Gravity, Urine: 1.016 (ref 1.005–1.030)
pH: 5.5 (ref 5.0–8.0)

## 2022-08-19 LAB — CK: Total CK: 87 U/L (ref 38–234)

## 2022-08-19 LAB — MAGNESIUM: Magnesium: 1.9 mg/dL (ref 1.7–2.4)

## 2022-08-19 LAB — LIPASE, BLOOD: Lipase: 26 U/L (ref 11–51)

## 2022-08-19 MED ORDER — SODIUM CHLORIDE 0.9 % IV BOLUS
1000.0000 mL | Freq: Once | INTRAVENOUS | Status: AC
  Administered 2022-08-19: 1000 mL via INTRAVENOUS

## 2022-08-19 MED ORDER — FENTANYL CITRATE PF 50 MCG/ML IJ SOSY
50.0000 ug | PREFILLED_SYRINGE | Freq: Once | INTRAMUSCULAR | Status: AC
  Administered 2022-08-19: 50 ug via INTRAVENOUS
  Filled 2022-08-19: qty 1

## 2022-08-19 NOTE — ED Triage Notes (Signed)
Generalized abdominal pain, denies n/v or diarrhea. Normal BM yesterday  states pain from ribs down, worse with movement

## 2022-08-19 NOTE — ED Provider Notes (Signed)
[place] EMERGENCY DEPARTMENT AT [facility] Provider Note   CSN: [id number] Arrival date & time: 08/19/22  7829     History  Chief Complaint  Patient presents with   Abdominal Pain    Generalized abdominal pain  states, "like a charlie horse in my abdomen, right leg, left leg, right calf, bil feet"    Karen Stein is a 52 y.o. female.  The history is provided by the patient.  Patient with history of diabetes, hyperlipidemia, obesity, TIA presents with body cramps She reports over the past several hours she has had cramping throughout her extremities and her upper abdomen.  No fever/vomiting/diarrhea.  No chest pain or shortness of breath She has not had it previously. She does report recent increased urination from Jardiance that she might be dehydrated.  She does report recently starting an exercise routine No other new medications No new weakness    Home Medications Prior to Admission medications   Medication Sig Start Date End Date Taking? Authorizing Provider  rosuvastatin (CRESTOR) 40 MG tablet Take 40 mg by mouth daily. 08/01/22 08/01/23 Yes [provider]  albuterol (VENTOLIN HFA) 108 (90 Base) MCG/ACT inhaler Inhale 1-2 puffs into the lungs every 6 (six) hours as needed for wheezing or shortness of breath. 04/29/22   Gustavus Bryant, FNP  aspirin EC 81 MG tablet Take 1 tablet (81 mg total) by mouth daily. Swallow whole. 06/02/22 08/31/22  Lorin Glass, MD  ciclopirox (PENLAC) 8 % solution Apply 1 Application topically daily as needed (Skin infection). 01/13/21   [provider]  clopidogrel (PLAVIX) 75 MG tablet Take 1 tablet (75 mg total) by mouth daily. 06/02/22 08/31/22  Lorin Glass, MD  diclofenac Sodium (VOLTAREN) 1 % GEL Apply 4 g topically 4 (four) times daily. Patient not taking: Reported on 05/29/2022 02/26/22   Melene Plan, DO  Insulin Aspart, w/Niacinamide, (FIASP) 100 UNIT/ML SOLN Inject 5-16 Units into the skin with breakfast, with lunch,  and with evening meal. Per sliding scale 09/11/19   [provider]  JARDIANCE 10 MG TABS tablet Take 1 tablet by mouth every morning.    [provider]  lidocaine (LIDODERM) 5 % Place 1 patch onto the skin daily. Remove & Discard patch within 12 hours or as directed by MD Patient taking differently: Place 1 patch onto the skin daily as needed (For pain). 08/23/19   Palumbo, April, MD  ondansetron (ZOFRAN-ODT) 4 MG disintegrating tablet Take 1 tablet (4 mg total) by mouth every 8 (eight) hours as needed for nausea or vomiting. 03/28/22   Roxy Horseman, PA-C  Oxycodone HCl 10 MG TABS Take 10 mg by mouth every 6 (six) hours. 11/07/19   [provider]  pregabalin (LYRICA) 200 MG capsule Take 200 mg by mouth 3 (three) times daily. 01/27/21   [provider]  Semaglutide,0.25 or 0.5MG /DOS, (OZEMPIC, 0.25 OR 0.5 MG/DOSE,) 2 MG/1.5ML SOPN Inject 0.4 mLs into the skin once a week. 09/11/19   [provider]      Allergies    Amoxicillin, Amoxicillin-pot clavulanate, Macrobid [nitrofurantoin macrocrystal], Shellfish allergy, Sulfa antibiotics, and Nitrofurantoin    Review of Systems   Review of Systems  Constitutional:  Negative for fever.  Gastrointestinal:  Negative for diarrhea and vomiting.  Musculoskeletal:  Positive for myalgias.    Physical Exam Updated Vital Signs BP (!) 154/86   Pulse 74   Temp 98.2 F (36.8 C)   Resp 16   Ht 1.727 m ( )  Wt 108.4 kg   LMP 11/13/2015 (Exact Date)   SpO2 97%   BMI 36.34 kg/m  Physical Exam CONSTITUTIONAL: Well developed/well nourished HEAD: Normocephalic/atraumatic EYES: EOMI/PERRL ENMT: Mucous membranes moist NECK: supple no meningeal signs SPINE/BACK:entire spine nontender CV: S1/S2 noted, no murmurs/rubs/gallops noted LUNGS: Lungs are clear to auscultation bilaterally, no apparent distress ABDOMEN: soft, nontender, no rebound or guarding, bowel sounds noted throughout abdomen, obese GU:no  cva tenderness NEURO: Pt is awake/alert/appropriate, moves all extremitiesx4.  No facial droop.  No arm or leg drift, no facial droop EXTREMITIES: pulses normal/equal, full ROM, distal pulses equal and intact in all 4 extremities.  There is no edema or erythema noted extremities. SKIN: warm, color normal PSYCH: no abnormalities of mood noted, alert and oriented to situation  ED Results / Procedures / Treatments   Labs (all labs ordered are listed, but only abnormal results are displayed) Labs Reviewed  COMPREHENSIVE METABOLIC PANEL - Abnormal; Notable for the following components:      Result Value   Sodium 133 (*)    Glucose, Bld 294 (*)    Creatinine, Ser 1.03 (*)    AST 12 (*)    Alkaline Phosphatase 133 (*)    All other components within normal limits  URINALYSIS, ROUTINE W REFLEX MICROSCOPIC - Abnormal; Notable for the following components:   Glucose, UA 500 (*)    Hgb urine dipstick TRACE (*)    All other components within normal limits  CBC WITH DIFFERENTIAL/PLATELET  LIPASE, BLOOD  CK  MAGNESIUM    EKG EKG Interpretation  Date/Time:  Saturday August 19 2022 04:21:17 EDT Ventricular Rate:  80 PR Interval:  156 QRS Duration: 105 QT Interval:  391 QTC Calculation: 451 R Axis:   -39 Text Interpretation: Sinus rhythm Left ventricular hypertrophy No significant change since last tracing Confirmed by Zadie Rhine ([id number]) on 08/19/2022 4:46:21 AM  Radiology No results found.  Procedures Procedures    Medications Ordered in ED Medications  sodium chloride 0.9 % bolus 1,000 mL (0 mLs Intravenous Stopped 08/19/22 0530)  fentaNYL (SUBLIMAZE) injection 50 mcg (50 mcg Intravenous Given 08/19/22 0426)    ED Course/ Medical Decision Making/ A&P Clinical Course as of 08/19/22 0558  Sat Aug 19, 2022  0442 Glucose(!): 294 hyperglycemia [DW]  0442 BUN: 20 Mild dehydration  [DW]  0454 Patient presents for generalized abdominal cramping and cramping in her extremities.   She had no focal abdominal tenderness.  Low suspicion for acute abdominal emergency.  She does report recent increased urination likely due to her meds.  She also reports that she eats a lot of carbs as she is currently edentulous and is awaiting to have further dental care.  She understands that she needs to increase her fluid intake. [DW]  [id number] Patient continues to improve.  She is taking p.o. fluids, she is eating crackers.  She has no abdominal pain.  No vomiting.  She feels improved after drinking fluids IV fluids.  Patient is safe for discharge home [DW]    Clinical Course User Index [DW] Zadie Rhine, MD                             Medical Decision Making Amount and/or Complexity of Data Reviewed Labs: ordered. Decision-making details documented in ED Course. ECG/medicine tests: ordered.  Risk Prescription drug management.   Patient presents with myalgias and cramping.  Patient found to be dehydrated.  Patient has a complicated history including  TIA, diabetes. Patient was treated with IV fluids and IV pain medicine and is now feeling improved        Final Clinical Impression(s) / ED Diagnoses Final diagnoses:  Myalgia  Dehydration    Rx / DC Orders ED Discharge Orders     None         Zadie Rhine, MD 08/19/22 [phone]

## 2022-08-23 ENCOUNTER — Telehealth: Payer: Self-pay | Admitting: *Deleted

## 2022-08-23 NOTE — Telephone Encounter (Signed)
        Patient  visited [facility] on 08/19/2022  for TREATMENT    Telephone encounter attempt :  1st  A HIPAA compliant voice message was left requesting a return call.  Instructed patient to call back at [phone]. Yehuda Mao [facility] -[facility] [address] [facility], Population Health [phone] [address] [place] , [place] [address] Email : [email or web address]

## 2022-08-24 ENCOUNTER — Telehealth: Payer: Self-pay | Admitting: *Deleted

## 2022-08-24 NOTE — Telephone Encounter (Signed)
        Patient  visited [facility] on 08/19/2022  for treatment    Telephone encounter attempt :    A HIPAA compliant voice message was left requesting a return call.  Instructed patient to call back at [phone].  Yehuda Mao Greenauer -[facility], Population Health [phone] [address] [place] , [place] [address] Email : [email or web address]

## 2022-08-30 ENCOUNTER — Other Ambulatory Visit: Payer: Self-pay

## 2022-08-30 ENCOUNTER — Encounter (HOSPITAL_BASED_OUTPATIENT_CLINIC_OR_DEPARTMENT_OTHER): Payer: Self-pay | Admitting: Emergency Medicine

## 2022-08-30 ENCOUNTER — Emergency Department (HOSPITAL_BASED_OUTPATIENT_CLINIC_OR_DEPARTMENT_OTHER)
Admission: EM | Admit: 2022-08-30 | Discharge: 2022-08-30 | Disposition: A | Discharge status: Home / Self Care | Payer: Medicare PPO | Attending: Emergency Medicine | Admitting: Emergency Medicine

## 2022-08-30 DIAGNOSIS — M791 Myalgia, unspecified site: Secondary | ICD-10-CM

## 2022-08-30 DIAGNOSIS — M7918 Myalgia, other site: Secondary | ICD-10-CM | POA: Diagnosis present

## 2022-08-30 LAB — URINALYSIS, ROUTINE W REFLEX MICROSCOPIC
Bacteria, UA: NONE SEEN
Bilirubin Urine: NEGATIVE
Glucose, UA: 250 mg/dL — AB
Hgb urine dipstick: NEGATIVE
Ketones, ur: NEGATIVE mg/dL
Nitrite: NEGATIVE
Protein, ur: NEGATIVE mg/dL
Specific Gravity, Urine: 1.016 (ref 1.005–1.030)
pH: 5.5 (ref 5.0–8.0)

## 2022-08-30 LAB — CBC
HCT: 44.8 % (ref 36.0–46.0)
Hemoglobin: 14.8 g/dL (ref 12.0–15.0)
MCH: 28.7 pg (ref 26.0–34.0)
MCHC: 33 g/dL (ref 30.0–36.0)
MCV: 87 fL (ref 80.0–100.0)
Platelets: 247 10*3/uL (ref 150–400)
RBC: 5.15 MIL/uL — ABNORMAL HIGH (ref 3.87–5.11)
RDW: 12.7 % (ref 11.5–15.5)
WBC: 8.4 10*3/uL (ref 4.0–10.5)
nRBC: 0 % (ref 0.0–0.2)

## 2022-08-30 LAB — BASIC METABOLIC PANEL
Anion gap: 8 (ref 5–15)
BUN: 19 mg/dL (ref 6–20)
CO2: 25 mmol/L (ref 22–32)
Calcium: 9 mg/dL (ref 8.9–10.3)
Chloride: 103 mmol/L (ref 98–111)
Creatinine, Ser: 0.97 mg/dL (ref 0.44–1.00)
GFR, Estimated: >60 mL/min (ref >60)
Glucose, Bld: 296 mg/dL — ABNORMAL HIGH (ref 70–99)
Potassium: 5 mmol/L (ref 3.5–5.1)
Sodium: 136 mmol/L (ref 135–145)

## 2022-08-30 LAB — CK: Total CK: 103 U/L (ref 38–234)

## 2022-08-30 NOTE — Discharge Instructions (Signed)
1.  Talk to your endocrinologist and your primary care doctor about medications that might be causing muscle pain. 2.  Your pain might be due to to your fibromyalgia. 3.  Return if you get a fever, swelling that you can see, rashes or other concerning changes.

## 2022-08-30 NOTE — ED Provider Notes (Signed)
[place] EMERGENCY DEPARTMENT AT [facility] Provider Note   CSN: [id number] Arrival date & time: 08/30/22  5732     History  Chief Complaint  Patient presents with   Fatigue   Muscle Pain    Karen Stein is a 52 y.o. female.  HPI Patient reports she has been getting aching of her legs and back for about a month.  Seems to be in about the same timeframe since she started jardiance.  She reports she gets spasms and cramps in her feet and hands sometimes.  She is also concerned because she tried to apply to a cholesterol medication study and was told that her kidney function was not normal.  No fever no chills.  No chest pain no shortness of breath.    Home Medications Prior to Admission medications   Medication Sig Start Date End Date Taking? Authorizing Provider  atorvastatin (LIPITOR) 40 MG tablet Take 40 mg by mouth daily. 08/26/22  Yes [provider]  albuterol (VENTOLIN HFA) 108 (90 Base) MCG/ACT inhaler Inhale 1-2 puffs into the lungs every 6 (six) hours as needed for wheezing or shortness of breath. 04/29/22   Gustavus Bryant, FNP  aspirin EC 81 MG tablet Take 1 tablet (81 mg total) by mouth daily. Swallow whole. 06/02/22 08/31/22  Lorin Glass, MD  ciclopirox (PENLAC) 8 % solution Apply 1 Application topically daily as needed (Skin infection). 01/13/21   [provider]  clopidogrel (PLAVIX) 75 MG tablet Take 1 tablet (75 mg total) by mouth daily. 06/02/22 08/31/22  Lorin Glass, MD  Continuous Glucose Transmitter (DEXCOM G6 TRANSMITTER) MISC USE AS DIRECTED EVERY 3 MONTHS    [provider]  diclofenac Sodium (VOLTAREN) 1 % GEL Apply 4 g topically 4 (four) times daily. Patient not taking: Reported on 05/29/2022 02/26/22   Melene Plan, DO  Insulin Aspart, w/Niacinamide, (FIASP) 100 UNIT/ML SOLN Inject 5-16 Units into the skin with breakfast, with lunch, and with evening meal. Per sliding scale 09/11/19   [provider]  JARDIANCE 10 MG  TABS tablet Take 1 tablet by mouth every morning.    [provider]  lidocaine (LIDODERM) 5 % Place 1 patch onto the skin daily. Remove & Discard patch within 12 hours or as directed by MD Patient taking differently: Place 1 patch onto the skin daily as needed (For pain). 08/23/19   Palumbo, April, MD  ondansetron (ZOFRAN-ODT) 4 MG disintegrating tablet Take 1 tablet (4 mg total) by mouth every 8 (eight) hours as needed for nausea or vomiting. 03/28/22   Roxy Horseman, PA-C  Oxycodone HCl 10 MG TABS Take 10 mg by mouth every 6 (six) hours. 11/07/19   [provider]  pregabalin (LYRICA) 200 MG capsule Take 200 mg by mouth 3 (three) times daily. 01/27/21   [provider]  rosuvastatin (CRESTOR) 40 MG tablet Take 40 mg by mouth daily. 08/01/22 08/01/23  [provider]  Semaglutide,0.25 or 0.5MG /DOS, (OZEMPIC, 0.25 OR 0.5 MG/DOSE,) 2 MG/1.5ML SOPN Inject 0.4 mLs into the skin once a week. 09/11/19   [provider]  tiZANidine (ZANAFLEX) 4 MG tablet Take 4 mg by mouth every 8 (eight) hours as needed.    [provider]  valACYclovir (VALTREX) 500 MG tablet Take 500 mg by mouth daily.    [provider]      Allergies    Amoxicillin, Amoxicillin-pot clavulanate, Macrobid [nitrofurantoin macrocrystal], Shellfish allergy, Sulfa antibiotics, and Nitrofurantoin    Review of Systems  Review of Systems  Physical Exam Updated Vital Signs BP 124/69 (BP Location: Left Arm)   Pulse 85   Temp 98.1 F (36.7 C)   Resp 18   Wt 114.8 kg   LMP 11/13/2015 (Exact Date)   SpO2 100%   BMI 38.47 kg/m  Physical Exam Constitutional:      Comments: Alert nontoxic clinically well in appearance.  HENT:     Mouth/Throat:     Pharynx: Oropharynx is clear.  Eyes:     Extraocular Movements: Extraocular movements intact.  Cardiovascular:     Rate and Rhythm: Normal rate and regular rhythm.  Pulmonary:     Effort: Pulmonary effort is normal.      Breath sounds: Normal breath sounds.  Abdominal:     General: There is no distension.     Palpations: Abdomen is soft.     Tenderness: There is no abdominal tenderness. There is no guarding.  Musculoskeletal:        General: No swelling or tenderness. Normal range of motion.     Right lower leg: No edema.     Left lower leg: No edema.     Comments: No peripheral edema.  No swelling erythema or effusions at joints.  Feet are warm and dry.  Dorsalis pedis pulses are 2+ and symmetric.  Skin:    General: Skin is warm and dry.  Neurological:     General: No focal deficit present.     Mental Status: She is oriented to person, place, and time.     Coordination: Coordination normal.  Psychiatric:        Mood and Affect: Mood normal.     ED Results / Procedures / Treatments   Labs (all labs ordered are listed, but only abnormal results are displayed) Labs Reviewed  URINALYSIS, ROUTINE W REFLEX MICROSCOPIC - Abnormal; Notable for the following components:      Result Value   Glucose, UA 250 (*)    Leukocytes,Ua MODERATE (*)    All other components within normal limits  CBC - Abnormal; Notable for the following components:   RBC 5.15 (*)    All other components within normal limits  BASIC METABOLIC PANEL - Abnormal; Notable for the following components:   Glucose, Bld 296 (*)    All other components within normal limits    EKG None  Radiology No results found.  Procedures Procedures    Medications Ordered in ED Medications - No data to display  ED Course/ Medical Decision Making/ A&P                             Medical Decision Making Amount and/or Complexity of Data Reviewed Labs: ordered.   Patient is clinically well in appearance.  She has had about a months worth of cramping and spasmodic pain of her legs feet and lower back.  She describes sometimes getting what sounds like carpopedal spasm.  Will check basic lab work for possible electrolyte  derangement\anemia\myositis.  CK normal, electrolyte panel without derangement, CBC normal without leukocytosis or anemia.  Patient's symptoms are fairly diffuse and have been for a number of months.  At this time she is clinically well in appearance and does not have any electrolyte imbalance.  CK normal ruling out probable myositis.  CBC normal, less likely to be infectious or secondary to anemia.  Discussed possible medication side effect but at this time I do not feel that I should  change patient's medications and recommend she discuss this with both her endocrinologist and her PCP.  Patient also has history fibromyalgia may be contributory.  However at this time with patient stable and otherwise well in appearance, safe for discharge with close follow-up.        Final Clinical Impression(s) / ED Diagnoses Final diagnoses:  Myalgia    Rx / DC Orders ED Discharge Orders     None         Arby Barrette, MD 09/11/22 2001

## 2022-08-30 NOTE — ED Triage Notes (Signed)
Pt arrives pov, to triage in wheelchair, c/o weakness and "dehydration". Pt c/o generalized  cramping x 2 days. Pt involved in chol. Study ,was informed that has AKI. Pt endorses LT lower back pain. Pt tearful in triage.

## 2022-08-30 NOTE — ED Notes (Signed)
Dc instructions reviewed with patient. Patient voiced understanding. Dc with belongings.  °

## 2022-10-10 DIAGNOSIS — M7912 Myalgia of auxiliary muscles, head and neck: Secondary | ICD-10-CM | POA: Diagnosis not present

## 2022-10-10 DIAGNOSIS — Z760 Encounter for issue of repeat prescription: Secondary | ICD-10-CM | POA: Diagnosis not present

## 2022-10-10 DIAGNOSIS — Z79899 Other long term (current) drug therapy: Secondary | ICD-10-CM | POA: Diagnosis not present

## 2022-10-10 DIAGNOSIS — M7918 Myalgia, other site: Secondary | ICD-10-CM | POA: Diagnosis not present

## 2022-10-26 DIAGNOSIS — F418 Other specified anxiety disorders: Secondary | ICD-10-CM | POA: Diagnosis not present

## 2022-10-26 DIAGNOSIS — G8929 Other chronic pain: Secondary | ICD-10-CM | POA: Diagnosis not present

## 2022-10-26 DIAGNOSIS — M797 Fibromyalgia: Secondary | ICD-10-CM | POA: Diagnosis not present

## 2022-10-26 DIAGNOSIS — K3184 Gastroparesis: Secondary | ICD-10-CM | POA: Diagnosis not present

## 2022-10-26 DIAGNOSIS — Z794 Long term (current) use of insulin: Secondary | ICD-10-CM | POA: Diagnosis not present

## 2022-10-26 DIAGNOSIS — E119 Type 2 diabetes mellitus without complications: Secondary | ICD-10-CM | POA: Diagnosis not present

## 2022-10-26 DIAGNOSIS — I6523 Occlusion and stenosis of bilateral carotid arteries: Secondary | ICD-10-CM | POA: Diagnosis not present

## 2022-10-26 DIAGNOSIS — M5136 Other intervertebral disc degeneration, lumbar region: Secondary | ICD-10-CM | POA: Diagnosis not present

## 2022-10-26 DIAGNOSIS — E785 Hyperlipidemia, unspecified: Secondary | ICD-10-CM | POA: Diagnosis not present

## 2022-10-26 DIAGNOSIS — B009 Herpesviral infection, unspecified: Secondary | ICD-10-CM | POA: Diagnosis not present

## 2022-11-15 DIAGNOSIS — Z79899 Other long term (current) drug therapy: Secondary | ICD-10-CM | POA: Diagnosis not present

## 2022-11-15 DIAGNOSIS — M7912 Myalgia of auxiliary muscles, head and neck: Secondary | ICD-10-CM | POA: Diagnosis not present

## 2022-11-15 DIAGNOSIS — M7918 Myalgia, other site: Secondary | ICD-10-CM | POA: Diagnosis not present

## 2022-11-15 DIAGNOSIS — Z760 Encounter for issue of repeat prescription: Secondary | ICD-10-CM | POA: Diagnosis not present

## 2022-11-29 DIAGNOSIS — Z9189 Other specified personal risk factors, not elsewhere classified: Secondary | ICD-10-CM | POA: Diagnosis not present

## 2022-11-29 DIAGNOSIS — N6315 Unspecified lump in the right breast, overlapping quadrants: Secondary | ICD-10-CM | POA: Diagnosis not present

## 2022-11-29 DIAGNOSIS — N6325 Unspecified lump in the left breast, overlapping quadrants: Secondary | ICD-10-CM | POA: Diagnosis not present

## 2022-12-04 DIAGNOSIS — E1165 Type 2 diabetes mellitus with hyperglycemia: Secondary | ICD-10-CM | POA: Diagnosis not present

## 2022-12-13 DIAGNOSIS — E119 Type 2 diabetes mellitus without complications: Secondary | ICD-10-CM | POA: Diagnosis not present

## 2022-12-13 DIAGNOSIS — G8929 Other chronic pain: Secondary | ICD-10-CM | POA: Diagnosis not present

## 2022-12-13 DIAGNOSIS — K3184 Gastroparesis: Secondary | ICD-10-CM | POA: Diagnosis not present

## 2022-12-13 DIAGNOSIS — M5136 Other intervertebral disc degeneration, lumbar region: Secondary | ICD-10-CM | POA: Diagnosis not present

## 2022-12-13 DIAGNOSIS — F418 Other specified anxiety disorders: Secondary | ICD-10-CM | POA: Diagnosis not present

## 2022-12-13 DIAGNOSIS — I6523 Occlusion and stenosis of bilateral carotid arteries: Secondary | ICD-10-CM | POA: Diagnosis not present

## 2022-12-13 DIAGNOSIS — E785 Hyperlipidemia, unspecified: Secondary | ICD-10-CM | POA: Diagnosis not present

## 2022-12-13 DIAGNOSIS — Z794 Long term (current) use of insulin: Secondary | ICD-10-CM | POA: Diagnosis not present

## 2022-12-13 DIAGNOSIS — B009 Herpesviral infection, unspecified: Secondary | ICD-10-CM | POA: Diagnosis not present

## 2022-12-13 DIAGNOSIS — M797 Fibromyalgia: Secondary | ICD-10-CM | POA: Diagnosis not present

## 2022-12-13 DIAGNOSIS — H6122 Impacted cerumen, left ear: Secondary | ICD-10-CM | POA: Diagnosis not present

## 2022-12-19 DIAGNOSIS — N898 Other specified noninflammatory disorders of vagina: Secondary | ICD-10-CM | POA: Diagnosis not present

## 2022-12-19 DIAGNOSIS — Z6836 Body mass index (BMI) 36.0-36.9, adult: Secondary | ICD-10-CM | POA: Diagnosis not present

## 2022-12-19 DIAGNOSIS — F319 Bipolar disorder, unspecified: Secondary | ICD-10-CM | POA: Diagnosis not present

## 2022-12-19 DIAGNOSIS — Z01419 Encounter for gynecological examination (general) (routine) without abnormal findings: Secondary | ICD-10-CM | POA: Diagnosis not present

## 2022-12-19 DIAGNOSIS — Z9189 Other specified personal risk factors, not elsewhere classified: Secondary | ICD-10-CM | POA: Diagnosis not present

## 2022-12-19 DIAGNOSIS — R928 Other abnormal and inconclusive findings on diagnostic imaging of breast: Secondary | ICD-10-CM | POA: Diagnosis not present

## 2022-12-19 DIAGNOSIS — B009 Herpesviral infection, unspecified: Secondary | ICD-10-CM | POA: Diagnosis not present

## 2022-12-19 DIAGNOSIS — N760 Acute vaginitis: Secondary | ICD-10-CM | POA: Diagnosis not present

## 2022-12-21 DIAGNOSIS — Z79899 Other long term (current) drug therapy: Secondary | ICD-10-CM | POA: Diagnosis not present

## 2022-12-21 DIAGNOSIS — M7912 Myalgia of auxiliary muscles, head and neck: Secondary | ICD-10-CM | POA: Diagnosis not present

## 2022-12-21 DIAGNOSIS — Z111 Encounter for screening for respiratory tuberculosis: Secondary | ICD-10-CM | POA: Diagnosis not present

## 2022-12-21 DIAGNOSIS — Z760 Encounter for issue of repeat prescription: Secondary | ICD-10-CM | POA: Diagnosis not present

## 2022-12-21 DIAGNOSIS — M7918 Myalgia, other site: Secondary | ICD-10-CM | POA: Diagnosis not present

## 2022-12-23 DIAGNOSIS — Z111 Encounter for screening for respiratory tuberculosis: Secondary | ICD-10-CM | POA: Diagnosis not present

## 2023-01-13 DIAGNOSIS — M62838 Other muscle spasm: Secondary | ICD-10-CM | POA: Diagnosis not present

## 2023-01-13 DIAGNOSIS — R42 Dizziness and giddiness: Secondary | ICD-10-CM | POA: Diagnosis not present

## 2023-01-13 DIAGNOSIS — R200 Anesthesia of skin: Secondary | ICD-10-CM | POA: Diagnosis not present

## 2023-01-13 DIAGNOSIS — Z5986 Financial insecurity: Secondary | ICD-10-CM | POA: Diagnosis not present

## 2023-01-13 DIAGNOSIS — I651 Occlusion and stenosis of basilar artery: Secondary | ICD-10-CM | POA: Diagnosis not present

## 2023-01-16 DIAGNOSIS — E1169 Type 2 diabetes mellitus with other specified complication: Secondary | ICD-10-CM | POA: Diagnosis not present

## 2023-01-16 DIAGNOSIS — Z794 Long term (current) use of insulin: Secondary | ICD-10-CM | POA: Diagnosis not present

## 2023-01-16 DIAGNOSIS — E785 Hyperlipidemia, unspecified: Secondary | ICD-10-CM | POA: Diagnosis not present

## 2023-01-16 DIAGNOSIS — E1165 Type 2 diabetes mellitus with hyperglycemia: Secondary | ICD-10-CM | POA: Diagnosis not present

## 2023-01-16 DIAGNOSIS — Z7984 Long term (current) use of oral hypoglycemic drugs: Secondary | ICD-10-CM | POA: Diagnosis not present

## 2023-01-16 DIAGNOSIS — R110 Nausea: Secondary | ICD-10-CM | POA: Diagnosis not present

## 2023-01-23 DIAGNOSIS — G894 Chronic pain syndrome: Secondary | ICD-10-CM | POA: Diagnosis not present

## 2023-01-23 DIAGNOSIS — Z79899 Other long term (current) drug therapy: Secondary | ICD-10-CM | POA: Diagnosis not present

## 2023-01-23 DIAGNOSIS — M7912 Myalgia of auxiliary muscles, head and neck: Secondary | ICD-10-CM | POA: Diagnosis not present

## 2023-01-23 DIAGNOSIS — Z760 Encounter for issue of repeat prescription: Secondary | ICD-10-CM | POA: Diagnosis not present

## 2023-02-21 DIAGNOSIS — G894 Chronic pain syndrome: Secondary | ICD-10-CM | POA: Diagnosis not present

## 2023-02-21 DIAGNOSIS — M7918 Myalgia, other site: Secondary | ICD-10-CM | POA: Diagnosis not present

## 2023-02-21 DIAGNOSIS — M7912 Myalgia of auxiliary muscles, head and neck: Secondary | ICD-10-CM | POA: Diagnosis not present

## 2023-02-21 DIAGNOSIS — Z760 Encounter for issue of repeat prescription: Secondary | ICD-10-CM | POA: Diagnosis not present

## 2023-02-21 DIAGNOSIS — Z79899 Other long term (current) drug therapy: Secondary | ICD-10-CM | POA: Diagnosis not present

## 2023-02-23 DIAGNOSIS — I6930 Unspecified sequelae of cerebral infarction: Secondary | ICD-10-CM | POA: Diagnosis not present

## 2023-02-23 DIAGNOSIS — E1165 Type 2 diabetes mellitus with hyperglycemia: Secondary | ICD-10-CM | POA: Diagnosis not present

## 2023-02-23 DIAGNOSIS — I69398 Other sequelae of cerebral infarction: Secondary | ICD-10-CM | POA: Diagnosis not present

## 2023-02-23 DIAGNOSIS — R5383 Other fatigue: Secondary | ICD-10-CM | POA: Diagnosis not present

## 2023-02-23 DIAGNOSIS — E785 Hyperlipidemia, unspecified: Secondary | ICD-10-CM | POA: Diagnosis not present

## 2023-02-23 DIAGNOSIS — I651 Occlusion and stenosis of basilar artery: Secondary | ICD-10-CM | POA: Diagnosis not present

## 2023-02-23 DIAGNOSIS — Z794 Long term (current) use of insulin: Secondary | ICD-10-CM | POA: Diagnosis not present

## 2023-03-06 DIAGNOSIS — E1165 Type 2 diabetes mellitus with hyperglycemia: Secondary | ICD-10-CM | POA: Diagnosis not present

## 2023-03-12 DIAGNOSIS — Z794 Long term (current) use of insulin: Secondary | ICD-10-CM | POA: Diagnosis not present

## 2023-03-12 DIAGNOSIS — E1165 Type 2 diabetes mellitus with hyperglycemia: Secondary | ICD-10-CM | POA: Diagnosis not present

## 2023-03-21 DIAGNOSIS — M7918 Myalgia, other site: Secondary | ICD-10-CM | POA: Diagnosis not present

## 2023-03-21 DIAGNOSIS — Z79899 Other long term (current) drug therapy: Secondary | ICD-10-CM | POA: Diagnosis not present

## 2023-03-21 DIAGNOSIS — M7912 Myalgia of auxiliary muscles, head and neck: Secondary | ICD-10-CM | POA: Diagnosis not present

## 2023-03-21 DIAGNOSIS — Z760 Encounter for issue of repeat prescription: Secondary | ICD-10-CM | POA: Diagnosis not present

## 2023-04-18 DIAGNOSIS — M7912 Myalgia of auxiliary muscles, head and neck: Secondary | ICD-10-CM | POA: Diagnosis not present

## 2023-04-18 DIAGNOSIS — Z79899 Other long term (current) drug therapy: Secondary | ICD-10-CM | POA: Diagnosis not present

## 2023-04-18 DIAGNOSIS — Z760 Encounter for issue of repeat prescription: Secondary | ICD-10-CM | POA: Diagnosis not present

## 2023-04-18 DIAGNOSIS — M7918 Myalgia, other site: Secondary | ICD-10-CM | POA: Diagnosis not present

## 2023-04-24 DIAGNOSIS — Z131 Encounter for screening for diabetes mellitus: Secondary | ICD-10-CM | POA: Diagnosis not present

## 2023-04-24 DIAGNOSIS — N760 Acute vaginitis: Secondary | ICD-10-CM | POA: Diagnosis not present

## 2023-04-24 DIAGNOSIS — Z113 Encounter for screening for infections with a predominantly sexual mode of transmission: Secondary | ICD-10-CM | POA: Diagnosis not present

## 2023-05-16 DIAGNOSIS — Z79899 Other long term (current) drug therapy: Secondary | ICD-10-CM | POA: Diagnosis not present

## 2023-05-17 ENCOUNTER — Other Ambulatory Visit: Payer: Self-pay

## 2023-05-17 ENCOUNTER — Emergency Department (HOSPITAL_BASED_OUTPATIENT_CLINIC_OR_DEPARTMENT_OTHER)
Admission: EM | Admit: 2023-05-17 | Discharge: 2023-05-17 | Disposition: A | Discharge status: Home / Self Care | Payer: Medicare Other | Attending: Emergency Medicine | Admitting: Emergency Medicine

## 2023-05-17 ENCOUNTER — Other Ambulatory Visit (HOSPITAL_BASED_OUTPATIENT_CLINIC_OR_DEPARTMENT_OTHER): Payer: Self-pay

## 2023-05-17 ENCOUNTER — Encounter (HOSPITAL_BASED_OUTPATIENT_CLINIC_OR_DEPARTMENT_OTHER): Payer: Self-pay | Admitting: Emergency Medicine

## 2023-05-17 DIAGNOSIS — Z79899 Other long term (current) drug therapy: Secondary | ICD-10-CM | POA: Insufficient documentation

## 2023-05-17 DIAGNOSIS — R350 Frequency of micturition: Secondary | ICD-10-CM | POA: Diagnosis not present

## 2023-05-17 DIAGNOSIS — Z7902 Long term (current) use of antithrombotics/antiplatelets: Secondary | ICD-10-CM | POA: Insufficient documentation

## 2023-05-17 DIAGNOSIS — Z794 Long term (current) use of insulin: Secondary | ICD-10-CM | POA: Insufficient documentation

## 2023-05-17 DIAGNOSIS — W009XXA Unspecified fall due to ice and snow, initial encounter: Secondary | ICD-10-CM | POA: Insufficient documentation

## 2023-05-17 DIAGNOSIS — S39012A Strain of muscle, fascia and tendon of lower back, initial encounter: Secondary | ICD-10-CM | POA: Insufficient documentation

## 2023-05-17 DIAGNOSIS — E1165 Type 2 diabetes mellitus with hyperglycemia: Secondary | ICD-10-CM | POA: Diagnosis not present

## 2023-05-17 DIAGNOSIS — R739 Hyperglycemia, unspecified: Secondary | ICD-10-CM

## 2023-05-17 DIAGNOSIS — S3992XA Unspecified injury of lower back, initial encounter: Secondary | ICD-10-CM | POA: Diagnosis present

## 2023-05-17 LAB — BASIC METABOLIC PANEL
Anion gap: 10 (ref 5–15)
BUN: 12 mg/dL (ref 6–20)
CO2: 26 mmol/L (ref 22–32)
Calcium: 8.5 mg/dL — ABNORMAL LOW (ref 8.9–10.3)
Chloride: 100 mmol/L (ref 98–111)
Creatinine, Ser: 1 mg/dL (ref 0.44–1.00)
GFR, Estimated: >60 mL/min (ref >60)
Glucose, Bld: 508 mg/dL (ref 70–99)
Potassium: 4.1 mmol/L (ref 3.5–5.1)
Sodium: 136 mmol/L (ref 135–145)

## 2023-05-17 LAB — CBC
HCT: 41.4 % (ref 36.0–46.0)
Hemoglobin: 14.2 g/dL (ref 12.0–15.0)
MCH: 29.6 pg (ref 26.0–34.0)
MCHC: 34.3 g/dL (ref 30.0–36.0)
MCV: 86.4 fL (ref 80.0–100.0)
Platelets: 219 10*3/uL (ref 150–400)
RBC: 4.79 MIL/uL (ref 3.87–5.11)
RDW: 12.7 % (ref 11.5–15.5)
WBC: 7.3 10*3/uL (ref 4.0–10.5)
nRBC: 0 % (ref 0.0–0.2)

## 2023-05-17 LAB — URINALYSIS, ROUTINE W REFLEX MICROSCOPIC
Bacteria, UA: NONE SEEN
Bilirubin Urine: NEGATIVE
Glucose, UA: >1000 mg/dL — AB
Hgb urine dipstick: NEGATIVE
Ketones, ur: NEGATIVE mg/dL
Nitrite: NEGATIVE
Protein, ur: NEGATIVE mg/dL
Specific Gravity, Urine: 1.021 (ref 1.005–1.030)
pH: 5.5 (ref 5.0–8.0)

## 2023-05-17 LAB — CBG MONITORING, ED
Glucose-Capillary: 505 mg/dL (ref 70–99)
Glucose-Capillary: 357 mg/dL — ABNORMAL HIGH (ref 70–99)

## 2023-05-17 MED ORDER — METHOCARBAMOL 500 MG PO TABS
500.0000 mg | ORAL_TABLET | Freq: Two times a day (BID) | ORAL | 0 refills | Status: AC
  Filled 2023-05-17: qty 20, 10d supply, fill #0

## 2023-05-17 MED ORDER — INSULIN ASPART 100 UNIT/ML IJ SOLN
5.0000 [IU] | Freq: Three times a day (TID) | INTRAMUSCULAR | 1 refills | Status: AC
  Filled 2023-05-17: qty 10, 21d supply, fill #0

## 2023-05-17 MED ORDER — DICLOFENAC SODIUM 1 % EX GEL
4.0000 g | Freq: Four times a day (QID) | CUTANEOUS | 0 refills | Status: AC | PRN
  Filled 2023-05-17: qty 100, 7d supply, fill #0

## 2023-05-17 MED ORDER — KETOROLAC TROMETHAMINE 15 MG/ML IJ SOLN
15.0000 mg | Freq: Once | INTRAMUSCULAR | Status: AC
  Administered 2023-05-17: 15 mg via INTRAVENOUS
  Filled 2023-05-17: qty 1

## 2023-05-17 MED ORDER — INSULIN ASPART 100 UNIT/ML IJ SOLN
10.0000 [IU] | Freq: Once | INTRAMUSCULAR | Status: AC
  Administered 2023-05-17: 10 [IU] via SUBCUTANEOUS

## 2023-05-17 MED ORDER — INSULIN REGULAR HUMAN 100 UNIT/ML IJ SOLN: 10.0000 [IU] | Freq: Once | INTRAMUSCULAR | Status: DC

## 2023-05-17 MED ORDER — METHOCARBAMOL 500 MG PO TABS
500.0000 mg | ORAL_TABLET | Freq: Once | ORAL | Status: AC
  Administered 2023-05-17: 500 mg via ORAL
  Filled 2023-05-17: qty 1

## 2023-05-17 MED ORDER — SODIUM CHLORIDE 0.9 % IV BOLUS
1000.0000 mL | Freq: Once | INTRAVENOUS | Status: AC
  Administered 2023-05-17: 1000 mL via INTRAVENOUS

## 2023-05-17 NOTE — Discharge Instructions (Signed)
Take the medications as needed for your back pain.  Your blood sugar improved with treatment in the ED.  I did write for a refill of your short acting insulin.  Follow-up with your primary care doctor to be rechecked

## 2023-05-17 NOTE — ED Notes (Signed)
 RN reviewed discharge instructions with pt. Pt verbalized understanding and had no further questions. VSS upon discharge.  

## 2023-05-17 NOTE — ED Provider Notes (Signed)
[place] EMERGENCY DEPARTMENT AT [facility] Provider Note   CSN: [id number] Arrival date & time: 05/17/23  0865     History  Chief Complaint  Patient presents with   Hyperglycemia    Karen Stein is a 53 y.o. female.   Hyperglycemia  Patient has a history of arthritis degenerative disc disease, anxiety, endometriosis, fibromyalgia, diabetes.  Pt slipped on the ice the other day and since that time she has been having pain in her back.  Patient states she did not fall but twisted sharply.  She feels like this is triggered her fibromyalgia.  She feels like her muscles are tight in her back.  Pt has history of diabetes, she has not been able to take her prescribed medications for diabetes.  Pt ran out of her fiasp.   Pt has also been urinating frequently.  No known fevers or chills.  Home Medications Prior to Admission medications   Medication Sig Start Date End Date Taking? Authorizing Provider  methocarbamol (ROBAXIN) 500 MG tablet Take 1 tablet (500 mg total) by mouth 2 (two) times daily. 05/17/23  Yes Linwood Dibbles, MD  albuterol (VENTOLIN HFA) 108 (90 Base) MCG/ACT inhaler Inhale 1-2 puffs into the lungs every 6 (six) hours as needed for wheezing or shortness of breath. 04/29/22   Gustavus Bryant, FNP  atorvastatin (LIPITOR) 40 MG tablet Take 40 mg by mouth daily. 08/26/22   [provider]  ciclopirox (PENLAC) 8 % solution Apply 1 Application topically daily as needed (Skin infection). 01/13/21   [provider]  clopidogrel (PLAVIX) 75 MG tablet Take 1 tablet (75 mg total) by mouth daily. 06/02/22 08/31/22  Lorin Glass, MD  Continuous Glucose Transmitter (DEXCOM G6 TRANSMITTER) MISC USE AS DIRECTED EVERY 3 MONTHS    [provider]  diclofenac Sodium (VOLTAREN) 1 % GEL Apply 4 g topically 4 (four) times daily as needed (back pain). 05/17/23   Linwood Dibbles, MD  Insulin Aspart, w/Niacinamide, (FIASP) 100 UNIT/ML SOLN Inject 5-16 Units into the skin  with breakfast, with lunch, and with evening meal. Per sliding scale 05/17/23   Linwood Dibbles, MD  JARDIANCE 10 MG TABS tablet Take 1 tablet by mouth every morning.    [provider]  lidocaine (LIDODERM) 5 % Place 1 patch onto the skin daily. Remove & Discard patch within 12 hours or as directed by MD Patient taking differently: Place 1 patch onto the skin daily as needed (For pain). 08/23/19   Palumbo, April, MD  ondansetron (ZOFRAN-ODT) 4 MG disintegrating tablet Take 1 tablet (4 mg total) by mouth every 8 (eight) hours as needed for nausea or vomiting. 03/28/22   Roxy Horseman, PA-C  Oxycodone HCl 10 MG TABS Take 10 mg by mouth every 6 (six) hours. 11/07/19   [provider]  pregabalin (LYRICA) 200 MG capsule Take 200 mg by mouth 3 (three) times daily. 01/27/21   [provider]  rosuvastatin (CRESTOR) 40 MG tablet Take 40 mg by mouth daily. 08/01/22 08/01/23  [provider]  Semaglutide,0.25 or 0.5MG /DOS, (OZEMPIC, 0.25 OR 0.5 MG/DOSE,) 2 MG/1.5ML SOPN Inject 0.4 mLs into the skin once a week. 09/11/19   [provider]  tiZANidine (ZANAFLEX) 4 MG tablet Take 4 mg by mouth every 8 (eight) hours as needed.    [provider]  valACYclovir (VALTREX) 500 MG tablet Take 500 mg by mouth daily.    [provider]      Allergies    Amoxicillin, Amoxicillin-pot clavulanate,  Macrobid [nitrofurantoin macrocrystal], Shellfish allergy, Sulfa antibiotics, and Nitrofurantoin    Review of Systems   Review of Systems  Physical Exam Updated Vital Signs BP (!) 155/79 (BP Location: Left Arm)   Pulse 79   Temp 98.7 F (37.1 C) (Oral)   Resp 18   Ht 1.727 m (5\' 8" )   Wt 106.6 kg   LMP 11/13/2015 (Exact Date)   SpO2 98%   BMI 35.73 kg/m  Physical Exam Vitals and nursing note reviewed.  Constitutional:      General: She is not in acute distress.    Appearance: She is well-developed.  HENT:     Head: Normocephalic and atraumatic.     Right  Ear: External ear normal.     Left Ear: External ear normal.  Eyes:     General: No scleral icterus.       Right eye: No discharge.        Left eye: No discharge.     Conjunctiva/sclera: Conjunctivae normal.  Neck:     Trachea: No tracheal deviation.  Cardiovascular:     Rate and Rhythm: Normal rate and regular rhythm.  Pulmonary:     Effort: Pulmonary effort is normal. No respiratory distress.     Breath sounds: Normal breath sounds. No stridor. No wheezing or rales.  Abdominal:     General: Bowel sounds are normal. There is no distension.     Palpations: Abdomen is soft.     Tenderness: There is no abdominal tenderness. There is no guarding or rebound.  Musculoskeletal:        General: Tenderness present. No deformity.     Cervical back: Neck supple.     Comments: Tenderness to palpation paraspinal region lower back  Skin:    General: Skin is warm and dry.     Findings: No rash.  Neurological:     General: No focal deficit present.     Mental Status: She is alert.     Cranial Nerves: No cranial nerve deficit, dysarthria or facial asymmetry.     Sensory: No sensory deficit.     Motor: No abnormal muscle tone or seizure activity.     Coordination: Coordination normal.  Psychiatric:        Mood and Affect: Mood normal.     ED Results / Procedures / Treatments   Labs (all labs ordered are listed, but only abnormal results are displayed) Labs Reviewed  BASIC METABOLIC PANEL - Abnormal; Notable for the following components:      Result Value   Glucose, Bld 508 (*)    Calcium 8.5 (*)    All other components within normal limits  URINALYSIS, ROUTINE W REFLEX MICROSCOPIC - Abnormal; Notable for the following components:   Glucose, UA >1,000 (*)    Leukocytes,Ua MODERATE (*)    All other components within normal limits  CBG MONITORING, ED - Abnormal; Notable for the following components:   Glucose-Capillary 505 (*)    All other components within normal limits  CBG  MONITORING, ED - Abnormal; Notable for the following components:   Glucose-Capillary 357 (*)    All other components within normal limits  CBC  CBG MONITORING, ED    EKG None  Radiology No results found.  Procedures Procedures    Medications Ordered in ED Medications  sodium chloride 0.9 % bolus 1,000 mL (0 mLs Intravenous Stopped 05/17/23 1232)  ketorolac (TORADOL) 15 MG/ML injection 15 mg (15 mg Intravenous Given 05/17/23 1133)  methocarbamol (ROBAXIN) tablet  500 mg (500 mg Oral Given 05/17/23 1133)  insulin aspart (novoLOG) injection 10 Units (10 Units Subcutaneous Given 05/17/23 1231)    ED Course/ Medical Decision Making/ A&P Clinical Course as of 05/17/23 1457  Thu May 17, 2023  1221 Metabolic panel shows normal anion gap with hyperglycemia. [JK]  1430 Blood sugar improving down to 357 [JK]    Clinical Course User Index [JK] Linwood Dibbles, MD                                 Medical Decision Making Problems Addressed: Hyperglycemia: acute illness or injury that poses a threat to life or bodily functions Lumbar strain, initial encounter: acute illness or injury that poses a threat to life or bodily functions  Amount and/or Complexity of Data Reviewed Labs: ordered.  Risk Prescription drug management.   Patient presented to the ED for evaluation of hyperglycemia as well as back pain.  Patient states she twisted and started having pain in her back.  Patient does have history of fibromyalgia.  It is possible this may have exacerbated her symptoms.  Symptoms suggestive of back strain.  No findings to suggest infection.  No acute neurologic dysfunction.  Urinalysis does not suggest infection.  Suspect her frequency was related to her hyperglycemia.  Will send off a urine culture  Patient also noted hyperglycemia today.  She has run out of her short acting insulin.  Patient given IV fluids and insulin in the ED.  Blood sugars improved.  No findings to suggest  DKA.  Evaluation and diagnostic testing in the emergency department does not suggest an emergent condition requiring admission or immediate intervention beyond what has been performed at this time.  The patient is safe for discharge and has been instructed to return immediately for worsening symptoms, change in symptoms or any other concerns.         Final Clinical Impression(s) / ED Diagnoses Final diagnoses:  Hyperglycemia  Lumbar strain, initial encounter    Rx / DC Orders ED Discharge Orders          Ordered    diclofenac Sodium (VOLTAREN) 1 % GEL  4 times daily PRN        05/17/23 1457    methocarbamol (ROBAXIN) 500 MG tablet  2 times daily        05/17/23 1457    Insulin Aspart, w/Niacinamide, (FIASP) 100 UNIT/ML SOLN  3 times daily with meals       Note to Pharmacy: May substitute with appropriate formulary equivalent   05/17/23 1457              Linwood Dibbles, MD 05/17/23 1500

## 2023-05-17 NOTE — ED Triage Notes (Signed)
Reports unable to get insulin recently. Last dose was Tuesday. Glucose 505 in triage. Reports n/v. Also endorses back pain from recent fall. Denies hitting head.

## 2023-05-18 LAB — URINE CULTURE

## 2023-05-22 DIAGNOSIS — Z794 Long term (current) use of insulin: Secondary | ICD-10-CM | POA: Diagnosis not present

## 2023-05-22 DIAGNOSIS — E785 Hyperlipidemia, unspecified: Secondary | ICD-10-CM | POA: Diagnosis not present

## 2023-05-22 DIAGNOSIS — B379 Candidiasis, unspecified: Secondary | ICD-10-CM | POA: Diagnosis not present

## 2023-05-22 DIAGNOSIS — E1165 Type 2 diabetes mellitus with hyperglycemia: Secondary | ICD-10-CM | POA: Diagnosis not present

## 2023-05-22 DIAGNOSIS — E1169 Type 2 diabetes mellitus with other specified complication: Secondary | ICD-10-CM | POA: Diagnosis not present

## 2023-05-28 ENCOUNTER — Other Ambulatory Visit (HOSPITAL_BASED_OUTPATIENT_CLINIC_OR_DEPARTMENT_OTHER): Payer: Self-pay

## 2023-06-13 DIAGNOSIS — Z79899 Other long term (current) drug therapy: Secondary | ICD-10-CM | POA: Diagnosis not present

## 2023-07-13 DIAGNOSIS — Z8673 Personal history of transient ischemic attack (TIA), and cerebral infarction without residual deficits: Secondary | ICD-10-CM | POA: Diagnosis not present

## 2023-07-13 DIAGNOSIS — E785 Hyperlipidemia, unspecified: Secondary | ICD-10-CM | POA: Diagnosis not present

## 2023-07-13 DIAGNOSIS — Z20822 Contact with and (suspected) exposure to covid-19: Secondary | ICD-10-CM | POA: Diagnosis not present

## 2023-07-13 DIAGNOSIS — M797 Fibromyalgia: Secondary | ICD-10-CM | POA: Diagnosis not present

## 2023-07-13 DIAGNOSIS — R42 Dizziness and giddiness: Secondary | ICD-10-CM | POA: Diagnosis not present

## 2023-07-13 DIAGNOSIS — E119 Type 2 diabetes mellitus without complications: Secondary | ICD-10-CM | POA: Diagnosis not present

## 2023-07-13 DIAGNOSIS — I444 Left anterior fascicular block: Secondary | ICD-10-CM | POA: Diagnosis not present

## 2023-07-17 ENCOUNTER — Telehealth: Payer: Self-pay

## 2023-07-17 NOTE — Progress Notes (Signed)
   07/17/2023  Patient ID: Karen Stein, female   DOB: May 01, 1971, 53 y.o.   MRN: [id number]  Contacted patient regarding referral for diabetes from a quality report. Patient is on the True [facility] Diabetes Metric Roster for an elevated A1c.   Left patient a voicemail to return my call at their convenience  Harlon Flor, PharmD Clinical Pharmacist  [phone]

## 2023-07-18 DIAGNOSIS — G894 Chronic pain syndrome: Secondary | ICD-10-CM | POA: Diagnosis not present

## 2023-07-18 DIAGNOSIS — M7918 Myalgia, other site: Secondary | ICD-10-CM | POA: Diagnosis not present

## 2023-07-18 DIAGNOSIS — Z79899 Other long term (current) drug therapy: Secondary | ICD-10-CM | POA: Diagnosis not present

## 2023-07-31 NOTE — Progress Notes (Signed)
   07/31/2023  Patient ID: Karen Stein, female   DOB: 05-Apr-1971, 53 y.o.   MRN: [id number]  Contacted patient regarding referral for diabetes from a quality report. Patient is on the True [facility] Diabetes Metric Roster for an elevated A1c.   Left patient a voicemail to return my call at their convenience  Harlon Flor, PharmD Clinical Pharmacist  [phone]

## 2023-08-08 DIAGNOSIS — E1165 Type 2 diabetes mellitus with hyperglycemia: Secondary | ICD-10-CM | POA: Diagnosis not present

## 2023-08-08 DIAGNOSIS — E785 Hyperlipidemia, unspecified: Secondary | ICD-10-CM | POA: Diagnosis not present

## 2023-08-08 DIAGNOSIS — B379 Candidiasis, unspecified: Secondary | ICD-10-CM | POA: Diagnosis not present

## 2023-08-08 DIAGNOSIS — Z794 Long term (current) use of insulin: Secondary | ICD-10-CM | POA: Diagnosis not present

## 2023-08-08 DIAGNOSIS — E1169 Type 2 diabetes mellitus with other specified complication: Secondary | ICD-10-CM | POA: Diagnosis not present

## 2023-08-14 DIAGNOSIS — Z8673 Personal history of transient ischemic attack (TIA), and cerebral infarction without residual deficits: Secondary | ICD-10-CM | POA: Diagnosis not present

## 2023-08-14 DIAGNOSIS — I452 Bifascicular block: Secondary | ICD-10-CM | POA: Diagnosis not present

## 2023-08-14 DIAGNOSIS — W19XXXA Unspecified fall, initial encounter: Secondary | ICD-10-CM | POA: Diagnosis not present

## 2023-08-14 DIAGNOSIS — S6991XA Unspecified injury of right wrist, hand and finger(s), initial encounter: Secondary | ICD-10-CM | POA: Diagnosis not present

## 2023-08-14 DIAGNOSIS — Y990 Civilian activity done for income or pay: Secondary | ICD-10-CM | POA: Diagnosis not present

## 2023-08-14 DIAGNOSIS — S6992XA Unspecified injury of left wrist, hand and finger(s), initial encounter: Secondary | ICD-10-CM | POA: Diagnosis not present

## 2023-08-14 DIAGNOSIS — I517 Cardiomegaly: Secondary | ICD-10-CM | POA: Diagnosis not present

## 2023-08-14 DIAGNOSIS — M542 Cervicalgia: Secondary | ICD-10-CM | POA: Diagnosis not present

## 2023-08-14 DIAGNOSIS — R079 Chest pain, unspecified: Secondary | ICD-10-CM | POA: Diagnosis not present

## 2023-08-14 DIAGNOSIS — I444 Left anterior fascicular block: Secondary | ICD-10-CM | POA: Diagnosis not present

## 2023-08-14 DIAGNOSIS — Y9289 Other specified places as the place of occurrence of the external cause: Secondary | ICD-10-CM | POA: Diagnosis not present

## 2023-08-14 DIAGNOSIS — M25511 Pain in right shoulder: Secondary | ICD-10-CM | POA: Diagnosis not present

## 2023-08-14 DIAGNOSIS — M545 Low back pain, unspecified: Secondary | ICD-10-CM | POA: Diagnosis not present

## 2023-08-14 DIAGNOSIS — S299XXA Unspecified injury of thorax, initial encounter: Secondary | ICD-10-CM | POA: Diagnosis not present

## 2023-08-14 DIAGNOSIS — R0789 Other chest pain: Secondary | ICD-10-CM | POA: Diagnosis not present

## 2023-08-14 DIAGNOSIS — S3992XA Unspecified injury of lower back, initial encounter: Secondary | ICD-10-CM | POA: Diagnosis not present

## 2023-08-14 DIAGNOSIS — Z7982 Long term (current) use of aspirin: Secondary | ICD-10-CM | POA: Diagnosis not present

## 2023-08-14 DIAGNOSIS — M25512 Pain in left shoulder: Secondary | ICD-10-CM | POA: Diagnosis not present

## 2023-08-14 DIAGNOSIS — R531 Weakness: Secondary | ICD-10-CM | POA: Diagnosis not present

## 2023-08-14 DIAGNOSIS — S199XXA Unspecified injury of neck, initial encounter: Secondary | ICD-10-CM | POA: Diagnosis not present

## 2023-08-14 DIAGNOSIS — H538 Other visual disturbances: Secondary | ICD-10-CM | POA: Diagnosis not present

## 2023-08-14 DIAGNOSIS — M180 Bilateral primary osteoarthritis of first carpometacarpal joints: Secondary | ICD-10-CM | POA: Diagnosis not present

## 2023-08-14 DIAGNOSIS — Z5181 Encounter for therapeutic drug level monitoring: Secondary | ICD-10-CM | POA: Diagnosis not present

## 2023-08-14 DIAGNOSIS — I4510 Unspecified right bundle-branch block: Secondary | ICD-10-CM | POA: Diagnosis not present

## 2023-08-15 DIAGNOSIS — M7918 Myalgia, other site: Secondary | ICD-10-CM | POA: Diagnosis not present

## 2023-08-15 DIAGNOSIS — Z760 Encounter for issue of repeat prescription: Secondary | ICD-10-CM | POA: Diagnosis not present

## 2023-08-15 NOTE — ED Provider Notes (Signed)
 " [facility] EMERGENCY DEPT  ED Provider Note History   Chief Complaint  Patient presents with   Fall   History of Present Illness History of Present Illness A 53 year old female with a history of stroke, fibromyalgia, chronic pain with spinal cord stimulator presents after a fall with head and back pain.  She fell on Tuesday around noon while at work at the [employer] office. She attempted to sit on a rollator, which rolled away, causing her to fall from a height onto her back. She believes she hit her head during the fall but does not recall losing consciousness. She also jammed her fingers while trying to stop the fall.  Since the fall, she has experienced significant chest pain, which has since subsided, as well as pain in her neck, shoulders, and lower back. She describes difficulty with movements such as pressing the gas pedal due to back pain. She also reports visual changes, specifically seeing dark spots in her left visual field, which she describes as 'dark lint'.  She has a history of two strokes, which occurred in December 2023 and January 2024. During those events, she experienced a severe headache and loss of control over her left leg. Since the fall, she notes her left side feels weaker, similar to her stroke symptoms, and she has difficulty raising her left leg as high as her right.  Symptoms of left lower extremity weakness have substantially subsided  She has a spinal cord stimulator implanted following back surgery. She is currently using the stimulator and reports no pain around the module. She takes aspirin  daily and was previously on anticoagulants following her strokes but is no longer on them.  No nausea or vomiting since the fall. She has a history of fibromyalgia and neuropathy in her feet, which cause chronic pain and numbness, but she reports no new numbness or changes in sensation since the fall.   Past Medical History:  Diagnosis Date   Anxiety    About being put  under,not being medicated well;claustophobic   Awareness under anesthesia    during entire emergency csection, awareness but unable to speak or move; severe anxiety associated with anesthesia   BMI 34.0-34.9,adult    Complication of anesthesia    I do not medicate well;  unable to speak but felt all the cu   Depression    Diabetes mellitus without complication (CMS/HHS-HCC)    Fibromyalgia    Hyperlipidemia    Obesity    Sleep apnea    Past Surgical History:  Procedure Laterality Date   CESAREAN SECTION  1995   Felt cutting, told Dr everything that happened but cldnt spk   OTHER SURGERY Bilateral 2008   bunion   BACK SURGERY  2010   low back   SPINE SURGERY  2010   Surgery Failed   IMPLANTATION PERCUTANEOUS EPIDURAL NEUROSTIMULATORY ELECTRODES TRIAL N/A 08/10/2020   Procedure: Spinal Cord Stimulator trial - Thoracic;  Surgeon: Susa Malon Rothman, MD;  Location: [facility] OPERATING ROOMS;  Service: Neurosurgery;  Laterality: N/A;  Please get pre-authorization for 2 leads.   IMPLANTATION PERCUTANEOUS EPIDURAL NEUROSTIMULATORY ELECTRODES TRIAL N/A 11/04/2020   Procedure: Percutaneous thoracic spinal cord stimulator Implant without laminectomy;  Surgeon: Susa Malon Rothman, MD;  Location: [facility] OR;  Service: Neurosurgery;  Laterality: N/A;   INCISION FOR IMPLANTATION/REPLACEMENT SPINAL NEUROSTIMULATOR Right 11/04/2020   Procedure: INSERTION OF SPINAL NEUROSTIMULATOR PULSE GENERATOR;  Surgeon: Susa Malon Rothman, MD;  Location: [facility] OR;  Service: Neurosurgery;  Laterality: Right;   ABDOMINAL  SURGERY     laparoscopy - fibroids, endometriosis   TRANSCERVICAL UTERINE FIBROID(S) ABLATION     Family History  Problem Relation Age of Onset   Allergies Father    Anxiety Father        Self   Depression Father        Self   Diabetes Father        Self   Glaucoma Father    Hyperlipidemia (Elevated cholesterol) Father        Self   High blood pressure (Hypertension)  Father    Stroke Father    Dementia Maternal Grandmother    Obesity Maternal Grandmother        Self   Osteoporosis (Thinning of bones) Maternal Grandmother    Anesthesia problems Neg Hx    Malignant hyperthermia Neg Hx    Malignant hypertension Neg Hx    Pseudochol deficiency Neg Hx    PONV Neg Hx    Social History   Socioeconomic History   Marital status: Single  Occupational History   Occupation: photographer  Tobacco Use   Smoking status: Never   Smokeless tobacco: Never  Vaping Use   Vaping status: Never Used  Substance and Sexual Activity   Alcohol use: Not Currently    Alcohol/week: 1.0 standard drink of alcohol    Types: 1 Glasses of wine per week   Drug use: Yes    Types: Marijuana    Comment: last used 1 month ago for pain   Sexual activity: Not Currently    Partners: Male    Birth control/protection: Post-menopausal   Social Drivers of Health   Financial Resource Strain: Medium Risk (05/31/2022)   Received from [facility], [place]   Overall Financial Resource Strain (CARDIA)    Difficulty of Paying Living Expenses: Somewhat hard  Food Insecurity: Food Insecurity Present (05/31/2022)   Received from [facility], [place]   Hunger Vital Sign    Worried About Running Out of Food in the Last Year: Sometimes true    Ran Out of Food in the Last Year: Sometimes true  Transportation Needs: No Transportation Needs (05/31/2022)   Received from [facility], [place]   PRAPARE - Transportation    Lack of Transportation (Medical): No    Lack of Transportation (Non-Medical): No  Housing Stability: Unknown (08/14/2023)   Housing Stability Vital Sign    Homeless in the Last Year: No   Review of Systems  Physical Exam  BP 138/71   Pulse 70   Temp 36.9 C (98.5 F) (Oral)   Resp 18   LMP  (LMP Unknown) Comment: over a year since last period per pt  SpO2 100%  Physical Exam Physical Exam GENERAL: Alert, cooperative, well developed, no  acute distress. HEENT: Normocephalic, mild tenderness to palpation in the posterior left lateral aspect of the head.  Normal oropharynx, moist mucous membranes.  Tenderness to palpation in the paraspinal musculature in the superior cervical spine CHEST: Clear to auscultation bilaterally, no wheezes, rhonchi, or crackles. CARDIOVASCULAR: Normal heart rate and rhythm, S1 and S2 normal without murmurs. ABDOMEN: Soft, non-tender, non-distended, without organomegaly, normal bowel sounds. EXTREMITIES: No cyanosis or edema. NEUROLOGICAL: Cranial nerves grossly intact, pupils equal, round, and reactive to light, moves all extremities without gross motor or sensory deficit, sensation symmetric and intact.   Procedures  Procedures   Medical Decision Making   Medical Complexity:    New and requires workup.     Pertinent labs & imaging results  that were available during my care of the patient were reviewed by me and considered in my medical decision making.     I reviewed previous medical records.     I independently visualized image(s), tracing(s), and/or specimen(s).     I discussed the patient with another provider.         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  Patient noted to strike her head, although no loss of consciousness.  CT C, T, L spine, bilateral hands and wrist x-ray reassuring against any traumatic related fracture, dislocation.  Symptoms of intermittent weakness and visual changes characterized as a dark lint concerning for intracranial pathology.  Patient is devoid of any visual deficits, sensory deficits or any motor deficits.  On my exam will await the results of the CT head Noncon.  Otherwise laboratory data reassuring.  ED Course as of 08/15/23 0408  Wed Aug 15, 2023  0324 Glucose(!): 290 [AC]  0324 WBC: 8.3 [AC]  0324 Hemoglobin: 14.4 [AC]    ED Course User Index [AC] Librado Ethridge Tita Aloysius, MD      Medications  Administered in the Emergency Department   oxyCODONE  (ROXICODONE ) immediate release tablet 10 mg (10 mg Oral Given 08/15/23 0039)   ED Clinical Impression  1. Fall, initial encounter   2. Chest pain, unspecified type        Librado Ethridge Tita Aloysius, MD Resident 08/15/23 [phone]  "

## 2023-08-22 DIAGNOSIS — G894 Chronic pain syndrome: Secondary | ICD-10-CM | POA: Diagnosis not present

## 2023-08-28 NOTE — Progress Notes (Signed)
   08/28/2023  Patient ID: Karen Stein, female   DOB: Dec 05, 1970, 53 y.o.   MRN: [id number]  Contacted patient regarding referral for diabetes from a quality report. Patient is on the True [facility] Diabetes Metric Roster for an elevated A1c.   Left patient a voicemail to return my call at their convenience  Livia Riffle, PharmD Clinical Pharmacist  [phone]

## 2023-08-29 DIAGNOSIS — Z9682 Presence of neurostimulator: Secondary | ICD-10-CM | POA: Diagnosis not present

## 2023-08-29 DIAGNOSIS — I651 Occlusion and stenosis of basilar artery: Secondary | ICD-10-CM | POA: Diagnosis not present

## 2023-08-29 DIAGNOSIS — Z79899 Other long term (current) drug therapy: Secondary | ICD-10-CM | POA: Diagnosis not present

## 2023-08-29 DIAGNOSIS — Z794 Long term (current) use of insulin: Secondary | ICD-10-CM | POA: Diagnosis not present

## 2023-08-29 DIAGNOSIS — E1165 Type 2 diabetes mellitus with hyperglycemia: Secondary | ICD-10-CM | POA: Diagnosis not present

## 2023-08-29 DIAGNOSIS — R5383 Other fatigue: Secondary | ICD-10-CM | POA: Diagnosis not present

## 2023-08-29 DIAGNOSIS — Z7984 Long term (current) use of oral hypoglycemic drugs: Secondary | ICD-10-CM | POA: Diagnosis not present

## 2023-08-29 DIAGNOSIS — I6930 Unspecified sequelae of cerebral infarction: Secondary | ICD-10-CM | POA: Diagnosis not present

## 2023-08-29 DIAGNOSIS — E1169 Type 2 diabetes mellitus with other specified complication: Secondary | ICD-10-CM | POA: Diagnosis not present

## 2023-08-29 DIAGNOSIS — E785 Hyperlipidemia, unspecified: Secondary | ICD-10-CM | POA: Diagnosis not present

## 2023-09-04 NOTE — Progress Notes (Signed)
   09/04/2023  Patient ID: Karen Stein, female   DOB: 1970/12/09, 53 y.o.   MRN: [id number]  Contacted patient regarding referral for diabetes from a quality report. Patient is on the True [facility] Diabetes Metric Roster for an elevated A1c.   Left patient a voicemail to return my call at their convenience  Livia Riffle, PharmD Clinical Pharmacist  [phone]

## 2023-09-06 DIAGNOSIS — E1165 Type 2 diabetes mellitus with hyperglycemia: Secondary | ICD-10-CM | POA: Diagnosis not present

## 2023-09-06 DIAGNOSIS — Z794 Long term (current) use of insulin: Secondary | ICD-10-CM | POA: Diagnosis not present

## 2023-09-19 DIAGNOSIS — G894 Chronic pain syndrome: Secondary | ICD-10-CM | POA: Diagnosis not present

## 2023-09-19 DIAGNOSIS — E1165 Type 2 diabetes mellitus with hyperglycemia: Secondary | ICD-10-CM | POA: Diagnosis not present

## 2023-09-19 DIAGNOSIS — Z794 Long term (current) use of insulin: Secondary | ICD-10-CM | POA: Diagnosis not present

## 2023-10-08 ENCOUNTER — Other Ambulatory Visit (HOSPITAL_COMMUNITY): Payer: Self-pay

## 2023-10-08 ENCOUNTER — Other Ambulatory Visit (HOSPITAL_BASED_OUTPATIENT_CLINIC_OR_DEPARTMENT_OTHER): Payer: Self-pay

## 2023-10-08 MED ORDER — BUPRENORPHINE 20 MCG/HR TD PTWK
1.0000 | MEDICATED_PATCH | TRANSDERMAL | 0 refills | Status: AC
  Filled 2023-10-08: qty 4, 28d supply, fill #0

## 2023-10-09 ENCOUNTER — Other Ambulatory Visit (HOSPITAL_BASED_OUTPATIENT_CLINIC_OR_DEPARTMENT_OTHER): Payer: Self-pay

## 2023-10-09 ENCOUNTER — Other Ambulatory Visit: Payer: Self-pay

## 2023-10-09 DIAGNOSIS — R110 Nausea: Secondary | ICD-10-CM | POA: Diagnosis not present

## 2023-10-09 DIAGNOSIS — E1169 Type 2 diabetes mellitus with other specified complication: Secondary | ICD-10-CM | POA: Diagnosis not present

## 2023-10-09 DIAGNOSIS — Z794 Long term (current) use of insulin: Secondary | ICD-10-CM | POA: Diagnosis not present

## 2023-10-09 DIAGNOSIS — E1165 Type 2 diabetes mellitus with hyperglycemia: Secondary | ICD-10-CM | POA: Diagnosis not present

## 2023-10-09 DIAGNOSIS — E785 Hyperlipidemia, unspecified: Secondary | ICD-10-CM | POA: Diagnosis not present

## 2023-10-09 MED ORDER — DEXCOM G6 TRANSMITTER MISC
3 refills | Status: AC
  Filled 2023-10-09: qty 1, 90d supply, fill #0
  Filled 2023-11-21: qty 1, 30d supply, fill #0
  Filled 2024-02-23: qty 1, 30d supply, fill #1

## 2023-10-09 MED ORDER — DEXCOM G6 SENSOR MISC
11 refills | Status: AC
  Filled 2023-10-09 – 2023-11-21 (×2): qty 3, 30d supply, fill #0
  Filled 2024-02-23: qty 3, 30d supply, fill #1

## 2023-10-09 MED ORDER — INSULIN LISPRO 100 UNIT/ML IJ SOLN
150.0000 [IU] | Freq: Every day | INTRAMUSCULAR | 1 refills | Status: AC
  Filled 2023-10-09 – 2023-11-21 (×2): qty 150, 100d supply, fill #0
  Filled 2024-02-23: qty 150, 100d supply, fill #1

## 2023-10-09 MED ORDER — ONDANSETRON 4 MG PO TBDP
ORAL_TABLET | ORAL | 2 refills | Status: DC
  Filled 2023-10-09: qty 20, 7d supply, fill #0

## 2023-10-09 MED ORDER — OMNIPOD 5 DEXG7G6 PODS GEN 5 MISC
5 refills | Status: AC
  Filled 2023-10-09 – 2023-11-21 (×2): qty 10, 30d supply, fill #0
  Filled 2024-02-23: qty 10, 30d supply, fill #1

## 2023-10-22 ENCOUNTER — Other Ambulatory Visit (HOSPITAL_BASED_OUTPATIENT_CLINIC_OR_DEPARTMENT_OTHER): Payer: Self-pay

## 2023-10-23 ENCOUNTER — Other Ambulatory Visit (HOSPITAL_BASED_OUTPATIENT_CLINIC_OR_DEPARTMENT_OTHER): Payer: Self-pay

## 2023-10-24 ENCOUNTER — Other Ambulatory Visit (HOSPITAL_BASED_OUTPATIENT_CLINIC_OR_DEPARTMENT_OTHER): Payer: Self-pay

## 2023-11-07 ENCOUNTER — Other Ambulatory Visit (HOSPITAL_BASED_OUTPATIENT_CLINIC_OR_DEPARTMENT_OTHER): Payer: Self-pay

## 2023-11-07 ENCOUNTER — Other Ambulatory Visit: Payer: Self-pay

## 2023-11-07 DIAGNOSIS — G894 Chronic pain syndrome: Secondary | ICD-10-CM | POA: Diagnosis not present

## 2023-11-07 MED ORDER — BUPRENORPHINE 20 MCG/HR TD PTWK
1.0000 | MEDICATED_PATCH | TRANSDERMAL | 2 refills | Status: AC
  Filled 2023-11-07: qty 4, 28d supply, fill #0

## 2023-11-08 ENCOUNTER — Other Ambulatory Visit (HOSPITAL_BASED_OUTPATIENT_CLINIC_OR_DEPARTMENT_OTHER): Payer: Self-pay

## 2023-11-09 ENCOUNTER — Other Ambulatory Visit (HOSPITAL_BASED_OUTPATIENT_CLINIC_OR_DEPARTMENT_OTHER): Payer: Self-pay

## 2023-11-19 ENCOUNTER — Other Ambulatory Visit (HOSPITAL_BASED_OUTPATIENT_CLINIC_OR_DEPARTMENT_OTHER): Payer: Self-pay

## 2023-11-21 ENCOUNTER — Other Ambulatory Visit (HOSPITAL_BASED_OUTPATIENT_CLINIC_OR_DEPARTMENT_OTHER): Payer: Self-pay

## 2024-01-02 DIAGNOSIS — E785 Hyperlipidemia, unspecified: Secondary | ICD-10-CM | POA: Diagnosis not present

## 2024-01-02 DIAGNOSIS — Z794 Long term (current) use of insulin: Secondary | ICD-10-CM | POA: Diagnosis not present

## 2024-01-02 DIAGNOSIS — E1165 Type 2 diabetes mellitus with hyperglycemia: Secondary | ICD-10-CM | POA: Diagnosis not present

## 2024-01-02 DIAGNOSIS — E1169 Type 2 diabetes mellitus with other specified complication: Secondary | ICD-10-CM | POA: Diagnosis not present

## 2024-02-06 ENCOUNTER — Other Ambulatory Visit (HOSPITAL_BASED_OUTPATIENT_CLINIC_OR_DEPARTMENT_OTHER): Payer: Self-pay

## 2024-02-06 MED ORDER — BUPRENORPHINE 20 MCG/HR TD PTWK
1.0000 | MEDICATED_PATCH | TRANSDERMAL | 2 refills | Status: AC
  Filled 2024-02-06 – 2024-02-25 (×4): qty 4, 28d supply, fill #0
  Filled 2024-03-23: qty 4, 28d supply, fill #1

## 2024-02-07 ENCOUNTER — Other Ambulatory Visit: Payer: Self-pay

## 2024-02-08 ENCOUNTER — Other Ambulatory Visit (HOSPITAL_BASED_OUTPATIENT_CLINIC_OR_DEPARTMENT_OTHER): Payer: Self-pay

## 2024-02-08 ENCOUNTER — Other Ambulatory Visit: Payer: Self-pay

## 2024-02-18 ENCOUNTER — Other Ambulatory Visit (HOSPITAL_BASED_OUTPATIENT_CLINIC_OR_DEPARTMENT_OTHER): Payer: Self-pay

## 2024-02-23 ENCOUNTER — Other Ambulatory Visit (HOSPITAL_BASED_OUTPATIENT_CLINIC_OR_DEPARTMENT_OTHER): Payer: Self-pay

## 2024-02-25 ENCOUNTER — Other Ambulatory Visit (HOSPITAL_BASED_OUTPATIENT_CLINIC_OR_DEPARTMENT_OTHER): Payer: Self-pay

## 2024-02-26 ENCOUNTER — Other Ambulatory Visit (HOSPITAL_BASED_OUTPATIENT_CLINIC_OR_DEPARTMENT_OTHER): Payer: Self-pay

## 2024-03-03 NOTE — Progress Notes (Signed)
 [facility]: Stroke Clinic Department of Neurology Division of Vascular and Stroke Neurology  Follow up visit Referring Provider: Self  03/04/2024  History of Present Illness Karen Stein is a 53 y.o. right handed female with a past medical history significant for right sided borderzone infarcts in February 2024, uncontrolled diabetes, hyperlipidemia, obesity, spinal cord stimulator, and bilateral intracranial stenosis who presents to the stroke clinic today for follow up. She is here alone today.  INTERVAL HISTORY 10/09/23 - Endocrinology clinic visit at [facility]. Assessment/Plan 1. Type 2 diabetes mellitus with hyperglycemia, with long-term current use of insulin  (CMD) (Primary) Hemoglobin A1c has improved to 10.3% but is still above goal.  Start back on OP5 insulin  pump and Dexcom.  Advised to work on bolusing before every meal.  Advised to try to stay in automated mode as much as possible.  Work on low carb.  Increase physical activity. - POC Hemoglobin A1c - POC Glucose (Hemocue/NOVA) - insulin  pump cartridge automated dosing (Omnipod 5 G6-G7 Pods, Gen 5,) crtg subcutaneous cartridge; Change pods every 3 days for insulin  administration Dispense: 10 each; Refill: 5 - insulin  lispro (HumaLOG ) 100 unit/mL injection; Use in insulin  pump. Max daily dose is 150 units. Dispense: 150 mL; Refill: 1 - Dexcom G6 Transmitter; 1 each by miscellaneous route every 3 (three) months. Dispense: 1 each; Refill: 3 - blood-glucose sensor (Dexcom G6 Sensor); Use one sensor every 10 days for monitoring blood glucose. Dispense: 3 each; Refill: 11  10/31/23 - Stroke clinic visit with me. No show.  01/02/24 - Endocrinology clinic visit at [facility]. 1. Type 2 diabetes mellitus with hyperglycemia, with long-term current use of insulin  (CMD) (Primary) Hemoglobin A1c has increased to 13.1% and is significantly above goal.  Start Lantus  16 units nightly.  Take Humalog  TID AC per sliding scale. Use  sliding scale below.  Work on low [place].  Increase physical activity.  Referred to diabetes education for MNT.  Will obtain CMP and C-peptide to check for endogenous insulin  production.  Will consider adding on oral medication if she is still producing her own insulin .  - POC Glucose (Hemocue/NOVA) - POC Hemoglobin A1c - Comprehensive Metabolic Panel; Future - Lipid Panel; Future - C-peptide; Future - glucose monitoring kit kit; Needs 1 Accu-Chek SoftClix lancing device for E11.65 Dispense: 1 each; Refill: 0  02/05/24 - Stroke clinic visit with me. Patient canceled.  03/04/24 - Stroke clinic visit with me. She has an appointment with the cardiometabolic clinic in January 2026. She has been working with endocrinology to adjust her insulin  regimen as she has not been able to tolerate or afford other oral medications.  She says she has been inconsistently taking her medications, including the rosuvastatin. She says she usually will turn the medication bottle upside down to indicate that she took the medication, but then she gets confused and is not sure if she took the medication or not, and due to her fear of overdosing on medications, she will sometimes end up not taking medications for a few days.  She feels that she is struggling at work due to her memory as she has to write everything down. She was initially told by her disability team that she was able to supplement her income by working part time up to a certain extent, but now she feels like she is being penalized for trying to work.         PRIOR HISTORY 12/08/21 - Neurosurgery clinic visit. ASSESSMENT/PLAN:  Karen Stein is a/an  53 y.o. female here today for second opinion of intracranial stenosis. NO clear TIA. On ASA 81mg .  We discussed the nature of intracranial stenosis and indication for medical management for asymptomatic intracranial stenosis: continue aspirin  81mg , f/u with PCP for cholesterol management and avoid  hypotension. MRA brain w/o contrast for re-evaluation. MRA order marked to contact neurostimulator rep. I will call with results.  Reviewed FAST/ER.  Referrals placed to cardiology, sleep specialist and memory specialist.  05/29/22-06/01/22 [facility] admission. Brief narrative: Karen Stein is a 53 y.o. female with PMH significant for DM2, HLD, obesity, OSA, chronic back pain status post neurostimulator in July 2022, peripheral neuropathy, gastroparesis, anxiety/depression 1/29, patient presented to the ED with complaint of lower extremity weakness. Patient reported that the previous day she woke up with a bad headache and after a few hours she developed numbness in her left leg which persisted into the next day. Next day, she also felt wobbly leg and was unable to stand and hence presented to the ED. In the ED, CT head unremarkable CTA head and neck showed severe stenosis in distal right RCA, just proximal to the terminus, moderate stenosis in proximal left A1 and mild stenosis in the bilateral cavernous and supraclinoid ICAs.  Seen by neurology Admitted to [facility] for stroke workup Subjective: Patient was seen and examined this morning. Sitting up in recliner. Not in distress. No new symptoms. Assessment and plan: Left leg weakness numbness, pain and cramps Initially presented with symptoms as above. Patient was admitted with a concern of possible TIA. CT head and CT a head as above.  MRI brain and cervical spine were ordered but could not be done because of the presence of spinal stimulator which is not compatible with MRI. Neurology consult was obtained. Discussed with neurologist Dr. Rosemarie. Per neurology evaluation, patient's symptoms are more likely to be secondary to L5-S1 lumbar radiculopathy. Unlikely to be TIA. Previously, patient was on aspirin , Plavix  and Lipitor which she will continue Patient will follow-up with her providers at [facility] if MRI is needed. Carotid artery  stenosis CTA head and neck showed severe stenosis in distal right RCA, just proximal to the terminus, moderate stenosis in proximal left A1 and mild stenosis in the bilateral cavernous and supraclinoid ICAs.  Patient states she was seen by vascular surgery as an outpatient. She reports that was offered stenting but she did not want to do it because of the risks associated. She is medically managed on aspirin , Plavix  and statin. Mixed hyperlipidemia LDL 155, HDL 31, cholesterol 202 Started on Lipitor 80 mg daily. Uncontrolled type 2 diabetes mellitus with hyperglycemia A1c 10 on 05/29/2022 PTA on Ozempic weekly, insulin  pump and managed under the care of endocrinologist In the hospital, patient was managed with long-acting insulin  and [place] sliding scale insulin . Post discharge, patient will continue same regimen. She definitely needs adjustment of insulin  given her high A1c. Diabetes care coordinator consulted History of gastroparesis Continue Reglan  Diabetic neuropathy Continue Lyrica , Robaxin , oxycodone  Obesity  Body mass index is 36.34 kg/m. Patient has been advised to make an attempt to improve diet and exercise patterns to aid in weight loss. Mobility: PT eval obtained. Home with PT recommended   06/05/22-06/06/22 - [facility] admission. Karen Stein is a 53 y.o. female with a history of 2 diabetes, hypertension, hyperlipidemia uncontrolled, obesity, OSA, chronic back pain with neurostimulator ([place]), peripheral neuropathy anxiety depression who was consulted to Neurology for lower left extremity weakness. Patient was recently seen at an OSH  hospital for similar episodes. She reports that last Monday she developed a headache and sharp pain behind the eye/temple and jaw bone. Progressively that developed into symptoms of her toes cramping. Later on she felt like she had no control over her left leg and developed numbness, tingling and feeling of the leg going to sleep. She reported no other  symptoms. At the ER she had a CT head done but was unable to get an MRI due to spine stimulators which she was told was not compatible to the MRI machine.  She reports that this morning she woke up at 5 AM feeling weak, tingling and numb on the left leg. But no other symptoms elsewhere. She immediately took Eliquis and ASA once she felt those symptoms. Reports her LKN at 10:30 PM. Reports no headaches, dysphagia, slurred speech, facial numbness, hoarseness, reports some blurred visions that come and go. Also reports palpitations that do not last long. Per chart review, CTA head from the ED showed severe stenosis in the distal Right ICA, proximal to the terminus, moderate stenosis in proximal left A1 and mild stenosis in the bilateral cavernous and supraclinoid ICAs. She was admitted for a stroke workup. She was seen by Neurology while inpatient who determined that her symptoms were more likely 2/2 to L5-S1 lumbar radiculopathy and unlikely due to TIA. She was told to continue on her ASA. Also per chart review, she was seen by vascular surgery as an outpatient and was offered stenting for carotid artery stenosis but declined due to risks and is currently being medically managed on ASA, plavix  and statin. At discharge she was advised to continue on ASA, Plavix  and Lipitor.  She reports that she later saw her PCP who later switched her from ASA/Plavix  to ASA and eliquis. PCP referred to [facility] and has a scheduled MRI for 06/06/22 here at [facility].  She reports a long history of Afib but reports no current medication or past surgeries on it. Also reports that she has a bulging disc. Assessment:  [name] is a 53 y.o. Female with history of Type 2 DM, hypertension, hyperlipidemia uncontrolled, obesity, OSA, chronic back pain with neurostimulator, peripheral neuropathy anxiety depression who was consulted to Neurology for lower left extremity weakness. Patient symptoms of left sided weakness correlate with  right sided punctate infarcts on imaging. Source is likely 2/2 R ICA atherosclerosis. Would recommend increasing Atorvastatin  to 80 mg and continuing with plan as stated yesterday. She will also benefit from being re-evaluated by vascular surgery, especially since she's symptomatic from the R ICA atherosclerosis. Per chart review, it appears that she was evaluated in the past but no intervention was done at the time.   Updated Recommendations: - Increase Atorvastatin  to 80 mg  - continue ASA, Plavix  for 90 days and then ASA 81 mg indefinitely - discontinue Eliquis (no indication for use currently) - Ambulatory referral to Vascular surgery for evaluation and intervention  06/14/22 - Endocrinology clinic visit. Type 2 diabetes mellitus with hyperglycemia, with long-term current use of insulin  (HCC) -     insulin  aspart, niacinamide, (FIASP  U-100 INSULIN ) 100 unit/mL injection; For use in insulin  pump. Up to 100 units every 3 days. -     Amb Referral to Diabetes Education; Future Other orders -     POCT Glucose -     insulin  pump cart,automated,BT (OMNIPOD 5 G6 PODS, GEN 5,) Crtg; Change pods every 3 days for insulin  administration -     insulin  pump cart,auto,BT-cntr (OMNIPOD  5 G6 INTRO KIT, GEN 5,) Crtg; Inject 1 kit into the skin once for 1 dose. -     insulin  aspart, niacinamide, (FIASP  U-100 INSULIN ) 100 unit/mL injection; For use in insulin  pump  Plan: -- uncontrolled type 2 diabetes with hyperglycemia, A1c 9.9% -- I have adjusted patient's Omnipod Dash pump settings as follows: BASAL: 1.7 units/hr BOLUS: 12A: ICR adjusted to 1:8 -- discussed with patient that I think she would benefit from using the Dexcom G6 along with the Omnipod 5 as she can use automated mode which will help achieve better glycemic control  -- referral placed to CDE for pump upgrade  -- encouraged patient to bolus regularly  -- follow up in 3 months  08/01/22 - Stroke clinic visit with me. She reports having some  intermittent pain in her left elbow that comes and goes, but otherwise her left sided weakness has resolved. She reports more fatigue that usual. She has been tolerating atorvastatin  well so far. She has a statin allergy listed with vomiting, but she says she actually found that it was Ozempic that was causing vomiting. She was worried about her cholesterol and wanted to be on something stronger than atorvastatin  80 mg daily if possible. She is working with her other providers to get her blood sugars better controlled. PLAN: Right cerebral punctate infarctions Late effect of stroke: fatigue Mild bilateral intracranial ICA stenosis -Etiology unclear, possibly large artery atherosclerosis, though her stenosis is rated as mild. She also carries an unclear diagnosis of atrial fibrillation. She said she was started on apixaban at one point, but it was unclear if she actually had atrial fibrillation or not.  -Will order a cardiac event monitor to see if she has atrial fibrillation given her unclear history and given her complaints of palpitations. -Continue aspirin  81 mg daily. Okay to continue clopidogrel  75 mg daily for 90 days, end date ~09/02/22. -Risk factor management as below. Hyperlipidemia, uncontrolled -LDL goal < 70, last LDL was 155 in January 2024. -She would prefer to change to rosuvastatin, will change to rosuvastatin 40 mg daily. Future refills can go through her PCP. -Lipid panel can be repeated in a couple of months. Type II diabetes, uncontrolled -Hemoglobin A1c goal < 7.0%, last A1c was 9.9% in February 2024. -Continue insulin  with insulin  pump as directed. -Follow up with PCP.   08/07/22 - Neurosurgery telemedicine visit. ASSESSMENT/PLAN: Karen Stein is a/an 53 y.o. female with B/L intracranial stenosis (N/L cavernous and proximal basilar on non-invasive imaging ) who presents today for a follow up. Since last year, she had right sided punctate infarcts in Feb 2024. Etiology is  still being work-up with cardiac monitoring to assess for a.fib. We discussed the nature of intracranial stenosis and indications and risks of diagnostic angiogram (DSA) to characterize stenosis if secondary to intracranial atherosclerosis (ICAD) vs moyamoya, which we could then counsel if she is a candidate for any surgical interventions. We have decided: She would like to proceed with DSA DSA with steroid for iodine Due to severe anxiety will set up with anesthesia team, can decide day of whether IV sedation vs GA. Discussed increased risk with GA.  Continue aspirin  and plavix .  08/19/22 - [facility] ED visit. Patient presents with myalgias and cramping.  Patient found to be dehydrated.  Patient has a complicated history including TIA, diabetes. Patient was treated with IV fluids and IV pain medicine and is now feeling improved  08/30/22 [facility] ED visit. Patient is clinically well  in appearance.  She has had about a months worth of cramping and spasmodic pain of her legs feet and lower back.  She describes sometimes getting what sounds like carpopedal spasm.  Will check basic lab work for possible electrolyte derangement\anemia\myositis. CK normal, electrolyte panel without derangement, CBC normal without leukocytosis or anemia. Patient's symptoms are fairly diffuse and have been for a number of months.  At this time she is clinically well in appearance and does not have any electrolyte imbalance.  CK normal ruling out probable myositis.  CBC normal, less likely to be infectious or secondary to anemia.  Discussed possible medication side effect but at this time I do not feel that I should change patient's medications and recommend she discuss this with both her endocrinologist and her PCP.  Patient also has history fibromyalgia may be contributory.  However at this time with patient stable and otherwise well in appearance, safe for discharge with close follow-up.  01/13/23 - [facility] ED visit.  Neurology consult note: [name] is a 53 y.o. Female with PMHx R cerebral punctate borderzone infarcts, bilateral intracranial ICA stenosis, HLD, DM2, spinal cord stimulator, OSA who presented to the ED out of concern for muscle spasms and numbness. Neurology is consulted for the same.  This morning, she developed sudden cramping in the right hand and foot lasting several seconds.  This recurred 5 times back-to-back, prompting her to take 2 baby aspirin  and leftover Plavix  and present to the emergency room.  She is concerned that she presented similarly with left-sided symptoms and was found to have evidence of right sided punctate infarcts in February 2024. She follows in stroke clinic with Dr. Ladora, who is working her up for these infarctions, querying borderzone pattern infarct vs embolic phenomenon given unclear history of atrial fibrillation. After these episodes of spasm, she has returned completely back to baseline. Her neurologic exam is completely nonfocal, without lateralized weakness or numbness.  Given she had the exact same presentation in February on the opposite side and was found to have evidence of stroke on MRI, query if her spasms could represent new infarct to the supplementary motor cortex.  Notably though, compared to her last ED visit, she does not have any lateralizing weakness on today's exam.  Initially recommended the patient stay in the [facility] for MRI as she did last time. Patient was reluctant to stay after spending a long time in the ED and in pain from fibromyalgia. After discussion with the supervising attending, we will plan for MRI in the outpatient setting. Will also start her on a 21 day course of DAPT (ASA 81mg  daily, Plavix  75mg  daily) out of concern for possible TIA. Will schedule patient for followup in stroke clinic as well. RECOMMENDATIONS: #C/f TIA  - Continue aspirin  81mg  daily. Start plavix  75mg  daily for 21 days (EOT 10/5) - Continue rosuvastatin 40mg   daily - We will establish followup with stroke clinic.  01/16/23 - Endocrinology clinic visit at [facility]. Assessment/Plan 1. Type 2 diabetes mellitus with hyperglycemia, with long-term current use of insulin  (CMS/HCC) Hemoglobin A1c is significantly elevated at 13.0%, and is above goal. Continue Jardiance  25 mg 1 tab daily. Start Tresiba 20 units every morning. Start Fiasp  before meals per new sliding scale.  See sliding scale below. Advised to do MDI until she is set up with her insulin  pump. Continue to monitor blood sugar. - POC Hemoglobin A1c - POC Glucose (Hemocue/NOVA) - pen needle, diabetic (Novofine 32)  32 gauge x 1/4 ndle; Inject insulin  once daily  Dispense: 100 each; Refill: 2 2. Hyperlipidemia associated with type 2 diabetes mellitus (CMS/HCC)  Continue statin. 3. Nausea Take Zofran  as needed. - ondansetron  (ZOFRAN -ODT) 4 mg disintegrating tablet; Dissolve 1 tablet (4 mg total) on tongue every 8 (eight) hours as needed for nausea or vomiting.  Dispense: 20 tablet; Refill: 2  02/23/23 - Stroke clinic visit with me. Since her last visit with me, she had a negative cardiac event monitor. She had another event where her right leg started cramping, which was similar to her prior stroke presentation but on the opposite side. She had a CTA that showed stable basilar stenosis, but she did not get an MRI at that time. Her symptoms resolved quickly, and she returned to baseline. She still occasionally gets intermittent tingling in her fingers bilaterally. She also feels an odd sensation in the back of her head like something is stuck in her brain and is trying to get sucked up a straw but having a hard time - perhaps it is her manifesting how she thinks her blood flow is going due to the known basilar stenosis. When she has these symptoms, she will take two extra aspirin , and she feels like it helps alleviate the symptoms. She reports having her lipid panel  checked with her PCP, and it remained high, though she is unsure of the exact value. She said her medication was increased from 40 mg to 80 mg. She is unsure if this was atorvastatin  or rosuvastatin - I had prescribed her rosuvastatin 40 mg daily at our last visit. She prefers natural remedies rather than medications when able. Her blood sugars have worsened, but she also says she has had issues with her insurance covering some of her medications and also covering her Dexcom device, so she has not been monitoring her sugars regularly. She has been eating more oatmeal with natural honey, butter, tumeric, and cinnamon, and she feels like when she is consistently eating that twice a day, she feels better overall and that her blood sugars improve. She has looked into getting Flax seed, chia, and pumpkin seeds to see if she can lower her cholesterol with those remedies.  PLAN: Right cerebral punctate infarctions Late effect of stroke: fatigue Mild bilateral intracranial ICA stenosis -Etiology unclear, possibly large artery atherosclerosis, though her stenosis is rated as mild. She also carries an unclear diagnosis of atrial fibrillation. She said she was started on apixaban at one point, but it was unclear if she actually had atrial fibrillation or not. A cardiac event monitor was negative for atrial fibrillation when performed in 2024. -Continue aspirin  81 mg daily. -Risk factor management as below. Hyperlipidemia, uncontrolled -LDL goal < 70, last LDL was 155 in January 2024. She says that a more recent lipid panel continued to be elevated, and whatever statin medication she was on was doubled from 40 mg daily to 80 mg daily. She is not sure if it was atorvastatin  or if it was the rosuvastatin - I had prescribed rosuvastatin 40 mg daily at our last visit, so it is unclear what or how much statin she is actually taking. Recommended that she follow up with her PCP - I would not recommend going above 40 mg  daily for rosuvastatin. -Lipid panel can be repeated in a couple of months. Type II diabetes, uncontrolled -Hemoglobin A1c goal < 7.0%, last A1c was 13.0% in September 2024. -She needs to continue working with her endocrinology team and  PCP to get the right regimen for her and one that she can afford with her insurance. I recommended that she reach out to her endocrinology team and have a social worker help her navigate the insurance barriers. -Follow up with PCP.   05/17/23 [facility] ED visit. Patient presented to the ED for evaluation of hyperglycemia as well as back pain.  Patient states she twisted and started having pain in her back.  Patient does have history of fibromyalgia.  It is possible this may have exacerbated her symptoms.  Symptoms suggestive of back strain.  No findings to suggest infection.  No acute neurologic dysfunction.  Urinalysis does not suggest infection.  Suspect her frequency was related to her hyperglycemia.  Will send off a urine culture Patient also noted hyperglycemia today.  She has run out of her short acting insulin .  Patient given IV fluids and insulin  in the ED.  Blood sugars improved.  No findings to suggest DKA. Evaluation and diagnostic testing in the emergency department does not suggest an emergent condition requiring admission or immediate intervention beyond what has been performed at this time.  The patient is safe for discharge and has been instructed to return immediately for worsening symptoms, change in symptoms or any other concerns.  05/22/23 - Endocrinology clinic visit at [facility]. Assessment/Plan 1. Type 2 diabetes mellitus with hyperglycemia, with long-term current use of insulin  (CMD) (Primary) Hemoglobin A1c has worsened to 13.6% and is significantly above goal. A1c goal is <7%.  Take supplied Levemir. Increase to 26 units nightly and increase by 2 units every 3 days until fasting blood sugar is <130. Given syringes with needles.  Given  sample of Lyumjev . Inject 20 units before eating and 6 units before snacks.  Restart Jardiance 25 mg 1 tab daily. Increase water intake.  Sent in glucometer and supplies. Monitor fasting blood sugar and 2 hours after eating.  Will work on ordering OP5 and Dexcom through [employer].  Will obtain albumin urine and lipid panel.  Schedule eye exam.  - POC Hemoglobin A1c - POC Glucose (Hemocue/NOVA) - Lipid Panel; Future - Albumin, Random Urine; Future - insulin  glargine (LANTUS  SoloStar) 100 unit/mL (3 mL) pen; Inject up to 50 units nightly  Dispense: 45 mL; Refill: 1 - [facility] HISTORICAL DIABETES FOOT EXAM [facility]umjev  Quantity: 1 pen Lot number: [id number] Expiration Date: 01/06/2024 Was patient education provided? yes Was the sample labeled? yes 2. Hyperlipidemia associated with type 2 diabetes mellitus (CMD) Will obtain lipid panel.  Continue statin.  - Lipid Panel; Future 3. Yeast infection Take diflucan  as needed for yeast infection.  - fluconazole  (DIFLUCAN ) 150 mg tablet; Take one and then repeat in 3 days if infection not gone  Dispense: 2 tablet; Refill: 11  07/13/23 - [facility] ED visit. Problems Addressed: Dizziness: complicated acute illness or injury with systemic symptoms    Details: Ongoing comorbidities without established follow-up follow-up care recently.  Workup however today does not indicate any electrolyte or renal dysfunction.  Patient's COVID influenza screen is negative.  His troponin is less than 3 not consistent with cardiac schema normal white count.  Consistent with pancreatitis and she has no evidence of pharyngitis. Fibromyalgia: chronic illness or injury with exacerbation, progression, or side effects of treatment  08/08/23 - Endocrinology clinic visit at [facility]. Plan Increase Lantus  to 60 units nightly.  Inject Novolog  or Humalog  15 units before each meal and 6 units before snacks. Inject 15 minutes before eating.  Sent in diflucan .  Will start  losartan or lisinopril  if there is protein in the urine.  Continue statin.  Return at earliest convenience while fasting to check labs. Nothing to eat or drink for 8 hours except water or black coffee.  08/14/23 [facility] ED visit. 53 year old lady presenting after a fall around noon while at work. Patient noted to strike her head, although no loss of consciousness. CT C, T, L spine, bilateral hands and wrist x-ray reassuring against any traumatic related fracture, dislocation. Symptoms of intermittent weakness and visual changes characterized as a dark lint concerning for intracranial pathology. Patient is devoid of any visual deficits, sensory deficits or any motor deficits. On my exam will await the results of the CT head Noncon. Otherwise laboratory data reassuring.  08/29/23 - Stroke clinic visit with me. She continues to have abnormal sensations in her head that she says is not a headache but is difficult for her to describe. She is concerned that she is having additional strokes, so she takes extra doses of aspirin  81 mg, up to three extra doses a day when she has these episodes. She feels like they are happening more frequently. She had a fall and presented to the ED on 08/14/23. She as sitting down in her chair when it wheeled backwards, and she landed on the ground and reportedly struck her head on the ground. She did not lose consciousness. CT scans were negative for any fractures, but she endorses mid to low back pain throughout. She reports following with a pain clinic outside of [facility], and when she went to go see them, she says she had to leave before they did a urine drug screen because her grandchild was in the hospital. She says that because she left before her UDS, she broke her pain contract, and now they dismissed her and will not prescribe any more pain medications. Since then, she has been treating her pain with marijuana. She says that putting it in her drinks has been slightly more  beneficial than smoking it. Her Lantus  was recently increased by endocrinology to 60 units nightly earlier this month, and she is still on Humalog  15 units before each meal with 6 units. She says she has not gotten all of her medications yet because of insurance issues.  Her cholesterol has been historically elevated, and she feels that her statin medications have not been helping. She reports that she is taking rosuvastatin but is unclear of the dose. Her last fill history of rosuvastatin was in June 2024 for a 30 day supply. PLAN: We are still not sure what medications she is actually taking. I continue to recommend that she bring all of her pill bottles to all of her appointments so that we can verify her current medications. 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 She also carries an unclear diagnosis of atrial fibrillation. She said she was started on apixaban at one point, but it was unclear if she actually had atrial fibrillation or not. A cardiac event monitor was negative for atrial fibrillation when performed in 2024. -Continue aspirin  81 mg daily. She also takes additional aspirin  81 mg tablets on her own accord when she feels like she has a weird sensation in her head that she is concerned for another stroke. She feels like taking the additional aspirin  helps with these episodes. -Risk factor management as below. Hyperlipidemia, uncontrolled -LDL goal <  70, last LDL was 195 today.  -Recommended referral to the cardiometabolic clinic. I suspect she may not be taking her statin as the last fill date for rosuvastatin was in June 2024 for a 30 day supply. She also has a fill history for atorvastatin  80 mg daily, though that was last filled on 12/13/23 for a 30 day supply. Type II diabetes, uncontrolled -Hemoglobin A1c goal < 7.0%,  last A1c was 12.0% in March 2025. -She needs to continue working with her endocrinology team and PCP to get the right regimen for her and one that she can afford with her insurance. She says she is working with her insurance company to get her meds funded. -Follow up with PCP and endocrinology.   Data and Imaging Review Lipid panel 08/29/23: Total cholesterol 260, HDL 35, LDL 195  Lipid panel 05/30/22: Total cholesterol 202, HDL 31, LDL 155  Lipid panel 03/07/21: Total cholesterol 230, HDL 42, LDL 179 Lipid panel 08/01/18: Total cholesterol 225, HDL 33, LDL 166  Lipid panel 04/14/10: Total cholesterol 209, HDL 35, LDL 149   Hemoglobin A1c 01/02/24: 13.1%  Hemoglobin A1c 10/09/23: 10.3%  Hemoglobin A1c 07/13/23: 12%  Hemoglobin A1c 05/22/23: 13.6%  Hemoglobin A1c 01/16/23: 13.0%  Hemoglobin A1c 06/05/22: 9.9% Hemoglobin A1c 05/29/22: 10.0%  Hemoglobin A1c 07/20/21: 10.9%  Hemoglobin A1c 03/10/21: 10.4%  Hemoglobin A1c 12/01/20: 9.1%  Hemoglobin A1c 07/20/20: 7.4%  Hemoglobin A1c 04/06/20: 8.3%  Hemoglobin A1c 02/24/20: 10.8%  Hemoglobin A1c 11/17/19: 12.2%  Hemoglobin A1c 09/05/19: 14.5%  Hemoglobin A1c 08/19/18: 9.0%  Hemoglobin A1c 09/21/16: 9.4%  Hemoglobin A1c 05/16/16: 13.9%  Hemoglobin A1c 04/14/10: 10.9%   Cardiac event monitor 08/01/22: No atrial fibrillation. TTE 05/30/22 at [facility]:  1. Left ventricular ejection fraction, by estimation, is 60 to 65%. The left ventricle has normal function. The left ventricle has no regional wall motion abnormalities. There is mild asymmetric left ventricular hypertrophy of the basal-septal segment. Left ventricular diastolic parameters were normal.   2. Right ventricular systolic function is normal. The right ventricular size is normal. Tricuspid regurgitation signal is inadequate for assessing PA pressure.   3. The mitral valve is grossly normal. No evidence of mitral valve regurgitation. No evidence of mitral stenosis.   4. The aortic valve is tricuspid.  Aortic valve regurgitation is not visualized. No aortic stenosis is present.  CT brain without contrast 08/15/23: Imaging personally reviewed, no acute intracranial findings.     CTA head and neck 01/13/23: Imaging personally reviewed, stable basilar stenosis.  IR cervicocerebral arch 4 vessel 08/11/22: IMPRESSION: Normal bilateral carotid and right vertebral artery angiogram. No evidence of aneurysms, AVMs or AVFs.   MRI brain with and without contrast 06/06/22: Imaging personally reviewed, there are two punctate areas of ischemia in the right frontal lobe concerning for a borderzone pattern, some with contrast enhancement, likely infarcts of varying (but recent) age. CTA head and neck 06/05/22: Imaging personally reviewed, stable basilar artery stenosis throughout and stable mild bilateral intracranial ICA stenosis noted.  MRA head without contrast 12/26/21: Imaging personally reviewed, mild bilateral narrowing of the intracranial ICAs. Stenosis of the basilar artery also noted.  MRI brain without contrast 06/09/21: Imaging personally reviewed, no acute infarction. Unremarkable MRI brain scan.    CTA head and neck 03/17/21 at outside facility: Imaging personally reviewed, mild bilateral intracranial stenosis of the internal carotid arteries. There are also areas of stenosis in the left vertebral artery and basilar arteries.  MRI brain without contrast 08/23/19 at [facility]: Imaging unavailable  for personal review, report states IMPRESSION:  1. No acute intracranial abnormality.  2. 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  3. Otherwise normal brain MRI.  MRI brain without contrast 01/27/19 at [facility]: Imaging unavailable for personal review, report states IMPRESSION:  1. No acute intracranial abnormality. The CT finding corresponds to chronic small vessel ischemia in the white matter adjacent to the  right caudate and frontal horn.  2. Heterogeneously decreased bone marrow  signal, perhaps related to sickle cell trait in this clinical setting. CT brain without contrast 01/27/19 at [facility]: Imaging unavailable for personal review, report states IMPRESSION:  1. A rounded region of low attenuation in the right frontal lobe adjacent to the frontal horn of the right lateral ventricle may  represent an age-indeterminate lacunar infarct. Recommend clinical correlation. No other acute intracranial abnormalities identified.  CT brain without contrast 11/12/01 at [facility]: Imaging unavailable for personal review, report states IMPRESSION  NO ACUTE ABNORMALITY. CT brain without contrast 11/10/01 at [facility]: Imaging unavailable for personal review, report states IMPRESSION  NEGATIVE NONCONTRAST CRANIAL CT.  Past Medical History:  Diagnosis Date   Anxiety    About being put under,not being medicated well;claustophobic   Awareness under anesthesia    during entire emergency csection, awareness but unable to speak or move; severe anxiety associated with anesthesia   BMI 34.0-34.9,adult    Complication of anesthesia    I do not medicate well;  unable to speak but felt all the cu   Depression    Diabetes mellitus without complication (CMS/HHS-HCC)    Fibromyalgia    Hyperlipidemia    Obesity    Sleep apnea    Review of Systems Constitutional: Denies fevers, change in appetite.  Visual: Denies blurry vision, double vision, loss of vision.  ENT: Denies hearing loss, tinnitus, sore throat, hoarseness, difficulty swallowing, difficulty smelling, discharge from ears, sinus discharge, use of hearing aids.  Hematologic/Lymphatic: Denies enlarged lymph nodes, limb swelling.  Cardio: Denies angina/chest pain, previous heart attack, passing out/fainting, blood clots/phlebitis. History of palpitations, unclear if she has atrial fibrillation or not. Respiratory: Denies cough, shortness of breath, wheezing.  Allergy/Immunology: Denies allergies to adhesive  tape, sutures, milk, pollen, latex, betadine. Denies runny nose.  GI: Denies diarrhea, constipation, abdominal pain, heartburn, blood in stool.  GU: Denies increased urge to urinate, increased frequency of urination, urinary retention, incontinence of urine, blood in urine. Dermatologic: Denies rashes, changing moles, ulcers.  Endocrine: Denies hot/cold intolerance, recent weight gain, recent weight loss, increased thirst.  Musculoskeletal: Denies back pain, joint pain, joint swelling, weakness.  Neuro: As in HPI.  Psychiatric: Denies hallucinations, mood changes, panic attacks.   Allergies  Allergen Reactions   Sulfa (Sulfonamide Antibiotics) Itching and Unknown    seeing spots Dizzy and sees spots with sulfa drugs    Amoxicillin -Pot Clavulanate Vomiting   Nitrofurantoin Monohyd/M-Cryst Itching, Nausea, Other (See Comments) and Dizziness    seeing spots   Shellfish Containing Products Itching and Unknown    seeing spots   Sulfur (Bulk) Other (See Comments)    Current Outpatient Medications  Medication Sig Dispense Refill   aspirin  81 MG EC tablet Take 81 mg by mouth once daily     blood glucose diagnostic (ACCU-CHEK GUIDE TEST STRIPS) test strip      blood-glucose meter Misc      blood-glucose meter,continuous (DEXCOM G6 RECEIVER) Misc 1 each by Misc.(Non-Drug; Combo Route) route continuous.     blood-glucose sensor (DEXCOM G6 SENSOR)  Devi 1 each by Miscellaneous route every 10 days.     blood-glucose transmitter (DEXCOM G6 TRANSMITTER) Devi every 3 (three) months     buprenorphine  (BUTRANS ) 20 mcg/hour Place 1 patch onto the skin every 7 (seven) days     ciclopirox (PENLAC) 8 % topical nail solution Apply topically     FIASP  U-100 INSULIN  100 unit/mL Soln Pump as directed daily     insulin  aspart, niacinamide, (FIASP  U-100 INSULIN ) 100 unit/mL Soln Pump as directed     insulin  degludec (TRESIBA U-100 INSULIN  SUBQ) Inject subcutaneously     insulin  pump  cartridge (OMNIPOD DASH 5 PACK POD) Crtg Use one pod every 48 hours     OMNIPOD DASH 5 PACK POD Crtg USE 1 POD EVERY 48 HOURS     ondansetron  (ZOFRAN ) 8 MG tablet Take 1 tablet (8 mg total) by mouth every 8 (eight) hours as needed     ondansetron  (ZOFRAN -ODT) 4 MG disintegrating tablet Take 1 tablet (4 mg total) by mouth every 8 (eight) hours as needed     pregabalin  (LYRICA ) 200 MG capsule Take 200 mg by mouth 3 (three) times daily     rosuvastatin (CRESTOR) 40 MG tablet Take 1 tablet (40 mg total) by mouth once daily 30 tablet 11   traMADoL  (ULTRAM ) 50 mg tablet 1 tablet as needed Orally twice a day As needed     No current facility-administered medications for this visit.   Past Surgical History:  Procedure Laterality Date   CESAREAN SECTION  1995   Felt cutting, told Dr everything that happened but cldnt spk   OTHER SURGERY Bilateral 2008   bunion   BACK SURGERY  2010   low back   SPINE SURGERY  2010   Surgery Failed   IMPLANTATION PERCUTANEOUS EPIDURAL NEUROSTIMULATORY ELECTRODES TRIAL N/A 08/10/2020   Procedure: Spinal Cord Stimulator trial - Thoracic;  Surgeon: Susa Malon Rothman, MD;  Location: [facility];  Service: Neurosurgery;  Laterality: N/A;  Please get pre-authorization for 2 leads.   IMPLANTATION PERCUTANEOUS EPIDURAL NEUROSTIMULATORY ELECTRODES TRIAL N/A 11/04/2020   Procedure: Percutaneous thoracic spinal cord stimulator Implant without laminectomy;  Surgeon: Susa Malon Rothman, MD;  Location: [facility] OR;  Service: Neurosurgery;  Laterality: N/A;   INCISION FOR IMPLANTATION/REPLACEMENT SPINAL NEUROSTIMULATOR Right 11/04/2020   Procedure: INSERTION OF SPINAL NEUROSTIMULATOR PULSE GENERATOR;  Surgeon: Susa Malon Rothman, MD;  Location: [facility] OR;  Service: Neurosurgery;  Laterality: Right;   ABDOMINAL SURGERY     laparoscopy - fibroids, endometriosis   TRANSCERVICAL UTERINE FIBROID(S) ABLATION      Social History   Socioeconomic History    Marital status: Single  Occupational History   Occupation: banking  Tobacco Use   Smoking status: Never   Smokeless tobacco: Never  Vaping Use   Vaping status: Never Used  Substance and Sexual Activity   Alcohol use: Not Currently    Alcohol/week: 1.0 standard drink of alcohol    Types: 1 Glasses of wine per week   Drug use: Yes    Types: Marijuana    Comment: last used 1 month ago for pain   Sexual activity: Not Currently    Partners: Male    Birth control/protection: Post-menopausal   Social Drivers of Health   Financial Resource Strain: Medium Risk (05/31/2022)   Received from [facility]   Overall Financial Resource Strain (CARDIA)    Difficulty of Paying Living Expenses: Somewhat hard  Food Insecurity: Food Insecurity Present (05/31/2022)   Received from [facility]  Hunger Vital Sign    Within the past 12 months, you worried that your food would run out before you got the money to buy more.: Sometimes true    Within the past 12 months, the food you bought just didn't last and you didn't have money to get more.: Sometimes true  Transportation Needs: No Transportation Needs (05/31/2022)   Received from [facility] - [facility]    Lack of Transportation (Medical): No    Lack of Transportation (Non-Medical): No    Family History  Problem Relation Name Age of Onset   Allergies Father Father, Paternal Grandparents    Anxiety Father Father, Paternal Grandparents        Self   Depression Father Father, Paternal Grandparents        Self   Diabetes Father Father, Paternal Grandparents        Self   Glaucoma Father Father, Paternal Grandparents    Hyperlipidemia (Elevated cholesterol) Father Father, Paternal Grandparents        Self   High blood pressure (Hypertension) Father Father, Paternal Grandparents    Stroke Father Father, Paternal Grandparents    Dementia Maternal Grandmother Barbarann Barbarann    Obesity Maternal Grandmother Barbarann Barbarann         Self   Osteoporosis (Thinning of bones) Maternal Grandmother Barbarann Barbarann    Anesthesia problems Neg Hx     Malignant hyperthermia Neg Hx     Malignant hypertension Neg Hx     Pseudochol deficiency Neg Hx     PONV Neg Hx     Post-Stroke Depression Screening PHQ9:  Patient Health Questionnaire-9 Score: 4 (improved)  Depression Severity and Treatment Recommendations: 0-4=None  Physical Exam Vitals:   03/04/24 1536  BP: 123/73  BP Location: Right upper arm  Patient Position: Sitting  BP Cuff Size: Large Adult  Pulse: 82  Weight: 100 kg (220 lb 7.4 oz)   Neurological Examination  Mental Status and Language: Alert and oriented to self, location, date. Speech was fluent and clear. Naming and repetition intact. Able to follow commands.  Cranial Nerves: Extraocular movements were intact. No sustained nystagmus noted. Facial sensation was intact to light touch in the V1 through V3 distributions bilaterally. No facial asymmetry was noted. Hearing was intact to conversation. Palate elevated symmetrically. Tongue was midline.  Motor: 5/5 strength in all 4 extremities though limited by pain.  Sensory: Intact to light touch in all four extremities. There was no neglect.   Coordination: Intact to finger-to-nose, heel-to-shin bilaterally.  Station and Gait: Able to stand without assistance. Walks with a cane.  NIHSS: 1a  Level of consciousness: 0=Alert; keenly responsive  1b. LOC questions:  0=Performs both tasks correctly  1c. LOC commands: 0=Performs both tasks correctly  2.  Best gaze: 0=Normal  3. Visual: 0=No visual loss  4. Facial palsy: 0=Normal symmetric movement  5a. Motor left arm: 0=No drift, limb holds 90 (or 45) degrees for full 10 seconds  5b.  Motor right arm: 0=No drift, limb holds 90 (or 45) degrees for full 10 seconds  6a. Motor left leg: 0=No drift, limb holds 90 (or 45) degrees for full 5 seconds  6b  Motor right leg:  0=No drift, limb holds 90 (or 45)  degrees for full 5 seconds  7. Limb ataxia: 0=Absent  8.  Sensory: 0=Normal; no sensory loss  9. Best language:  0=No aphasia, normal  10. Dysarthria: 0=Normal  11. Extinction and inattention: 0=No abnormality   Total:  0   mRS: 0 - No symptoms  Impression and Plan Karen Stein is a 53 y.o. right handed female with uncontrolled diabetes, hyperlipidemia, obesity, spinal cord stimulator, and bilateral intracranial stenosis who had right sided punctate infarcts concerning for a borderzone pattern versus embolic phenomenon back in February 2024. Her presentation at that time with left sided muscle cramping. She has residual fatigue from her stroke in February 2024. She then had another event in September 2024 with right sided muscle cramping. She continues to have episodes that she thinks she is having more strokes but is unable to describe the sensation, but she feels like it helps when she takes additional aspirin .  She still struggles with fatigue and short term memory, making it difficult for her to succeed while working. She has also on disability.  History of right cerebral punctate infarctions Late effect of stroke: fatigue Mild bilateral intracranial ICA stenosis -Etiology unclear, possibly large artery atherosclerosis, though her stenosis is rated as mild. She also carries an unclear diagnosis of atrial fibrillation. She said she was started on apixaban at one point, but it was unclear if she actually had atrial fibrillation or not. A cardiac event monitor was negative for atrial fibrillation when performed in 2024. -Continue aspirin  81 mg daily. She also takes additional aspirin  81 mg tablets on her own accord when she feels like she has a weird sensation in her head that she is concerned for another stroke. She feels like taking the additional aspirin  helps with these episodes. -Risk factor management as below.  Hyperlipidemia, uncontrolled -LDL goal < 70, last LDL was 195 today.   -Referral to the cardiometabolic clinic already placed, and she has an appointment in January 2026.  -Continue rosuvastatin 40 mg daily for now. Encouraged her to continue to take it more consistently. Encouraged her to look into getting a pillbox to help organize her medications and to help her remember if she has taken her medication or not.  Type II diabetes, uncontrolled -Hemoglobin A1c goal < 7.0%, last A1c was 13.1% in September 2025. -She needs to continue working with her endocrinology team and PCP to get the right regimen for her and one that she can afford with her insurance. She says she is working with her insurance company to get her meds funded. -Follow up with PCP and endocrinology.  I have gone over the pathophysiology of stroke, warning signs and symptoms, risk factors and their management in some detail with instructions to go to the closest emergency room for symptoms of concern. Target LDL should be less than 70, systolic blood pressure less than 130 and hemoglobin A1c less than 7.0%. We discussed the benefits of 30 to 45 minutes of moderate exercise 5 times weekly as well as maintaining a healthy diet.  I spent a total of 40 minutes in both face-to-face and non-face-to-face activities, excluding procedures performed, for this visit on the date of this encounter.  Follow up in five months as requested by the patient.  Attestation Statement:   I personally performed the service. (TP)  SCOTT MAYHUGH LE, DO Department of Neurology Division of Stroke and Vascular Neurology 03/04/2024 4:20 PM

## 2024-03-18 NOTE — Progress Notes (Signed)
 [facility]  Subjective Patient ID: Karen Stein is a 53 y.o.  female.   HPI Patient presents to clinic today for type 2 diabetes. Last visit was 01/02/24.   Type II DM  Diagnosed: >20 years ago Current meds: Lantus  20 units nightly (started at last visit), Humalog  10+2 units TID AC per sliding scale. She states that she confused the two insulins, so she has been taking humalog  20 units nightly and Lantus  before meals.   Symptoms: polyuria (yes), polydipsia (yes), unintentional weight loss/gain (no), N/V (yes, nausea), neuropathy (yes), vision changes (no). Home blood glucose readings: She has checked her blood sugar 14 times over the last 1 month.  She states these are mostly after eating.  Blood sugar is 50% very high, 43% high, 7% target range, 0% low, and 0% very low.  Average blood sugar is 272.  Max blood sugar is 435 and minimum blood sugar is 174.  Glucose variability is 29.2%. Hypoglycemia: denies  Prior meds: Xultophy, NovoLog , Ozempic (nausea/vomiting), glipizide, Lantus , OmniPod Dash, Jardiance (recurrent yeast infections), metformin (GI upset), OmniPod 5 (stopped due to no access to wifi)   Diabetes education: She has had diabetes education before.   She states that she was on steroids recently for 1 week.  She states that she is currently living in a residence that does not have WiFi. She states that this makes it difficult for her phone to connect to blue tooth of her pump and Dexcom and for the app to work, so she was not able to use them.   Hemoglobin A1c is 10.5%, down from 13.1%.    Diet: She is working on low carb diet.  Exercise: none. She is limited due to her fibromyalgia.  Weight: increased    Health Maintenance/Complications:  Last eye exam: over 1 year ago; due  Last microalbumin urine: 08/08/23-normal  Last foot exam: 05/22/23 Other Complications: History of two CVAs, gastroparesis ACEi/ARB: no  Statin: yes,  rosuvastatin daily. She has hyperlipidemia.   LDL-195, triglycerides-134, HDL-35, total cholesterol- 260 on 08/29/2023.  Review of Systems  Constitutional:  Negative for unexpected weight change.  Eyes:  Negative for visual disturbance.  Gastrointestinal:  Positive for nausea. Negative for vomiting.  Endocrine: Positive for polydipsia and polyuria.  Neurological:  Positive for numbness.    Allergies[1]  Current Medications[2]  Patient Active Problem List   Diagnosis Date Noted   Severe obesity (BMI 35.0-39.9) with comorbidity (CMS/HCC) 08/08/2023   History of recurrent UTIs 05/11/2022   Peroneal tendonitis of right lower leg 12/07/2021   Hammertoes of both feet 05/17/2021   Tinea pedis of both feet 05/17/2021   Asthma 05/09/2021   Polycystic ovary syndrome 05/09/2021   Cerebral atherosclerosis 05/09/2021   Uterine leiomyoma 05/09/2021   Endometriosis 05/09/2021   Onychomycosis due to dermatophyte 01/13/2021   Blister of toe of right foot without infection 01/13/2021   Gastroparesis 08/02/2020   Unsteady gait 08/02/2020   Vitamin D deficiency 08/02/2020   DDD (degenerative disc disease), lumbar 08/02/2020   Anxiety 09/03/2019   Lacunar infarction (HCC) 09/03/2019   OSA on CPAP 09/03/2019   Elevated blood protein 07/31/2018   HLD (hyperlipidemia) 07/31/2018   Prolonged QT interval 07/31/2018   Macromastia 12/20/2017   Severe obesity (BMI 35.0-35.9 with comorbidity) (HCC) 12/20/2017   Degenerative scoliosis 11/17/2017   Fibromyalgia 11/17/2017   Lumbar post-laminectomy syndrome 11/17/2017   Pain in thoracic spine 11/17/2017   Depression 08/25/2017   DDD (degenerative disc disease) 03/27/2016  Sickle cell trait (HCC) 03/27/2016   Type 2 diabetes mellitus with hyperglycemia (HCC) 03/27/2016   Major depressive disorder, 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  Normal appearance. HEENT: Normocephalic and atraumatic. Cardiovascular: normal rate and regular rhythm.  Normal heart sounds.  No murmur heard. Pulmonary: Pulmonary effort is normal.  No respiratory distress.  Normal breath sounds.  No wheezing, rhonchi, or rales. Neurological: Alert.    Assessment/Plan 1. Type 2 diabetes mellitus with hyperglycemia, with long-term current use of insulin     (CMD) (Primary) Hemoglobin A1c has improved to 10.5% but is still above goal. Educated patient on the different types of insulin .  Discussed with patient that Lantus  is long-acting and is taken once nightly and Humalog  if short acting and is taken before meals. Increase Lantus  to 24 units nightly, and then increase by 2 units every 3 nights until fasting blood sugar is less than 130.  If you start having lows, decrease by 2 units. Increase Humalog  to 12+2 units 3 times daily AC per sliding scale as seen below. Advised to monitor blood sugar fasting in the morning and 2 hours after eating.  Reviewed goals for fasting blood sugar is less than 120 and goals for 2 hours after eating is less than 180. Work on [employer]. Increase physical activity. Continue to monitor blood sugar. Have fasting labs done soon. - POC Glucose (Hemocue/NOVA) - POC Hemoglobin A1c - glucose blood (Accu-Chek Guide test strips) test strip; Use accuchek guide test strip to check blood sugar 4 times daily for E11.65 on insulin   Dispense: 400 each; Refill: 3 - insulin  glargine (LANTUS  SoloStar) 100 unit/mL (3 mL) pen; Inject 60 units nightly  Dispense: 60 mL; Refill: 1 - insulin  lispro (HumaLOG  KwikPen) 100 unit/mL KwikPen; Inject TID AC. Max daily dose is 100 units.  Dispense: 90 mL; Refill: 1 - Lancets (Accu-Chek Softclix Lancets) misc; Use accuchek  guide lancets to check blood sugar 4 times daily for E11.65 on insulin   Dispense: 400 each; Refill: 3  2. Hyperlipidemia associated with type 2 diabetes mellitus    (CMD) Continue statin.  Return in 3 months.     Blood Glucose (mg/dL)  Breakfast  (Units Humalog  Insulin )  Lunch  (Units Humalog  Insulin )  Supper  (Units Humalog  Insulin )  Nighttime (Units Humalog  Insulin )   <70 Treat the low blood sugar.  Recheck blood glucose  in 15 mins. If sugar is more than 70, then take the number of units of insulin  in the 70-90 row, if before a meal.   70-90   7   7   7   0  91-130 (Base Dose)  12  12  12   0   131-150  14  14  14   0   151-200  16  16  16   0   201-250  18  18  18  0   251-300  20  20  20  0   301-350  22  22  22  0   351-400  24  24  24   0   401-450  26  26  26  0   >450  28  28  28   0         [  1] Allergies Allergen Reactions   Nitrofurantoin Monohyd/M-Cryst Itching   Shellfish Derived Itching   Sulfa (Sulfonamide Antibiotics) Itching  [2]  Current Outpatient Medications:    albuterol  HFA (PROVENTIL  HFA;VENTOLIN  HFA;PROAIR  HFA) 90 mcg/actuation inhaler, Inhale 2 puffs every 4 (four) hours as needed for wheezing or shortness of breath., Disp: 1 each, Rfl: 0   blood-glucose meter (Accu-Chek Guide Glucose Meter) misc, Use accuchek guide glucometer to check blood sugar 4 times daily for E11.65 on insulin , Disp: 1 each, Rfl: 0   blood-glucose sensor (Dexcom G6 Sensor), Use one sensor every 10 days for monitoring blood glucose., Disp: 3 each, Rfl: 11   ciclopirox (PENLAC) 8 % solution, Apply topically nightly., Disp: 6.6 mL, Rfl: 5   clotrimazole-betamethasone (LOTRISONE) 1-0.05 % cream, Apply small amount to the bottom of both feet twice daily, Disp: 45 g, Rfl: 5   dexAMETHasone  (DECADRON ) 6 mg tablet, Take 1 tablet (6 mg total) by mouth daily with breakfast for 5 days., Disp: 5 tablet, Rfl: 0   Dexcom G6 Receiver  misc, 1 each by miscellaneous route continuous., Disp: 1 each, Rfl: 0   Dexcom G6 Transmitter, 1 each by miscellaneous route every 3 (three) months., Disp: 1 each, Rfl: 3   glucose blood (Accu-Chek Guide test strips) test strip, Use accuchek guide test strip to check blood sugar 4 times daily for E11.65 on insulin , Disp: 400 each, Rfl: 3   glucose monitoring kit kit, Needs 1 Accu-Chek SoftClix lancing device for E11.65, Disp: 1 each, Rfl: 0   insulin  glargine (LANTUS  SoloStar) 100 unit/mL (3 mL) pen, Inject 60 units nightly, Disp: 60 mL, Rfl: 1   insulin  lispro (HumaLOG  KwikPen) 100 unit/mL KwikPen, Inject 15 units TID AC. Max daily dose is 50 units., Disp: 45 mL, Rfl: 1   insulin  lispro (HumaLOG ) 100 unit/mL injection, Use in insulin  pump. Max daily dose is 150 units., Disp: 150 mL, Rfl: 1   insulin  pump cartridge automated dosing (Omnipod 5 G6-G7 Pods, Gen 5,) crtg subcutaneous cartridge, Change pods every 3 days for insulin  administration, Disp: 10 each, Rfl: 5   Lancets (Accu-Chek Softclix Lancets) misc, Use accuchek guide lancets to check blood sugar 4 times daily for E11.65 on insulin , Disp: 400 each, Rfl: 3   lidocaine  (LIDODERM ) 5 % patch, , Disp: , Rfl:    naproxen  (NAPROSYN ) 500 mg tablet, Take 1 tablet (500 mg total) by mouth in the morning and 1 tablet (500 mg total) in the evening. Take with meals. Do all this for 7 days., Disp: 14 tablet, Rfl: 0   ondansetron  (ZOFRAN -ODT) 4 mg disintegrating tablet, Dissolve 1 tablet (4 mg total) on tongue every 8 (eight) hours as needed for nausea or vomiting., Disp: 20 tablet, Rfl: 2   pen needle, diabetic (Novofine 32) 32 gauge x 1/4 ndle, Inject insulin  once daily, Disp: 100 each, Rfl: 2   pregabalin  (LYRICA ) 150 mg capsule, TAKE 1 CAPSULE BY MOUTH THREE TIMES DAILY, Disp: , Rfl:    promethazine  (PHENERGAN ) 25 mg suppository, Insert 25 mg into the rectum every 6 (six) hours as needed for nausea., Disp: 24 each, Rfl: 0   rosuvastatin  (CRESTOR) 20 mg tablet, Take 20 mg by mouth Once Daily., Disp: 90 tablet, Rfl: 0   valACYclovir  (VALTREX ) 500 mg tablet, Take 500 mg by mouth., Disp: , Rfl:  [3] Family History Problem Relation Name Age of Onset   Breast cancer Paternal Aunt    [4] Past Surgical History: Procedure Laterality Date   BREAST BIOPSY  Procedure: BREAST BIOPSY   CESAREAN SECTION, UNSPECIFIED     Procedure: CESAREAN SECTION   DIAGNOSTIC LAPAROSCOPY     Procedure: LAPAROSCOPY   DILATION AND CURETTAGE, DIAGNOSTIC / THERAPEUTIC     Procedure: DILATION AND CURETTAGE, DIAGNOSTIC / THERAPEUTIC   SPINE SURGERY     Procedure: SPINE SURGERY   TOE SURGERY     Procedure: TOE SURGERY   WISDOM TOOTH EXTRACTION     Procedure: WISDOM TOOTH EXTRACTION  [5] Social History Socioeconomic History   Marital status: Married  Tobacco Use   Smoking status: Never   Smokeless tobacco: Never   Social Drivers of Architectural Technologist Insecurity: Food Insecurity Present (05/31/2022)   Received from [facility]   Food vital sign    Within the past 12 months, you worried that your food would run out before you got money to buy more: Sometimes true    Within the past 12 months, the food you bought just didn't last and you didn't have money to get more: Sometimes true  Transportation Needs: No Transportation Needs (05/31/2022)   Received from [facility] - Transportation    Lack of Transportation (Medical): No    Lack of Transportation (Non-Medical): No  Safety: Not At Risk (05/31/2022)   Received from [facility]   Safety    Within the last year, have you been afraid of your partner or ex-partner?: No    Within the last year, have you been humiliated or emotionally abused in other ways by your partner or ex-partner?: No    Within the last year, have you been kicked, hit, slapped, or otherwise physically hurt by your partner or ex-partner?: No    Within the last year, have you been raped or forced to  have any kind of sexual activity by your partner or ex-partner?: No  Living Situation: Low Risk  (03/19/2024)   Living Situation    What is your living situation today?: I have a steady place to live    Think about the place you live. Do you have problems with any of the following? Choose all that apply:: None/None on this list

## 2024-03-24 ENCOUNTER — Other Ambulatory Visit (HOSPITAL_BASED_OUTPATIENT_CLINIC_OR_DEPARTMENT_OTHER): Payer: Self-pay

## 2024-03-25 ENCOUNTER — Other Ambulatory Visit (HOSPITAL_BASED_OUTPATIENT_CLINIC_OR_DEPARTMENT_OTHER): Payer: Self-pay

## 2024-03-25 ENCOUNTER — Other Ambulatory Visit: Payer: Self-pay

## 2024-03-26 ENCOUNTER — Other Ambulatory Visit (HOSPITAL_BASED_OUTPATIENT_CLINIC_OR_DEPARTMENT_OTHER): Payer: Self-pay

## 2024-05-07 ENCOUNTER — Other Ambulatory Visit (HOSPITAL_BASED_OUTPATIENT_CLINIC_OR_DEPARTMENT_OTHER): Payer: Self-pay

## 2024-05-07 MED ORDER — BELBUCA 600 MCG BU FILM
1.0000 | ORAL_FILM | Freq: Two times a day (BID) | BUCCAL | 0 refills | Status: AC
  Filled 2024-05-07: qty 60, 30d supply, fill #0

## 2024-05-07 MED ORDER — DULOXETINE HCL 60 MG PO CPEP
60.0000 mg | ORAL_CAPSULE | Freq: Every day | ORAL | 5 refills | Status: AC
  Filled 2024-05-07: qty 30, 30d supply, fill #0

## 2024-05-07 MED ORDER — PREGABALIN 200 MG PO CAPS
200.0000 mg | ORAL_CAPSULE | Freq: Three times a day (TID) | ORAL | 5 refills | Status: AC
  Filled 2024-05-07: qty 90, 30d supply, fill #0

## 2024-05-19 ENCOUNTER — Other Ambulatory Visit (HOSPITAL_BASED_OUTPATIENT_CLINIC_OR_DEPARTMENT_OTHER): Payer: Self-pay

## 2024-05-23 ENCOUNTER — Encounter (HOSPITAL_COMMUNITY): Payer: Self-pay

## 2024-05-23 ENCOUNTER — Other Ambulatory Visit: Payer: Self-pay

## 2024-05-23 ENCOUNTER — Emergency Department (HOSPITAL_COMMUNITY)
Admission: EM | Admit: 2024-05-23 | Discharge: 2024-05-24 | Disposition: A | Attending: Emergency Medicine | Admitting: Emergency Medicine

## 2024-05-23 DIAGNOSIS — R112 Nausea with vomiting, unspecified: Secondary | ICD-10-CM | POA: Diagnosis present

## 2024-05-23 DIAGNOSIS — F1193 Opioid use, unspecified with withdrawal: Secondary | ICD-10-CM

## 2024-05-23 DIAGNOSIS — R109 Unspecified abdominal pain: Secondary | ICD-10-CM | POA: Diagnosis not present

## 2024-05-23 LAB — COMPREHENSIVE METABOLIC PANEL WITH GFR
ALT: 9 U/L (ref 0–44)
AST: 20 U/L (ref 15–41)
Albumin: 4.2 g/dL (ref 3.5–5.0)
Alkaline Phosphatase: 100 U/L (ref 38–126)
Anion gap: 16 — ABNORMAL HIGH (ref 5–15)
BUN: 12 mg/dL (ref 6–20)
CO2: 20 mmol/L — ABNORMAL LOW (ref 22–32)
Calcium: 9.5 mg/dL (ref 8.9–10.3)
Chloride: 100 mmol/L (ref 98–111)
Creatinine, Ser: 0.93 mg/dL (ref 0.44–1.00)
GFR, Estimated: >60 mL/min
Glucose, Bld: 295 mg/dL — ABNORMAL HIGH (ref 70–99)
Potassium: 4.3 mmol/L (ref 3.5–5.1)
Sodium: 135 mmol/L (ref 135–145)
Total Bilirubin: 0.7 mg/dL (ref 0.0–1.2)
Total Protein: 8.3 g/dL — ABNORMAL HIGH (ref 6.5–8.1)

## 2024-05-23 LAB — URINALYSIS, ROUTINE W REFLEX MICROSCOPIC
Bilirubin Urine: NEGATIVE
Glucose, UA: >=500 mg/dL — AB
Ketones, ur: 20 mg/dL — AB
Leukocytes,Ua: NEGATIVE
Nitrite: NEGATIVE
Protein, ur: 30 mg/dL — AB
Specific Gravity, Urine: 1.014 (ref 1.005–1.030)
pH: 5 (ref 5.0–8.0)

## 2024-05-23 LAB — CBC WITH DIFFERENTIAL/PLATELET
Abs Immature Granulocytes: 0.04 K/uL (ref 0.00–0.07)
Basophils Absolute: 0 K/uL (ref 0.0–0.1)
Basophils Relative: 0 %
Eosinophils Absolute: 0 K/uL (ref 0.0–0.5)
Eosinophils Relative: 0 %
HCT: 44.6 % (ref 36.0–46.0)
Hemoglobin: 15.6 g/dL — ABNORMAL HIGH (ref 12.0–15.0)
Immature Granulocytes: 0 %
Lymphocytes Relative: 12 %
Lymphs Abs: 1.1 K/uL (ref 0.7–4.0)
MCH: 29.8 pg (ref 26.0–34.0)
MCHC: 35 g/dL (ref 30.0–36.0)
MCV: 85.3 fL (ref 80.0–100.0)
Monocytes Absolute: 0.2 K/uL (ref 0.1–1.0)
Monocytes Relative: 2 %
Neutro Abs: 7.8 K/uL — ABNORMAL HIGH (ref 1.7–7.7)
Neutrophils Relative %: 86 %
Platelets: 329 K/uL (ref 150–400)
RBC: 5.23 MIL/uL — ABNORMAL HIGH (ref 3.87–5.11)
RDW: 12 % (ref 11.5–15.5)
WBC: 9.1 K/uL (ref 4.0–10.5)
nRBC: 0 % (ref 0.0–0.2)

## 2024-05-23 LAB — LIPASE, BLOOD: Lipase: 12 U/L (ref 11–51)

## 2024-05-23 MED ORDER — LACTATED RINGERS IV BOLUS
1000.0000 mL | Freq: Once | INTRAVENOUS | Status: AC
  Administered 2024-05-24: 1000 mL via INTRAVENOUS

## 2024-05-23 MED ORDER — ONDANSETRON HCL 4 MG/2ML IJ SOLN
4.0000 mg | Freq: Once | INTRAMUSCULAR | Status: AC
  Administered 2024-05-24: 4 mg via INTRAVENOUS
  Filled 2024-05-23: qty 2

## 2024-05-23 MED ORDER — CLONIDINE HCL 0.1 MG PO TABS
0.1000 mg | ORAL_TABLET | Freq: Once | ORAL | Status: AC
  Administered 2024-05-24: 0.1 mg via ORAL
  Filled 2024-05-23: qty 1

## 2024-05-23 MED ORDER — BUPRENORPHINE HCL-NALOXONE HCL 8-2 MG SL SUBL: 1.0000 | SUBLINGUAL_TABLET | Freq: Once | SUBLINGUAL | Status: DC

## 2024-05-23 MED ORDER — LORAZEPAM 1 MG PO TABS
1.0000 mg | ORAL_TABLET | Freq: Once | ORAL | Status: AC
  Administered 2024-05-24: 1 mg via ORAL
  Filled 2024-05-23: qty 1

## 2024-05-23 NOTE — ED Triage Notes (Signed)
 Pt to ED via [facility] from doctors office c/o N/V x 2 days.  Reports stopped taking suboxene 2 days ago, and that's when symptoms started. Apparently the provider was increasing the dose, pt did not want to take increased dose, so stopped cold turkey . Pt denies CP, Reports abdominal pain. 4mg  Zofran - given at doctors office.  Last VS: 190/90, hr 66, rr 18, 99%. 98.4, cbg 288.

## 2024-05-23 NOTE — ED Provider Triage Note (Signed)
 Emergency Medicine Provider Triage Evaluation Note  TAMAR MIANO , a 54 y.o. female  was evaluated in triage.  Pt complains of persistent nausea and vomiting for the past 3 days.  States that she discontinued her buprenorphine  she is unsure whether she is withdrawing.  Does admit to cannabis use.  Not complaining of abdominal pain.  Review of Systems  Positive:  Negative:   Physical Exam  BP (!) 202/78 (BP Location: Right Arm)   Pulse 66   Temp 99.1 F (37.3 C) (Oral)   Resp 19   Ht 5' 9 (1.753 m)   Wt 100.2 kg   LMP 11/13/2015   SpO2 100%   BMI 32.64 kg/m  Gen:   Awake, no distress   Resp:  Normal effort  MSK:   Moves extremities without difficulty  Other:    Medical Decision Making  Medically screening exam initiated at 6:04 PM.  Appropriate orders placed.  HAELI GERLICH was informed that the remainder of the evaluation will be completed by another provider, this initial triage assessment does not replace that evaluation, and the importance of remaining in the ED until their evaluation is complete.  Will obtain routine labs to evaluate for electrolytes   Donnajean Lynwood DEL, PA-C 05/23/24 1805

## 2024-05-23 NOTE — Progress Notes (Signed)
 [id number]

## 2024-05-23 NOTE — Progress Notes (Signed)
 [address] - AMBULATORY [facility] [address] [place] [address]   Date of Service: 05/23/2024 Patient DOB: 04/06/1971    History of Present Illness   Patient ID: Karen Stein is a 54 y.o. female. Patient with PMH of CVA, HTN, HLD, Dm type 2 on insulin  presents to urgent care due to n/v, abd pain x 2 days.  Patient states that she has started to experience nausea and vomiting approximately 2 days ago with progressive worsening.  Patient states that she believes symptoms are secondary to buprenorphine , but denies dose change or medication changes.  Denies cardiac history.  Denies chest pain, shortness of breath, dizziness, weakness, numbness, tingling.  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   Lumbar post-laminectomy syndrome 11/17/2017   Pain in thoracic spine 11/17/2017   Sickle cell trait (HCC) 03/27/2016   Severe obesity (BMI 35.0-35.9 with comorbidity) (HCC) 12/20/2017   Anxiety 09/03/2019   Depression 08/25/2017   Elevated blood protein 07/31/2018   HLD (hyperlipidemia) 07/31/2018   Lacunar infarction (HCC) 09/03/2019   OSA on CPAP 09/03/2019   Prolonged QT interval 07/31/2018   Onychomycosis due to dermatophyte 01/13/2021   Blister of toe of right foot without infection 01/13/2021   Asthma 05/09/2021   Polycystic ovary syndrome 05/09/2021   Gastroparesis 08/02/2020   Cerebral atherosclerosis 05/09/2021   Type 2 diabetes mellitus with hyperglycemia (HCC) 03/27/2016   Uterine leiomyoma 05/09/2021   Unsteady gait 08/02/2020   Vitamin D deficiency 08/02/2020   DDD (degenerative disc disease), lumbar 08/02/2020    Endometriosis 05/09/2021   Hammertoes of both feet 05/17/2021   Tinea pedis of both feet 05/17/2021   Peroneal tendonitis of right lower leg 12/07/2021   History of recurrent UTIs 05/11/2022   Severe obesity (BMI 35.0-39.9) with comorbidity (CMD) 08/08/2023   Resolved Ambulatory Problems    Diagnosis Date Noted   No Resolved Ambulatory Problems   Past Medical History:  Diagnosis Date   Class 3 severe obesity due to excess calories without serious comorbidity with body mass index (BMI) of 40.0 to 44.9 in adult (CMD) 12/20/2017   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     Physicial Exam   Physical Exam Vitals and nursing note reviewed.  Constitutional:      Appearance: Normal appearance. She is normal weight. She is ill-appearing.  HENT:     Head: Normocephalic and atraumatic.     Right Ear: External ear normal.     Left Ear: External ear normal.     Nose: Nose normal.     Mouth/Throat:     Mouth: Mucous membranes are moist.     Pharynx: Oropharynx is clear.  Eyes:     Extraocular Movements: Extraocular movements intact.     Conjunctiva/sclera: Conjunctivae normal.  Cardiovascular:     Rate and Rhythm: Normal rate and regular rhythm.     Pulses: Normal pulses.     Heart sounds: Normal heart sounds.  Pulmonary:     Effort: Pulmonary effort is normal.     Breath sounds: Normal breath sounds.  Abdominal:     General: Abdomen is flat. Bowel sounds are normal. There is no distension.     Palpations: Abdomen is soft. There is no mass.     Tenderness: There is abdominal tenderness  (epigastric). There is no guarding or rebound.     Hernia: No hernia is present.     Comments: Pt vomiting during encounter  Musculoskeletal:        General: Normal range of motion.     Cervical back: Normal range of motion and neck supple.  Skin:    General: Skin is warm and dry.  Neurological:     Mental Status: She is alert and oriented to person, place, and time.  Psychiatric:        Mood and Affect: Mood normal.      Labs   Recent Results (from the past 24 hours)  POC Glucose   Collection Time: 05/23/24  4:28 PM  Result Value Ref Range   Glucose, POC 272 (H) 70 - 99 mg/dL     Diagnosis   Karen Stein was seen today for vomiting.  Diagnoses and all orders for this visit:  Nausea and vomiting, unspecified vomiting type -     ondansetron  (ZOFRAN -ODT) disintegrating tablet 4 mg -     ECG 12 lead -     sodium chloride  (bolus) 0.9 % bolus 1,000 mL  Epigastric pain -     ECG 12 lead -     sodium chloride  (bolus) 0.9 % bolus 1,000 mL -     Discontinue: aspirin  tablet 325 mg -     aspirin  chewable tablet 324 mg  Other orders -     POC Glucose     Medical Decision Making Karen Stein is a 54 y.o. female who presents to urgent care today with complaints of  Chief Complaint  Patient presents with   Vomiting    Patient states she has been vomiting and feeling like she is having a stroke or an overdose.     On exam, VS stable and patient is afebrile. Appears well-hydrated and capillary refill <2 seconds.  DDx: ACS, aortic dissection, appendicitis, bowel obstruction, AAA, UTI, pyelonephritis, nephrolithiasis, pancreatitis, cholecystitis, shingles, perforated bowel or ulcer, diverticulitis, mesenteric ischemia, inflammatory bowel disease, or strangulated/incarcerated hernia.  Patient presents to urgent care due to nausea, vomiting, epigastric pain. POC glucose 272. On exam, patient appears to have significant epigastric tenderness along with active vomiting.  Zofran   given in clinic.  EKG significant for NSR, RBBB, bifascicular block. Pt alert and oriented and hemodynamically stable at this time.  Patient given ASA and IV bolus. EMS called.   Discharge Information  Symptomatic management discussed.  Patient was given verbal and written instructions on symptoms that necessitate return to the UC/ED, and instructed to f/u w/ UC or PCP if not improving in expected timeframe.   Patient/parent has been instructed on RX/OTC medications, dosages, side effects, and possible interactions as associated with each diagnosis in my impression and plan above.   Patient education (verbal/handout) given on diagnosis, pathophysiology, treatment of diagnosis, side effects of medication use for treatment, restrictions while taking medication, supportives measures such as staying hydrated.   Red Flags associated with  diagnosis/es were reviewed and patient instructed on action plan if red flags develop.   They have been instructed that if symptoms worsen or red flags develop they should return to Urgent Care, go to the nearest ED, or activate EMS/911.     Patient and/or parent/guardian (if applicable) agreed with plan and voiced understanding.  No barriers to adherence perceived by myself.   Portions of this note may have been dictated using Dragon dictation software/hardware and may contain grammatical or spelling errors.    If a new prescription was given today, then I discussed potential side effects, drug interactions, instructions for taking the medication, and the consequences of not taking it.    F/u: Follow up closely with primary care provider (PCP) and other specialists for further care and routine care, but seek medical attention sooner if worsening/concerning signs or symptoms.  Follow up with ED   Electronically signed by: Darryle Slater Fish, PA-C 05/23/2024 5:05 PM

## 2024-05-23 NOTE — ED Notes (Signed)
 Iv team consult placed. Unable to start line on pt.

## 2024-05-23 NOTE — ED Notes (Addendum)
 Per Donnajean PA- Pt ok for lobby

## 2024-05-23 NOTE — ED Notes (Signed)
 This RN reached out to lab regarding inprocess CBC; informed hemolyzed, phlebotomy to recollect

## 2024-05-23 NOTE — ED Provider Notes (Signed)
 " [facility] Provider Note   CSN: [id number] Arrival date & time: 05/23/24  1725     Patient presents with: Withdrawal, Nausea, and Emesis   Karen Stein is a 54 y.o. female who presents today for evaluation of abdominal pain, nausea and vomiting.  Patient reports that she has a history of fibromyalgia and was previously using buprenorphine  patches.  PCP was trying to uptitrate her medications however she had insurance issues that would not cover sublingual strips.  Patient was provided buprenorphine  tablets and upon taking this had concerns of potential overdose and subsequently stopped taking this altogether starting around Wednesday.  Patient has previously been on buprenorphine  for the past year.  Since stopping the medication she has had symptoms concerning for withdrawal including abdominal pain, nausea, vomiting.  Due to ongoing symptoms presenting today for further evaluation.    Emesis      Prior to Admission medications  Medication Sig Start Date End Date Taking? Authorizing Provider  albuterol  (VENTOLIN  HFA) 108 (90 Base) MCG/ACT inhaler Inhale 1-2 puffs into the lungs every 6 (six) hours as needed for wheezing or shortness of breath. 04/29/22   Hazen Darryle BRAVO, FNP  atorvastatin  (LIPITOR) 40 MG tablet Take 40 mg by mouth daily. 08/26/22   [provider]  buprenorphine  (BUTRANS ) 20 MCG/HR PTWK Place 1 patch onto the skin once a week. 10/08/23     buprenorphine  (BUTRANS ) 20 MCG/HR PTWK Place 1 patch onto the skin every 7 (seven) days. 11/07/23     buprenorphine  (BUTRANS ) 20 MCG/HR PTWK Place 1 patch onto the skin once a week. 02/06/24     Buprenorphine  HCl (BELBUCA ) 600 MCG FILM Place 1 Film (600 mcg total) inside cheek every 12 (twelve) hours. 05/07/24     ciclopirox (PENLAC) 8 % solution Apply 1 Application topically daily as needed (Skin infection). 01/13/21   [provider]  clopidogrel  (PLAVIX ) 75 MG tablet Take 1  tablet (75 mg total) by mouth daily. 06/02/22 08/31/22  Arlice Reichert, MD  Continuous Glucose Sensor (DEXCOM G6 SENSOR) MISC Use one sensor every 10 days for monitoring blood glucose. 10/09/23     Continuous Glucose Transmitter (DEXCOM G6 TRANSMITTER) MISC USE AS DIRECTED EVERY 3 MONTHS    [provider]  Continuous Glucose Transmitter (DEXCOM G6 TRANSMITTER) MISC Use as directed. Change ever 3 months as directed 10/09/23     diclofenac  Sodium (VOLTAREN ) 1 % GEL Apply 4 g topically 4 (four) times daily as needed (back pain). 05/17/23   Randol Simmonds, MD  DULoxetine  (CYMBALTA ) 60 MG capsule Take 1 capsule (60 mg total) by mouth daily. 05/07/24     insulin  aspart (NOVOLOG ) 100 UNIT/ML injection Inject 5-16 Units into the skin with breakfast, with lunch, and with evening meal. Per sliding scale 05/17/23   Randol Simmonds, MD  Insulin  Disposable Pump (OMNIPOD 5 [id number] PODS GEN 5) MISC Change pods every 3 days for insulin  administration 10/09/23     insulin  lispro (HUMALOG ) 100 UNIT/ML injection Use in insulin  pump. Max daily dose of 150 units 10/09/23     JARDIANCE 10 MG TABS tablet Take 1 tablet by mouth every morning.    [provider]  lidocaine  (LIDODERM ) 5 % Place 1 patch onto the skin daily. Remove & Discard patch within 12 hours or as directed by MD Patient taking differently: Place 1 patch onto the skin daily as needed (For pain). 08/23/19   Palumbo, April, MD  methocarbamol  (ROBAXIN ) 500 MG tablet  Take 1 tablet (500 mg total) by mouth 2 (two) times daily. 05/17/23   Randol Simmonds, MD  ondansetron  (ZOFRAN -ODT) 4 MG disintegrating tablet Take 1 tablet (4 mg total) by mouth every 8 (eight) hours as needed for nausea or vomiting. 03/28/22   Vicky Charleston, PA-C  ondansetron  (ZOFRAN -ODT) 4 MG disintegrating tablet Dissolve 1 tablet (4 mg total) by mouth every 8 (eight) hours as needed for nausea or vomiting. 10/09/23     Oxycodone  HCl 10 MG TABS Take 10 mg by mouth every 6 (six) hours. 11/07/19   [provider]  pregabalin  (LYRICA ) 200 MG capsule Take 200 mg by mouth 3 (three) times daily. 01/27/21   [provider]  pregabalin  (LYRICA ) 200 MG capsule Take 1 capsule (200 mg total) by mouth 3 (three) times daily. 05/07/24     Semaglutide,0.25 or 0.5MG /DOS, (OZEMPIC, 0.25 OR 0.5 MG/DOSE,) 2 MG/1.5ML SOPN Inject 0.4 mLs into the skin once a week. 09/11/19   [provider]  tiZANidine  (ZANAFLEX ) 4 MG tablet Take 4 mg by mouth every 8 (eight) hours as needed.    [provider]  valACYclovir  (VALTREX ) 500 MG tablet Take 500 mg by mouth daily.    [provider]    Allergies: Amoxicillin , Amoxicillin -pot clavulanate, Macrobid [nitrofurantoin macrocrystal], Shellfish allergy, Sulfa antibiotics, and Nitrofurantoin    Review of Systems  Gastrointestinal:  Positive for vomiting.    Updated Vital Signs BP (!) 191/94 (BP Location: Right Arm)   Pulse 80   Temp 99.2 F (37.3 C) (Oral)   Resp 18   Ht 5' 9 (1.753 m)   Wt 100.2 kg   LMP 11/13/2015   SpO2 98%   BMI 32.64 kg/m   Physical Exam Constitutional:      Appearance: She is ill-appearing.  HENT:     Head: Normocephalic.     Right Ear: External ear normal.     Left Ear: External ear normal.     Nose: Nose normal.     Mouth/Throat:     Mouth: Mucous membranes are moist.  Eyes:     Pupils: Pupils are equal, round, and reactive to light.  Cardiovascular:     Rate and Rhythm: Normal rate.  Pulmonary:     Effort: Pulmonary effort is normal.  Abdominal:     General: Abdomen is flat.     Tenderness: There is abdominal tenderness. There is no guarding.  Musculoskeletal:        General: Normal range of motion.  Skin:    General: Skin is warm.     Capillary Refill: Capillary refill takes less than 2 seconds.  Neurological:     General: No focal deficit present.     Mental Status: She is alert.  Psychiatric:        Mood and Affect: Mood normal.     (all labs ordered are listed, but only  abnormal results are displayed) Labs Reviewed  COMPREHENSIVE METABOLIC PANEL WITH GFR - Abnormal; Notable for the following components:      Result Value   CO2 20 (*)    Glucose, Bld 295 (*)    Total Protein 8.3 (*)    Anion gap 16 (*)    All other components within normal limits  URINALYSIS, ROUTINE W REFLEX MICROSCOPIC - Abnormal; Notable for the following components:   APPearance HAZY (*)    Glucose, UA >=500 (*)    Hgb urine dipstick SMALL (*)    Ketones, ur 20 (*)  Protein, ur 30 (*)    Bacteria, UA RARE (*)    All other components within normal limits  CBC WITH DIFFERENTIAL/PLATELET - Abnormal; Notable for the following components:   RBC 5.23 (*)    Hemoglobin 15.6 (*)    Neutro Abs 7.8 (*)    All other components within normal limits  LIPASE, BLOOD  CBC WITH DIFFERENTIAL/PLATELET    EKG: None  Radiology: No results found.   Procedures   Medications Ordered in the ED - No data to display                               Medical Decision Making Amount and/or Complexity of Data Reviewed Radiology: ordered.  Risk Prescription drug management.   Patient is a 54 year old female presenting today for potential opiate withdrawal.  On initial assessment patient was noted to be hypertensive but otherwise hemodynamically stable and afebrile.  On my bedside assessment patient is uncomfortable with active vomiting.  Diffuse abdominal tenderness on my examination.  Given patient's history of abrupt cessation of opioids, most likely differential at this point in time is withdrawals.  However given patient's GI symptoms as well as abdominal tenderness on examination we will also obtain a CT scan to rule out other intra-abdominal pathology such as pancreatitis, diverticulitis, perforated viscus, cholecystitis.  We did offer patient buprenorphine  here in the emergency department for potential withdrawal symptoms however patient adamantly declined at this point in time.  We will  provide clonidine  as well as Ativan  instead for further management of her current symptoms.  At the time of signout further disposition pending imaging evaluation and clinical reassessment.   Final diagnoses:  None    ED Discharge Orders     None          Laurita Sieving, MD 05/24/24 0130    Doretha Folks, MD 05/24/24 1328  "

## 2024-05-24 ENCOUNTER — Emergency Department (HOSPITAL_COMMUNITY)

## 2024-05-24 MED ORDER — ONDANSETRON 4 MG PO TBDP: 4.0000 mg | ORAL_TABLET | Freq: Three times a day (TID) | ORAL | 0 refills | Status: AC | PRN

## 2024-05-24 MED ORDER — CLONIDINE HCL 0.2 MG PO TABS: 0.2000 mg | ORAL_TABLET | Freq: Two times a day (BID) | ORAL | 0 refills | Status: AC

## 2024-05-24 MED ORDER — IOHEXOL 350 MG/ML SOLN
75.0000 mL | Freq: Once | INTRAVENOUS | Status: AC | PRN
  Administered 2024-05-24: 75 mL via INTRAVENOUS

## 2024-05-24 NOTE — ED Provider Notes (Signed)
 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  Feeling better after treatment in the ED.  Looks well.  Will discharge home.  Rx for clonidine .   Vicky Charleston, PA-C 05/24/24 0230    Griselda Norris, MD 05/24/24 [phone]

## 2024-05-24 NOTE — ED Notes (Signed)
 Verneda Eth (Daughter) would like an update on patient status [phone], thank you. Second time calling

## 2024-05-26 NOTE — Progress Notes (Signed)
 Karen Stein                                          MRN: [id number]   05/26/2024   The [facility] Quality Team Specialist reviewed this patient medical record for the purposes of chart review for care gap closure. The following were reviewed: chart review for care gap closure-glycemic status assessment.    [facility] Quality Team

## 2024-05-28 NOTE — Progress Notes (Signed)
 Karen Stein                                          MRN: [id number]   05/28/2024   The [facility] Quality Team Specialist reviewed this patient medical record for the purposes of chart review for care gap closure. The following were reviewed: chart review for care gap closure-glycemic status assessment.    [facility] Quality Team
